# Patient Record
Sex: Female | Born: 1975
Health system: Southern US, Community
[De-identification: ages and names within clinical notes are randomized; demographics above are authoritative.]

## PROBLEM LIST (undated history)

## (undated) ENCOUNTER — Emergency Department (HOSPITAL_COMMUNITY): Admission: EM | Payer: No Typology Code available for payment source | Source: Home / Self Care

## (undated) DIAGNOSIS — I10 Essential (primary) hypertension: Secondary | ICD-10-CM

## (undated) DIAGNOSIS — E119 Type 2 diabetes mellitus without complications: Secondary | ICD-10-CM

## (undated) DIAGNOSIS — J449 Chronic obstructive pulmonary disease, unspecified: Secondary | ICD-10-CM

## (undated) DIAGNOSIS — K219 Gastro-esophageal reflux disease without esophagitis: Secondary | ICD-10-CM

## (undated) DIAGNOSIS — F419 Anxiety disorder, unspecified: Secondary | ICD-10-CM

## (undated) DIAGNOSIS — F32A Depression, unspecified: Secondary | ICD-10-CM

## (undated) DIAGNOSIS — F329 Major depressive disorder, single episode, unspecified: Secondary | ICD-10-CM

## (undated) HISTORY — PX: TUBAL LIGATION: SHX77

---

## 1997-04-22 ENCOUNTER — Ambulatory Visit (HOSPITAL_COMMUNITY): Admission: RE | Admit: 1997-04-22 | Discharge: 1997-04-22 | Payer: Self-pay | Admitting: *Deleted

## 1997-04-23 ENCOUNTER — Inpatient Hospital Stay (HOSPITAL_COMMUNITY): Admission: AD | Admit: 1997-04-23 | Discharge: 1997-04-23 | Payer: Self-pay | Admitting: *Deleted

## 1997-04-28 ENCOUNTER — Inpatient Hospital Stay (HOSPITAL_COMMUNITY): Admission: AD | Admit: 1997-04-28 | Discharge: 1997-04-28 | Payer: Self-pay | Admitting: *Deleted

## 1997-06-11 ENCOUNTER — Ambulatory Visit (HOSPITAL_COMMUNITY): Admission: RE | Admit: 1997-06-11 | Discharge: 1997-06-11 | Payer: Self-pay | Admitting: *Deleted

## 1997-10-09 ENCOUNTER — Inpatient Hospital Stay (HOSPITAL_COMMUNITY): Admission: AD | Admit: 1997-10-09 | Discharge: 1997-10-11 | Payer: Self-pay | Admitting: *Deleted

## 1998-03-11 ENCOUNTER — Emergency Department (HOSPITAL_COMMUNITY): Admission: EM | Admit: 1998-03-11 | Discharge: 1998-03-11 | Payer: Self-pay | Admitting: Emergency Medicine

## 1998-12-07 ENCOUNTER — Inpatient Hospital Stay (HOSPITAL_COMMUNITY): Admission: AD | Admit: 1998-12-07 | Discharge: 1998-12-07 | Payer: Self-pay | Admitting: Obstetrics

## 1998-12-08 ENCOUNTER — Inpatient Hospital Stay (HOSPITAL_COMMUNITY): Admission: RE | Admit: 1998-12-08 | Discharge: 1998-12-08 | Payer: Self-pay | Admitting: Obstetrics

## 1998-12-15 ENCOUNTER — Inpatient Hospital Stay (HOSPITAL_COMMUNITY): Admission: RE | Admit: 1998-12-15 | Discharge: 1998-12-15 | Payer: Self-pay | Admitting: *Deleted

## 1999-01-02 ENCOUNTER — Inpatient Hospital Stay (HOSPITAL_COMMUNITY): Admission: AD | Admit: 1999-01-02 | Discharge: 1999-01-02 | Payer: Self-pay | Admitting: Obstetrics

## 1999-04-01 ENCOUNTER — Ambulatory Visit (HOSPITAL_COMMUNITY): Admission: RE | Admit: 1999-04-01 | Discharge: 1999-04-01 | Payer: Self-pay | Admitting: *Deleted

## 1999-05-19 ENCOUNTER — Ambulatory Visit (HOSPITAL_COMMUNITY): Admission: RE | Admit: 1999-05-19 | Discharge: 1999-05-19 | Payer: Self-pay | Admitting: *Deleted

## 1999-05-29 ENCOUNTER — Inpatient Hospital Stay (HOSPITAL_COMMUNITY): Admission: AD | Admit: 1999-05-29 | Discharge: 1999-05-29 | Payer: Self-pay | Admitting: *Deleted

## 1999-06-05 ENCOUNTER — Encounter (INDEPENDENT_AMBULATORY_CARE_PROVIDER_SITE_OTHER): Payer: Self-pay | Admitting: Specialist

## 1999-06-05 ENCOUNTER — Inpatient Hospital Stay (HOSPITAL_COMMUNITY): Admission: AD | Admit: 1999-06-05 | Discharge: 1999-06-08 | Payer: Self-pay | Admitting: *Deleted

## 1999-06-10 ENCOUNTER — Inpatient Hospital Stay (HOSPITAL_COMMUNITY): Admission: EM | Admit: 1999-06-10 | Discharge: 1999-06-10 | Payer: Self-pay | Admitting: Obstetrics & Gynecology

## 1999-06-14 ENCOUNTER — Inpatient Hospital Stay (HOSPITAL_COMMUNITY): Admission: AD | Admit: 1999-06-14 | Discharge: 1999-06-14 | Payer: Self-pay | Admitting: Obstetrics

## 1999-09-27 ENCOUNTER — Inpatient Hospital Stay (HOSPITAL_COMMUNITY): Admission: EM | Admit: 1999-09-27 | Discharge: 1999-10-01 | Payer: Self-pay | Admitting: Obstetrics

## 1999-09-27 ENCOUNTER — Encounter: Payer: Self-pay | Admitting: Obstetrics

## 2000-03-21 ENCOUNTER — Inpatient Hospital Stay (HOSPITAL_COMMUNITY): Admission: AD | Admit: 2000-03-21 | Discharge: 2000-03-27 | Payer: Self-pay | Admitting: *Deleted

## 2000-03-21 ENCOUNTER — Encounter: Payer: Self-pay | Admitting: *Deleted

## 2000-06-14 ENCOUNTER — Emergency Department (HOSPITAL_COMMUNITY): Admission: EM | Admit: 2000-06-14 | Discharge: 2000-06-14 | Payer: Self-pay | Admitting: Emergency Medicine

## 2000-06-16 ENCOUNTER — Emergency Department (HOSPITAL_COMMUNITY): Admission: EM | Admit: 2000-06-16 | Discharge: 2000-06-16 | Payer: Self-pay | Admitting: Emergency Medicine

## 2000-06-26 ENCOUNTER — Emergency Department (HOSPITAL_COMMUNITY): Admission: EM | Admit: 2000-06-26 | Discharge: 2000-06-26 | Payer: Self-pay | Admitting: Emergency Medicine

## 2000-06-28 ENCOUNTER — Emergency Department (HOSPITAL_COMMUNITY): Admission: EM | Admit: 2000-06-28 | Discharge: 2000-06-28 | Payer: Self-pay | Admitting: Emergency Medicine

## 2000-10-05 ENCOUNTER — Inpatient Hospital Stay (HOSPITAL_COMMUNITY): Admission: AD | Admit: 2000-10-05 | Discharge: 2000-10-05 | Payer: Self-pay | Admitting: Obstetrics & Gynecology

## 2000-10-07 ENCOUNTER — Inpatient Hospital Stay (HOSPITAL_COMMUNITY): Admission: AD | Admit: 2000-10-07 | Discharge: 2000-10-07 | Payer: Self-pay | Admitting: *Deleted

## 2001-03-04 ENCOUNTER — Emergency Department (HOSPITAL_COMMUNITY): Admission: EM | Admit: 2001-03-04 | Discharge: 2001-03-04 | Payer: Self-pay | Admitting: Emergency Medicine

## 2001-06-19 ENCOUNTER — Emergency Department (HOSPITAL_COMMUNITY): Admission: EM | Admit: 2001-06-19 | Discharge: 2001-06-19 | Payer: Self-pay | Admitting: Emergency Medicine

## 2001-07-01 ENCOUNTER — Emergency Department (HOSPITAL_COMMUNITY): Admission: EM | Admit: 2001-07-01 | Discharge: 2001-07-01 | Payer: Self-pay | Admitting: Emergency Medicine

## 2001-08-27 ENCOUNTER — Emergency Department (HOSPITAL_COMMUNITY): Admission: EM | Admit: 2001-08-27 | Discharge: 2001-08-27 | Payer: Self-pay | Admitting: Emergency Medicine

## 2001-08-29 ENCOUNTER — Emergency Department (HOSPITAL_COMMUNITY): Admission: EM | Admit: 2001-08-29 | Discharge: 2001-08-29 | Payer: Self-pay | Admitting: Emergency Medicine

## 2001-09-22 ENCOUNTER — Emergency Department (HOSPITAL_COMMUNITY): Admission: EM | Admit: 2001-09-22 | Discharge: 2001-09-22 | Payer: Self-pay | Admitting: Emergency Medicine

## 2001-09-22 ENCOUNTER — Encounter: Payer: Self-pay | Admitting: Emergency Medicine

## 2001-10-28 ENCOUNTER — Encounter: Payer: Self-pay | Admitting: Emergency Medicine

## 2001-10-28 ENCOUNTER — Emergency Department (HOSPITAL_COMMUNITY): Admission: EM | Admit: 2001-10-28 | Discharge: 2001-10-28 | Payer: Self-pay | Admitting: Emergency Medicine

## 2001-12-10 ENCOUNTER — Emergency Department (HOSPITAL_COMMUNITY): Admission: EM | Admit: 2001-12-10 | Discharge: 2001-12-10 | Payer: Self-pay | Admitting: Emergency Medicine

## 2001-12-10 ENCOUNTER — Encounter: Payer: Self-pay | Admitting: Emergency Medicine

## 2002-01-19 ENCOUNTER — Emergency Department (HOSPITAL_COMMUNITY): Admission: EM | Admit: 2002-01-19 | Discharge: 2002-01-19 | Payer: Self-pay | Admitting: Emergency Medicine

## 2002-02-18 ENCOUNTER — Emergency Department (HOSPITAL_COMMUNITY): Admission: EM | Admit: 2002-02-18 | Discharge: 2002-02-18 | Payer: Self-pay | Admitting: Emergency Medicine

## 2002-02-28 ENCOUNTER — Inpatient Hospital Stay (HOSPITAL_COMMUNITY): Admission: AD | Admit: 2002-02-28 | Discharge: 2002-02-28 | Payer: Self-pay | Admitting: *Deleted

## 2002-03-12 ENCOUNTER — Encounter: Admission: RE | Admit: 2002-03-12 | Discharge: 2002-03-12 | Payer: Self-pay | Admitting: Family Medicine

## 2002-03-30 ENCOUNTER — Encounter: Payer: Self-pay | Admitting: *Deleted

## 2002-03-30 ENCOUNTER — Inpatient Hospital Stay (HOSPITAL_COMMUNITY): Admission: AD | Admit: 2002-03-30 | Discharge: 2002-03-30 | Payer: Self-pay | Admitting: *Deleted

## 2002-05-03 ENCOUNTER — Ambulatory Visit (HOSPITAL_COMMUNITY): Admission: RE | Admit: 2002-05-03 | Discharge: 2002-05-03 | Payer: Self-pay | Admitting: *Deleted

## 2002-05-06 ENCOUNTER — Inpatient Hospital Stay (HOSPITAL_COMMUNITY): Admission: AD | Admit: 2002-05-06 | Discharge: 2002-05-06 | Payer: Self-pay | Admitting: *Deleted

## 2002-05-30 ENCOUNTER — Encounter: Admission: RE | Admit: 2002-05-30 | Discharge: 2002-05-30 | Payer: Self-pay | Admitting: *Deleted

## 2002-05-30 ENCOUNTER — Ambulatory Visit (HOSPITAL_COMMUNITY): Admission: RE | Admit: 2002-05-30 | Discharge: 2002-05-30 | Payer: Self-pay | Admitting: *Deleted

## 2002-06-06 ENCOUNTER — Encounter: Admission: RE | Admit: 2002-06-06 | Discharge: 2002-06-06 | Payer: Self-pay | Admitting: *Deleted

## 2002-06-07 ENCOUNTER — Inpatient Hospital Stay (HOSPITAL_COMMUNITY): Admission: AD | Admit: 2002-06-07 | Discharge: 2002-06-07 | Payer: Self-pay | Admitting: Obstetrics and Gynecology

## 2002-06-12 ENCOUNTER — Inpatient Hospital Stay (HOSPITAL_COMMUNITY): Admission: AD | Admit: 2002-06-12 | Discharge: 2002-06-12 | Payer: Self-pay | Admitting: Obstetrics and Gynecology

## 2002-06-14 ENCOUNTER — Encounter: Payer: Self-pay | Admitting: Family Medicine

## 2002-06-14 ENCOUNTER — Inpatient Hospital Stay (HOSPITAL_COMMUNITY): Admission: AD | Admit: 2002-06-14 | Discharge: 2002-06-16 | Payer: Self-pay | Admitting: Family Medicine

## 2002-06-27 ENCOUNTER — Encounter: Admission: RE | Admit: 2002-06-27 | Discharge: 2002-06-27 | Payer: Self-pay | Admitting: *Deleted

## 2002-07-08 ENCOUNTER — Emergency Department (HOSPITAL_COMMUNITY): Admission: EM | Admit: 2002-07-08 | Discharge: 2002-07-08 | Payer: Self-pay | Admitting: Emergency Medicine

## 2002-07-18 ENCOUNTER — Encounter: Admission: RE | Admit: 2002-07-18 | Discharge: 2002-07-18 | Payer: Self-pay | Admitting: Family Medicine

## 2002-07-24 ENCOUNTER — Inpatient Hospital Stay (HOSPITAL_COMMUNITY): Admission: AD | Admit: 2002-07-24 | Discharge: 2002-07-24 | Payer: Self-pay | Admitting: Obstetrics and Gynecology

## 2002-07-25 ENCOUNTER — Ambulatory Visit (HOSPITAL_COMMUNITY): Admission: RE | Admit: 2002-07-25 | Discharge: 2002-07-25 | Payer: Self-pay | Admitting: *Deleted

## 2002-08-08 ENCOUNTER — Encounter: Admission: RE | Admit: 2002-08-08 | Discharge: 2002-08-08 | Payer: Self-pay | Admitting: Family Medicine

## 2002-08-27 ENCOUNTER — Inpatient Hospital Stay (HOSPITAL_COMMUNITY): Admission: AD | Admit: 2002-08-27 | Discharge: 2002-08-27 | Payer: Self-pay | Admitting: *Deleted

## 2002-08-28 ENCOUNTER — Encounter: Admission: RE | Admit: 2002-08-28 | Discharge: 2002-08-28 | Payer: Self-pay | Admitting: *Deleted

## 2002-09-01 ENCOUNTER — Inpatient Hospital Stay (HOSPITAL_COMMUNITY): Admission: AD | Admit: 2002-09-01 | Discharge: 2002-09-01 | Payer: Self-pay | Admitting: Obstetrics & Gynecology

## 2002-09-11 ENCOUNTER — Encounter: Admission: RE | Admit: 2002-09-11 | Discharge: 2002-09-11 | Payer: Self-pay | Admitting: *Deleted

## 2002-09-18 ENCOUNTER — Encounter: Admission: RE | Admit: 2002-09-18 | Discharge: 2002-09-18 | Payer: Self-pay | Admitting: *Deleted

## 2002-09-18 ENCOUNTER — Inpatient Hospital Stay (HOSPITAL_COMMUNITY): Admission: AD | Admit: 2002-09-18 | Discharge: 2002-09-18 | Payer: Self-pay | Admitting: Obstetrics and Gynecology

## 2002-09-21 ENCOUNTER — Encounter (INDEPENDENT_AMBULATORY_CARE_PROVIDER_SITE_OTHER): Payer: Self-pay | Admitting: Specialist

## 2002-09-21 ENCOUNTER — Inpatient Hospital Stay (HOSPITAL_COMMUNITY): Admission: AD | Admit: 2002-09-21 | Discharge: 2002-09-24 | Payer: Self-pay | Admitting: Obstetrics and Gynecology

## 2002-09-25 ENCOUNTER — Inpatient Hospital Stay (HOSPITAL_COMMUNITY): Admission: AD | Admit: 2002-09-25 | Discharge: 2002-09-25 | Payer: Self-pay | Admitting: *Deleted

## 2002-09-25 ENCOUNTER — Inpatient Hospital Stay (HOSPITAL_COMMUNITY): Admission: AD | Admit: 2002-09-25 | Discharge: 2002-09-25 | Payer: Self-pay | Admitting: Obstetrics and Gynecology

## 2002-09-26 ENCOUNTER — Inpatient Hospital Stay (HOSPITAL_COMMUNITY): Admission: AD | Admit: 2002-09-26 | Discharge: 2002-09-26 | Payer: Self-pay | Admitting: Obstetrics & Gynecology

## 2002-09-28 ENCOUNTER — Encounter: Payer: Self-pay | Admitting: *Deleted

## 2002-09-28 ENCOUNTER — Inpatient Hospital Stay (HOSPITAL_COMMUNITY): Admission: AD | Admit: 2002-09-28 | Discharge: 2002-10-02 | Payer: Self-pay | Admitting: *Deleted

## 2002-10-05 ENCOUNTER — Inpatient Hospital Stay (HOSPITAL_COMMUNITY): Admission: AD | Admit: 2002-10-05 | Discharge: 2002-10-05 | Payer: Self-pay | Admitting: Obstetrics & Gynecology

## 2002-11-02 ENCOUNTER — Encounter: Payer: Self-pay | Admitting: General Surgery

## 2002-11-02 ENCOUNTER — Emergency Department (HOSPITAL_COMMUNITY): Admission: EM | Admit: 2002-11-02 | Discharge: 2002-11-02 | Payer: Self-pay | Admitting: Emergency Medicine

## 2002-11-02 ENCOUNTER — Encounter: Payer: Self-pay | Admitting: *Deleted

## 2002-12-16 ENCOUNTER — Emergency Department (HOSPITAL_COMMUNITY): Admission: AD | Admit: 2002-12-16 | Discharge: 2002-12-16 | Payer: Self-pay | Admitting: Family Medicine

## 2003-04-23 ENCOUNTER — Emergency Department (HOSPITAL_COMMUNITY): Admission: EM | Admit: 2003-04-23 | Discharge: 2003-04-23 | Payer: Self-pay | Admitting: Family Medicine

## 2003-05-23 ENCOUNTER — Emergency Department (HOSPITAL_COMMUNITY): Admission: EM | Admit: 2003-05-23 | Discharge: 2003-05-23 | Payer: Self-pay | Admitting: *Deleted

## 2003-06-25 ENCOUNTER — Emergency Department (HOSPITAL_COMMUNITY): Admission: EM | Admit: 2003-06-25 | Discharge: 2003-06-25 | Payer: Self-pay | Admitting: Emergency Medicine

## 2003-07-09 ENCOUNTER — Inpatient Hospital Stay (HOSPITAL_COMMUNITY): Admission: AD | Admit: 2003-07-09 | Discharge: 2003-07-09 | Payer: Self-pay | Admitting: Obstetrics and Gynecology

## 2003-07-30 ENCOUNTER — Ambulatory Visit (HOSPITAL_COMMUNITY): Admission: RE | Admit: 2003-07-30 | Discharge: 2003-07-30 | Payer: Self-pay | Admitting: Obstetrics

## 2003-08-09 ENCOUNTER — Emergency Department (HOSPITAL_COMMUNITY): Admission: EM | Admit: 2003-08-09 | Discharge: 2003-08-09 | Payer: Self-pay | Admitting: Family Medicine

## 2003-08-12 ENCOUNTER — Emergency Department (HOSPITAL_COMMUNITY): Admission: EM | Admit: 2003-08-12 | Discharge: 2003-08-12 | Payer: Self-pay | Admitting: Family Medicine

## 2003-08-20 ENCOUNTER — Emergency Department (HOSPITAL_COMMUNITY): Admission: EM | Admit: 2003-08-20 | Discharge: 2003-08-20 | Payer: Self-pay | Admitting: Family Medicine

## 2003-09-01 ENCOUNTER — Inpatient Hospital Stay (HOSPITAL_COMMUNITY): Admission: AD | Admit: 2003-09-01 | Discharge: 2003-09-02 | Payer: Self-pay | Admitting: Obstetrics

## 2003-10-13 ENCOUNTER — Encounter (INDEPENDENT_AMBULATORY_CARE_PROVIDER_SITE_OTHER): Payer: Self-pay | Admitting: *Deleted

## 2003-10-23 ENCOUNTER — Encounter: Admission: RE | Admit: 2003-10-23 | Discharge: 2003-10-23 | Payer: Self-pay | Admitting: Sports Medicine

## 2003-10-23 ENCOUNTER — Other Ambulatory Visit: Admission: RE | Admit: 2003-10-23 | Discharge: 2003-10-23 | Payer: Self-pay | Admitting: Family Medicine

## 2003-11-20 ENCOUNTER — Ambulatory Visit: Payer: Self-pay | Admitting: Family Medicine

## 2003-11-27 ENCOUNTER — Emergency Department (HOSPITAL_COMMUNITY): Admission: EM | Admit: 2003-11-27 | Discharge: 2003-11-27 | Payer: Self-pay | Admitting: Family Medicine

## 2004-01-28 ENCOUNTER — Emergency Department (HOSPITAL_COMMUNITY): Admission: EM | Admit: 2004-01-28 | Discharge: 2004-01-28 | Payer: Self-pay | Admitting: Family Medicine

## 2004-02-16 ENCOUNTER — Ambulatory Visit: Payer: Self-pay | Admitting: Family Medicine

## 2004-04-05 ENCOUNTER — Inpatient Hospital Stay (HOSPITAL_COMMUNITY): Admission: AD | Admit: 2004-04-05 | Discharge: 2004-04-05 | Payer: Self-pay | Admitting: Obstetrics & Gynecology

## 2004-05-24 ENCOUNTER — Ambulatory Visit: Payer: Self-pay | Admitting: Family Medicine

## 2004-07-02 ENCOUNTER — Ambulatory Visit: Payer: Self-pay | Admitting: Family Medicine

## 2004-07-21 ENCOUNTER — Emergency Department (HOSPITAL_COMMUNITY): Admission: EM | Admit: 2004-07-21 | Discharge: 2004-07-21 | Payer: Self-pay | Admitting: Emergency Medicine

## 2004-09-02 ENCOUNTER — Emergency Department (HOSPITAL_COMMUNITY): Admission: EM | Admit: 2004-09-02 | Discharge: 2004-09-02 | Payer: Self-pay | Admitting: Emergency Medicine

## 2004-10-06 ENCOUNTER — Ambulatory Visit: Payer: Self-pay | Admitting: Sports Medicine

## 2004-10-11 ENCOUNTER — Emergency Department (HOSPITAL_COMMUNITY): Admission: EM | Admit: 2004-10-11 | Discharge: 2004-10-11 | Payer: Self-pay | Admitting: Family Medicine

## 2004-10-14 ENCOUNTER — Emergency Department (HOSPITAL_COMMUNITY): Admission: EM | Admit: 2004-10-14 | Discharge: 2004-10-14 | Payer: Self-pay | Admitting: Emergency Medicine

## 2004-10-28 ENCOUNTER — Emergency Department (HOSPITAL_COMMUNITY): Admission: EM | Admit: 2004-10-28 | Discharge: 2004-10-28 | Payer: Self-pay | Admitting: Family Medicine

## 2004-12-02 ENCOUNTER — Ambulatory Visit: Payer: Self-pay | Admitting: Family Medicine

## 2004-12-06 ENCOUNTER — Ambulatory Visit: Payer: Self-pay | Admitting: Family Medicine

## 2005-01-14 ENCOUNTER — Ambulatory Visit: Payer: Self-pay | Admitting: Family Medicine

## 2005-02-21 ENCOUNTER — Ambulatory Visit: Payer: Self-pay | Admitting: Sports Medicine

## 2005-04-01 ENCOUNTER — Ambulatory Visit: Payer: Self-pay | Admitting: Family Medicine

## 2005-05-02 ENCOUNTER — Ambulatory Visit: Payer: Self-pay | Admitting: Family Medicine

## 2005-05-04 ENCOUNTER — Ambulatory Visit: Payer: Self-pay | Admitting: Sports Medicine

## 2005-06-24 ENCOUNTER — Emergency Department (HOSPITAL_COMMUNITY): Admission: EM | Admit: 2005-06-24 | Discharge: 2005-06-24 | Payer: Self-pay | Admitting: Family Medicine

## 2005-07-26 ENCOUNTER — Ambulatory Visit: Payer: Self-pay | Admitting: Sports Medicine

## 2005-07-28 ENCOUNTER — Inpatient Hospital Stay (HOSPITAL_COMMUNITY): Admission: AD | Admit: 2005-07-28 | Discharge: 2005-07-28 | Payer: Self-pay | Admitting: Family Medicine

## 2005-08-10 ENCOUNTER — Ambulatory Visit: Payer: Self-pay | Admitting: Family Medicine

## 2005-08-26 ENCOUNTER — Emergency Department (HOSPITAL_COMMUNITY): Admission: EM | Admit: 2005-08-26 | Discharge: 2005-08-26 | Payer: Self-pay | Admitting: Family Medicine

## 2005-10-20 ENCOUNTER — Emergency Department (HOSPITAL_COMMUNITY): Admission: EM | Admit: 2005-10-20 | Discharge: 2005-10-20 | Payer: Self-pay | Admitting: Family Medicine

## 2005-11-22 ENCOUNTER — Ambulatory Visit: Payer: Self-pay | Admitting: Family Medicine

## 2005-12-02 ENCOUNTER — Ambulatory Visit: Payer: Self-pay | Admitting: Family Medicine

## 2005-12-28 ENCOUNTER — Inpatient Hospital Stay (HOSPITAL_COMMUNITY): Admission: AD | Admit: 2005-12-28 | Discharge: 2005-12-28 | Payer: Self-pay | Admitting: Gynecology

## 2006-05-02 ENCOUNTER — Ambulatory Visit: Payer: Self-pay | Admitting: Family Medicine

## 2006-05-11 DIAGNOSIS — N946 Dysmenorrhea, unspecified: Secondary | ICD-10-CM | POA: Insufficient documentation

## 2006-05-11 DIAGNOSIS — K219 Gastro-esophageal reflux disease without esophagitis: Secondary | ICD-10-CM

## 2006-05-12 ENCOUNTER — Encounter (INDEPENDENT_AMBULATORY_CARE_PROVIDER_SITE_OTHER): Payer: Self-pay | Admitting: *Deleted

## 2006-05-20 ENCOUNTER — Emergency Department (HOSPITAL_COMMUNITY): Admission: EM | Admit: 2006-05-20 | Discharge: 2006-05-20 | Payer: Self-pay | Admitting: Emergency Medicine

## 2006-06-09 ENCOUNTER — Emergency Department (HOSPITAL_COMMUNITY): Admission: EM | Admit: 2006-06-09 | Discharge: 2006-06-09 | Payer: Self-pay | Admitting: Emergency Medicine

## 2006-07-17 ENCOUNTER — Emergency Department (HOSPITAL_COMMUNITY): Admission: EM | Admit: 2006-07-17 | Discharge: 2006-07-17 | Payer: Self-pay | Admitting: Emergency Medicine

## 2006-10-06 ENCOUNTER — Telehealth: Payer: Self-pay | Admitting: *Deleted

## 2006-10-09 ENCOUNTER — Ambulatory Visit: Payer: Self-pay | Admitting: Family Medicine

## 2006-10-09 DIAGNOSIS — G43909 Migraine, unspecified, not intractable, without status migrainosus: Secondary | ICD-10-CM | POA: Insufficient documentation

## 2006-10-15 ENCOUNTER — Emergency Department (HOSPITAL_COMMUNITY): Admission: EM | Admit: 2006-10-15 | Discharge: 2006-10-15 | Payer: Self-pay | Admitting: Emergency Medicine

## 2006-11-08 ENCOUNTER — Encounter: Payer: Self-pay | Admitting: Family Medicine

## 2006-11-08 ENCOUNTER — Ambulatory Visit: Payer: Self-pay | Admitting: Family Medicine

## 2006-11-08 DIAGNOSIS — D259 Leiomyoma of uterus, unspecified: Secondary | ICD-10-CM | POA: Insufficient documentation

## 2006-11-08 DIAGNOSIS — R5381 Other malaise: Secondary | ICD-10-CM | POA: Insufficient documentation

## 2006-11-08 DIAGNOSIS — R5383 Other fatigue: Secondary | ICD-10-CM

## 2006-11-08 LAB — CONVERTED CEMR LAB
Chlamydia, DNA Probe: NEGATIVE
RBC: 4.19 M/uL (ref 3.87–5.11)
WBC: 8.2 10*3/uL (ref 4.0–10.5)

## 2006-11-09 ENCOUNTER — Telehealth: Payer: Self-pay | Admitting: *Deleted

## 2006-11-10 ENCOUNTER — Emergency Department (HOSPITAL_COMMUNITY): Admission: EM | Admit: 2006-11-10 | Discharge: 2006-11-10 | Payer: Self-pay | Admitting: Family Medicine

## 2006-11-13 ENCOUNTER — Emergency Department (HOSPITAL_COMMUNITY): Admission: EM | Admit: 2006-11-13 | Discharge: 2006-11-13 | Payer: Self-pay | Admitting: Emergency Medicine

## 2006-11-15 ENCOUNTER — Encounter (INDEPENDENT_AMBULATORY_CARE_PROVIDER_SITE_OTHER): Payer: Self-pay | Admitting: *Deleted

## 2006-11-15 ENCOUNTER — Ambulatory Visit: Payer: Self-pay | Admitting: Family Medicine

## 2006-11-15 ENCOUNTER — Telehealth (INDEPENDENT_AMBULATORY_CARE_PROVIDER_SITE_OTHER): Payer: Self-pay | Admitting: *Deleted

## 2006-11-15 DIAGNOSIS — K5900 Constipation, unspecified: Secondary | ICD-10-CM | POA: Insufficient documentation

## 2006-11-15 DIAGNOSIS — F411 Generalized anxiety disorder: Secondary | ICD-10-CM | POA: Insufficient documentation

## 2006-11-15 DIAGNOSIS — R42 Dizziness and giddiness: Secondary | ICD-10-CM | POA: Insufficient documentation

## 2006-11-20 ENCOUNTER — Ambulatory Visit (HOSPITAL_COMMUNITY): Admission: RE | Admit: 2006-11-20 | Discharge: 2006-11-20 | Payer: Self-pay | Admitting: Family Medicine

## 2006-11-21 ENCOUNTER — Encounter (INDEPENDENT_AMBULATORY_CARE_PROVIDER_SITE_OTHER): Payer: Self-pay | Admitting: *Deleted

## 2006-11-21 ENCOUNTER — Telehealth (INDEPENDENT_AMBULATORY_CARE_PROVIDER_SITE_OTHER): Payer: Self-pay | Admitting: *Deleted

## 2006-11-21 LAB — CONVERTED CEMR LAB
Eosinophils Absolute: 0.1 10*3/uL (ref 0.0–0.7)
Eosinophils Relative: 2 % (ref 0–5)
HCT: 39.2 % (ref 36.0–46.0)
Hemoglobin: 12.9 g/dL (ref 12.0–15.0)
Lymphs Abs: 3.1 10*3/uL (ref 0.7–3.3)
MCV: 97.3 fL (ref 78.0–100.0)
Monocytes Relative: 8 % (ref 3–11)
RBC: 4.03 M/uL (ref 3.87–5.11)
WBC: 6.8 10*3/uL (ref 4.0–10.5)

## 2006-11-23 ENCOUNTER — Ambulatory Visit: Payer: Self-pay | Admitting: Family Medicine

## 2006-12-01 ENCOUNTER — Telehealth (INDEPENDENT_AMBULATORY_CARE_PROVIDER_SITE_OTHER): Payer: Self-pay | Admitting: Family Medicine

## 2006-12-04 ENCOUNTER — Encounter: Payer: Self-pay | Admitting: Family Medicine

## 2006-12-18 ENCOUNTER — Ambulatory Visit: Payer: Self-pay | Admitting: Sports Medicine

## 2007-01-19 ENCOUNTER — Ambulatory Visit: Payer: Self-pay | Admitting: Family Medicine

## 2007-01-19 LAB — CONVERTED CEMR LAB
Mucus, UA: 0
Protein, U semiquant: NEGATIVE
Specific Gravity, Urine: 1.015
Urobilinogen, UA: NEGATIVE
WBC Urine, dipstick: NEGATIVE

## 2007-01-23 ENCOUNTER — Telehealth (INDEPENDENT_AMBULATORY_CARE_PROVIDER_SITE_OTHER): Payer: Self-pay | Admitting: *Deleted

## 2007-01-26 ENCOUNTER — Encounter: Payer: Self-pay | Admitting: *Deleted

## 2007-01-30 ENCOUNTER — Encounter: Payer: Self-pay | Admitting: *Deleted

## 2007-02-06 ENCOUNTER — Encounter: Admission: RE | Admit: 2007-02-06 | Discharge: 2007-03-24 | Payer: Self-pay | Admitting: Family Medicine

## 2007-02-06 ENCOUNTER — Telehealth: Payer: Self-pay | Admitting: Family Medicine

## 2007-02-20 ENCOUNTER — Encounter: Payer: Self-pay | Admitting: *Deleted

## 2007-03-21 ENCOUNTER — Ambulatory Visit: Payer: Self-pay | Admitting: Family Medicine

## 2007-03-23 ENCOUNTER — Ambulatory Visit: Payer: Self-pay | Admitting: Family Medicine

## 2007-07-09 ENCOUNTER — Ambulatory Visit: Payer: Self-pay | Admitting: Family Medicine

## 2007-07-09 ENCOUNTER — Encounter: Payer: Self-pay | Admitting: Family Medicine

## 2007-07-16 ENCOUNTER — Encounter: Payer: Self-pay | Admitting: *Deleted

## 2007-07-16 ENCOUNTER — Ambulatory Visit: Payer: Self-pay | Admitting: Family Medicine

## 2007-07-16 ENCOUNTER — Encounter (INDEPENDENT_AMBULATORY_CARE_PROVIDER_SITE_OTHER): Payer: Self-pay | Admitting: Family Medicine

## 2007-07-17 ENCOUNTER — Telehealth (INDEPENDENT_AMBULATORY_CARE_PROVIDER_SITE_OTHER): Payer: Self-pay | Admitting: Family Medicine

## 2007-07-17 ENCOUNTER — Encounter: Payer: Self-pay | Admitting: Family Medicine

## 2007-07-20 ENCOUNTER — Ambulatory Visit: Payer: Self-pay | Admitting: Family Medicine

## 2007-08-17 ENCOUNTER — Ambulatory Visit: Payer: Self-pay | Admitting: Family Medicine

## 2007-08-17 DIAGNOSIS — F339 Major depressive disorder, recurrent, unspecified: Secondary | ICD-10-CM | POA: Insufficient documentation

## 2007-08-20 ENCOUNTER — Telehealth: Payer: Self-pay | Admitting: Family Medicine

## 2007-08-31 ENCOUNTER — Ambulatory Visit: Payer: Self-pay | Admitting: Family Medicine

## 2007-08-31 ENCOUNTER — Encounter: Payer: Self-pay | Admitting: Family Medicine

## 2007-09-04 ENCOUNTER — Telehealth: Payer: Self-pay | Admitting: Family Medicine

## 2007-09-11 ENCOUNTER — Telehealth: Payer: Self-pay | Admitting: Family Medicine

## 2007-09-17 ENCOUNTER — Encounter: Payer: Self-pay | Admitting: *Deleted

## 2007-09-25 ENCOUNTER — Encounter: Payer: Self-pay | Admitting: Family Medicine

## 2007-09-27 ENCOUNTER — Emergency Department (HOSPITAL_COMMUNITY): Admission: EM | Admit: 2007-09-27 | Discharge: 2007-09-27 | Payer: Self-pay | Admitting: Family Medicine

## 2007-09-28 ENCOUNTER — Encounter: Payer: Self-pay | Admitting: Family Medicine

## 2007-09-28 ENCOUNTER — Ambulatory Visit: Payer: Self-pay | Admitting: Family Medicine

## 2007-09-28 LAB — CONVERTED CEMR LAB
Bilirubin Urine: NEGATIVE
Glucose, Urine, Semiquant: NEGATIVE
Urobilinogen, UA: 0.2

## 2007-10-06 ENCOUNTER — Emergency Department (HOSPITAL_COMMUNITY): Admission: EM | Admit: 2007-10-06 | Discharge: 2007-10-06 | Payer: Self-pay | Admitting: Emergency Medicine

## 2007-10-08 ENCOUNTER — Encounter: Payer: Self-pay | Admitting: Family Medicine

## 2007-10-08 ENCOUNTER — Ambulatory Visit: Payer: Self-pay | Admitting: Family Medicine

## 2007-10-10 ENCOUNTER — Telehealth (INDEPENDENT_AMBULATORY_CARE_PROVIDER_SITE_OTHER): Payer: Self-pay | Admitting: *Deleted

## 2007-10-24 ENCOUNTER — Encounter (INDEPENDENT_AMBULATORY_CARE_PROVIDER_SITE_OTHER): Payer: Self-pay | Admitting: *Deleted

## 2007-11-06 ENCOUNTER — Encounter: Payer: Self-pay | Admitting: Family Medicine

## 2007-11-15 ENCOUNTER — Ambulatory Visit (HOSPITAL_COMMUNITY): Admission: RE | Admit: 2007-11-15 | Discharge: 2007-11-15 | Payer: Self-pay | Admitting: Family Medicine

## 2007-11-15 ENCOUNTER — Ambulatory Visit: Payer: Self-pay | Admitting: Family Medicine

## 2007-11-15 LAB — CONVERTED CEMR LAB
Glucose, Urine, Semiquant: NEGATIVE
Protein, U semiquant: NEGATIVE
Specific Gravity, Urine: 1.025
pH: 7

## 2007-11-16 ENCOUNTER — Telehealth: Payer: Self-pay | Admitting: *Deleted

## 2007-11-21 ENCOUNTER — Encounter: Payer: Self-pay | Admitting: Family Medicine

## 2007-11-21 ENCOUNTER — Emergency Department (HOSPITAL_COMMUNITY): Admission: EM | Admit: 2007-11-21 | Discharge: 2007-11-21 | Payer: Self-pay | Admitting: Emergency Medicine

## 2007-11-27 ENCOUNTER — Emergency Department (HOSPITAL_COMMUNITY): Admission: EM | Admit: 2007-11-27 | Discharge: 2007-11-27 | Payer: Self-pay | Admitting: Internal Medicine

## 2007-12-20 ENCOUNTER — Encounter: Payer: Self-pay | Admitting: *Deleted

## 2007-12-24 ENCOUNTER — Telehealth: Payer: Self-pay | Admitting: *Deleted

## 2007-12-25 ENCOUNTER — Telehealth: Payer: Self-pay | Admitting: *Deleted

## 2007-12-25 ENCOUNTER — Ambulatory Visit: Payer: Self-pay | Admitting: Family Medicine

## 2008-01-01 ENCOUNTER — Ambulatory Visit: Payer: Self-pay | Admitting: Family Medicine

## 2008-01-08 ENCOUNTER — Telehealth: Payer: Self-pay | Admitting: Family Medicine

## 2008-01-09 ENCOUNTER — Encounter (INDEPENDENT_AMBULATORY_CARE_PROVIDER_SITE_OTHER): Payer: Self-pay | Admitting: *Deleted

## 2008-01-17 ENCOUNTER — Other Ambulatory Visit: Admission: RE | Admit: 2008-01-17 | Discharge: 2008-01-17 | Payer: Self-pay | Admitting: Family Medicine

## 2008-01-17 ENCOUNTER — Ambulatory Visit: Payer: Self-pay | Admitting: Family Medicine

## 2008-01-17 ENCOUNTER — Encounter (INDEPENDENT_AMBULATORY_CARE_PROVIDER_SITE_OTHER): Payer: Self-pay | Admitting: Family Medicine

## 2008-01-17 ENCOUNTER — Encounter: Payer: Self-pay | Admitting: Family Medicine

## 2008-03-27 ENCOUNTER — Emergency Department (HOSPITAL_COMMUNITY): Admission: EM | Admit: 2008-03-27 | Discharge: 2008-03-27 | Payer: Self-pay | Admitting: Emergency Medicine

## 2008-04-09 ENCOUNTER — Emergency Department (HOSPITAL_COMMUNITY): Admission: EM | Admit: 2008-04-09 | Discharge: 2008-04-10 | Payer: Self-pay | Admitting: Emergency Medicine

## 2008-04-29 ENCOUNTER — Ambulatory Visit: Payer: Self-pay | Admitting: Family Medicine

## 2008-05-01 ENCOUNTER — Ambulatory Visit: Payer: Self-pay | Admitting: Family Medicine

## 2008-05-05 ENCOUNTER — Telehealth: Payer: Self-pay | Admitting: Family Medicine

## 2008-05-05 ENCOUNTER — Ambulatory Visit: Payer: Self-pay | Admitting: Family Medicine

## 2008-05-21 ENCOUNTER — Telehealth: Payer: Self-pay | Admitting: Family Medicine

## 2008-05-21 ENCOUNTER — Ambulatory Visit: Payer: Self-pay | Admitting: Family Medicine

## 2008-06-05 ENCOUNTER — Ambulatory Visit: Payer: Self-pay | Admitting: Family Medicine

## 2008-06-05 ENCOUNTER — Encounter: Payer: Self-pay | Admitting: Family Medicine

## 2008-06-05 ENCOUNTER — Ambulatory Visit (HOSPITAL_COMMUNITY): Admission: RE | Admit: 2008-06-05 | Discharge: 2008-06-05 | Payer: Self-pay | Admitting: Family Medicine

## 2008-07-02 ENCOUNTER — Telehealth: Payer: Self-pay | Admitting: Family Medicine

## 2008-07-03 ENCOUNTER — Telehealth: Payer: Self-pay | Admitting: Family Medicine

## 2008-07-03 ENCOUNTER — Encounter: Payer: Self-pay | Admitting: Family Medicine

## 2008-07-03 ENCOUNTER — Ambulatory Visit: Payer: Self-pay | Admitting: Family Medicine

## 2008-07-03 LAB — CONVERTED CEMR LAB

## 2008-07-04 ENCOUNTER — Encounter: Payer: Self-pay | Admitting: Family Medicine

## 2008-07-10 ENCOUNTER — Emergency Department (HOSPITAL_COMMUNITY): Admission: EM | Admit: 2008-07-10 | Discharge: 2008-07-10 | Payer: Self-pay | Admitting: Emergency Medicine

## 2008-07-11 ENCOUNTER — Telehealth: Payer: Self-pay | Admitting: Family Medicine

## 2008-07-14 ENCOUNTER — Ambulatory Visit: Payer: Self-pay | Admitting: Family Medicine

## 2008-08-09 ENCOUNTER — Emergency Department (HOSPITAL_COMMUNITY): Admission: EM | Admit: 2008-08-09 | Discharge: 2008-08-09 | Payer: Self-pay | Admitting: Emergency Medicine

## 2008-08-12 ENCOUNTER — Encounter: Payer: Self-pay | Admitting: Family Medicine

## 2008-08-12 ENCOUNTER — Ambulatory Visit: Payer: Self-pay | Admitting: Family Medicine

## 2008-08-12 DIAGNOSIS — H547 Unspecified visual loss: Secondary | ICD-10-CM

## 2008-08-13 ENCOUNTER — Telehealth: Payer: Self-pay | Admitting: *Deleted

## 2008-09-12 ENCOUNTER — Ambulatory Visit: Payer: Self-pay | Admitting: Family Medicine

## 2008-09-12 ENCOUNTER — Encounter: Payer: Self-pay | Admitting: Family Medicine

## 2008-09-17 ENCOUNTER — Encounter: Payer: Self-pay | Admitting: Family Medicine

## 2008-09-18 ENCOUNTER — Ambulatory Visit: Payer: Self-pay | Admitting: Family Medicine

## 2008-09-18 ENCOUNTER — Encounter: Payer: Self-pay | Admitting: Family Medicine

## 2008-09-18 DIAGNOSIS — A630 Anogenital (venereal) warts: Secondary | ICD-10-CM

## 2008-09-23 ENCOUNTER — Encounter: Payer: Self-pay | Admitting: Family Medicine

## 2008-10-07 ENCOUNTER — Emergency Department (HOSPITAL_COMMUNITY): Admission: EM | Admit: 2008-10-07 | Discharge: 2008-10-07 | Payer: Self-pay | Admitting: Emergency Medicine

## 2008-10-08 ENCOUNTER — Ambulatory Visit: Payer: Self-pay | Admitting: Family Medicine

## 2008-10-08 ENCOUNTER — Telehealth: Payer: Self-pay | Admitting: Family Medicine

## 2008-11-05 ENCOUNTER — Ambulatory Visit: Payer: Self-pay | Admitting: Family Medicine

## 2008-11-05 ENCOUNTER — Encounter: Payer: Self-pay | Admitting: Family Medicine

## 2008-11-05 DIAGNOSIS — F410 Panic disorder [episodic paroxysmal anxiety] without agoraphobia: Secondary | ICD-10-CM

## 2009-01-09 ENCOUNTER — Telehealth: Payer: Self-pay | Admitting: Family Medicine

## 2009-01-10 ENCOUNTER — Emergency Department (HOSPITAL_COMMUNITY): Admission: EM | Admit: 2009-01-10 | Discharge: 2009-01-10 | Payer: Self-pay | Admitting: Emergency Medicine

## 2009-01-26 ENCOUNTER — Telehealth: Payer: Self-pay | Admitting: Family Medicine

## 2009-02-28 ENCOUNTER — Emergency Department (HOSPITAL_COMMUNITY): Admission: EM | Admit: 2009-02-28 | Discharge: 2009-02-28 | Payer: Self-pay | Admitting: Emergency Medicine

## 2009-03-04 ENCOUNTER — Ambulatory Visit: Payer: Self-pay | Admitting: Family Medicine

## 2009-03-16 ENCOUNTER — Encounter (INDEPENDENT_AMBULATORY_CARE_PROVIDER_SITE_OTHER): Payer: Self-pay | Admitting: *Deleted

## 2009-03-16 DIAGNOSIS — F17200 Nicotine dependence, unspecified, uncomplicated: Secondary | ICD-10-CM

## 2009-04-13 ENCOUNTER — Emergency Department (HOSPITAL_COMMUNITY): Admission: EM | Admit: 2009-04-13 | Discharge: 2009-04-13 | Payer: Self-pay | Admitting: Emergency Medicine

## 2009-06-26 ENCOUNTER — Emergency Department (HOSPITAL_COMMUNITY): Admission: EM | Admit: 2009-06-26 | Discharge: 2009-06-26 | Payer: Self-pay | Admitting: Emergency Medicine

## 2009-07-21 ENCOUNTER — Emergency Department (HOSPITAL_COMMUNITY): Admission: EM | Admit: 2009-07-21 | Discharge: 2009-07-21 | Payer: Self-pay | Admitting: Emergency Medicine

## 2009-08-20 ENCOUNTER — Emergency Department (HOSPITAL_COMMUNITY): Admission: EM | Admit: 2009-08-20 | Discharge: 2009-08-20 | Payer: Self-pay | Admitting: Emergency Medicine

## 2009-10-28 ENCOUNTER — Emergency Department (HOSPITAL_COMMUNITY)
Admission: EM | Admit: 2009-10-28 | Discharge: 2009-10-28 | Payer: Self-pay | Source: Home / Self Care | Admitting: Emergency Medicine

## 2009-11-02 ENCOUNTER — Emergency Department (HOSPITAL_COMMUNITY): Admission: EM | Admit: 2009-11-02 | Discharge: 2009-11-02 | Payer: Self-pay | Admitting: Emergency Medicine

## 2010-01-10 ENCOUNTER — Emergency Department (HOSPITAL_COMMUNITY): Admission: EM | Admit: 2010-01-10 | Discharge: 2010-01-10 | Payer: Self-pay | Admitting: Emergency Medicine

## 2010-01-29 ENCOUNTER — Encounter: Payer: Self-pay | Admitting: Family Medicine

## 2010-01-29 ENCOUNTER — Encounter: Admission: RE | Admit: 2010-01-29 | Discharge: 2010-01-29 | Payer: Self-pay | Admitting: Family Medicine

## 2010-01-29 ENCOUNTER — Ambulatory Visit: Payer: Self-pay | Admitting: Family Medicine

## 2010-01-29 DIAGNOSIS — R05 Cough: Secondary | ICD-10-CM

## 2010-01-29 DIAGNOSIS — E663 Overweight: Secondary | ICD-10-CM | POA: Insufficient documentation

## 2010-01-29 LAB — CONVERTED CEMR LAB
ALT: 19 units/L (ref 0–35)
AST: 21 units/L (ref 0–37)
CO2: 26 meq/L (ref 19–32)
Calcium: 9 mg/dL (ref 8.4–10.5)
Chloride: 105 meq/L (ref 96–112)
Cholesterol: 165 mg/dL (ref 0–200)
HCT: 39.3 % (ref 36.0–46.0)
HDL: 69 mg/dL (ref >39)
Platelets: 274 10*3/uL (ref 150–400)
Potassium: 4.4 meq/L (ref 3.5–5.3)
RDW: 14.3 % (ref 11.5–15.5)
Sodium: 137 meq/L (ref 135–145)
TSH: 0.564 microintl units/mL (ref 0.350–4.500)
Total CHOL/HDL Ratio: 2.4
Total Protein: 6.8 g/dL (ref 6.0–8.3)
WBC: 7.6 10*3/uL (ref 4.0–10.5)

## 2010-02-01 ENCOUNTER — Encounter: Payer: Self-pay | Admitting: Family Medicine

## 2010-02-16 ENCOUNTER — Emergency Department (HOSPITAL_COMMUNITY)
Admission: EM | Admit: 2010-02-16 | Discharge: 2010-02-16 | Payer: Self-pay | Source: Home / Self Care | Admitting: Emergency Medicine

## 2010-02-25 ENCOUNTER — Ambulatory Visit: Payer: Self-pay | Admitting: Family Medicine

## 2010-02-25 ENCOUNTER — Encounter: Payer: Self-pay | Admitting: Family Medicine

## 2010-02-25 DIAGNOSIS — R209 Unspecified disturbances of skin sensation: Secondary | ICD-10-CM | POA: Insufficient documentation

## 2010-02-25 DIAGNOSIS — IMO0001 Reserved for inherently not codable concepts without codable children: Secondary | ICD-10-CM

## 2010-02-25 LAB — CONVERTED CEMR LAB
Anti Nuclear Antibody(ANA): NEGATIVE
Rhuematoid fact SerPl-aCnc: <20 intl units/mL (ref 0–20)
Vitamin B-12: 359 pg/mL (ref 211–911)

## 2010-02-26 ENCOUNTER — Encounter: Payer: Self-pay | Admitting: Family Medicine

## 2010-03-02 ENCOUNTER — Ambulatory Visit: Payer: Self-pay

## 2010-03-24 ENCOUNTER — Ambulatory Visit: Admit: 2010-03-24 | Payer: Self-pay

## 2010-04-13 NOTE — Letter (Signed)
Summary: Lipid Letter  [facility]  [address]   [place], [place] [address]   Phone: [phone]  Fax: [phone]    02/01/2010  Gina Shelton [address] [place], [place]  [address]  Dear Gina Shelton:  We have carefully reviewed your last lipid profile from 01/29/2010 and the results are noted below with a summary of recommendations for lipid management.    Cholesterol:       165     Goal: <200   HDL "good" Cholesterol:   69     Goal: >40   LDL "bad" Cholesterol:   80     Goal: < 160   Triglycerides:       82     Goal: <150    Your cholesterol levels look good.  We also checked your thyroid (TSH) , liver function tests and kidney function which all looked normal.    TLC Diet (Therapeutic Lifestyle Change): Saturated Fats & Transfatty acids should be kept < 7% of total calories ***Reduce Saturated Fats Polyunstaurated Fat can be up to 10% of total calories Monounsaturated Fat Fat can be up to 20% of total calories Total Fat should be no greater than 25-35% of total calories Carbohydrates should be 50-60% of total calories Protein should be approximately 15% of total calories Fiber should be at least 20-30 grams a day ***Increased fiber may help lower LDL Total Cholesterol should be < 200mg /day Consider adding plant stanol/sterols to diet (example: Benacol spread) ***A higher intake of unsaturated fat may reduce Triglycerides and Increase HDL    Adjunctive Measures (may lower LIPIDS and reduce risk of Heart Attack) include: Aerobic Exercise (20-30 minutes 3-4 times a week) Limit Alcohol Consumption Weight Reduction Dietary Fiber 20-30 grams a day by mouth     Current Medications: 1)    Prilosec 40 Mg  Cpdr (Omeprazole) .... Take 1 tab by mouth daily 2)    Venlafaxine Hcl 75 Mg Tabs (Venlafaxine hcl) .... Take one half tablet twice a day for one week, then one tablet twice a day 3)    Naproxen Sodium 550 Mg Tabs (Naproxen sodium) .... One tablet  twice a day for severe migraine 4)    Proventil Hfa 108 (90 Base) Mcg/act Aers (Albuterol sulfate) .... 2 puffs every 4-6 hours as needed  If you have any questions, please call. We appreciate being able to work with you.   Sincerely,    [facility] Gina Harness MD  Appended Document: Lipid Letter labs mailed

## 2010-04-13 NOTE — Miscellaneous (Signed)
Summary: Gina Gina Shelton  Clinical Lists Changes  Problems: Added new problem of Gina Shelton (ICD-305.1) 

## 2010-04-13 NOTE — Assessment & Plan Note (Signed)
Summary: headache, cough,df   Vital Signs:  Patient profile:   35 year old female Height:      64.5 inches Weight:      167 pounds BMI:     28.32 Temp:     98.6 degrees F oral Pulse rate:   94 / minute BP sitting:   142 / 93  (left arm) Cuff size:   regular  Vitals Entered By: Tessie Fass CMA (January 29, 2010 9:53 AM) CC: headache, cough Is Patient Diabetic? No Pain Assessment Patient in pain? yes     Location: head Intensity: 7   Primary Care Deiondre Harrower:  Delbert Harness MD  CC:  headache and cough.  History of Present Illness: 35 yo here for evaluation of cough and headaches.  Has not been seen in over 1 year an thus has been off all medicines  Cough:  about 1.5 months of cough- starting with a cold- rhinnorrhea, sneezing, itchy eyes, fever and chills which went away in 2-3 weeks.  Has continued having coughing since that time.  Has some post-tussive emesis with Clear mucous production, no rhinorrhea.  Smokes 1 PPD.    No fevers currently.  Being around heavily scented,ammonia, bleach  makes her feel tightchested and trouble breathing.  Cough worse at night.  SOB with exertion.  Headaches:  2 weeks ago went to ER for headaches- was given "three shots in the butt" which took headaches away.  Came back several days later.  Has hx of migraines- these feel similar.  Had taken imitrex but does not like the way it is makes her feel Liek head is going to explode" but it did take her headache away.  Has been taking "PMS pills" for period but otherwise not taking meds for headaches.  Caffeine- 2 liter of pepsi per day. Reports 15 days of headache per month. + photophobia.  Notes pain usually right sided, behind eye.  Tobacco use:  smokes 1 PPD.  not interested in quitting or cutting back at this time.    Habits & Providers  Alcohol-Tobacco-Diet     Tobacco Status: current     Tobacco Counseling: to quit use of tobacco products     Cigarette Packs/Day: 1.0  Current Medications  (verified): 1)  Prilosec 40 Mg  Cpdr (Omeprazole) .... Take 1 Tab By Mouth Daily 2)  Venlafaxine Hcl 75 Mg Tabs (Venlafaxine Hcl) .... Take One Half Tablet Twice A Day For One Week, Then One Tablet Twice A Day 3)  Naproxen Sodium 550 Mg Tabs (Naproxen Sodium) .... One Tablet Twice A Day For Severe Migraine 4)  Proventil Hfa 108 (90 Base) Mcg/act Aers (Albuterol Sulfate) .... 2 Puffs Every 4-6 Hours As Needed  Allergies: No Known Drug Allergies PMH-FH-SH reviewed for relevance  Family History: mother-HTN,  GF--HTN, CA (?lung) GM-- alzheimers father--unknown  Social History: Single, 3 children, Lives alone-has boyfriend of 11 years (FOB); Smokes 1 ppd (since age 45);  Unemployed  Review of Systems      See HPI  Physical Exam  General:  Well-developed,well-nourished,in no acute distress; alert,appropriate and cooperative throughout examination. Vitals reviewed. Head:  Normocephalic and atraumatic without obvious abnormalities. No apparent alopecia or balding. Eyes:  No corneal or conjunctival inflammation noted. EOMI. Perrla.  Ears:  R ear normal and L ear normal.   Mouth:  Pharynx pink and moist.  Poor dentition Lungs:  Normal respiratory effort, chest expands symmetrically. Lungs are clear to auscultation, no crackles or wheezes.  Heart:  Normal rate and  regular rhythm. S1 and S2 normal without gallop, murmur, click, rub or other extra sounds. Abdomen:  non-tender, no distention, and no guarding.   Extremities:  no LE edema Neurologic:  alert & oriented X3, cranial nerves II-XII intact, strength normal in all extremities, and finger-to-nose normal.   Psych:  Oriented X3, normally interactive, and good eye contact.     Impression & Recommendations:  Problem # 1:  COUGH (ICD-786.2) Sounds like bronchospasm- increased sensetivity to cleanign agents.  Possibly post-viral.  No history of asthma.  Will get CXR and treat with short coure of albuterol.  Advised continue PPI and resume  antihistamine.  Discussed smoking as irritant, counseled to reduce.  Orders: CXR- 2view (CXR) [facility]- Est  Level 4 (04540)  Problem # 2:  MIGRAINE HEADACHE (ICD-346.90)  Chronic migraine.  Will choose effexor as she has untreated depression and some chronic pain.  Asked patient to keep headache log, reduce caffeine intake and use pain meds sparingly (naproxen prescribed today).  Will follow-up fro need for upward titration vs second agent.  The following medications were removed from the medication list:    Tramadol Hcl 50 Mg Tabs (Tramadol hcl) .Marland Kitchen... 1 tab by mouth q 6 hours as needed pain Her updated medication list for this problem includes:    Naproxen Sodium 550 Mg Tabs (Naproxen sodium) ..... One tablet twice a day for severe migraine  Orders: [facility]- Est  Level 4 (98119)  Problem # 3:  DEPRESSION, MAJOR, RECURRENT (ICD-296.30)  Was on sertraline.  Will restart with effexor as SNRI better for migraine.  Orders: [facility]- Est  Level 4 ([address]782)  Problem # 4:  Preventive Health Care (ICD-V70.0) flu shot today.  Will get fasting labwork today.  patient to return for gynecologcal exam.  Complete Medication List: 1)  Prilosec 40 Mg Cpdr (Omeprazole) .... Take 1 tab by mouth daily 2)  Venlafaxine Hcl 75 Mg Tabs (Venlafaxine hcl) .... Take one half tablet twice a day for one week, then one tablet twice a day 3)  Naproxen Sodium 550 Mg Tabs (Naproxen sodium) .... One tablet twice a day for severe migraine 4)  Proventil Hfa 108 (90 Base) Mcg/act Aers (Albuterol sulfate) .... 2 puffs every 4-6 hours as needed  Other Orders: Comp Met-[facility] [phone]) CBC-[facility] (78469) TSH-[facility] [phone]) Lipid-[facility] ([address])  Patient Instructions: 1)  Work on decreasing caffeine 2)  Avoid taking pain medicines more than twice per week 3)  Do not use naprosyn and other OTC pain medicines contains NSAIDS. 4)  Will get flu shot today 5)  start new medicine- venlafaxine for depression and headaches.   6)   I sent in an albuterol inhaler- try an allergy medicine to see if it helps your cough 7)  Will get labs and CXR today and discuss at next visit 8)  Follow-up for annual gynecological exam Prescriptions: PROVENTIL HFA 108 (90 BASE) MCG/ACT AERS (ALBUTEROL SULFATE) 2 puffs every 4-6 hours as needed  #1 x 1   Entered and Authorized by:   Delbert Harness MD   Signed by:   Delbert Harness MD on 01/29/2010   Method used:   Electronically to        [facility] [address] (retail)       [address]       [place], [place]  [id number]       Ph: [phone]       Fax: [phone]   RxID:   [id number] NAPROXEN SODIUM 550 MG TABS (NAPROXEN  SODIUM) one tablet twice a day for severe migraine  #30 x 0   Entered and Authorized by:   Delbert Harness MD   Signed by:   Delbert Harness MD on 01/29/2010   Method used:   Electronically to        [facility] [address] (retail)       [address]       [place], [place]  [id number]       Ph: [phone]       Fax: [phone]   RxID:   [id number] VENLAFAXINE HCL 75 MG TABS (VENLAFAXINE HCL) take one half tablet twice a day for one week, then one tablet twice a day  #60 x 1   Entered and Authorized by:   Delbert Harness MD   Signed by:   Delbert Harness MD on 01/29/2010   Method used:   Electronically to        [facility] [address] [address] (retail)       [address]       [place], [place]  [id number]       Ph: [phone]       Fax: [phone]   RxID:   [id number]    Orders Added: 1)  Comp Met-[facility] [[id number]] 2)  CBC-[facility] [85027] 3)  CXR- 2view [CXR] 4)  TSH-[facility] [[address]254-27062] 5)  Lipid-[facility] [80061-22930] 6)  [facility]- Est  Level 4 [37628]

## 2010-04-13 NOTE — Assessment & Plan Note (Signed)
Summary: headaches,tcb   Vital Signs:  Patient profile:   35 year old female Height:      64.5 inches Weight:      173.4 pounds BMI:     29.41 Temp:     98.5 degrees F oral Pulse rate:   93 / minute BP sitting:   126 / 77  (left arm) Cuff size:   regular  Vitals Entered By: Garen Grams LPN (March 04, 2009 2:58 PM) CC: headaches x 3 weeks Is Patient Diabetic? No Pain Assessment Patient in pain? yes     Location: headache   Primary Care Provider:  Asher Muir MD  CC:  headaches x 3 weeks.  History of Present Illness: Patient presents today with a complaint of frequent headaches.  She describes these headaches as as sharp and shooting pain (9/10 serverity) that last for approximatly 6-7 seconds with a residual ache lasting 1-2 hours.  She has 5-6 of these episodes a day.  The patient reports the pain starts at her right temporal area and radiates down to her ear and down her neck.  There are no associated visual changes or vertigo but she does percieve some "fullness" in her ear but with no real hearing loss.  The patient has been taking 800mg  Ibprofen with some relief of the risidual headache but with no relief of the sharp pains.  Laying on her affected side increases her pain and can trigger one of these episodes.  Note: she was seen in the ED for this issue 02/28/09 - CT normal.  Habits & Providers  Alcohol-Tobacco-Diet     Tobacco Status: current     Cigarette Packs/Day: 1.0  Current Medications (verified): 1)  Prilosec 40 Mg  Cpdr (Omeprazole) .... Take 1 Tab By Mouth Daily 2)  Gabapentin 300 Mg  Tabs (Gabapentin) .Marland Kitchen.. 1-2 Tabs By Mouth Two Times A Day 3)  Reglan 5 Mg  Tabs (Metoclopramide Hcl) .... Take 2 Tabs 30 Minutes Before Meals and At Bedtime.  If You Only Get Partial Relief With 1 Tab, You May Increased To 2 Tabs 4)  Sertraline Hcl 100 Mg Tabs (Sertraline Hcl) .... Take 1 and 1/2 Tablets By Mouth Daily For Depression 5)  Miralax   Powd (Polyethylene Glycol  3350) .... Mix 1 Capful (17 Grams) in 8 Ozs of Fluid and Take By Mouth Daily As Needed Constipation 6)  Imipramine Hcl 25 Mg Tabs (Imipramine Hcl) .... Take 1 Tab By Mouth Qhs 7)  Tramadol Hcl 50 Mg Tabs (Tramadol Hcl) .Marland Kitchen.. 1 Tab By Mouth Q 6 Hours As Needed Pain 8)  Azithromycin 250 Mg Tabs (Azithromycin) .... 2 Tabs By Mouth On The 1st Day; Then 1 Tab By Mouth For The Next 4 Days 9)  Doxycycline Hyclate 100 Mg Caps (Doxycycline Hyclate) .Marland Kitchen.. 1 Cap By Mouth Two Times A Day For 10 Days For Infection 10)  Ibuprofen 600 Mg Tabs (Ibuprofen) .Marland Kitchen.. 1 Tab By Mouth Three Times A Day As Needed Pain 11)  Flexeril 10 Mg Tabs (Cyclobenzaprine Hcl) .... One By Mouth Up To Three Times A Day As Needed Muscle Spasm  Allergies (verified): No Known Drug Allergies  Past History:  Past medical, surgical, family and social histories (including risk factors) reviewed for relevance to current acute and chronic problems.  Past Medical History: Reviewed history from 01/17/2008 and no changes required. trich-8/05, 12/05, 9/09 depression abd pain carpal tunnel  achilles tendonitis  Past Surgical History: Reviewed history from 11/23/2006 and no changes required. emg-nl, no  carpal tunnel - 10/25/2004,  C/S X2 2001 and 2004 in 2004, wound infection requiring 2 surgical repairs Tubal ligation - 09/12/2002  Family History: Reviewed history from 11/23/2006 and no changes required. mother-HTN,  GF--HTN, CA (?lung) GM-- father--unknown  Social History: Reviewed history from 05/11/2006 and no changes required. Single, 3 children, 1 partner(FOB); Smokes 1 ppd (since age 66); Works at [employer]  Review of [employer]:  Denies chills and fever. MS:  Complains of muscle aches. Neuro:  Complains of headaches; denies sensation of room spinning and visual disturbances.  Physical Exam  General:  Well-developed,well-nourished,in no acute distress; alert,appropriate and cooperative throughout examination. Vitals  reviewed. Head:  Normocephalic and atraumatic without obvious abnormalities. No apparent alopecia or balding. Eyes:  No corneal or conjunctival inflammation noted. EOMI. Perrla.  Ears:  R ear normal and L ear normal.   Mouth:  Pharynx pink and moist.   Neck:  ttp along right cervical paraspinal mm, with restriction in right sidebending and bilateral rotation, no skin changes, + hypertonic mm. Neurologic:  Cranial nerves II-XII intact.   Psych:  Normally interactive, good eye contact, and not anxious appearing.     Impression & Recommendations:  Problem # 1:  HEADACHE, TENSION (ICD-307.81) Assessment New Exam c/w tension HA 2/2 neck muscle spasm. Rx Tramadol and Flexeril. Neck stretches recommended for prevention once acute pain resolved. Follow up instructions given if pain does not improve.  The following medications were removed from the medication list:    Ibuprofen 600 Mg Tabs (Ibuprofen) .Marland Kitchen... 1 tab by mouth three times a day as needed pain Her updated medication list for this problem includes:    Tramadol Hcl 50 Mg Tabs (Tramadol hcl) .Marland Kitchen... 1 tab by mouth q 6 hours as needed pain  Orders: [facility]- Est Level  3 ([address])  Problem # 2:  NECK SPRAIN AND STRAIN (ICD-847.0) Assessment: New See #1. The following medications were removed from the medication list:    Ibuprofen 600 Mg Tabs (Ibuprofen) .Marland Kitchen... 1 tab by mouth three times a day as needed pain Her updated medication list for this problem includes:    Tramadol Hcl 50 Mg Tabs (Tramadol hcl) .Marland Kitchen... 1 tab by mouth q 6 hours as needed pain    Flexeril 10 Mg Tabs (Cyclobenzaprine hcl) ..... One by mouth up to three times a day as needed muscle spasm  Orders: [facility]- Est Level  3 (91478)  Complete Medication List: 1)  Prilosec 40 Mg Cpdr (Omeprazole) .... Take 1 tab by mouth daily 2)  Gabapentin 300 Mg Tabs (Gabapentin) .Marland Kitchen.. 1-2 tabs by mouth two times a day 3)  Reglan 5 Mg Tabs (Metoclopramide hcl) .... Take 2 tabs 30 minutes before  meals and at bedtime.  if you only get partial relief with 1 tab, you may increased to 2 tabs 4)  Sertraline Hcl 100 Mg Tabs (Sertraline hcl) .... Take 1 and 1/2 tablets by mouth daily for depression 5)  Miralax Powd (Polyethylene glycol 3350) .... Mix 1 capful (17 grams) in 8 ozs of fluid and take by mouth daily as needed constipation 6)  Imipramine Hcl 25 Mg Tabs (Imipramine hcl) .... Take 1 tab by mouth qhs 7)  Tramadol Hcl 50 Mg Tabs (Tramadol hcl) .Marland Kitchen.. 1 tab by mouth q 6 hours as needed pain 8)  Azithromycin 250 Mg Tabs (Azithromycin) .... 2 tabs by mouth on the 1st day; then 1 tab by mouth for the next 4 days 9)  Doxycycline Hyclate 100 Mg Caps (Doxycycline hyclate) .Marland KitchenMarland KitchenMarland Kitchen  1 cap by mouth two times a day for 10 days for infection 10)  Flexeril 10 Mg Tabs (Cyclobenzaprine hcl) .... One by mouth up to three times a day as needed muscle spasm  Patient Instructions: 1)  It was nice to meet you today.  2)  Make sure to ice your neck 1-2 times daily. 3)  We are prescribing a muscle relaxer. 4)  Take your Tramadol four times a day for the next 2 days and then as needed for pain. 5)  Let us know if your pain worsens or does not go away in 1 week. Prescriptions: FLEXERIL 10 MG TABS (CYCLOBENZAPRINE HCL) one by mouth up to three times a day as needed muscle spasm  #60 x 0   Entered and Authorized by:   Helane Rima DO   Signed by:   Helane Rima DO on 03/04/2009   Method used:   Electronically to        [facility] [address] [address] (retail)       [address]       [place], [place]  [id number]       Ph: [phone]       Fax: [phone]   RxID:   [id number]   Prevention & Chronic Care Immunizations   Influenza vaccine: given  (01/17/2008)   Influenza vaccine due: 01/16/2009    Tetanus booster: 03/14/2000: Done.   Tetanus booster due: 03/14/2010    Pneumococcal vaccine: Not documented  Other Screening   Pap smear: NEGATIVE FOR INTRAEPITHELIAL LESIONS OR MALIGNANCY.   (01/17/2008)   Pap smear due: 10/12/2004   Smoking status: current  (03/04/2009)   Smoking cessation counseling: yes  (01/17/2008)

## 2010-04-15 NOTE — Letter (Signed)
Summary: Results Follow-up Letter  [facility]  [address]   [place], [place] [address]   Phone: [phone]  Fax: [phone]    02/26/2010  [address] [place], [place]  [address]  Dear Ms. WORTHY,   The following are the results of your recent test(s):  All of your lab results, some of which include HIV, rheumatoid factor, vitamin B12, ANA, Syphllis were all normal.  I look forward to out next appointment.  Sincerely,  Delbert Harness MD [facility]           Appended Document: Results Follow-up Letter mailed

## 2010-04-15 NOTE — Assessment & Plan Note (Signed)
Summary: F/U  Gina Shelton   Vital Signs:  Patient profile:   35 year old female Height:      64.5 inches Weight:      165.7 pounds BMI:     28.10 Temp:     98.8 degrees F oral Pulse rate:   94 / minute BP sitting:   131 / 91  (right arm) Cuff size:   regular  Vitals Entered By: Jimmy Footman, CMA (February 25, 2010 9:09 AM) CC: follow-up visit Is Patient Diabetic? No Pain Assessment Patient in pain? no        Primary Care Preslynn Bier:  Delbert Harness MD  CC:  follow-up visit.  History of Present Illness: 35 yo here for follow-up  headaches- says headaches have decreased from 1-2 times a week from up to 5 times a week.  Has been taking effexor for 4 weeks now.  Taking 4-5 pills of naproxen a week. no clear triggers.  Did nto bring log.  coughing- itchy throat.  non-productive but sometimes some mucous, both day and night.  Some coughing with exertion, but no SOB.  Uses albuterol 2-3 times per day.  3-4 days of the week.  Most times help, sometimes not.  Feel she has some reflux, prilosec mostly helpful.    depression:  States mood is "a little below average"  notes frequent crying, feels has gotten worse in the past month.  Low energy.  Poor sleep from coughing.  No manic symtpoms.  tobacco use: has decreased from 1 pack and 1/2 to 1 ppd.  As our appt was complete, she brought up continued chronic bialteral hand and thigh tingling, pain.  Worse after sitting for a while, worse first thign in the AM.  takes 5 minutes to get going.  feels her hands are swollen.  No particular joint.    Habits & Providers  Alcohol-Tobacco-Diet     Tobacco Status: current  Current Medications (verified): 1)  Prilosec 40 Mg  Cpdr (Omeprazole) .... Take 1 Tab By Mouth Daily 2)  Venlafaxine Hcl 225 Mg Xr24h-Tab (Venlafaxine Hcl) .... Take One Tablet Daily 3)  Naproxen Sodium 550 Mg Tabs (Naproxen Sodium) .... One Tablet Twice A Day For Severe Migraine 4)  Proventil Hfa 108 (90 Base) Mcg/act Aers (Albuterol  Sulfate) .... 2 Puffs Every 4-6 Hours As Needed 5)  Cetirizine Hcl 10 Mg Tabs (Cetirizine Hcl) .... Take One Tablet Daily  Allergies: No Known Drug Allergies  Past History:  Past Surgical History: Last updated: 11/23/2006 emg-nl, no carpal tunnel - 10/25/2004,  C/S X2 2001 and 2004 in 2004, wound infection requiring 2 surgical repairs Tubal ligation - 09/12/2002  Family History: Last updated: 01/29/2010 mother-HTN,  GF--HTN, [place] (?lung) GM-- alzheimers father--unknown  Past Medical History: trich-8/05, 12/05, 9/09 depression abd pain carpal tunnel -  negative emg achilles tendonitis PMH-FH-SH reviewed for relevance  Review of Systems      See HPI General:  Denies chills, fever, and weight loss. Eyes:  Complains of itching. ENT:  Complains of nasal congestion. CV:  Denies chest pain or discomfort, lightheadness, shortness of breath with exertion, and swelling of feet. Resp:  Complains of cough; denies chest discomfort, coughing up blood, pleuritic, and shortness of breath. GI:  Denies abdominal pain, constipation, diarrhea, nausea, and vomiting. Psych:  Complains of depression, easily angered, easily tearful, and irritability; denies suicidal thoughts/plans.  Physical Exam  General:  Well-developed,well-nourished,in no acute distress; alert,appropriate and cooperative throughout examination. Vitals reviewed.  Nose:  External nasal examination shows  no deformity or inflammation. Nasal mucosa are pink and moist without lesions or exudates. Mouth:  Oral mucosa and oropharynx without lesions or exudates.   Neck:  supple, no LAD Lungs:  Normal respiratory effort, chest expands symmetrically. Lungs are clear to auscultation, no crackles or wheezes.  Heart:  Normal rate and regular rhythm. S1 and S2 normal without gallop, murmur, click, rub or other extra sounds. Abdomen:  Bowel sounds positive,abdomen soft and non-tender without masses, organomegaly or hernias noted. Msk:  tender  to palpation to upper thighs.  No LE or hand  edema., no obvious knee or hand effusions.  sensation to light touch intact.  Strength 5/5. Psych:  Oriented X3 and memory intact for recent and remote.  sad appearing   Impression & Recommendations:  Problem # 1:  MIGRAINE HEADACHE (ICD-346.90) Assessment Improved  improved.  Will titrate up effexor and revisit- goal no more than twice a week as needed naprosyn  Her updated medication list for this problem includes:    Naproxen Sodium 550 Mg Tabs (Naproxen sodium) ..... One tablet twice a day for severe migraine  Orders: [facility]- Est  Level 4 (04540)  Problem # 2:  DEPRESSION, MAJOR, RECURRENT (ICD-296.30) Assessment: Deteriorated  worsenened. Onset seemed to be worse prior to starting effexor.  I dont think it is just due to starting effexor and no evidence of mania or hypomania.  Will increase effexor and patient intersted in therapy.  And[facility] Spearman card for Dr. Pascal Lux.  Will follow-up in 3-4 weeks.  Orders: [facility]- Est  Level 4 (98119)  Problem # 3:  COUGH (ICD-786.2)  will add zyrtec.  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  Not obviously neuropathic.  HAs sister with similar condition although she does not know of any diagnosis.  Will draw labs as below.  Titrating effexor upward may help some.   Orders: B12-[facility] (95621-30865) ANA-[facility] (78469-62952) RPR-[facility] [phone]) Rheum Fact-[facility] (27253) HIV-[facility] (66440-34742) Sed Rate (ESR)-[facility] [phone]) [facility]- Est  Level 4 (87564)  Complete Medication List: 1)  Prilosec 40 Mg Cpdr (Omeprazole) .... Take 1 tab by mouth daily 2)  Venlafaxine Hcl 225 Mg Xr24h-tab (Venlafaxine hcl) .... Take one tablet daily 3)  Naproxen Sodium 550 Mg Tabs (Naproxen sodium) .... One tablet twice a day for severe migraine 4)  Proventil Hfa 108 (90 Base) Mcg/act Aers (Albuterol sulfate) .... 2 puffs  every 4-6 hours as needed 5)  Cetirizine Hcl 10 Mg Tabs (Cetirizine hcl) .... Take one tablet daily  Patient Instructions: 1)  Will increase your venlafaxine to 225 mg a day- this is a new once a day pill- extended release 2)  Will start zyrtec for allergy/cough 3)  Will check labs 4)  See information for Dr. Pascal Lux- for therapy 5)  follow-up in 1 month. Prescriptions: VENLAFAXINE HCL 225 MG XR24H-TAB (VENLAFAXINE HCL) take one tablet daily  #30 x 1   Entered and Authorized by:   Delbert Harness MD   Signed by:   Delbert Harness MD on 02/25/2010   Method used:   Electronically to        [facility] [address] (retail)       [address]       [place], [place]  [id number]       Ph: [phone]       Fax: [phone]   RxID:   [id number] CETIRIZINE HCL  10 MG TABS (CETIRIZINE HCL) take one tablet daily  #30 x 5   Entered and Authorized by:   Delbert Harness MD   Signed by:   Delbert Harness MD on 02/25/2010   Method used:   Electronically to        [facility] (retail)       [address]       [place], [place]  [id number]       Ph: [phone]       Fax: [phone]   RxID:   [id number]    Orders Added: 1)  B12-[facility] [82607-23330] 2)  ANA-[facility] [53664-40347] 3)  RPR-[facility] [42595-63875] 4)  Rheum Fact-[facility] [64332] 5)  HIV-[facility] [95188-41660] 6)  Sed Rate (ESR)-[facility] [85651] 7)  [facility]- Est  Level 4 [63016]     Prevention & Chronic Care Immunizations   Influenza vaccine: given  (01/17/2008)   Influenza vaccine due: 01/16/2009    Tetanus booster: 03/14/2000: Done.   Tetanus booster due: 03/14/2010    Pneumococcal vaccine: Not documented  Other Screening   Pap smear: NEGATIVE FOR INTRAEPITHELIAL LESIONS OR MALIGNANCY.  (01/17/2008)   Pap smear due: 10/12/2004   Smoking status: current  (02/25/2010)   Smoking cessation counseling: yes  (01/17/2008)   Appended Document: Sedrate  15 mm/hr      Lab Visit  Laboratory Results   Blood  Tests   Date/Time Received: February 25, 2010 10:03 AM  Date/Time Reported: February 25, 2010 2:55 PM   SED rate: 15  mm/hr  Comments: ...............test performed by......Marland KitchenBonnie A. [place], MLS (ASCP)cm    Orders Today:

## 2010-06-01 LAB — COMPREHENSIVE METABOLIC PANEL
ALT: 17 U/L (ref 0–35)
Alkaline Phosphatase: 110 U/L (ref 39–117)
BUN: 5 mg/dL — ABNORMAL LOW (ref 6–23)
CO2: 23 mEq/L (ref 19–32)
Chloride: 109 mEq/L (ref 96–112)
GFR calc non Af Amer: >60 mL/min (ref >60)
Glucose, Bld: 83 mg/dL (ref 70–99)
Potassium: 3.7 mEq/L (ref 3.5–5.1)
Sodium: 137 mEq/L (ref 135–145)
Total Bilirubin: 0.7 mg/dL (ref 0.3–1.2)
Total Protein: 6.9 g/dL (ref 6.0–8.3)

## 2010-06-01 LAB — DIFFERENTIAL
Basophils Absolute: 0 10*3/uL (ref 0.0–0.1)
Basophils Relative: 0 % (ref 0–1)
Eosinophils Absolute: 0.1 10*3/uL (ref 0.0–0.7)
Monocytes Relative: 8 % (ref 3–12)
Neutro Abs: 5.6 10*3/uL (ref 1.7–7.7)
Neutrophils Relative %: 63 % (ref 43–77)

## 2010-06-01 LAB — CBC
HCT: 37.2 % (ref 36.0–46.0)
Hemoglobin: 12.9 g/dL (ref 12.0–15.0)
RBC: 3.84 MIL/uL — ABNORMAL LOW (ref 3.87–5.11)

## 2010-06-01 LAB — URINALYSIS, ROUTINE W REFLEX MICROSCOPIC
Glucose, UA: NEGATIVE mg/dL
Ketones, ur: 40 mg/dL — AB
Protein, ur: NEGATIVE mg/dL
Urobilinogen, UA: 1 mg/dL (ref 0.0–1.0)

## 2010-06-01 LAB — URINE MICROSCOPIC-ADD ON

## 2010-06-14 LAB — SEDIMENTATION RATE: Sed Rate: 10 mm/hr (ref 0–22)

## 2010-06-28 LAB — COMPREHENSIVE METABOLIC PANEL
AST: 28 U/L (ref 0–37)
Albumin: 3.8 g/dL (ref 3.5–5.2)
Calcium: 9.1 mg/dL (ref 8.4–10.5)
Chloride: 109 mEq/L (ref 96–112)
Creatinine, Ser: 0.7 mg/dL (ref 0.4–1.2)
GFR calc Af Amer: >60 mL/min (ref >60)
Total Bilirubin: 0.9 mg/dL (ref 0.3–1.2)
Total Protein: 6.9 g/dL (ref 6.0–8.3)

## 2010-06-28 LAB — URINALYSIS, ROUTINE W REFLEX MICROSCOPIC
Bilirubin Urine: NEGATIVE
Hgb urine dipstick: NEGATIVE
Nitrite: NEGATIVE
Specific Gravity, Urine: 1.023 (ref 1.005–1.030)
pH: 6 (ref 5.0–8.0)

## 2010-06-28 LAB — DIFFERENTIAL
Eosinophils Relative: 1 % (ref 0–5)
Lymphocytes Relative: 24 % (ref 12–46)
Lymphs Abs: 1.7 10*3/uL (ref 0.7–4.0)
Monocytes Absolute: 0.5 10*3/uL (ref 0.1–1.0)
Monocytes Relative: 7 % (ref 3–12)

## 2010-06-28 LAB — URINE MICROSCOPIC-ADD ON

## 2010-06-28 LAB — CBC
MCV: 91.9 fL (ref 78.0–100.0)
Platelets: 276 10*3/uL (ref 150–400)
WBC: 6.9 10*3/uL (ref 4.0–10.5)

## 2010-07-23 ENCOUNTER — Ambulatory Visit: Payer: Self-pay | Admitting: Family Medicine

## 2010-07-28 ENCOUNTER — Ambulatory Visit: Payer: Self-pay | Admitting: Family Medicine

## 2010-07-30 NOTE — Op Note (Signed)
NAME:  [name], [name]                        ACCOUNT NO.:  [id number]   MEDICAL RECORD NO.:  [id number]                   PATIENT TYPE:  INP   LOCATION:  9306                                 FACILITY:  [facility]   PHYSICIAN:  Abigail Miyamoto, M.D.              DATE OF BIRTH:  [birth year]   DATE OF PROCEDURE:  09/28/2002  DATE OF DISCHARGE:                                 OPERATIVE REPORT   PREOPERATIVE DIAGNOSIS:  Fascial dehiscence.   POSTOPERATIVE DIAGNOSES:  Fascial dehiscence.   PROCEDURE:  1. Exploratory laparotomy.  2. Closure of abdominal wall fascia.   SURGEON:  Abigail Miyamoto, M.D.   ASSISTANT:  Conni Elliot, M.D.   ANESTHESIA:  General endotracheal anesthesia.   ESTIMATED BLOOD LOSS:  Minimal.   INDICATIONS:  Gina Shelton is a 35 year old female who underwent emergency  C-section approximately one week ago through a vertical incision.  She now  presents with an open wound draining serous fluid and emesis.  She had an x-  ray which showed free air.  Given these findings, the exam was worrisome for  fascial dehiscence.  Therefore, the patient is taken to the operating room  for exploration.   FINDINGS:  The patient was indeed found to have a suture that had pulled  through the fascia, causing a fascial dehiscence.  There was no  evisceration.  The small bowel was dilated, but there were no injuries to  the bowel, and there was a mild amount of intra-abdominal free fluid.  The  fascia, otherwise, appeared healthy.   PROCEDURE IN DETAIL:  The patient was brought to the operating room and  identified as Gina Shelton.  She was placed supine on the operating room  table, and general anesthesia was induced.  Her abdomen was then prepped and  draped in the usual sterile fashion.  The patient's previously placed  staples were then removed.  The skin was then separated, and the patient was  found to have a large fascial dehiscence with the Vicryl sutures pulled  apart.  The sutures were completely excised with the scissors and removed.  The abdomen was then briefly explored.  Several adhesions in the  midabdominal wall were bluntly taken down.  The patient's small bowel  appeared dilated but uninjured.  The patient did have a mild amount of free  fluid and an enlarged uterus, consistent with her recent C-section.  At this  point, the abdomen was irrigated with several liters of normal saline.  The  midline fascia was then closed with a running #1 PDS suture, as well as  interrupted #1 Novofil internal retention sutures.  Good closure of the  fascia appeared to be achieved.  At this point, the wound was then  thoroughly irrigated with normal saline.  The  skin was then closed with skin staples.  The patient tolerated the procedure  well.  All sponge, needle, and instrument counts were  correct at the end of  the procedure.  The patient was then extubated in the operating room and  taken in a stable condition to the recovery room.                                               Abigail Miyamoto, M.D.    DB/MEDQ  D:  09/29/2002  T:  09/30/2002  Job:  [id number]

## 2010-07-30 NOTE — Consult Note (Signed)
NAME:  Gina Shelton, Gina Shelton                        ACCOUNT NO.:  [id number]   MEDICAL RECORD NO.:  [id number]                   PATIENT TYPE:  EMS   LOCATION:  ED                                   FACILITY:  [facility]   PHYSICIAN:  Ollen Gross. Vernell Morgans, M.D.              DATE OF BIRTH:  25-Jun-1975   DATE OF CONSULTATION:  11/02/2002  DATE OF DISCHARGE:                                   CONSULTATION   HISTORY OF PRESENT ILLNESS:  Gina Shelton is a 35 year old black female who is  now five weeks status post a C-section who over the last two days has been  complaining of significant dysuria and abdominal bloating.  She has not run  any fevers, she has not had any nausea or vomiting, she has been passing gas  and having bowel movements.  The pain seems to be localized on the left side  and does not radiate anywhere.  She went to [facility] earlier where  she had a KUB done that was suggestive of an ileus.   REVIEW OF SYSTEMS:  On further review of systems, again, she denies any  nausea, vomiting, fevers, chills, chest pain, shortness of breath, diarrhea.  She does have significant dysuria.  The rest of her review of systems is  unremarkable.   PAST MEDICAL HISTORY:  Significant for PID on multiple occasions.   PAST SURGICAL HISTORY:  Significant for C-section and subsequent reclosure  of her abdominal wound after dehiscence.   MEDICATIONS:  Ibuprofen.   ALLERGIES:  No known drug allergies.   SOCIAL HISTORY:  She denies any current use of alcohol or tobacco products.   FAMILY HISTORY:  Noncontributory.   PHYSICAL EXAMINATION:  VITAL SIGNS:  She is afebrile with stable vital  signs.  GENERAL:  She is a well-developed, well-nourished black female in no acute  distress.  SKIN:  Warm and dry with no jaundice.  HEENT:  Eyes show her extraocular muscles are intact.  Pupils equal, round,  and reactive to light.  Sclerae nonicteric.  LUNGS:  Clear bilaterally with no use of accessory  respiratory muscles.  HEART:  Regular rate and rhythm with an impulse in the left chest.  ABDOMEN:  Soft, mildly tender on the left side, but no guarding or  peritoneal signs.  I cannot palpate any mass.  Her abdomen is very soft.  She does have a lower midline incision with a slight opening to the skin at  the mid portion but this is very superficial.  EXTREMITIES:  No cyanosis, clubbing, or edema.  PSYCHOLOGIC:  She is alert and oriented x3 with no evidence of any anxiety  or depression.   LABORATORY DATA:  On review of her lab work, her white count was normal at  8300.  Her UA was significant for many bacteria, many yeast.  No other  abnormalities were found on her laboratory workup.   ASSESSMENT  AND PLAN:  This is a 35 year old black female with two days of  dysuria and abdominal distention.  I suspect that she has an ileus  associated with a urinary tract infection.  Because she is postoperative  from a C-section we will plan to CT her abdomen and pelvis looking for any  source of an ileus such as an abscess or adhesive-type partial obstruction.  If her CT is otherwise unremarkable we will treat her for a urinary tract  infection and have her follow up with her OB.                                               Ollen Gross. Vernell Morgans, M.D.    PST/MEDQ  D:  11/02/2002  T:  11/02/2002  Job:  [id number]

## 2010-07-30 NOTE — Op Note (Signed)
NAME:  Gina Shelton, Gina Shelton                        ACCOUNT NO.:  [id number]   MEDICAL RECORD NO.:  [id number]                   PATIENT TYPE:  INP   LOCATION:  9108                                 FACILITY:  [facility]   PHYSICIAN:  Phil D. Okey Dupre, M.D.                  DATE OF BIRTH:  [birth year]   DATE OF PROCEDURE:  09/21/2002  DATE OF DISCHARGE:                                 OPERATIVE REPORT   PROCEDURE:  Low transverse cesarean section and bilateral tubal ligation.   PREOPERATIVE DIAGNOSES:  Non-reassuring fetal heart pattern with severe  variable decelerations.   POSTOPERATIVE DIAGNOSES:  Non-reassuring fetal heart pattern with severe  variable decelerations plus occult prolapse of a nuchal cord.   SURGEON:  Javier Glazier. Okey Dupre, M.D.   ANESTHESIA:  Spinal.   ESTIMATED BLOOD LOSS:  500 mL.   OPERATIVE FINDINGS:  Upon entering the uterine cavity the vertex deep in the  pelvis was brought up and there was a nuchal cord with two loops of cord on  the side of the baby's head against the uterine wall which was probably what  was causing these decelerations although the fluid was clear.   PROCEDURE:  Under satisfactory spinal anesthesia with the patient in the  dorsal supine position and a Foley catheter in the urinary bladder the  abdomen was prepped and draped in the usual sterile manner.  It was then  entered through a vertical incision extending from just below the umbilicus  to just above the symphysis pubis and was entered by layers.  On entering  the peritoneal cavity the visceroperitoneum and the anterior surface of the  uterus was opened transversely just above the bladder which was pushed away  from the lower uterine segment by blunt dissection.  The uterus was entered  with sharp and blunt dissection and from a  LOT presentation the findings  were as above.  The baby was easily delivered from the vertex presentation.  The cord was doubly clamped and divided and the baby handed to  the  pediatrician after bulb suction was used to suck the mucous and fluids from  the baby's mouth and nares.  A cord sample was taken for analysis.  The  placenta was then manually removed and the uterus explored.  There was much  adherent membranes within the uterus which were removed as much as possible.  The uterus was then closed with continuous running locked 0 Vicryl on an  atraumatic needle.  No bleeding was noted at that point.  Each fallopian  tube was grasped in the mid point. An opening was made in an avascular  portion of the meso beneath the tube. A #1 plain suture brought this  opening, tied on the distal and proximal end of the tube forming a loop of  proximal 2 cm above the tie.  This tie was held with a hemostat.  A second  tie placed using the same material below the aforementioned tie.  The  section of the tube above the first tie was then excised and sent for  pathological diagnosis and the free ends of the tube where the dissection  occurred were coagulated with hot cautery.  Areas were checked for bleeding  and none was noted.  The abdominal fascia was closed with a continuous  running alternating locked 0 Vicryl on an atraumatic needle from either  incision meeting in the mid point.  Subcutaneous closure was obtained with 0  chromic suture and the skin edges approximated with skin staples.  A dry  sterile dressing was applied. The patient tolerated the procedure well with  a total blood loss of approximately 500 mL.  Both portions of fallopian tubes and the placenta were sent for pathological  diagnosis.  She was transferred to the recovery room with a Foley catheter  draining clear yellow urine.  At the end of the procedure the tape,  instrument, sponge and needle counts were reported correct.                                               Phil D. Okey Dupre, M.D.    PDR/MEDQ  D:  09/21/2002  T:  09/22/2002  Job:  [id number]

## 2010-07-30 NOTE — Discharge Summary (Signed)
NAME:  Gina Shelton, Gina Shelton                        ACCOUNT NO.:  [id number]   MEDICAL RECORD NO.:  [id number]                   PATIENT TYPE:  INP   LOCATION:  9108                                 FACILITY:  [facility]   PHYSICIAN:  Dr. Kristen Loader                    DATE OF BIRTH:  September 07, 1975   DATE OF ADMISSION:  09/21/2002  DATE OF DISCHARGE:  09/24/2002                                 DISCHARGE SUMMARY   DISCHARGE DIAGNOSES:  A 35 year old G4, P2-1-0-3 intrauterine pregnancy,  delivered, live birth status post received low transverse cesarean section  for fetal __________ , group beta Streptococcus positive.   DISCHARGE MEDICATIONS:  1. Ibuprofen 600 mg 1 tablet every 6 hours as needed for pain.  2. Percocet 5/325 mg 1-2 tablets every 4-6 hours as needed for severe pain.  3. Prenatal vitamins 1 tablet per day for 6 weeks.   FOLLOWUP:  Follow up at [facility] after six weeks.   ADMITTING HISTORY AND PHYSICAL EXAMINATION:  The patient is a 35 year old  G3, P1-1-0-2 presented at 38 weeks and 1 day because of labor pain and  passage of watery vaginal discharge noted to be clear.   PRENATAL COURSE:  The patient being seen at [facility] and transferred  to high risk clinic.  Risks of this pregnancy:  The patient had history of  preterm delivery of 31 weeks and a cesarean section was done for a ruptured  placenta.   OBSTETRICAL HISTORY:  In 1999 SVD 6 pounds.  In 2000 primary cesarean  section at 31 weeks, 3 pounds with abruption.   GYNECOLOGIC HISTORY:  1. History of chondromalacia.  2. Chlamydia.  3. GC.  4. Trichomonas.   MEDICAL HISTORY:  History of urinary tract infections.   PAST SURGICAL HISTORY:  Unremarkable.   FAMILY AND SOCIAL HISTORY:  Unremarkable.   PERTINENT LABORATORY RESULTS:  GBS positive.  Blood type B plus.  Rubella  immune.   HOSPITAL COURSE:  Vital signs on admission:  Blood pressure 140/73, pulse  rate 83, respiratory rate 20, temperature 99.   Physical exam findings  essentially normal.  On speculum examination there was noted a large amount  of amniotic fluid, bleeding of the posterior vaginal walls.  Cervix was 4 cm  dilated, 80% effaced, station -3 with note of vertex presentation.  Fetal  heart rate tracing:  Baseline rate was 132 per minute with average  variability, no decelerations.  Reactive tracing contractions every five  minutes, 70 seconds in duration, strong.  Ferning test done and was  positive.  The patient was admitted for trial of labor after cesarean  section.  The patient was also started on penicillin G 5 million units IV  for one dose followed by 2.5 million units IV every 4 hours for GBS  prophylaxis.  The patient was observed for progress of labor.  A fetal scalp  electrode  was placed and there was note of variable decelerations.  The  patient was further observed and there was note of severe decelerations with  contractions, decreasing to as low as 40 beats per minute with slow recovery  and the patient with 6 cm dilatation, the patient was then referred for an  emergency low transverse caesarian section, and she underwent a low  transverse caesarian section with bilateral tubal ligation.  Estimated blood  loss was 500 mL.  Anesthesia was spinal anesthesia.  Postoperative diagnosis  was __________, prolapsed cord.  The baby was delivered on 09/21/2002 at  07:56.  The patient delivered a live baby boy with Apgar score of seven at 1  minute and ten at 5 minutes.  The placenta was manually extracted.  The  patient tolerated the procedure well.  Postoperative hemoglobin was 10.6.  Mother bottle fed baby and the baby underwent circumcision.  Postoperative  course was unremarkable and the patient was discharged on the third  postoperative day.     Lawerance Sabal, MD                         Dr. Kristen Loader    MC/MEDQ  D:  09/24/2002  T:  09/24/2002  Job:  [id number]

## 2010-07-30 NOTE — Discharge Summary (Signed)
   NAME:  Gina Shelton, Gina Shelton                        ACCOUNT NO.:  [id number]   MEDICAL RECORD NO.:  [id number]                   PATIENT TYPE:  INP   LOCATION:  9306                                 FACILITY:  [facility]   PHYSICIAN:  Abigail Miyamoto, M.D.              DATE OF BIRTH:  [birth year]   DATE OF ADMISSION:  09/28/2002  DATE OF DISCHARGE:  10/02/2002                                 DISCHARGE SUMMARY   SUMMARY OF HISTORY:  This is a 35 year old female who had undergone an  emergent C-section at [facility].  I was called to consultation on her  on September 28, 2002 as she was having abdominal pain and emesis and  serosanguinous drainage from her incision.  Dr. Gavin Potters from OB/GYN had  called me in consultation.  It was suspected that she had a fascial  dehiscence, was in jeopardy of evisceration, and because of her distended  loop of small bowel, air-fluid levels and free air, and an elevated white  count the decision was made to go to the operating room for an abdominal  wound closure.   HOSPITAL COURSE:  The patient was taken emergently to the operating room on  July 18 where she underwent exploratory laparotomy and closure of abdominal  fascia.  She tolerated this well and was taken to a regular surgical floor  for IV antibiotics, wound care, and pain management.  She had a  postoperative ileus as expected and persistent elevated white blood count  and fever so she was continued on IV antibiotics for several days.  Part of  the wound had to be opened for wound infection on postoperative day #4 so  home health arrangements were made, but by postoperative day #4 she was  tolerating p.o. pain medications and a diet.  Home health care arrangements  were thus made and the decision was made to discharge the patient home   DIAGNOSES:  1. 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  2. Wound infection.   DISCHARGE DIET:   Regular.   DISCHARGE ACTIVITY:  She is to do no heavy lifting greater than 20 pounds  for six weeks.  Home health is arranged for b.i.d. wet-to-dry dressing  changes.   She will resume her home medications and take Keflex three times a day for  seven days.  She will take Percocet and ibuprofen for pain and a  multivitamin.  She will follow up in my office in a week.                                               Abigail Miyamoto, M.D.    DB/MEDQ  D:  12/23/2002  T:  12/23/2002  Job:  [id number]

## 2010-07-30 NOTE — Discharge Summary (Signed)
[facility]  Patient:    Gina Shelton, Gina Shelton                     MRN: [id number] Adm. Date:  [id number] Disc. Date: [id number] Attending:  Michaelle Copas CC:         Roseanna Rainbow, M.D.                           Discharge Summary  DATE OF BIRTH:                [birth year].  LOCATION:                     Admitted to the [facility] Teaching [facility].  ATTENDING:                    Conni Elliot, M.D.  PROCEDURES:                   Complete transvaginal and pelvic ultrasound.  IMPRESSION:                   Bilateral tubo-ovarian abscesses with the                               right ovary measuring 1.7 x 1.4 x 1.9 and the                               left ovary measuring 4.0 x 2.5 x 2.9 cm. There                               is an associated moderate amount of complex                               fluid in the pelvis.  DISCHARGE DIAGNOSES:          1. Pelvic inflammatory disease.                               2. Bilateral tubo-ovarian abscesses.                               3. Gonorrhea infection.                               4. Chlamydia infection.                               5. Alcohol abuse.                               6. Tobacco abuse.                               7. Depressive disorder.  8. Esophageal reflux.  DISCHARGE MEDICATIONS:        1. Doxycycline 100 mg p.o. b.i.d. x 14 days.                               2. Flagyl 500 mg p.o. b.i.d. x 14 days.  LABORATORY DATA:              GC positive, chlamydia positive, hepatitis B. negative, HIV nonreactive, syphilis nonreactive.  On admission, white blood count was 12.9, hemoglobin and hematocrit were 12.4 and 36.6, platelets were 206,000.  Wet prep revealed moderate white blood cells and moderate bacteria. Urine pregnancy was negative and urinalysis was negative for infection.  HISTORY OF PRESENT ILLNESS:   Briefly, Ms. Gina Shelton is a 35 year old G2,  P1-1-0-2 who presented to the MAU complaining of a thick yellow vaginal discharge with bad odor.  She also had a long history of abdominal pain and had been diagnosed with an ovarian abscess associated with PID. in the past.  She described the pain as similar and it had been worse for the past three days. She also complained of urinary frequency, urgency, dribbling but denied dysuria, vaginal itching or vaginal burning.  After ultrasound revealed bilateral tubo-ovarian abscesses, she was admitted for IV antibiotics.  HOSPITAL COURSE:              Ms. Gina Shelton received seven days of IV ampicillin/gentamicin/clindamycin.  Although she initially required Percocet for pain, she was pain free for 48 hours prior to discharge.  She also had problems with nausea requiring Phenergan but denied nausea for the 48 hours prior to discharge as well.  She did complain of intermittent vaginal bleeding/spotting.  This was felt to be consistent with an endometritis secondary to her pelvic inflammatory disease.  She ran elevated temperatures of 99.2 to 99.8 during the first three days of admission but was afebrile for 48 hours prior to discharge.  DISCHARGE INSTRUCTIONS:       Patient was instructed not to use alcohol while taking Flagyl and educated about the side affects of that interaction.  She is to return to the clinic or hospital if she has worsening abdominal pain or elevated temperature.  There are no restrictions.  FOLLOWUP:                     She will see Dr. Tamela Oddi at the gynecology clinic at [facility] on Friday, January 25 at 9:45. DD:  03/27/00 TD:  03/27/00 Job: [id number]

## 2010-07-30 NOTE — Op Note (Signed)
[facility]  Patient:    Gina Shelton, Gina Shelton                     MRN: [id number] Adm. Date:  [id number] Attending:  Michaelle Copas                           Operative Report  PREOPERATIVE DIAGNOSIS:       Intrauterine pregnancy at [redacted] weeks gestation                               complicated by preterm labor, positive group B                               Streptococcus status and probable placenta                               abruption.  POSTOPERATIVE DIAGNOSIS:      Intrauterine pregnancy at [redacted] weeks gestation                               complicated by preterm labor, positive group B                               Streptococcus status and probable placenta                               abruption.  Breech presentation.  OPERATION:                    Low transverse cesarean section.  SURGEON:                      Conni Elliot, M.D.  OPERATIVE FINDINGS:           Female infant with Apgars of 5 and 8 at one and five minutes.  Cord pH was 7.  Placenta was sent to pathology.  The placenta was immediately delivered upon delivery of the fetus consistent with placenta abruption.  DESCRIPTION OF PROCEDURE:     After bringing spinal anesthetic to an operative level, the patient supine in left lateral tilt position, receiving oxygen and was prepped and draped in sterile fashion.  A low transverse Pfannenstiel incision as made.  Incision made into the skin, fascia, rectus muscles separated in the midline, peritoneal cavity. Bladder flap created.  Low transverse uterine incision was made.  The fetus was delivered from a complete breech extraction without difficulty.  There was no entrapment of fetal head.  Cord was doubly clamped and cut and the baby was handed to the neonatologist in attendance.  The placenta was entered spontaneously, very rapidly after delivery of the fetus. The uterus, bladder flap, anterior peritoneum, fascia closed in  the usual fashion. Estimated blood loss was approximately 800 cc.  Needle and sponge count correct. DD:  06/05/99 TD:  06/07/99 Job: [id number]

## 2010-07-30 NOTE — Consult Note (Signed)
   NAME:  BURNIS, KASER                        ACCOUNT NO.:  [id number]   MEDICAL RECORD NO.:  [id number]                   PATIENT TYPE:  INP   LOCATION:  9306                                 FACILITY:  [facility]   PHYSICIAN:  Abigail Miyamoto, M.D.              DATE OF BIRTH:  [birth year]   DATE OF CONSULTATION:  09/28/2002  DATE OF DISCHARGE:                                   CONSULTATION   CHIEF COMPLAINT:  Facial dehiscence.   HISTORY:  Ms. Gina Shelton is a 35 year old female who had undergone a  cesarean section emergently approximately one week ago.  She now presents  with nausea, vomiting, and a small opening in her midline wound with  serosanguinous drainage and findings worrisome for fascial dehiscence.  She  is otherwise healthy and has been mildly constipated.   PAST MEDICAL HISTORY:  Negative for diabetes.   PAST SURGICAL HISTORY:  The above cesarean section.   ALLERGIES:  She has no known drug allergies.   MEDICATIONS:  She is currently taking no medications.   REVIEW OF SYSTEMS:  Otherwise unremarkable.   PHYSICAL EXAMINATION:  GENERAL:  Mildly obese female in mild discomfort.  VITAL SIGNS:  She is afebrile.  Vital signs are stable.  LUNGS:  Clear to auscultation bilaterally.  Appear normal.  CARDIOVASCULAR:  Mildly tachycardic with regular rhythm and no murmurs.  ABDOMEN:  Mildly obese and distended.  There is an opening over part of her  midline incision which when probed appears to show a fascial dehiscence.  There is serosanguineous fluid draining from the incision.  The abdomen is  mildly tender.   X-RAY DATA:  The patient has an abdominal x-ray that showed dilated small  bowel and air fluid levels and free air.   IMPRESSION AND PLAN:  This is a 35 year old female status post cesarean  section with fascial dehiscence.  At this point, the plan is to go to the  operating room for exploratory laparotomy and repair and closure of the  abdominal wall fascial.   I discussed this with the patient and her family in  detail.  We discussed the risk of bleeding, infection, need for retention  sutures, possible bowel injury, etc.  At this point, she wished to proceed.  Surgery will thus be performed emergently.                                               Abigail Miyamoto, M.D.    DB/MEDQ  D:  09/29/2002  T:  09/29/2002  Job:  [id number]

## 2010-07-30 NOTE — Discharge Summary (Signed)
NAME:  Gina Shelton, Gina Shelton                        ACCOUNT NO.:  [id number]   MEDICAL RECORD NO.:  [id number]                   PATIENT TYPE:  INP   LOCATION:  9135                                 FACILITY:  [facility]   PHYSICIAN:  Phil D. Okey Dupre, M.D.                  DATE OF BIRTH:  31-Jan-1976   DATE OF ADMISSION:  06/14/2002  DATE OF DISCHARGE:  06/16/2002                                 DISCHARGE SUMMARY   DISCHARGE DIAGNOSES:  1. Intrauterine pregnancy at [redacted] weeks gestation.  2. Preterm contractions.  3. Chest pain, likely secondary to gastroesophageal reflux disease.   DISCHARGE MEDICATIONS:  1. Prenatal vitamins.  2. Zantac 150 mg p.o. b.i.d.   PROCEDURES WHILE IN HOSPITAL:   FOLLOW-UP:  The patient is to follow up at the high risk clinic on  Wednesday, June 19, 2002.   PRESENTING HISTORY:  This is a 35 year old G3 P1-1-0-2 who presented to the  MAU at 23 weeks six days gestation dated by a 13-week ultrasound who  complained of feeling pressure in her abdomen, back, and vaginal area.  She  stated she felt like she was having contractions, although no bleeding, no  rupture of membranes.  The patient had been on bedrest per her physician's  orders and she was told that her cervix was thinning out at the high risk  clinic.  She also one week ago had a positive diagnosis of trichomonas.  The  patient was followed at the high risk clinic.   PREGNANCY RISKS:  1. The patient had a previous preterm delivery secondary to a placental     abruption at 28 weeks.  2. History of sexually-transmitted infections in both this pregnancy and     previous.  3. The patient is a smoker - five to six cigarettes per day.  4. History of multiple urinary tract infection.   ALLERGIES:  No known allergies.   OBSTETRICAL HISTORY:  The patient has had one term vaginal delivery.  The  patient's second pregnancy was low transverse C section at 28 weeks  secondary to placental abruption.   GYNECOLOGICAL  HISTORY:  History of multiple sexually-transmitted infections  and history of several urinary tract infections.   SURGICAL HISTORY:  Negative other than cesarean section.   SOCIAL HISTORY:  The patient does smoke five to six cigarettes per day.   FAMILY HISTORY:  Notable for her mother's cystic fibrosis.   PRENATAL LABORATORY DATA:  Blood type B positive, antibody screen negative.  Hematocrit 39.2, platelets 231.  Rubella immune.  Hepatitis B surface  antigen negative.  Syphilis negative.  HIV negative.  GC and chlamydia  negative.   PHYSICAL EXAMINATION:  VITAL SIGNS:  The patient had a normal blood pressure  at 124/75 and was afebrile.  GENERAL:  Within normal limits.  PELVIC:  The patient's cervix felt to be fingertip, long, and high.  The  fetal heart  rates were 150s with good variability.  The patient did seem to  be contracting every three to six minutes on presentation.   LABORATORY DATA:  Urinalysis  unremarkable.   ASSESSMENT AND PLAN:  Assessment at that time was a 35 year old G3 P1-1-0-2  with pain secondary to contractions.  She was admitted for observation and  treatment for preterm contractions.   HOSPITAL COURSE:  The patient was admitted, was given fluid bolus which  slowed down her contractions.  She underwent an obstetrical ultrasound which  showed a normal cervical length of 3.2 cm translabially and otherwise showed  an appropriately-grown single IUP in breech presentation.  On the evening of  admission the patient began to complain of severe chest pain for which she  had a set of cardiac enzymes and an EKG which were both normal.  She was  given a GI cocktail at that time which helped her pain considerably.  The  patient was monitored for another 24 hours and on cervical recheck the  patient's cervix was stable and long.  Therefore, she was discharged to  home.  She was to continue her Zantac for presumed GERD and follow up at the  high risk clinic.      Bradly Bienenstock, M.D.                         Phil D. Okey Dupre, M.D.    Arliss Journey  D:  06/16/2002  T:  06/17/2002  Job:  [id number]

## 2010-07-30 NOTE — Discharge Summary (Signed)
[facility]  Patient:    Gina Shelton, Gina Shelton                     MRN: [id number] Adm. Date:  [id number] Disc. Date: [id number] Attending:  Tammi Sou Dictator:   Nolon Nations, M.D.                           Discharge Summary  DATE OF BIRTH:                [birth year].  SERVICE:                      OB teaching service.  ADMISSION DIAGNOSIS:          Preterm labor.  DISCHARGE DIAGNOSIS:          Status post cesarean section for abruptio placentae.  ADMISSION HISTORY:            Gina Shelton is a 35 year old G2, P1-0-0-0 who presented at 31 weeks for uterine contractions every three to five minutes. She was noted to have previous contractions May 29, 1999, which were treated with terbutaline shot and Augmentin.  She did have a yellow, thick discharge on Wednesday, June 02, 1999, followed by a large amount of watery discharge. She had bleeding starting at noon the day of admission as well as vaginal and back pressure.  HOSPITAL COURSE:              The patient was admitted to L&D for preterm labor.  She was given IM terbutaline x 1 followed by magnesium Unasyn and betamethasone x 1.  She was known GBS positive. DIC panel was ordered.  The patient was taken to the OR for a probable abruptio on June 05, 1999, and had an LTCS without complications.  The patient had normal postoperative course and was discontinued home on June 08, 1999.  DISPOSITION:                  She was discharged in good condition.  FOLLOW-UP:                    1. MAU for staple removal June 09, 1999,                                  or June 10, 1999.                                2. Follow-up at six weeks, at which time the patient plans to have bilateral tubal ligation.   DISCHARGE MEDICATIONS:        1. Ibuprofen.                               2. Percocet.                               3. Iron sulfate.                               4. Prenatal  vitamins. DD:  12/07/99 TD:  12/08/99 Job: [id number]

## 2010-11-04 ENCOUNTER — Emergency Department (HOSPITAL_COMMUNITY)
Admission: EM | Admit: 2010-11-04 | Discharge: 2010-11-04 | Disposition: A | Discharge status: Home / Self Care | Payer: Medicaid Other | Attending: Emergency Medicine | Admitting: Emergency Medicine

## 2010-11-04 DIAGNOSIS — F341 Dysthymic disorder: Secondary | ICD-10-CM | POA: Insufficient documentation

## 2010-11-04 DIAGNOSIS — Z862 Personal history of diseases of the blood and blood-forming organs and certain disorders involving the immune mechanism: Secondary | ICD-10-CM | POA: Insufficient documentation

## 2010-11-04 DIAGNOSIS — Z8639 Personal history of other endocrine, nutritional and metabolic disease: Secondary | ICD-10-CM | POA: Insufficient documentation

## 2010-11-04 DIAGNOSIS — K089 Disorder of teeth and supporting structures, unspecified: Secondary | ICD-10-CM | POA: Insufficient documentation

## 2010-11-04 DIAGNOSIS — K219 Gastro-esophageal reflux disease without esophagitis: Secondary | ICD-10-CM | POA: Insufficient documentation

## 2010-11-04 DIAGNOSIS — F17200 Nicotine dependence, unspecified, uncomplicated: Secondary | ICD-10-CM | POA: Insufficient documentation

## 2010-11-13 ENCOUNTER — Emergency Department (HOSPITAL_COMMUNITY)
Admission: EM | Admit: 2010-11-13 | Discharge: 2010-11-14 | Disposition: A | Discharge status: Home / Self Care | Payer: Medicaid Other | Attending: Emergency Medicine | Admitting: Emergency Medicine

## 2010-11-13 ENCOUNTER — Emergency Department (HOSPITAL_COMMUNITY): Payer: Medicaid Other

## 2010-11-13 DIAGNOSIS — M779 Enthesopathy, unspecified: Secondary | ICD-10-CM | POA: Insufficient documentation

## 2010-11-13 DIAGNOSIS — F341 Dysthymic disorder: Secondary | ICD-10-CM | POA: Insufficient documentation

## 2010-11-13 DIAGNOSIS — M79609 Pain in unspecified limb: Secondary | ICD-10-CM | POA: Insufficient documentation

## 2010-11-13 DIAGNOSIS — Z862 Personal history of diseases of the blood and blood-forming organs and certain disorders involving the immune mechanism: Secondary | ICD-10-CM | POA: Insufficient documentation

## 2010-11-13 DIAGNOSIS — M25529 Pain in unspecified elbow: Secondary | ICD-10-CM | POA: Insufficient documentation

## 2010-11-13 DIAGNOSIS — K219 Gastro-esophageal reflux disease without esophagitis: Secondary | ICD-10-CM | POA: Insufficient documentation

## 2010-11-13 DIAGNOSIS — F17200 Nicotine dependence, unspecified, uncomplicated: Secondary | ICD-10-CM | POA: Insufficient documentation

## 2010-11-13 DIAGNOSIS — Z8639 Personal history of other endocrine, nutritional and metabolic disease: Secondary | ICD-10-CM | POA: Insufficient documentation

## 2010-12-10 LAB — CBC
HCT: 39.6
Hemoglobin: 13.3
MCHC: 33.6
MCV: 94.9
RBC: 4.17

## 2010-12-10 LAB — URINALYSIS, ROUTINE W REFLEX MICROSCOPIC
Bilirubin Urine: NEGATIVE
Glucose, UA: NEGATIVE
Ketones, ur: NEGATIVE
Protein, ur: NEGATIVE
pH: 6

## 2010-12-10 LAB — COMPREHENSIVE METABOLIC PANEL
CO2: 25
Calcium: 9
Chloride: 108
Creatinine, Ser: 0.63
GFR calc non Af Amer: >60
Glucose, Bld: 96
Total Bilirubin: 0.8

## 2010-12-10 LAB — DIFFERENTIAL
Basophils Absolute: 0.1
Eosinophils Absolute: 0.1
Lymphocytes Relative: 30
Lymphs Abs: 2.5
Neutrophils Relative %: 59

## 2010-12-10 LAB — WET PREP, GENITAL
Clue Cells Wet Prep HPF POC: NONE SEEN
Trich, Wet Prep: NONE SEEN
WBC, Wet Prep HPF POC: NONE SEEN
Yeast Wet Prep HPF POC: NONE SEEN

## 2010-12-10 LAB — SAMPLE TO BLOOD BANK

## 2010-12-10 LAB — URINE MICROSCOPIC-ADD ON

## 2010-12-13 LAB — COMPREHENSIVE METABOLIC PANEL
AST: 27
Albumin: 3.8
BUN: 7
Calcium: 10
Chloride: 106
Creatinine, Ser: 0.6
GFR calc Af Amer: >60
Total Bilirubin: 0.6
Total Protein: 8.1

## 2010-12-13 LAB — URINALYSIS, ROUTINE W REFLEX MICROSCOPIC
Bilirubin Urine: NEGATIVE
Glucose, UA: NEGATIVE
Nitrite: NEGATIVE
Specific Gravity, Urine: 1.03
pH: 6.5

## 2010-12-13 LAB — URINE MICROSCOPIC-ADD ON

## 2010-12-13 LAB — DIFFERENTIAL
Basophils Absolute: 0
Eosinophils Relative: 1
Lymphocytes Relative: 35
Lymphs Abs: 2.7
Monocytes Absolute: 0.6
Neutro Abs: 4.5

## 2010-12-13 LAB — CBC
HCT: 42.5
MCHC: 33
MCV: 92.6
Platelets: 334
RDW: 14.8
WBC: 8

## 2010-12-24 LAB — URINALYSIS, ROUTINE W REFLEX MICROSCOPIC
Glucose, UA: NEGATIVE
Glucose, UA: NEGATIVE
Hgb urine dipstick: NEGATIVE
Ketones, ur: NEGATIVE
Ketones, ur: NEGATIVE
Protein, ur: 30 — AB
Urobilinogen, UA: 1
pH: 6.5

## 2010-12-24 LAB — COMPREHENSIVE METABOLIC PANEL
ALT: 18
AST: 18
Alkaline Phosphatase: 67
CO2: 25
Chloride: 109
GFR calc Af Amer: >60
GFR calc non Af Amer: >60
Glucose, Bld: 99
Potassium: 4.3
Sodium: 141
Total Bilirubin: 0.4

## 2010-12-24 LAB — POCT PREGNANCY, URINE
Preg Test, Ur: NEGATIVE
Preg Test, Ur: NEGATIVE

## 2010-12-24 LAB — DIFFERENTIAL
Basophils Relative: 2 — ABNORMAL HIGH
Eosinophils Absolute: 0.1
Eosinophils Relative: 1
Lymphs Abs: 2.3
Neutrophils Relative %: 57

## 2010-12-24 LAB — POCT URINALYSIS DIP (DEVICE)
Bilirubin Urine: NEGATIVE
Nitrite: NEGATIVE
Operator id: 247071
Protein, ur: NEGATIVE
pH: 7

## 2010-12-24 LAB — WET PREP, GENITAL
Clue Cells Wet Prep HPF POC: NONE SEEN
Yeast Wet Prep HPF POC: NONE SEEN

## 2010-12-24 LAB — CBC
Hemoglobin: 13.2
MCHC: 33.9
RBC: 4.03
WBC: 7.2

## 2010-12-24 LAB — URINE MICROSCOPIC-ADD ON

## 2010-12-24 LAB — GC/CHLAMYDIA PROBE AMP, GENITAL: GC Probe Amp, Genital: NEGATIVE

## 2010-12-24 LAB — LIPASE, BLOOD: Lipase: 29

## 2011-03-02 ENCOUNTER — Encounter (HOSPITAL_COMMUNITY): Payer: Self-pay | Admitting: *Deleted

## 2011-03-02 ENCOUNTER — Inpatient Hospital Stay (HOSPITAL_COMMUNITY)
Admission: AD | Admit: 2011-03-02 | Discharge: 2011-03-04 | DRG: 885 | Disposition: A | Discharge status: Home / Self Care | Payer: Medicaid Other | Source: Ambulatory Visit | Attending: Psychiatry | Admitting: Psychiatry

## 2011-03-02 ENCOUNTER — Emergency Department (EMERGENCY_DEPARTMENT_HOSPITAL)
Admission: EM | Admit: 2011-03-02 | Discharge: 2011-03-02 | Disposition: A | Discharge status: Other Acute Inpatient Hospital | Payer: Medicaid Other | Source: Home / Self Care | Attending: Emergency Medicine | Admitting: Emergency Medicine

## 2011-03-02 ENCOUNTER — Encounter: Payer: Self-pay | Admitting: *Deleted

## 2011-03-02 DIAGNOSIS — K219 Gastro-esophageal reflux disease without esophagitis: Secondary | ICD-10-CM

## 2011-03-02 DIAGNOSIS — Z79899 Other long term (current) drug therapy: Secondary | ICD-10-CM

## 2011-03-02 DIAGNOSIS — F411 Generalized anxiety disorder: Secondary | ICD-10-CM

## 2011-03-02 DIAGNOSIS — F329 Major depressive disorder, single episode, unspecified: Secondary | ICD-10-CM

## 2011-03-02 DIAGNOSIS — F333 Major depressive disorder, recurrent, severe with psychotic symptoms: Principal | ICD-10-CM

## 2011-03-02 DIAGNOSIS — M109 Gout, unspecified: Secondary | ICD-10-CM

## 2011-03-02 DIAGNOSIS — G43909 Migraine, unspecified, not intractable, without status migrainosus: Secondary | ICD-10-CM

## 2011-03-02 DIAGNOSIS — R45851 Suicidal ideations: Secondary | ICD-10-CM

## 2011-03-02 HISTORY — DX: Depression, unspecified: F32.A

## 2011-03-02 HISTORY — DX: Gastro-esophageal reflux disease without esophagitis: K21.9

## 2011-03-02 HISTORY — DX: Major depressive disorder, single episode, unspecified: F32.9

## 2011-03-02 LAB — COMPREHENSIVE METABOLIC PANEL
BUN: 6 mg/dL (ref 6–23)
CO2: 23 mEq/L (ref 19–32)
Chloride: 107 mEq/L (ref 96–112)
Creatinine, Ser: 0.68 mg/dL (ref 0.50–1.10)
GFR calc Af Amer: >90 mL/min (ref >90)
GFR calc non Af Amer: >90 mL/min (ref >90)
Glucose, Bld: 97 mg/dL (ref 70–99)
Total Bilirubin: 0.2 mg/dL — ABNORMAL LOW (ref 0.3–1.2)

## 2011-03-02 LAB — HCG, QUANTITATIVE, PREGNANCY: hCG, Beta Chain, Quant, S: <1 m[IU]/mL (ref <5)

## 2011-03-02 LAB — ETHANOL: Alcohol, Ethyl (B): 144 mg/dL — ABNORMAL HIGH (ref 0–11)

## 2011-03-02 LAB — CBC
HCT: 42 % (ref 36.0–46.0)
MCV: 96.1 fL (ref 78.0–100.0)
RBC: 4.37 MIL/uL (ref 3.87–5.11)
WBC: 7.7 10*3/uL (ref 4.0–10.5)

## 2011-03-02 LAB — RAPID URINE DRUG SCREEN, HOSP PERFORMED
Opiates: NOT DETECTED
Tetrahydrocannabinol: POSITIVE — AB

## 2011-03-02 MED ORDER — METOCLOPRAMIDE HCL 5 MG/ML IJ SOLN
10.0000 mg | Freq: Once | INTRAMUSCULAR | Status: AC
  Administered 2011-03-02: 10 mg via INTRAMUSCULAR
  Filled 2011-03-02 (×2): qty 2

## 2011-03-02 MED ORDER — SERTRALINE HCL 50 MG PO TABS
50.0000 mg | ORAL_TABLET | Freq: Two times a day (BID) | ORAL | Status: DC
  Administered 2011-03-02: 50 mg via ORAL
  Filled 2011-03-02: qty 1

## 2011-03-02 MED ORDER — NICOTINE 21 MG/24HR TD PT24
21.0000 mg | MEDICATED_PATCH | Freq: Every day | TRANSDERMAL | Status: DC
  Administered 2011-03-03 – 2011-03-04 (×2): 21 mg via TRANSDERMAL
  Filled 2011-03-02 (×4): qty 1

## 2011-03-02 MED ORDER — NICOTINE 21 MG/24HR TD PT24
21.0000 mg | MEDICATED_PATCH | Freq: Every day | TRANSDERMAL | Status: DC
Start: 2011-03-02 — End: 2011-03-02
  Administered 2011-03-02: 21 mg via TRANSDERMAL
  Filled 2011-03-02: qty 1

## 2011-03-02 MED ORDER — ALUM & MAG HYDROXIDE-SIMETH 200-200-20 MG/5ML PO SUSP: 30.0000 mL | ORAL | Status: DC | PRN

## 2011-03-02 MED ORDER — ALBUTEROL SULFATE HFA 108 (90 BASE) MCG/ACT IN AERS: 2.0000 | INHALATION_SPRAY | RESPIRATORY_TRACT | Status: DC | PRN

## 2011-03-02 MED ORDER — SUMATRIPTAN SUCCINATE 25 MG PO TABS
25.0000 mg | ORAL_TABLET | ORAL | Status: DC | PRN
  Filled 2011-03-02: qty 1

## 2011-03-02 MED ORDER — TRAZODONE HCL 50 MG PO TABS
50.0000 mg | ORAL_TABLET | Freq: Every evening | ORAL | Status: DC | PRN
  Administered 2011-03-02 – 2011-03-03 (×2): 50 mg via ORAL
  Filled 2011-03-02 (×3): qty 1

## 2011-03-02 MED ORDER — LORAZEPAM 1 MG PO TABS
1.0000 mg | ORAL_TABLET | Freq: Three times a day (TID) | ORAL | Status: DC | PRN
  Administered 2011-03-02: 1 mg via ORAL
  Filled 2011-03-02: qty 1

## 2011-03-02 MED ORDER — VENLAFAXINE HCL ER 75 MG PO CP24
225.0000 mg | ORAL_CAPSULE | Freq: Every day | ORAL | Status: DC
  Administered 2011-03-02: 225 mg via ORAL
  Filled 2011-03-02: qty 3

## 2011-03-02 MED ORDER — MAGNESIUM HYDROXIDE 400 MG/5ML PO SUSP
30.0000 mL | Freq: Every day | ORAL | Status: DC | PRN
  Administered 2011-03-04: 30 mL via ORAL

## 2011-03-02 MED ORDER — ONDANSETRON HCL 4 MG PO TABS: 4.0000 mg | ORAL_TABLET | Freq: Three times a day (TID) | ORAL | Status: DC | PRN

## 2011-03-02 MED ORDER — PANTOPRAZOLE SODIUM 40 MG PO TBEC
40.0000 mg | DELAYED_RELEASE_TABLET | Freq: Every day | ORAL | Status: DC
  Administered 2011-03-02: 40 mg via ORAL
  Filled 2011-03-02: qty 1

## 2011-03-02 MED ORDER — ACETAMINOPHEN 325 MG PO TABS
650.0000 mg | ORAL_TABLET | Freq: Four times a day (QID) | ORAL | Status: DC | PRN
  Administered 2011-03-03: 650 mg via ORAL

## 2011-03-02 MED ORDER — LORATADINE 10 MG PO TABS
10.0000 mg | ORAL_TABLET | Freq: Every day | ORAL | Status: DC
  Administered 2011-03-02: 10 mg via ORAL
  Filled 2011-03-02 (×2): qty 1

## 2011-03-02 NOTE — Tx Team (Addendum)
Initial Interdisciplinary Treatment Plan  PATIENT STRENGTHS: (choose at least two) Ability for insight Active sense of humor Average or above average intelligence Capable of independent living Communication skills General fund of knowledge Motivation for treatment/growth Supportive family/friends  PATIENT STRESSORS: Financial difficulties Loss of friend 2 months ago* Medication change or noncompliance   PROBLEM LIST: Problem List/Patient Goals Date to be addressed Date deferred Reason deferred Estimated date of resolution  Depression       Anxiety      SI                                           DISCHARGE CRITERIA:  Ability to meet basic life and health needs Adequate post-discharge living arrangements Improved stabilization in mood, thinking, and/or behavior Medical problems require only outpatient monitoring Motivation to continue treatment in a less acute level of care Need for constant or close observation no longer present Reduction of life-threatening or endangering symptoms to within safe limits Verbal commitment to aftercare and medication compliance  PRELIMINARY DISCHARGE PLAN: Outpatient therapy Return to previous living arrangement Return to previous work or school arrangements  PATIENT/FAMIILY INVOLVEMENT: This treatment plan has been presented to and reviewed with the patient, Gina Shelton.  The patient has been given the opportunity to ask questions and make suggestions.  Arturo Morton 03/02/2011, 9:17 PM

## 2011-03-02 NOTE — ED Notes (Signed)
Called back to [facility] and gave report to RN. Pt leaving unit with hospital security as transport and is accompanied by tech. Remains A&Ox4 and cooperative with staff. Departs in stable and safe condition.

## 2011-03-02 NOTE — ED Provider Notes (Signed)
History     CSN: [id number] Arrival date & time: 03/02/2011  1:45 AM   First MD Initiated Contact with Patient 03/02/11 0309      Chief Complaint  Patient presents with  . Medical Clearance  . Suicidal    (Consider location/radiation/quality/duration/timing/severity/associated sxs/prior treatment) HPI History provided by pt.   Pt has h/o depression and sx have been severe over the past 2 weeks.  Has recently developed SI.  Does not have a plan.  Has attempted suicide once in the past.  Was on Zoloft a year ago and it was controlling symptoms but could not afford after losing her insurance.  Does not currently have a psychiatrist.  Drinks 2-3 beers on a daily basis.  Has never experienced withdrawal sx.  No drug abuse.    Past Medical History  Diagnosis Date  . Depression   . GERD (gastroesophageal reflux disease)   . Migraine     History reviewed. No pertinent past surgical history.  History reviewed. No pertinent family history.  History  Substance Use Topics  . Smoking status: Current Everyday Smoker  . Smokeless tobacco: Not on file  . Alcohol Use: Yes    OB History    Grav Para Term Preterm Abortions TAB SAB Ect Mult Living                  Review of Systems  All other systems reviewed and are negative.    Allergies  Review of patient's allergies indicates no known allergies.  Home Medications   Current Outpatient Rx  Name Route Sig Dispense Refill  . NAPROXEN SODIUM 550 MG PO TABS Oral Take 550 mg by mouth 2 (two) times daily. For severe migraine      . SERTRALINE HCL 50 MG PO TABS Oral Take 50 mg by mouth 2 (two) times daily.      . SUMATRIPTAN SUCCINATE 25 MG PO TABS Oral Take 25 mg by mouth every 2 (two) hours as needed. Onset of migraine     . ALBUTEROL SULFATE HFA 108 (90 BASE) MCG/ACT IN AERS Inhalation Inhale 2 puffs into the lungs every 4 (four) hours as needed. Every 4-6 hours     . CETIRIZINE HCL 10 MG PO TABS Oral Take 10 mg by mouth daily.       Marland Kitchen OMEPRAZOLE 40 MG PO CPDR Oral Take 40 mg by mouth daily.      . VENLAFAXINE HCL 225 MG PO TB24 Oral Take by mouth daily.        BP 143/98  Pulse 109  Temp(Src) 98.3 F (36.8 C) (Oral)  Resp 20  SpO2 100%  Physical Exam  Nursing note and vitals reviewed. Constitutional: She is oriented to person, place, and time. She appears well-developed and well-nourished. No distress.  HENT:  Head: Normocephalic and atraumatic.  Eyes:       Normal appearance  Neck: Normal range of motion.  Neurological: She is alert and oriented to person, place, and time.  Psychiatric: Her behavior is normal.       Depressed    ED Course  Procedures (including critical care time)  Labs Reviewed  COMPREHENSIVE METABOLIC PANEL - Abnormal; Notable for the following:    Potassium 3.4 (*)    Total Bilirubin 0.2 (*)    All other components within normal limits  ETHANOL - Abnormal; Notable for the following:    Alcohol, Ethyl (B) 144 (*)    All other components within normal limits  URINE RAPID  DRUG SCREEN (HOSP PERFORMED) - Abnormal; Notable for the following:    Cocaine POSITIVE (*)    Tetrahydrocannabinol POSITIVE (*)    All other components within normal limits  CBC   No results found.   1. Depression   2. Suicidal ideation       MDM  Pt presents w/ depression and SI.  Has been off of zoloft for 82yr and does not have a psychiatrist.  Cleared medically.  Pt moved to psych ED, holding orders written and ACT team consulted.  Pt received reglan for typical migraine headache.          Otilio Miu, [place] 03/02/11 [phone]

## 2011-03-02 NOTE — BH Assessment (Signed)
Pt's assessment is updated for today by prior staff Gina Shelton. This note is a update regarding patients disposition. Pt evaluated by psychiatrist (Dr. Eulogio Shelton) today 03-02-2011 to assess pt's depression symptoms and anxious mood. Per psychiatrist note: Pt reports she called [facility] because she was worried she might harm herself but she wasn't able to give any details as to what self-harm might entail. The consulting psychiatrist noted patients depressive symptoms as  "anhedonia, tearfulness, despair, sadness, fatigue, lack of appetite, insomnia, and isolating from family."  Furthermore, pt discussed with the psychiatrist that her depressive symptoms increased when her friend died 2 mos ago. Pt denies HI. Pt reports auditory hallucinations in which a voice tells her to "be mean" to other people. Pt doesn't report a particular victim nor does she has violent history.   This Clinical research associate was informed by Dr. Eulogio Shelton that patient was accepted to [facility]. Contacted Gina Shelton and requested bed for this patient. This writer was informed that pt's bed assignment is 508-1 by Dr. Rogers Shelton. Contacted the EDP and pt's nurse to make them both aware of patients disposition. Nurse-Gina Shelton will complete nurse to nurse report prior to discharge. Gina Shelton will also complete further arrangements needed. Pt's support paperwork completed and faxed to [facility] for logging purposes.  Gina Ripple, MS (Asssessment Counselor)

## 2011-03-02 NOTE — ED Provider Notes (Signed)
  Physical Exam  BP 127/89  Pulse 76  Temp(Src) 98 F (36.7 C) (Oral)  Resp 20  SpO2 100%  Physical Exam  ED Course  Procedures  MDM Sleeping comfortably, vitals stable.  No acute issues      Glynn Octave, MD 03/02/11 [phone]

## 2011-03-02 NOTE — ED Provider Notes (Signed)
Medical screening examination/treatment/procedure(s) were performed by non-physician practitioner and as supervising physician I was immediately available for consultation/collaboration.   Mikala Podoll, MD 03/02/11 2047 

## 2011-03-02 NOTE — ED Notes (Signed)
Patient accepted at [facility].  Glynn Octave, MD 03/02/11 [phone]

## 2011-03-02 NOTE — ED Notes (Signed)
Pt in c/o SI, pt tearful in triage stating she doesn't want to be here anymore, states she hates herself and her life. States she should be taking zoloft for depression but has been off of it.  Admits to ETOH abuse.

## 2011-03-02 NOTE — ED Notes (Signed)
Sitter remains bedside, cont to monitor, awaiting disposition

## 2011-03-02 NOTE — ED Notes (Signed)
Pt vomiting in triage, PA to be notified

## 2011-03-02 NOTE — BH Assessment (Signed)
Assessment Note    Gina Shelton is a 35 y.o. female who presents at [facility] with depressed and anxious mood. Pt reports she called [facility] because she was worried she might harm herself but she wasn't able to give any detail as to what self-harm might entail. Depressive symptoms include anhedonia, tearfulness, despair, sadness, fatigue, lack of appetite, insomnia, and isolating from family. Pt reports depressive symptoms increased when her friend died 2 mos ago. Anxiety symptoms include panic attacks 2-3 times per week, "butterflies" in her stomach, and a racing heartbeat.   Pt denies HI. Pt reports auditory hallucinations in which a voice tells her to "be mean" to other people. Pt doesn't report a particular victim nor does she has violent history. Pt states she sometimes beats her head w/ her fists to try to stop the voice. Pt unable to state whether voice is female or female. Pt reports occasional visual hallucinations re: seeing shadows out of the corner of her eyes but no one/nothing is visible near her. Pt states that her AV hallucinations disappeared when she was on antidepressant meds that worked a year ago, prior to her losing health insurance.  Pt states she is waiting to receive Medicaid card and then she will contact a psychiatrist to begin mental health treatment again.   Pt denies substance use or abuse, despite lab results. Pt reports her migraines are occurring more frequently than normal.   Axis I: Major Depressive Disorder, Severe w/ Psychotic Features Axis II: Deferred Axis III:  Past Medical History  Diagnosis Date  . Depression   . GERD (gastroesophageal reflux disease)   . Migraine    Axis IV: other psychosocial or environmental problems, problems related to social environment and problems with primary support group  Axis V: 31-40 impairment in reality testing  Past Medical History:  Past Medical History  Diagnosis Date  . Depression   . GERD (gastroesophageal  reflux disease)   . Migraine     History reviewed. No pertinent past surgical history.  Family History: History reviewed. No pertinent family history.  Social History:  reports that she has been smoking.  She does not have any smokeless tobacco history on file. She reports that she drinks alcohol. She reports that she does not use illicit drugs.  Additional Social History:  Alcohol / Drug Use History of alcohol / drug use?: Yes Substance #1 Name of Substance 1: Alcohol 1 - Age of First Use: 21 1 - Amount (size/oz): 12 oz 1 - Frequency: once a month 1 - Duration: states she rarely drinks anymore but for several years she drank heavily (5 beers 3-4 x per week) 1 - Last Use / Amount: 03/01/2011 states she drank only one beer Allergies: No Known Allergies  Home Medications:  Medications Prior to Admission  Medication Dose Route Frequency Provider Last Rate Last Dose  . albuterol (PROVENTIL HFA;VENTOLIN HFA) 108 (90 BASE) MCG/ACT inhaler 2 puff  2 puff Inhalation Q4H PRN Otilio Miu, PA      . alum & mag hydroxide-simeth (MAALOX/MYLANTA) 200-200-20 MG/5ML suspension 30 mL  30 mL Oral PRN Otilio Miu, PA      . loratadine (CLARITIN) tablet 10 mg  10 mg Oral Daily Otilio Miu, PA      . LORazepam (ATIVAN) tablet 1 mg  1 mg Oral Q8H PRN Otilio Miu, PA      . metoCLOPramide (REGLAN) injection 10 mg  10 mg Intramuscular Once Otilio Miu, PA      .  nicotine (NICODERM CQ - dosed in mg/24 hours) patch 21 mg  21 mg Transdermal Daily Otilio Miu, PA      . ondansetron (ZOFRAN) tablet 4 mg  4 mg Oral Q8H PRN Otilio Miu, PA      . pantoprazole (PROTONIX) EC tablet 40 mg  40 mg Oral Q1200 Otilio Miu, PA      . sertraline (ZOLOFT) tablet 50 mg  50 mg Oral BID Otilio Miu, PA      . SUMAtriptan (IMITREX) tablet 25 mg  25 mg Oral Q2H PRN Otilio Miu, PA      . venlafaxine (EFFEXOR-XR) 24 hr  capsule 225 mg  225 mg Oral Daily Otilio Miu, PA       Medications Prior to Admission  Medication Sig Dispense Refill  . naproxen sodium (ANAPROX) 550 MG tablet Take 550 mg by mouth 2 (two) times daily. For severe migraine        . albuterol (PROVENTIL HFA) 108 (90 BASE) MCG/ACT inhaler Inhale 2 puffs into the lungs every 4 (four) hours as needed. Every 4-6 hours       . cetirizine (ZYRTEC) 10 MG tablet Take 10 mg by mouth daily.        Marland Kitchen omeprazole (PRILOSEC) 40 MG capsule Take 40 mg by mouth daily.        . Venlafaxine HCl 225 MG TB24 Take by mouth daily.          OB/GYN Status:  No LMP recorded.  General Assessment Data Location of Assessment: [facility] Living Arrangements: Children;Relatives (sister and pt's 3 children) Can pt return to current living arrangement?: Yes Admission Status: Voluntary Is patient capable of signing voluntary admission?: Yes Transfer from: Acute Hospital Referral Source: Self/Family/Friend  Education Status Is patient currently in school?: No  Risk to self Suicidal Ideation: Yes-Currently Present Suicidal Intent: No Is patient at risk for suicide?: No Suicidal Plan?: No Access to Means: No What has been your use of drugs/alcohol within the last 12 months?: see social history Previous Attempts/Gestures: Yes How many times?: 1  Triggers for Past Attempts: None known Intentional Self Injurious Behavior: None Family Suicide History: Yes (aunt set herself on fire) Recent stressful life event(s): Loss (Comment) (close friend passed away 2 mos ago) Persecutory voices/beliefs?: No Depression: Yes Depression Symptoms: Despondent;Insomnia;Tearfulness;Isolating;Fatigue;Guilt;Loss of interest in usual pleasures;Feeling worthless/self pity Substance abuse history and/or treatment for substance abuse?: No  Risk to Others Homicidal Ideation: No Thoughts of Harm to Others: No Current Homicidal Intent: No Current Homicidal Plan: No Access to  Homicidal Means: No History of harm to others?: No Does patient have access to weapons?: No Criminal Charges Pending?: No Does patient have a court date: No  Psychosis Hallucinations: Auditory;Visual Delusions: None noted  Mental Status Report Appear/Hygiene: Poor hygiene Eye Contact: Good Motor Activity: Freedom of movement;Unremarkable Speech: Logical/coherent Level of Consciousness: Alert;Crying Mood: Depressed;Anxious;Anhedonia;Despair Affect: Sad;Appropriate to circumstance;Depressed Anxiety Level: Panic Attacks Panic attack frequency: 3 per week Most recent panic attack: this week Thought Processes: Coherent;Relevant Judgement: Impaired Orientation: Person;Place;Time;Situation;Appropriate for developmental age Obsessive Compulsive Thoughts/Behaviors: None  Cognitive Functioning Concentration: Normal Memory: Recent Intact;Remote Intact IQ: Average Insight: Fair Impulse Control: Fair Appetite: Poor Weight Loss: 40  Sleep: No Change Total Hours of Sleep: 3   Prior Inpatient Therapy Prior Inpatient Therapy: No  Prior Outpatient Therapy Prior Outpatient Therapy: No  ADL Screening (condition at time of admission) Patient's cognitive ability adequate to safely complete daily activities?: Yes Patient able  to express need for assistance with ADLs?: Yes Independently performs ADLs?: Yes Weakness of Legs: None Weakness of Arms/Hands: None  Home Assistive Devices/Equipment Home Assistive Devices/Equipment: None    Abuse/Neglect Assessment (Assessment to be complete while patient is alone) Physical Abuse: Denies Verbal Abuse: Denies Sexual Abuse: Denies Exploitation of patient/patient's resources: Denies Self-Neglect: Denies Values / Beliefs Cultural Requests During Hospitalization: None Spiritual Requests During Hospitalization: None        Additional Information 1:1 In Past 12 Months?: No CIRT Risk: No Elopement Risk: No Does patient have medical  clearance?: Yes     Disposition:  Disposition Disposition of Patient: Outpatient treatment Type of outpatient treatment: Psych Intensive Outpatient Pt may also benefit from inpatient treatment in order to reduce depressive symptoms which would increase pt's safety.  On Site Evaluation by:   Reviewed with Physician:     Donnamarie Rossetti P 03/02/2011 5:58 AM

## 2011-03-02 NOTE — ED Notes (Signed)
Awaiting [facility] RN to call for report.

## 2011-03-02 NOTE — ED Notes (Signed)
After several attempts Spoke w/[facility] to give report, informed that RN would return call in a few minutes to get report

## 2011-03-02 NOTE — ED Notes (Signed)
Sitter at bedside.

## 2011-03-02 NOTE — Progress Notes (Signed)
35yo female who presents voluntarily and in no acute distress for the treatment of SI, Depression and Anxiety. Appears flat and depressed. Calm and cooperative with assessment. A/Ox4. States she has been increasingly depressed and anxious r/t an unexpected passing of a friend by gunshot. States she has been increasingly depressed, hopeless, self isolating, anxious, poor sleep and poor concentration. Has not been compliant with medications r/t getting a new physician and not being able to afford going in to see him/her. Is working on getting her medicaide card. Medical h/o GERD, BLE gout and h/o migraines. H/o of suicide attempt by OD. H/O AH; voices telling her to do bad things to self and others. [id number] and people standing in her peripheral vision, but when she faces them they are gone. Denies SI/HI/AVH on assessment. Has three children and has never been married. Unit policies and expectations reviewed and understanding verbalized. Consents obtained. Skin assessed by Brodhead Bing and found to be clear apart from tattoos. Q9minute observation intitiated at 2100. Escorted to unit and oriented by Gwenyth Ober, MHT.

## 2011-03-02 NOTE — Consult Note (Addendum)
Patient Identification:  Gina Shelton Date of Evaluation:  03/02/2011   History of Present Illness:  35 y.o. female  reported depressed and anxious mood. Pt reports she called Family Services because she was worried she might harm herself but she wasn't able to give any detail as to what self-harm might entail. Depressive symptoms include anhedonia, tearfulness, despair, sadness, fatigue, lack of appetite, insomnia, and isolating from family. Pt reports depressive symptoms increased when her friend died 2 mos ago. Anxiety symptoms include panic attacks 2-3 times per week, "butterflies" in her stomach, and a racing heartbeat.  Pt denies HI. Pt reports auditory hallucinations in which a voice tells her to "be mean" to other people. Pt doesn't report a particular victim nor does she has violent history. Pt states she sometimes beats her head w/ her fists to try to stop the voice. Pt unable to state whether voice is female or female. Pt reports occasional visual hallucinations re: seeing shadows out of the corner of her eyes but no one/nothing is visible near her. Pt states that her AV hallucinations disappeared when she was on antidepressant meds that worked a year ago, prior to her losing health insurance   Patient is also using marijuana and cocaine on and off. Her UDS is positive for cocaine and marijuana. Patient is ready calm cooperative logical and goal-directed during the interview but she is depressed anxious and having suicidal ideations. Does not seem to be responding to the inner stimuli.  Patient was on Zoloft one year ago and responded well to it. Patient is agreeable to be started on the medication.  Past Medical History:     Past Medical History  Diagnosis Date  . Depression   . GERD (gastroesophageal reflux disease)   . Migraine       History reviewed. No pertinent past surgical history.  Filed Vitals:   03/02/11 0506  BP: 127/89  Pulse: 76  Temp: 98 F (36.7 C)  Resp:      Lab Results:   BMET    Component Value Date/Time   NA 140 03/02/2011 0204   K 3.4* 03/02/2011 0204   CL 107 03/02/2011 0204   CO2 23 03/02/2011 0204   GLUCOSE 97 03/02/2011 0204   BUN 6 03/02/2011 0204   CREATININE 0.68 03/02/2011 0204   CALCIUM 9.6 03/02/2011 0204   GFRNONAA >90 03/02/2011 0204   GFRAA >90 03/02/2011 0204    Allergies: No Known Allergies  Current Medications:  Prior to Admission medications   Medication Sig Start Date End Date Taking? Authorizing Provider  naproxen sodium (ANAPROX) 550 MG tablet Take 550 mg by mouth 2 (two) times daily. For severe migraine     Yes Historical Provider, MD  sertraline (ZOLOFT) 50 MG tablet Take 50 mg by mouth 2 (two) times daily.     Yes Historical Provider, MD  SUMAtriptan (IMITREX) 25 MG tablet Take 25 mg by mouth every 2 (two) hours as needed. Onset of migraine    Yes Historical Provider, MD  albuterol (PROVENTIL HFA) 108 (90 BASE) MCG/ACT inhaler Inhale 2 puffs into the lungs every 4 (four) hours as needed. Every 4-6 hours     Historical Provider, MD  cetirizine (ZYRTEC) 10 MG tablet Take 10 mg by mouth daily.      Historical Provider, MD  omeprazole (PRILOSEC) 40 MG capsule Take 40 mg by mouth daily.      Historical Provider, MD  Venlafaxine HCl 225 MG TB24 Take by mouth daily.  Historical Provider, MD    Social History:    reports that she has been smoking.  She does not have any smokeless tobacco history on file. She reports that she drinks alcohol. She reports that she does not use illicit drugs.   Family History:    History reviewed. No pertinent family history.   DIAGNOSIS:   AXIS I  Maj. depressive disorder recurrent type   AXIS II  Deffered  AXIS III See medical notes.  AXIS IV  noncompliance with medications   AXIS V 30     Recommendations:  Patient need inpatient stabilization.  I ordered a pregnancy test and after that patient can be started on Zoloft 100 mg by mouth daily if it will be  negative. I discontinued the Effexor because of the risk of serotonin syndrome.   Eulogio Ditch, MD

## 2011-03-03 MED ORDER — HYDROXYZINE HCL 25 MG PO TABS
25.0000 mg | ORAL_TABLET | Freq: Three times a day (TID) | ORAL | Status: DC | PRN
  Administered 2011-03-04: 25 mg via ORAL
  Filled 2011-03-03: qty 1

## 2011-03-03 MED ORDER — SERTRALINE HCL 100 MG PO TABS
100.0000 mg | ORAL_TABLET | Freq: Every day | ORAL | Status: DC
  Administered 2011-03-03 – 2011-03-04 (×2): 100 mg via ORAL
  Filled 2011-03-03 (×2): qty 1
  Filled 2011-03-03: qty 14
  Filled 2011-03-03: qty 1

## 2011-03-03 MED ORDER — CHLORPROMAZINE HCL 25 MG PO TABS
25.0000 mg | ORAL_TABLET | Freq: Once | ORAL | Status: AC
  Administered 2011-03-03: 25 mg via ORAL
  Filled 2011-03-03: qty 1

## 2011-03-03 MED ORDER — CHLORPROMAZINE HCL 25 MG PO TABS
25.0000 mg | ORAL_TABLET | Freq: Once | ORAL | Status: AC | PRN
  Filled 2011-03-03: qty 1

## 2011-03-03 MED ORDER — CHLORPROMAZINE HCL 25 MG PO TABS
25.0000 mg | ORAL_TABLET | Freq: Three times a day (TID) | ORAL | Status: DC
  Administered 2011-03-03 – 2011-03-04 (×4): 25 mg via ORAL
  Filled 2011-03-03 (×2): qty 1
  Filled 2011-03-03: qty 42
  Filled 2011-03-03: qty 1
  Filled 2011-03-03: qty 42
  Filled 2011-03-03 (×2): qty 1
  Filled 2011-03-03: qty 42

## 2011-03-03 NOTE — Progress Notes (Signed)
Patient ID: Gina Shelton, female   DOB: 05/16/1975, 35 y.o.   MRN: [id number] Pt is awake in bed this AM. Pt is tearful and anxious and c/o feeling nervous and depressed. Pt denies SI/HI but endorses auditory hallucinations of voices which tell her she is worthless. Pt is attending groups and is cooperative with staff. Pt mood and appearance are improving throughout the day. Writer will continue to monitor.

## 2011-03-03 NOTE — H&P (Signed)
Psychiatric Admission Assessment Adult  Patient Identification:  Gina Shelton Date of Evaluation:  03/03/2011 Chief Complaint:  MDD, Severe with Psychotic Features  History of Present Illness:: Patient is a 35 year old African-American female admitted from [facility] to [facility] with complaints of suicidal ideation. Patient reports "I have depression and anxiety. I have been depressed x 3 years and it is getting worse for the past one year. I stopped all my medications x 1 year ago because I lost my doctor. My doctor is Ms. Lafonda Mosses. I like her a lot. She knew me and listens to me. When she left the clinic where I used to go, I lost the desire to be on my medicines. I have 3 children. I am also going to school at [employer] to get my GED. I was busy about 2 weeks ago with school work and everything else. We are now out of school. I don't know what to do with myself. All I do is sit around the house looking at the 4 walls. Staying busy helps keep my mind out of my depression. I have suicidal thoughts. It is not like I am gonna kill myself, rather I am wishing I rather not be here. Why I am even here? I have a lot of anxiety with my depression. I feel very sad. I cry a lot for no reason. A good friend of mine recently passed away. That even made me become more mad because it does not make sense"  Mood Symptoms:  Anhedonia Depression Hopelessness Sadness SI Depression Symptoms:  depressed mood, feelings of worthlessness/guilt, hopelessness and anxiety (Hypo) Manic Symptoms:   Elevated Mood:  No Irritable Mood:  Yes Grandiosity:  No Distractibility:  No Labiality of Mood:  No Delusions:  No Hallucinations:  No Impulsivity:  No Sexually Inappropriate Behavior:  No Financial Extravagance:  No Flight of Ideas:  No  Anxiety Symptoms: Excessive Worry:  Yes Panic Symptoms:  No Agoraphobia:  No Obsessive Compulsive: No  Symptoms: None Specific Phobias:  No Social Anxiety:  No  Psychotic  Symptoms:  Hallucinations:  None Delusions:  No Paranoia:  No   Ideas of Reference:  No  PTSD Symptoms: Ever had a traumatic exposure:  No Had a traumatic exposure in the last month:  No Re-experiencing:  None Hypervigilance:  No Hyperarousal:  None Avoidance:  None  Traumatic Brain Injury:  Denies report of.  Past Psychiatric History: Diagnosis: Major depressive disorder  Hospitalizations: [facility]  Outpatient Care: Does not have one currently  Substance Abuse Care: None reported.  Self-Mutilation: Denies  Suicidal Attempts:Yes, remotely.  Violent Behaviors: Denies report   Past Medical History:   Past Medical History  Diagnosis Date  . Depression   . GERD (gastroesophageal reflux disease)   . Migraine   . Gout     ble   History of Loss of Consciousness:  No Seizure History:  No Cardiac History:  No  ROS: ED ROS on file reviewed and noted.  Allergies:  No Known Allergies Current Medications:  Current Facility-Administered Medications  Medication Dose Route Frequency Provider Last Rate Last Dose  . acetaminophen (TYLENOL) tablet 650 mg  650 mg Oral Q6H PRN Jorje Guild, PA   650 mg at 03/03/11 0830  . alum & mag hydroxide-simeth (MAALOX/MYLANTA) 200-200-20 MG/5ML suspension 30 mL  30 mL Oral Q4H PRN Jorje Guild, PA      . chlorproMAZINE (THORAZINE) tablet 25 mg  25 mg Oral Once Edwin Walker   25 mg  at 03/03/11 1418  . chlorproMAZINE (THORAZINE) tablet 25 mg  25 mg Oral Once PRN Orson Aloe      . hydrOXYzine (ATARAX/VISTARIL) tablet 25 mg  25 mg Oral TID PRN Sanjuana Kava, NP      . magnesium hydroxide (MILK OF MAGNESIA) suspension 30 mL  30 mL Oral Daily PRN Jorje Guild, PA      . nicotine (NICODERM CQ - dosed in mg/24 hours) patch 21 mg  21 mg Transdermal Q0600 Jorje Guild, PA   21 mg at 03/03/11 1610  . traZODone (DESYREL) tablet 50 mg  50 mg Oral QHS PRN Jorje Guild, PA   50 mg at 03/02/11 2252   Facility-Administered Medications Ordered in Other Encounters  Medication Dose  Route Frequency Provider Last Rate Last Dose  . DISCONTD: albuterol (PROVENTIL HFA;VENTOLIN HFA) 108 (90 BASE) MCG/ACT inhaler 2 puff  2 puff Inhalation Q4H PRN Otilio Miu, PA      . DISCONTD: alum & mag hydroxide-simeth (MAALOX/MYLANTA) 200-200-20 MG/5ML suspension 30 mL  30 mL Oral PRN Otilio Miu, PA      . DISCONTD: loratadine (CLARITIN) tablet 10 mg  10 mg Oral Daily Otilio Miu, PA   10 mg at 03/02/11 0942  . DISCONTD: LORazepam (ATIVAN) tablet 1 mg  1 mg Oral Q8H PRN Otilio Miu, PA   1 mg at 03/02/11 1531  . DISCONTD: nicotine (NICODERM CQ - dosed in mg/24 hours) patch 21 mg  21 mg Transdermal Daily Otilio Miu, PA   21 mg at 03/02/11 0943  . DISCONTD: ondansetron (ZOFRAN) tablet 4 mg  4 mg Oral Q8H PRN Otilio Miu, PA      . DISCONTD: pantoprazole (PROTONIX) EC tablet 40 mg  40 mg Oral Q1200 Otilio Miu, PA   40 mg at 03/02/11 1140  . DISCONTD: SUMAtriptan (IMITREX) tablet 25 mg  25 mg Oral Q2H PRN Otilio Miu, PA        Previous Psychotropic Medications:  Medication Dose                        Substance Abuse History in the last 12 months: Substance Age of 1st Use Last Use Amount Specific Type  Nicotine      Alcohol      Cannabis      Opiates      Cocaine      Methamphetamines      LSD      Ecstasy      Benzodiazepines      Caffeine      Inhalants      Others:                         Medical Consequences of Substance Abuse:  Legal Consequences of Substance Abuse:  Family Consequences of Substance Abuse:  Blackouts:  No DT's:  No Withdrawal Symptoms:  None  Social History: Current Place of Residence:   Place of Birth:   Family Members: Sister, mother, 3 children Marital Status:  Single Children:3  Relationships: "I have a boyfriend" Education:  Currently in school for GED prep. Educational Problems/Performance: Unable to complete HS in her teens.  History of  Abuse (Emotional/Phsycial/Sexual): Denies report of. Occupational Experiences: Unemployed  Legal History: None reported.  Family History:   Family History  Problem Relation Age of Onset  . Anesthesia problems Neg Hx   . Hypotension Neg Hx   .  Malignant hyperthermia Neg Hx   . Pseudochol deficiency Neg Hx     Mental Status Examination/Evaluation: Objective:  Appearance: Disheveled  Eye Contact::  Fair  Speech:  Clear and Coherent  Volume:  Normal  Mood:  Teary, "I don't feel like myself"  Affect:  Flat  Thought Process:  Logical  Orientation:  Full  Thought Content:  Denies AVH  Suicidal Thoughts:  Yes.  without intent/plan  Homicidal Thoughts:  No  Judgement:  Impaired  Insight:  Fair  Psychomotor Activity:  Normal  Akathisia:  No  Handed:  Right    Assets:  Desire for Improvement Vocational/Educational    Laboratory/X-Ray Psychological Evaluation(s)      Assessment:    AXIS I Major Depression, Recurrent severe, with psychotic features  AXIS II Deferred  AXIS III Past Medical History  Diagnosis Date  . Depression   . GERD (gastroesophageal reflux disease)   . Migraine   . Gout     ble     AXIS IV economic problems and educational problems  AXIS V 11-20 some danger of hurting self or others possible OR occasionally fails to maintain minimal personal hygiene OR gross impairment in communication   Treatment Plan/Recommendations: Order Vit. D levels,  Treatment Plan Summary: Daily contact with patient to assess and evaluate symptoms and progress in treatment Medication management  Observation Level/Precautions:  Q 15 minutes checks for safety.  Laboratory:  Ed lab reports and findings reviewed and noted.  Psychotherapy:  Group  Medications:  See lista  Routine PRN Medications:  Yes  Consultations:  None  Discharge Concerns:  "Will I feel like myself again?"  Other:      Armandina Stammer I 12/20/20122:30 PM

## 2011-03-03 NOTE — Progress Notes (Signed)
[facility] Group Notes: (Counselor/Nursing/MHT/Case Management/Adjunct)   Type of Therapy:  Group Therapy  Participation Level:  Limited  Participation Quality:  Attentive  Affect:  Blunted  Cognitive:  Appropriate  Insight:  None  Engagement in Group: Minimal   Engagement in Therapy:  None  Modes of Intervention:  Support and Exploration  Summary of Progress/Problems: Gina Shelton participated in activity sheet about balance, but did not participate in processing her experiences and examples. She Left group a few minutes before it ended, shouting about not being seen quickly enough and wanting to leave the hospital.   [name] 03/03/2011  5:26 PM

## 2011-03-03 NOTE — Progress Notes (Signed)
Suicide Risk Assessment  Admission Assessment     Demographic factors:  Assessment Details Time of Assessment: Admission Current Mental Status:  Current Mental Status:  (dnies si) Loss Factors:  Loss Factors: Financial problems / change in socioeconomic status Historical Factors:  Historical Factors: Prior suicide attempts;Family history of suicide;Family history of mental illness or substance abuse Risk Reduction Factors:  Risk Reduction Factors: Responsible for children under 70 years of age;Sense of responsibility to family;Living with another person, especially a relative  CLINICAL FACTORS:   Severe Anxiety and/or Agitation Depression:   Comorbid alcohol abuse/dependence Hopelessness Alcohol/Substance Abuse/Dependencies Previous Psychiatric Diagnoses and Treatments Medical Diagnoses and Treatments/Surgeries  COGNITIVE FEATURES THAT CONTRIBUTE TO RISK:  No Cognitive risk factors noted.   SUICIDE RISK:   Moderate:  Frequent suicidal ideation with limited intensity, and duration, some specificity in terms of plans, no associated intent, good self-control, limited dysphoria/symptomatology, some risk factors present, and identifiable protective factors, including available and accessible social support.  Patient notes some auditory hallucinations mood congruent, but denies suicidal or homicidal ideation, visual hallucinations, illusions, or delusions. Patient engages with good eye contact, is able to focus adequately in a one to one setting, and has clear goal directed thoughts. Patient speaks with a natural conversational volume, rate, and tone. Anxiety was reported at 4 on a scale of 1 the least and 10 the most. Depression was reported at 7 or 8 on the same scale. Patient is oriented times 4, recent and remote memory intact. Judgement: fairly intact Insight: fairly accurate Plan:  We will admit the patient for crisis stabilization and treatment. I talked to pt about starting  Thorazine for anxiety and Tegretol to prevent seizures and restart Zoloft that had been effective in the past. I explained the risks and benefits of medication in detail.  We will continue on q. 15 checks the unit protocol. At this time there is no clinical indication for one-to-one observation as patient contract for safety and presents little risk to harm themself and others.  We will increase collateral information. I encourage patient to participate in group milieu therapy. Pt will be seen in treatment team meeting tomorrow morning for further treatment and appropriate discharge planning. Please see history and physical note for more detailed information ELOS: 3 to 5 days.   Gina Shelton 03/03/2011, 3:57 PM

## 2011-03-03 NOTE — Progress Notes (Signed)
[facility] Group Notes:  (Counselor/Nursing/MHT/Case Management/Adjunct)    Type of Therapy:  Group Therapy  Participation Level:  Did Not Attend    Alexander, Regina Taree Hayes 03/03/2011  5:25 PM   

## 2011-03-04 MED ORDER — CHLORPROMAZINE HCL 25 MG PO TABS: 25.0000 mg | ORAL_TABLET | Freq: Three times a day (TID) | ORAL | Status: DC

## 2011-03-04 MED ORDER — OMEPRAZOLE 40 MG PO CPDR: 40.0000 mg | DELAYED_RELEASE_CAPSULE | Freq: Every day | ORAL | Status: DC

## 2011-03-04 MED ORDER — CETIRIZINE HCL 10 MG PO TABS: 10.0000 mg | ORAL_TABLET | Freq: Every day | ORAL | Status: DC

## 2011-03-04 MED ORDER — ALBUTEROL SULFATE HFA 108 (90 BASE) MCG/ACT IN AERS: 2.0000 | INHALATION_SPRAY | RESPIRATORY_TRACT | Status: DC | PRN

## 2011-03-04 MED ORDER — SERTRALINE HCL 100 MG PO TABS: 100.0000 mg | ORAL_TABLET | Freq: Every day | ORAL | Status: DC

## 2011-03-04 MED ORDER — TRAZODONE HCL 50 MG PO TABS: 50.0000 mg | ORAL_TABLET | Freq: Every evening | ORAL | Status: DC | PRN

## 2011-03-04 MED ORDER — SUMATRIPTAN SUCCINATE 25 MG PO TABS: 25.0000 mg | ORAL_TABLET | ORAL | Status: DC | PRN

## 2011-03-04 MED ORDER — NICOTINE 21 MG/24HR TD PT24: MEDICATED_PATCH | TRANSDERMAL | Status: DC

## 2011-03-04 NOTE — Progress Notes (Signed)
Patient ID: Gina Shelton, female   DOB: August 26, 1975, 35 y.o.   MRN: [id number] Pt is awake and active on the unit this AM. Pt denies SI/HI and A/V hallucinations. Pt affect is bright in the AM but she did c/o anxiety and requested prn medication which improved her mood. Pt is attending groups and is cooperative with staff. She states that she is ready to d/c home today. Writer will continue to monitor.

## 2011-03-04 NOTE — Progress Notes (Signed)
Suicide Risk Assessment  Discharge Assessment     Demographic factors:  Assessment Details Time of Assessment: Admission Current Mental Status:  Current Mental Status:  (dnies si) Risk Reduction Factors:  Risk Reduction Factors: Responsible for children under 33 years of age;Sense of responsibility to family;Living with another person, especially a relative  CLINICAL FACTORS:   Severe Anxiety and/or Agitation Depression:   Hopelessness Previous Psychiatric Diagnoses and Treatments Medical Diagnoses and Treatments/Surgeries  COGNITIVE FEATURES THAT CONTRIBUTE TO RISK:  No Cognitive risk factors noted.   SUICIDE RISK:   Minimal: No identifiable suicidal ideation.  Patients presenting with no risk factors but with morbid ruminations; may be classified as minimal risk based on the severity of the depressive symptoms  Patient denies suicidal or homicidal ideation, hallucinations, illusions, or delusions. Patient engages with good eye contact, is able to focus adequately in a one to one setting, and has clear goal directed thoughts. Patient speaks with a natural conversational volume, rate, and tone. Anxiety was reported at 1 on a scale of 1 the least and 10 the most. Depression was reported at 1 on the same scale. Patient is oriented times 4, recent and remote memory intact. Judgement: Improved from admission Insight: intact Plan: Remain on . chlorproMAZINE  25 mg Oral Once  . chlorproMAZINE  25 mg Oral TID  . nicotine  21 mg Transdermal Q0600  . sertraline  100 mg Oral Daily  Because Throazine helps anxiety, Nicotine patch stops the cravings for cigarettes, and Zoloft helps mood. Pt voices understanding of the risks and benefits of medication. Follow-up is with Follow-up Information    Follow up with [facility]. [facility]. (Your appointment with Dr. Barrie Folk is scheduled for Friday, March 18, 2011 at 9 a.m.)    Contact information:   [address] [place], [place]   [address]  [phone]      Follow up with [facility].   Contact information:   [address] [place], [place]  [address]  [phone]          Orson Aloe 03/04/2011, 12:15 PM

## 2011-03-04 NOTE — Progress Notes (Signed)
[facility] Adult Inpatient Family/Significant Other Suicide Prevention Education  Suicide Prevention Education:  Education Completed;  Chip Boer, mother, has been identified by the patient as the family member/significant other with whom the patient will be residing, and identified as the person(s) who will aid the patient in the event of a mental health crisis (suicidal ideations/suicide attempt).  With written consent from the patient, the family member/significant other has been provided the following suicide prevention education, prior to the and/or following the discharge of the patient.  The suicide prevention education provided includes the following:  Suicide risk factors  Suicide prevention and interventions  National Suicide Hotline telephone number  [facility] assessment telephone number  [facility] Emergency Assistance 911  [facility] and/or Residential Mobile Crisis Unit telephone number  Request made of family/significant other to:  Remove weapons (e.g., guns, rifles, knives), all items previously/currently identified as safety concern.    Remove drugs/medications (over-the-counter, prescriptions, illicit drugs), all items previously/currently identified as a safety concern.  Chip Boer reported that she believe Addreinne has made great progress while in the hospital. She had no safety concerns, and emphasized her support for Addreinne attending AA, counseling, and support groups.Chip Boer verbalizes understanding of the suicide prevention education information provided and has no questions.  She agrees to remove the items of safety concern listed above.  [name] 03/04/2011, 2:15 PM

## 2011-03-04 NOTE — Discharge Summary (Signed)
Discharge Note  Patient:  Gina Shelton is an 35 y.o., female DOB:  08-05-1975  Date of Admission:  03/02/2011  Date of Discharge:  03/04/2011  Level of Care:  OP  Discharge destination:  HOME  Is patient on multiple antipsychotic therapies at discharge:  NO  Patient phone:  [phone] (home) Patient address:   [address] [place] [address]  The patient received suicide prevention pamphlet:  YES Belongings returned:  Valuables  Dan Humphreys, Raschelle Wisenbaker 03/04/2011,2:13 PM

## 2011-03-04 NOTE — Progress Notes (Signed)
Patient seen during discharge planning group.  She reports being much improved and looking forward to discharging home today.  She denies SI/HI.  Patient rates depression at two and anxiety at three.  She advised of learning that she must stay on her medications.  She will be assisted with indigent medications.  Follow up scheduled with [facility].  Suicide prevention education reviewed.

## 2011-03-04 NOTE — Progress Notes (Signed)
[facility] Group Notes: (Counselor/Nursing/MHT/Case Management/Adjunct)   Type of Therapy:  Group Therapy  Participation Level:  Active  Participation Quality:  Attentive, Appropriate, Sharing  Affect:  Appropriate  Cognitive:  Appropriate  Insight:  Good  Engagement in Group: Good  Engagement in Therapy:  Good  Modes of Intervention:  Support and Exploration  Summary of Progress/Problems: Acelyn explored her personal risk factors as well as other risk factors for suicide. She processed why these are considered risks, and was supportive to others in finding protective factors against these risks. Seven talked about her first suicide attempt, and stated that she cannot imagine attempting again because of how sick it made her. However, she did recognize that for some people, once suicide has been an option it can remain one. She stated that suicide has crossed her mind when she has a problem several times since the attempt, but that she is able to remember the last attempt and convince herself not to choose that option.   [name] 03/04/2011  3:44 PM

## 2011-03-04 NOTE — Tx Team (Signed)
Interdisciplinary Treatment Plan Update (Adult)  Date:  03/04/2011  Time Reviewed:  8:52 AM   Progress in Treatment: Attending groups: Yes Participating in groups:  Yes Taking medication as prescribed:  Yes Tolerating medication: Yes Family/Significant othe contact made:  Contact to be made with  Patient understands diagnosis: Yes Discussing patient identified problems/goals with staff:  Yes Medical problems stabilized or resolved: Yes Denies suicidal/homicidal ideation: Yes Issues/concerns per patient self-inventory:  No  Other:  New problem(s) identified: None  Reason for Continuation of Hospitalization: Anxiety Depression Medication stabilization  Interventions implemented related to continuation of hospitalization:  Medication stabilization, safety checks q 15 mins, group attendance  Additional comments:  Estimated length of stay:  2-3 days  Discharge Plan: Discharge home, follow up with   New goal(s):  Review of initial/current patient goals per problem list:   1.  Goal(s): Decrease depressive symptoms  Met:  No  Target date: by discharge  As evidenced by: Mkayla rates depression at 2 today ( was at 10 at admission)  2.  Goal (s): Decrease anxiety symptoms  Met:  No  Target date: by discharge  As evidenced by:  Valerie rates anxiety at  3 today (was at 10 at admission)  3.  Goal(s):  Reduce risk of self-harm  Met:  Yes  Target date: by discharge  As evidenced by: Yolette reports no suicidal thoughts today  Attendees: Patient:  Gina Shelton 03/04/2011 8:52 AM  Family:   03/04/2011 8:52 AM  Physician:  Dr Orson Aloe, MD 03/04/2011 8:52 AM  Nursing:    03/04/2011 8:52 AM  Case Manager:  Juline Patch, LCSW 03/04/2011 8:52 AM  Counselor:  Angus Palms, LCSW 03/04/2011 8:52 AM  Other:  Reyes Ivan, LCSWA 03/04/2011 8:52 AM  Other:   03/04/2011 8:52 AM  Other:   03/04/2011 8:52 AM  Other:   03/04/2011 8:52 AM   Scribe for  Treatment Team:   [name], 03/04/2011 8:52 AM

## 2011-03-04 NOTE — Progress Notes (Signed)
Patient ID: Gina Shelton, female   DOB: 1975/07/10, 35 y.o.   MRN: [id number]  Patient stated that she was anxious earlier today r/t being seen by the MD late in the day. Stated that the received thorazine was very helpful. By this evening patient was polite and appropriate. No signs of anxiety noted. Denies SI/HI/AV. Requested trazodone for sleep, stated that it was very helpful the night before. Took HS medications without incident.

## 2011-03-04 NOTE — Discharge Summary (Signed)
Patient ID: Gina Shelton MRN: [id number] DOB/AGE: Mar 10, 1976 35 y.o.  Admit date: 03/02/2011 Discharge date: 03/04/2011  Reason for Admission: Patient is a 35 year old African-American female admitted from [facility] to [facility] with complaints of suicidal ideation. Patient reports "I have depression and anxiety. I have been depressed x 3 years and it is getting worse for the past one year. I stopped all my medications x 1 year ago because I lost my doctor, Dr. Lafonda Mosses. I like her a lot because she listens to me .   Hospital Course: Gina Shelton was admitted to [facility] under the care of Dr. Dan Humphreys. While under the care of Dr. Dan Humphreys, patient received medication management and monitoring of symptoms during daily rounds and 1:1 visits with the doctor. She participated in group counseling and activities. Gina Shelton depression was monitored closely and found to be improving as evidenced by her daily report and documentation of reduction of suicidal ideation and depression/anxiety symptoms. She is discharged to her home. She is to be followed on an out-patient care basis. Out-patient care appointments date, time and address provided for patient.  Discharge Diagnoses: Major Depressive disorder, recurrent,                                         Generalized Anxiety disorder.  Condition on  Discharge: Patient denies suicidal or homicidal ideation, hallucinations, illusions, or delusions. Patient engages with good eye contact, is able to focus adequately in a one to one setting, and has clear goal directed thoughts. Patient speaks with a natural conversational volume, rate, and tone. Anxiety was reported at 1 on a scale of 1 the least and 10 the most. Depression was reported at 1 on the same scale. Patient is oriented times 4, recent and remote memory intact. Judgement: Improved from admission Insight: intact Plan:  Current Discharge Medication List    START taking these medications   Details    chlorproMAZINE (THORAZINE) 25 MG tablet Take 1 tablet (25 mg total) by mouth 3 (three) times daily. Qty: 30 tablet, Refills: 0    nicotine (NICODERM CQ - DOSED IN MG/24 HOURS) 21 mg/24hr patch Apply to skin fresh one each morning for 19 more days then switch to 14 mg patch for 2 weeks, then reduce to 7 mg patch for the final 2 weeks, the STAY off nicotine Qty: 19 patch, Refills: 0    !! sertraline (ZOLOFT) 100 MG tablet Take 1 tablet (100 mg total) by mouth daily. Qty: 30 tablet, Refills: 0    traZODone (DESYREL) 50 MG tablet Take 1 tablet (50 mg total) by mouth at bedtime as needed for sleep (insomnia). Qty: 30 tablet, Refills: 0     !! - Potential duplicate medications found. Please discuss with provider.    CONTINUE these medications which have CHANGED   Details  albuterol (PROVENTIL HFA) 108 (90 BASE) MCG/ACT inhaler Inhale 2 puffs into the lungs every 4 (four) hours as needed for wheezing. Every 4-6 hours Qty: 1 Inhaler, Refills: 0    cetirizine (ZYRTEC) 10 MG tablet Take 1 tablet (10 mg total) by mouth daily. Qty: 30 tablet, Refills: 0    omeprazole (PRILOSEC) 40 MG capsule Take 1 capsule (40 mg total) by mouth daily. Qty: 30 capsule, Refills: 0    SUMAtriptan (IMITREX) 25 MG tablet Take 1 tablet (25 mg total) by mouth every 2 (two) hours as  needed for migraine. Onset of migraine Qty: 10 tablet, Refills: 0      CONTINUE these medications which have NOT CHANGED   Details  naproxen sodium (ANAPROX) 550 MG tablet Take 550 mg by mouth 2 (two) times daily. For severe migraine      !! sertraline (ZOLOFT) 50 MG tablet Take 50 mg by mouth 2 (two) times daily.       !! - Potential duplicate medications found. Please discuss with provider.    STOP taking these medications     Venlafaxine HCl 225 MG TB24        Follow-up Information    Follow up with Dr. Bar[facility] - [facility]. (Your appointment with Dr. Barrie Folk is scheduled for Friday, March 18, 2011 at 9 a.m.)     Contact information:   [address] [place], [place]  [address]  [phone]      Follow up with [facility].   Contact information:   [address] [place], [place]  [address]  [phone]       Signed: Orson Aloe 03/04/2011, 2:13 PM

## 2011-03-04 NOTE — Progress Notes (Signed)
[facility] Adult Inpatient Family/Significant Other Suicide Prevention Education  Suicide Prevention Education:  Contact Attempts: Gina Shelton, mother has been identified by the patient as the family member/significant other with whom the patient will be residing, and identified as the person(s) who will aid the patient in the event of a mental health crisis.  With written consent from the patient, two attempts were made to provide suicide prevention education, prior to and/or following the patient's discharge.  We were unsuccessful in providing suicide prevention education.  A suicide education pamphlet was given to the patient to share with family/significant other.  Date and time of first attempt: 03/04/2011@ 943    Gina Shelton 03/04/2011, 9:47 AM

## 2011-03-04 NOTE — Progress Notes (Signed)
Patient ID: Gina Shelton, female   DOB: 10/26/1975, 35 y.o.   MRN: [id number] Writer reviewed all d/c instructions with pt including medications, follow up care and crisis intervention. Pt verbally acknowledged understanding. Pt denies SI/HI and A/V hallucinations, and states that she is prepared to d/c today. Pt belongings returned from locker and pt is released into her own care.

## 2011-03-07 NOTE — Progress Notes (Signed)
Patient Discharge Instructions:  Admission Note Faxed,  12/24 Discharge Note Faxed,   12/24 After Visit Summary Faxed,  12/24 Faxed to the Next Level Care provider:  [facility] and Dr. Laney Potash, Barbaraann Share, 03/07/2011, 1:38 PM

## 2011-03-10 NOTE — Progress Notes (Signed)
Patient Discharge Instructions:  Admission Note Faxed,  03/10/2011 After Visit Summary Faxed,  03/10/2011 Faxed to the Next Level Care provider:  03/10/2011 D/C Summary faxed 03/10/2011 Facesheet faxed 03/10/2011  Re-faxed to [facility] @ [phone]  Wandra Scot, 03/10/2011, 2:50 PM

## 2011-03-22 ENCOUNTER — Emergency Department (HOSPITAL_COMMUNITY)
Admission: EM | Admit: 2011-03-22 | Discharge: 2011-03-22 | Discharge status: Against Medical Advice | Payer: Medicaid Other | Attending: Emergency Medicine | Admitting: Emergency Medicine

## 2011-03-22 DIAGNOSIS — Z0389 Encounter for observation for other suspected diseases and conditions ruled out: Secondary | ICD-10-CM | POA: Insufficient documentation

## 2011-03-26 NOTE — Discharge Summary (Deleted)
Patient ID: Gina Shelton MRN: [id number] DOB/AGE: Dec 13, 1975 36 y.o.  Admit date: 03/02/2011 Discharge date: 03/04/2011  Reason for Admission: Patient is a 36 year old African-American female admitted from [facility] to [facility] with complaints of suicidal ideation. Patient reports "I have depression and anxiety. I have been depressed x 3 years and it is getting worse for the past one year. I stopped all my medications x 1 year ago because I lost my doctor. My doctor is Ms. Lafonda Mosses. I like her a lot. She knew me and listens to me. When she left the clinic where I used to go, I lost the desire to be on my medicines.  Hospital Course:  Pt was admitted and placed on Thorazine for her anxiety,  She had significant improvement noted with that.  She was ready to leave after getting such complete relief from the switch to Thorazine. She was switched from Effexor back to Zoloft also with a rapid response.   Discharge Diagnoses:  AXIS I Depression, Recurrent, Severe, Generalized Anxiety Disorder  AXIS II Deferred  AXIS III Past Medical History  Diagnosis Date  . Depression   . GERD (gastroesophageal reflux disease)   . Migraine   . Gout     ble    AXIS IV Moderate  AXIS V 55   Condition on  Discharge: Patient denies suicidal or homicidal ideation, hallucinations, illusions, or delusions. Patient engages with good eye contact, is able to focus adequately in a one to one setting, and has clear goal directed thoughts. Patient speaks with a natural conversational volume, rate, and tone. Anxiety was reported at 1 on a scale of 1 the least and 10 the most. Depression was reported at 1 on the same scale. Patient is oriented times 4, recent and remote memory intact. Judgement: Improved from admission Insight: intact  Plan:  Discharge Medication List as of 03/07/2011 11:03 AM    START taking these medications   Details  chlorproMAZINE (THORAZINE) 25 MG tablet Take 1 tablet (25 mg total)  by mouth 3 (three) times daily., Starting 03/04/2011, Until Sun 04/03/11, Print    nicotine (NICODERM CQ - DOSED IN MG/24 HOURS) 21 mg/24hr patch Apply to skin fresh one each morning for 19 more days then switch to 14 mg patch for 2 weeks, then reduce to 7 mg patch for the final 2 weeks, the STAY off nicotine, Print    !! sertraline (ZOLOFT) 100 MG tablet Take 1 tablet (100 mg total) by mouth daily., Starting 03/04/2011, Until Sat 03/03/12, Print    traZODone (DESYREL) 50 MG tablet Take 1 tablet (50 mg total) by mouth at bedtime as needed for sleep (insomnia)., Starting 03/04/2011, Until Sun 04/03/11, Print     !! - Potential duplicate medications found. Please discuss with provider.    CONTINUE these medications which have CHANGED   Details  albuterol (PROVENTIL HFA) 108 (90 BASE) MCG/ACT inhaler Inhale 2 puffs into the lungs every 4 (four) hours as needed for wheezing. Every 4-6 hours, Starting 03/04/2011, Until Discontinued, Print    cetirizine (ZYRTEC) 10 MG tablet Take 1 tablet (10 mg total) by mouth daily., Starting 03/04/2011, Until Discontinued, Print    omeprazole (PRILOSEC) 40 MG capsule Take 1 capsule (40 mg total) by mouth daily., Starting 03/04/2011, Until Discontinued, Print    SUMAtriptan (IMITREX) 25 MG tablet Take 1 tablet (25 mg total) by mouth every 2 (two) hours as needed for migraine. Onset of migraine, Starting 03/04/2011, Until Discontinued, Print  CONTINUE these medications which have NOT CHANGED   Details  naproxen sodium (ANAPROX) 550 MG tablet Take 550 mg by mouth 2 (two) times daily. For severe migraine  , Until Discontinued, Historical Med    !! sertraline (ZOLOFT) 50 MG tablet Take 50 mg by mouth 2 (two) times daily.  , Until Discontinued, Historical Med     !! - Potential duplicate medications found. Please discuss with provider.    STOP taking these medications     Venlafaxine HCl 225 MG TB24        Follow-up Information    Follow up with Dr.  Barrie Folk - [facility]. (Your appointment with Dr. Barrie Folk is scheduled for Friday, March 18, 2011 at 9 a.m.)    Contact information:   [address] [place], [place]  [address]  [phone]      Follow up with Dr. York Cerise on 03/10/2011. (Your appointment with Dr. Georjean Mode is Thursday March 10, 2011 at 9:30.  Please arrive 30 minutes early or you will miss your appointment)    Contact information:   [address] [place], [place]  [address]  [phone]        Signed: Orson Aloe 03/26/2011, 10:08 AM

## 2011-04-12 ENCOUNTER — Encounter (HOSPITAL_COMMUNITY): Payer: Self-pay | Admitting: *Deleted

## 2011-04-12 ENCOUNTER — Emergency Department (HOSPITAL_COMMUNITY)
Admission: EM | Admit: 2011-04-12 | Discharge: 2011-04-13 | Disposition: A | Discharge status: Home / Self Care | Payer: Medicaid Other | Attending: Emergency Medicine | Admitting: Emergency Medicine

## 2011-04-12 DIAGNOSIS — R109 Unspecified abdominal pain: Secondary | ICD-10-CM | POA: Insufficient documentation

## 2011-04-12 DIAGNOSIS — D259 Leiomyoma of uterus, unspecified: Secondary | ICD-10-CM | POA: Insufficient documentation

## 2011-04-12 DIAGNOSIS — R5381 Other malaise: Secondary | ICD-10-CM | POA: Insufficient documentation

## 2011-04-12 DIAGNOSIS — F329 Major depressive disorder, single episode, unspecified: Secondary | ICD-10-CM | POA: Insufficient documentation

## 2011-04-12 DIAGNOSIS — K219 Gastro-esophageal reflux disease without esophagitis: Secondary | ICD-10-CM | POA: Insufficient documentation

## 2011-04-12 DIAGNOSIS — N898 Other specified noninflammatory disorders of vagina: Secondary | ICD-10-CM | POA: Insufficient documentation

## 2011-04-12 DIAGNOSIS — F3289 Other specified depressive episodes: Secondary | ICD-10-CM | POA: Insufficient documentation

## 2011-04-12 DIAGNOSIS — N939 Abnormal uterine and vaginal bleeding, unspecified: Secondary | ICD-10-CM

## 2011-04-12 DIAGNOSIS — D219 Benign neoplasm of connective and other soft tissue, unspecified: Secondary | ICD-10-CM

## 2011-04-12 LAB — URINALYSIS, ROUTINE W REFLEX MICROSCOPIC
Bilirubin Urine: NEGATIVE
Glucose, UA: NEGATIVE mg/dL
Ketones, ur: NEGATIVE mg/dL
Nitrite: NEGATIVE
Protein, ur: NEGATIVE mg/dL
Specific Gravity, Urine: 1.023 (ref 1.005–1.030)
Urobilinogen, UA: 1 mg/dL (ref 0.0–1.0)
pH: 6.5 (ref 5.0–8.0)

## 2011-04-12 LAB — CBC
HCT: 34.5 % — ABNORMAL LOW (ref 36.0–46.0)
HCT: 34.1 % — ABNORMAL LOW (ref 36.0–46.0)
Hemoglobin: 11.8 g/dL — ABNORMAL LOW (ref 12.0–15.0)
Hemoglobin: 11.6 g/dL — ABNORMAL LOW (ref 12.0–15.0)
MCH: 31.8 pg (ref 26.0–34.0)
MCHC: 34.2 g/dL (ref 30.0–36.0)
MCV: 93 fL (ref 78.0–100.0)
MCV: 93.2 fL (ref 78.0–100.0)
Platelets: 277 10*3/uL (ref 150–400)
RBC: 3.71 MIL/uL — ABNORMAL LOW (ref 3.87–5.11)
RBC: 3.66 MIL/uL — ABNORMAL LOW (ref 3.87–5.11)
RDW: 13.8 % (ref 11.5–15.5)
WBC: 8.9 10*3/uL (ref 4.0–10.5)
WBC: 8.9 10*3/uL (ref 4.0–10.5)

## 2011-04-12 LAB — WET PREP, GENITAL
Clue Cells Wet Prep HPF POC: NONE SEEN
Trich, Wet Prep: NONE SEEN
WBC, Wet Prep HPF POC: NONE SEEN
Yeast Wet Prep HPF POC: NONE SEEN

## 2011-04-12 LAB — URINE MICROSCOPIC-ADD ON

## 2011-04-12 LAB — POCT I-STAT, CHEM 8
BUN: 10 mg/dL (ref 6–23)
Calcium, Ion: 1.18 mmol/L (ref 1.12–1.32)
Chloride: 108 mEq/L (ref 96–112)
Creatinine, Ser: 0.8 mg/dL (ref 0.50–1.10)
Glucose, Bld: 107 mg/dL — ABNORMAL HIGH (ref 70–99)
HCT: 37 % (ref 36.0–46.0)
Hemoglobin: 12.6 g/dL (ref 12.0–15.0)
Potassium: 3.7 mEq/L (ref 3.5–5.1)
Sodium: 140 mEq/L (ref 135–145)
TCO2: 25 mmol/L (ref 0–100)

## 2011-04-12 LAB — POCT PREGNANCY, URINE: Preg Test, Ur: NEGATIVE

## 2011-04-12 MED ORDER — IBUPROFEN 800 MG PO TABS
800.0000 mg | ORAL_TABLET | Freq: Once | ORAL | Status: AC
  Administered 2011-04-12: 800 mg via ORAL
  Filled 2011-04-12: qty 1

## 2011-04-12 MED ORDER — IBUPROFEN 800 MG PO TABS: 800.0000 mg | ORAL_TABLET | Freq: Three times a day (TID) | ORAL | Status: AC

## 2011-04-12 MED ORDER — OXYCODONE-ACETAMINOPHEN 5-325 MG PO TABS: 2.0000 | ORAL_TABLET | ORAL | Status: DC | PRN

## 2011-04-12 MED ORDER — OXYCODONE-ACETAMINOPHEN 5-325 MG PO TABS
1.0000 | ORAL_TABLET | Freq: Once | ORAL | Status: AC
  Administered 2011-04-12: 1 via ORAL
  Filled 2011-04-12: qty 1

## 2011-04-12 NOTE — ED Provider Notes (Signed)
History     CSN: [id number]  Arrival date & time 04/12/11  1910   First MD Initiated Contact with Patient 04/12/11 2149      Chief Complaint  Patient presents with  . Vaginal Bleeding    (Consider location/radiation/quality/duration/timing/severity/associated sxs/prior treatment) Patient is a 36 y.o. female presenting with vaginal bleeding. The history is provided by the patient. No language interpreter was used.  Vaginal Bleeding This is a recurrent problem. The current episode started yesterday. The problem occurs constantly. The problem has been gradually worsening. Associated symptoms include abdominal pain, fatigue and weakness. Pertinent negatives include no chills, fever, urinary symptoms or vomiting. The symptoms are aggravated by exertion. She has tried nothing for the symptoms.    Past Medical History  Diagnosis Date  . Depression   . GERD (gastroesophageal reflux disease)   . Migraine   . Gout     ble    Past Surgical History  Procedure Date  . No past surgeries     Family History  Problem Relation Age of Onset  . Anesthesia problems Neg Hx   . Hypotension Neg Hx   . Malignant hyperthermia Neg Hx   . Pseudochol deficiency Neg Hx     History  Substance Use Topics  . Smoking status: Current Everyday Smoker -- 1.0 packs/day for 20 years    Types: Cigarettes  . Smokeless tobacco: Not on file  . Alcohol Use: 9.6 oz/week    16 Cans of beer per week     drinks 2 22 oz 2-3 times per week    OB History    Grav Para Term Preterm Abortions TAB SAB Ect Mult Living                  Review of Systems  Constitutional: Positive for fatigue. Negative for fever and chills.  Gastrointestinal: Positive for abdominal pain. Negative for vomiting.  Genitourinary: Positive for vaginal bleeding.  Neurological: Positive for weakness.  All other systems reviewed and are negative.    Allergies  Review of patient's allergies indicates no known allergies.  Home  Medications   Current Outpatient Rx  Name Route Sig Dispense Refill  . ALBUTEROL SULFATE HFA 108 (90 BASE) MCG/ACT IN AERS Inhalation Inhale 2 puffs into the lungs every 4 (four) hours as needed. For wheezing.     Marland Kitchen CETIRIZINE HCL 10 MG PO TABS Oral Take 10 mg by mouth daily.      Marland Kitchen NAPROXEN SODIUM 550 MG PO TABS Oral Take 550 mg by mouth 2 (two) times daily. For severe migraine      . NICOTINE 21 MG/24HR TD PT24 Transdermal Place 1 patch onto the skin daily. Apply to skin fresh one each morning for 19 more days then switch to 14 mg patch for 2 weeks, then reduce to 7 mg patch for the final 2 weeks, the STAY off nicotine     . OMEPRAZOLE 40 MG PO CPDR Oral Take 40 mg by mouth daily.      . SERTRALINE HCL 100 MG PO TABS Oral Take 100 mg by mouth daily.      . SERTRALINE HCL 50 MG PO TABS Oral Take 50 mg by mouth 2 (two) times daily.      . SUMATRIPTAN SUCCINATE 25 MG PO TABS Oral Take 25 mg by mouth every 2 (two) hours as needed. Onset of migraine     . TRAZODONE HCL 50 MG PO TABS Oral Take 50 mg by mouth at bedtime.  BP 120/82  Pulse 69  Temp(Src) 98.5 F (36.9 C) (Oral)  Resp 18  SpO2 97%  LMP 04/12/2011  Physical Exam  Nursing note and vitals reviewed. Constitutional: She is oriented to person, place, and time. She appears well-developed and well-nourished.  HENT:  Head: Normocephalic and atraumatic.  Eyes: Conjunctivae and EOM are normal. Pupils are equal, round, and reactive to light.  Neck: Normal range of motion. Neck supple.  Cardiovascular: Normal rate, regular rhythm, normal heart sounds and intact distal pulses.  Exam reveals no gallop and no friction rub.   No murmur heard. Pulmonary/Chest: Effort normal and breath sounds normal.  Abdominal: Soft. Bowel sounds are normal.  Genitourinary: Uterus normal. Cervix exhibits no motion tenderness, no discharge and no friability. Right adnexum displays no tenderness. Left adnexum displays no tenderness. There is bleeding  around the vagina. No vaginal discharge found.  Musculoskeletal: Normal range of motion. She exhibits no edema and no tenderness.  Neurological: She is alert and oriented to person, place, and time. She has normal reflexes.  Skin: Skin is warm and dry.  Psychiatric: She has a normal mood and affect.    ED Course  Procedures (including critical care time)  Labs Reviewed  CBC - Abnormal; Notable for the following:    RBC 3.71 (*)    Hemoglobin 11.8 (*)    HCT 34.5 (*)    All other components within normal limits  URINALYSIS, ROUTINE W REFLEX MICROSCOPIC - Abnormal; Notable for the following:    APPearance CLOUDY (*)    Hgb urine dipstick LARGE (*)    Leukocytes, UA TRACE (*)    All other components within normal limits  URINE MICROSCOPIC-ADD ON - Abnormal; Notable for the following:    Squamous Epithelial / LPF FEW (*)    All other components within normal limits  POCT PREGNANCY, URINE   No results found.   No diagnosis found.    MDM  Vaginal bleeding excessive with stable hgb.  Known history of fibroids with ? Procedure to shrink them. Period 2 days early this time.  Will follow up with gyn this week for possible intervention.  No dizziness.  Labs Reviewed  CBC - Abnormal; Notable for the following:    RBC 3.71 (*)    Hemoglobin 11.8 (*)    HCT 34.5 (*)    All other components within normal limits  URINALYSIS, ROUTINE W REFLEX MICROSCOPIC - Abnormal; Notable for the following:    APPearance CLOUDY (*)    Hgb urine dipstick LARGE (*)    Leukocytes, UA TRACE (*)    All other components within normal limits  URINE MICROSCOPIC-ADD ON - Abnormal; Notable for the following:    Squamous Epithelial / LPF FEW (*)    All other components within normal limits  CBC - Abnormal; Notable for the following:    RBC 3.66 (*)    Hemoglobin 11.6 (*)    HCT 34.1 (*)    All other components within normal limits  POCT I-STAT, CHEM 8 - Abnormal; Notable for the following:    Glucose,  Bld 107 (*)    All other components within normal limits  POCT PREGNANCY, URINE  WET PREP, GENITAL  GC/CHLAMYDIA PROBE AMP, GENITAL        Jethro Bastos, NP 04/13/11 1225  Jethro Bastos, NP 04/13/11 1225

## 2011-04-12 NOTE — ED Notes (Signed)
Provider to bedside for pelvic.

## 2011-04-12 NOTE — ED Notes (Signed)
The pt is on her period and she has had abd pain and heavy vaginal bleeding.  This is the second day of her period.  lmp jan 1st

## 2011-04-14 LAB — GC/CHLAMYDIA PROBE AMP, GENITAL: Chlamydia, DNA Probe: NEGATIVE

## 2011-04-19 ENCOUNTER — Encounter (HOSPITAL_COMMUNITY): Payer: Self-pay | Admitting: *Deleted

## 2011-04-19 ENCOUNTER — Inpatient Hospital Stay (HOSPITAL_COMMUNITY)
Admission: AD | Admit: 2011-04-19 | Discharge: 2011-04-19 | Disposition: A | Discharge status: Home / Self Care | Payer: Medicaid Other | Source: Ambulatory Visit | Attending: Obstetrics & Gynecology | Admitting: Obstetrics & Gynecology

## 2011-04-19 ENCOUNTER — Inpatient Hospital Stay (HOSPITAL_COMMUNITY): Payer: Medicaid Other

## 2011-04-19 DIAGNOSIS — K5900 Constipation, unspecified: Secondary | ICD-10-CM | POA: Insufficient documentation

## 2011-04-19 DIAGNOSIS — N938 Other specified abnormal uterine and vaginal bleeding: Secondary | ICD-10-CM | POA: Insufficient documentation

## 2011-04-19 DIAGNOSIS — R109 Unspecified abdominal pain: Secondary | ICD-10-CM | POA: Insufficient documentation

## 2011-04-19 DIAGNOSIS — N949 Unspecified condition associated with female genital organs and menstrual cycle: Secondary | ICD-10-CM | POA: Insufficient documentation

## 2011-04-19 LAB — URINALYSIS, ROUTINE W REFLEX MICROSCOPIC
Glucose, UA: NEGATIVE mg/dL
Ketones, ur: NEGATIVE mg/dL
Leukocytes, UA: NEGATIVE
Protein, ur: NEGATIVE mg/dL
Urobilinogen, UA: 0.2 mg/dL (ref 0.0–1.0)

## 2011-04-19 LAB — CBC
Hemoglobin: 11.2 g/dL — ABNORMAL LOW (ref 12.0–15.0)
Platelets: 247 10*3/uL (ref 150–400)
RBC: 3.61 MIL/uL — ABNORMAL LOW (ref 3.87–5.11)
WBC: 7.8 10*3/uL (ref 4.0–10.5)

## 2011-04-19 LAB — WET PREP, GENITAL: Yeast Wet Prep HPF POC: NONE SEEN

## 2011-04-19 MED ORDER — PROMETHAZINE HCL 25 MG PO TABS
25.0000 mg | ORAL_TABLET | Freq: Once | ORAL | Status: AC
  Administered 2011-04-19: 25 mg via ORAL
  Filled 2011-04-19: qty 1

## 2011-04-19 NOTE — ED Provider Notes (Signed)
History     Chief Complaint  Patient presents with  . Vaginal Bleeding  . Constipation  . Abdominal Pain   HPI Gina Shelton is 35 y.o. G3P3 with abdominal pain in lower abdomen and back, this occurs with urination and defecation.  Last bowel movement was 4 days ago.  Has been on Percocet from [facility] from evaluation of heavy bleeding and pain 04/12/11.  Has used glyceriin supp with only a little success.  Hx of constipation.   Continues with bleeding twice a day which is less than when she was seen in [facility].  Hx of fibroids--6 years ago.  Was treated with medications that "shrunk" the fibroid.    Is in the process of changing doctors to [employer] [facility].  Reports nausea and would like something for it.  She is not driving.  OB History    Grav Para Term Preterm Abortions TAB SAB Ect Mult Living   3 3        3       Past Medical History  Diagnosis Date  . Depression   . GERD (gastroesophageal reflux disease)   . Migraine   . Gout     ble    Past Surgical History  Procedure Date  . Cesarean section infection at incission requiring return to OR x 2  . Tubal ligation     Family History  Problem Relation Age of Onset  . Anesthesia problems Neg Hx   . Hypotension Neg Hx   . Malignant hyperthermia Neg Hx   . Pseudochol deficiency Neg Hx   . Hypertension Mother   . Arthritis Sister     History  Substance Use Topics  . Smoking status: Current Everyday Smoker -- 0.2 packs/day for 20 years    Types: Cigarettes  . Smokeless tobacco: Never Used   Comment: Currently using Nicotine patch  . Alcohol Use: 9.6 oz/week    16 Cans of beer per week     drinks 2 22 oz 2-3 times per week    Allergies: No Known Allergies  Prescriptions prior to admission  Medication Sig Dispense Refill  . albuterol (PROVENTIL HFA;VENTOLIN HFA) 108 (90 BASE) MCG/ACT inhaler Inhale 2 puffs into the lungs every 4 (four) hours as needed. For wheezing.       . cetirizine (ZYRTEC) 10 MG tablet Take 10 mg  by mouth daily.        Marland Kitchen ibuprofen (ADVIL,MOTRIN) 800 MG tablet Take 1 tablet (800 mg total) by mouth 3 (three) times daily.  21 tablet  0  . naproxen sodium (ANAPROX) 550 MG tablet Take 550 mg by mouth 2 (two) times daily. For severe migraine        . nicotine (NICODERM CQ - DOSED IN MG/24 HOURS) 21 mg/24hr patch Place 1 patch onto the skin daily. Apply to skin fresh one each morning for 19 more days then switch to 14 mg patch for 2 weeks, then reduce to 7 mg patch for the final 2 weeks, the STAY off nicotine       . omeprazole (PRILOSEC) 40 MG capsule Take 40 mg by mouth daily.        Marland Kitchen oxyCODONE-acetaminophen (PERCOCET) 5-325 MG per tablet Take 2 tablets by mouth every 4 (four) hours as needed for pain.  6 tablet  0  . sertraline (ZOLOFT) 100 MG tablet Take 100 mg by mouth daily.        . SUMAtriptan (IMITREX) 25 MG tablet Take 25 mg by mouth every  2 (two) hours as needed. Onset of migraine       . traZODone (DESYREL) 50 MG tablet Take 50 mg by mouth at bedtime.      . sertraline (ZOLOFT) 50 MG tablet Take 50 mg by mouth 2 (two) times daily.          Review of Systems  Constitutional: Negative.   Gastrointestinal: Positive for nausea and abdominal pain. Negative for vomiting.  Genitourinary:       + for abnormal bleeding  Musculoskeletal: Positive for back pain.   Physical Exam   Blood pressure 124/72, pulse 84, temperature 98.8 F (37.1 C), temperature source Oral, resp. rate 16, height 5' 3.5" (1.613 m), weight 76.828 kg (169 lb 6 oz), last menstrual period 04/12/2011.  Physical Exam  Constitutional: She is oriented to person, place, and time. She appears well-developed and well-nourished. No distress.  HENT:  Head: Normocephalic.  Neck: Normal range of motion.  Cardiovascular: Normal rate.   Respiratory: Effort normal.  GI: Soft. She exhibits no distension and no mass. There is no tenderness. There is no rebound and no guarding.  Genitourinary: Rectal exam shows no tenderness.  Uterus is enlarged. Uterus is not tender. Cervix exhibits no discharge. Right adnexum displays no tenderness and no fullness. Left adnexum displays no tenderness and no fullness. No tenderness or bleeding around the vagina. Vaginal discharge (small amount of white discharge) found.       Neg for stool in rectum  Neurological: She is alert and oriented to person, place, and time.  Skin: Skin is warm and dry.  Psychiatric: She has a normal mood and affect. Her behavior is normal.   Results for orders placed during the hospital encounter of 04/19/11 (from the past 24 hour(s))  URINALYSIS, ROUTINE W REFLEX MICROSCOPIC     Status: Normal   Collection Time   04/19/11  1:25 PM      Component Value Range   Color, Urine YELLOW  YELLOW    APPearance CLEAR  CLEAR    Specific Gravity, Urine 1.010  1.005 - 1.030    pH 6.5  5.0 - 8.0    Glucose, UA NEGATIVE  NEGATIVE (mg/dL)   Hgb urine dipstick NEGATIVE  NEGATIVE    Bilirubin Urine NEGATIVE  NEGATIVE    Ketones, ur NEGATIVE  NEGATIVE (mg/dL)   Protein, ur NEGATIVE  NEGATIVE (mg/dL)   Urobilinogen, UA 0.2  0.0 - 1.0 (mg/dL)   Nitrite NEGATIVE  NEGATIVE    Leukocytes, UA NEGATIVE  NEGATIVE   POCT PREGNANCY, URINE     Status: Normal   Collection Time   04/19/11  1:28 PM      Component Value Range   Preg Test, Ur NEGATIVE  NEGATIVE   WET PREP, GENITAL     Status: Abnormal   Collection Time   04/19/11  2:11 PM      Component Value Range   Yeast Wet Prep HPF POC NONE SEEN  NONE SEEN    Trich, Wet Prep NONE SEEN  NONE SEEN    Clue Cells Wet Prep HPF POC NONE SEEN  NONE SEEN    WBC, Wet Prep HPF POC FEW (*) NONE SEEN   CBC     Status: Abnormal   Collection Time   04/19/11  3:20 PM      Component Value Range   WBC 7.8  4.0 - 10.5 (K/uL)   RBC 3.61 (*) 3.87 - 5.11 (MIL/uL)   Hemoglobin 11.2 (*) 12.0 - 15.0 (g/dL)  HCT 34.2 (*) 36.0 - 46.0 (%)   MCV 94.7  78.0 - 100.0 (fL)   MCH 31.0  26.0 - 34.0 (pg)   MCHC 32.7  30.0 - 36.0 (g/dL)   RDW 78.2  95.6  - 21.3 (%)   Platelets 247  150 - 400 (K/uL)    Clinical Data: Abdominal plain. Vaginal bleeding. History of  fibroids  TRANSABDOMINAL AND TRANSVAGINAL ULTRASOUND OF PELVIS  Technique: Both transabdominal and transvaginal ultrasound  examinations of the pelvis were performed. Transabdominal technique  was performed for global imaging of the pelvis including uterus,  ovaries, adnexal regions, and pelvic cul-de-sac.  Comparison: 2008  It was necessary to proceed with endovaginal exam following the  transabdominal exam to visualize the myometrium, endometrium and  adnexa.  Findings:  Uterus: The uterus is retroverted and demonstrates a sagittal  length of 7.7 cm, depth of 5.1 cm and a transverse width of 5.9 cm.  No definite mural fibroids are seen.  Endometrium: Appears trilayered with an AP width of 9 mm. A small  amount of simple fluid is noted within the endometrial canal.  Right ovary: Has a normal appearance measuring 3.6 x 2.5 by 1.7 cm  Left ovary: Has a normal appearance measuring 4.3 x 2.6 by 2.2 cm  Other findings: No pelvic fluid or separate adnexal masses are  seen.  IMPRESSION:  Normal early proliferative phase pelvic ultrasound. No definite  fibroids seen.  Original Report Authenticated By: Bertha Stakes, M.D.     MAU Course  Procedures GC/CHL culture to lab  MDM Phenergan 25mg  po ordered for nausea  Assessment and Plan  A:  Dysfunctional vaginal bleeding     Normal pelvic ultrasound     Constipation  P:  May use advil or tylenol for pain      D/C percocet     Colace tab po bid until stool is soft.  For chronic constipation may use Miralax.    Followup doctor of your choice if bleeding continues.  Charika Mikelson,EVE M 04/19/2011, 1:54 PM   Matt Holmes, NP 04/19/11 1545  Matt Holmes, NP 04/19/11 1548

## 2011-04-19 NOTE — Progress Notes (Signed)
Pt states, " I went to [facility] ED on 1/29 because I was bleeding real heavy and passing large clots. This was the worst I had ever bleed. Now I have gush of blood maybe twice a day. I am having sharp in my lower abdomen and low back since last week. The percocet helps the pain but now I haven't had a BM in four days. I've used glycerin suppositories but it didn't help."

## 2011-04-19 NOTE — Progress Notes (Signed)
Pt also states she had had pain ibn low abdomen when she urinates since Saturday

## 2011-04-19 NOTE — ED Provider Notes (Signed)
History     Chief Complaint  Patient presents with  . Vaginal Bleeding  . Constipation  . Abdominal Pain   HPI      Past Medical History  Diagnosis Date  . Depression   . GERD (gastroesophageal reflux disease)   . Migraine   . Gout     ble    Past Surgical History  Procedure Date  . No past surgeries     Family History  Problem Relation Age of Onset  . Anesthesia problems Neg Hx   . Hypotension Neg Hx   . Malignant hyperthermia Neg Hx   . Pseudochol deficiency Neg Hx     History  Substance Use Topics  . Smoking status: Current Everyday Smoker -- 1.0 packs/day for 20 years    Types: Cigarettes  . Smokeless tobacco: Not on file  . Alcohol Use: 9.6 oz/week    16 Cans of beer per week     drinks 2 22 oz 2-3 times per week    Allergies: No Known Allergies  Prescriptions prior to admission  Medication Sig Dispense Refill  . albuterol (PROVENTIL HFA;VENTOLIN HFA) 108 (90 BASE) MCG/ACT inhaler Inhale 2 puffs into the lungs every 4 (four) hours as needed. For wheezing.       . cetirizine (ZYRTEC) 10 MG tablet Take 10 mg by mouth daily.        Marland Kitchen ibuprofen (ADVIL,MOTRIN) 800 MG tablet Take 1 tablet (800 mg total) by mouth 3 (three) times daily.  21 tablet  0  . naproxen sodium (ANAPROX) 550 MG tablet Take 550 mg by mouth 2 (two) times daily. For severe migraine        . nicotine (NICODERM CQ - DOSED IN MG/24 HOURS) 21 mg/24hr patch Place 1 patch onto the skin daily. Apply to skin fresh one each morning for 19 more days then switch to 14 mg patch for 2 weeks, then reduce to 7 mg patch for the final 2 weeks, the STAY off nicotine       . omeprazole (PRILOSEC) 40 MG capsule Take 40 mg by mouth daily.        Marland Kitchen oxyCODONE-acetaminophen (PERCOCET) 5-325 MG per tablet Take 2 tablets by mouth every 4 (four) hours as needed for pain.  6 tablet  0  . sertraline (ZOLOFT) 100 MG tablet Take 100 mg by mouth daily.        . sertraline (ZOLOFT) 50 MG tablet Take 50 mg by mouth 2 (two)  times daily.        . SUMAtriptan (IMITREX) 25 MG tablet Take 25 mg by mouth every 2 (two) hours as needed. Onset of migraine       . traZODone (DESYREL) 50 MG tablet Take 50 mg by mouth at bedtime.        ROS Physical Exam   Blood pressure 124/72, pulse 84, temperature 98.8 F (37.1 C), temperature source Oral, resp. rate 16, height 5' 3.5" (1.613 m), weight 76.828 kg (169 lb 6 oz), last menstrual period 04/12/2011.  Physical Exam  MAU Course  Procedures  MDM  Assessment and Plan    Duplicate note opened.  One is completed.   Melonie Florida, NP 04/20/11 1501

## 2011-04-20 LAB — GC/CHLAMYDIA PROBE AMP, GENITAL: GC Probe Amp, Genital: NEGATIVE

## 2011-04-20 NOTE — ED Provider Notes (Signed)
Attestation of Attending Supervision of Advanced Practitioner: Evaluation and management procedures were performed by the PA/NP/CNM/OB Fellow under my supervision/collaboration. Chart reviewed, and agree with management and plan.  UGONNA ANYANWU, M.D. 04/20/2011 9:42 AM   

## 2011-04-20 NOTE — ED Provider Notes (Signed)
Attestation of Attending Supervision of Advanced Practitioner: Evaluation and management procedures were performed by the PA/NP/CNM/OB Fellow under my supervision/collaboration. Chart reviewed, and agree with management and plan.  Jaynie Collins, M.D. 04/20/2011 3:51 PM

## 2011-04-22 NOTE — ED Provider Notes (Signed)
Medical screening examination/treatment/procedure(s) were performed by non-physician practitioner and as supervising physician I was immediately available for consultation/collaboration.  Tad Fancher, MD 04/22/11 0102 

## 2011-05-07 ENCOUNTER — Emergency Department (HOSPITAL_COMMUNITY)
Admission: EM | Admit: 2011-05-07 | Discharge: 2011-05-07 | Disposition: A | Discharge status: Home / Self Care | Payer: Medicaid Other | Attending: Emergency Medicine | Admitting: Emergency Medicine

## 2011-05-07 ENCOUNTER — Encounter (HOSPITAL_COMMUNITY): Payer: Self-pay | Admitting: Emergency Medicine

## 2011-05-07 DIAGNOSIS — X500XXA Overexertion from strenuous movement or load, initial encounter: Secondary | ICD-10-CM | POA: Insufficient documentation

## 2011-05-07 DIAGNOSIS — T148XXA Other injury of unspecified body region, initial encounter: Secondary | ICD-10-CM | POA: Insufficient documentation

## 2011-05-07 DIAGNOSIS — K219 Gastro-esophageal reflux disease without esophagitis: Secondary | ICD-10-CM | POA: Insufficient documentation

## 2011-05-07 DIAGNOSIS — Z79899 Other long term (current) drug therapy: Secondary | ICD-10-CM | POA: Insufficient documentation

## 2011-05-07 DIAGNOSIS — F329 Major depressive disorder, single episode, unspecified: Secondary | ICD-10-CM | POA: Insufficient documentation

## 2011-05-07 DIAGNOSIS — Y92009 Unspecified place in unspecified non-institutional (private) residence as the place of occurrence of the external cause: Secondary | ICD-10-CM | POA: Insufficient documentation

## 2011-05-07 DIAGNOSIS — F3289 Other specified depressive episodes: Secondary | ICD-10-CM | POA: Insufficient documentation

## 2011-05-07 HISTORY — DX: Anxiety disorder, unspecified: F41.9

## 2011-05-07 MED ORDER — OXYCODONE-ACETAMINOPHEN 5-325 MG PO TABS
2.0000 | ORAL_TABLET | Freq: Once | ORAL | Status: AC
  Administered 2011-05-07: 2 via ORAL
  Filled 2011-05-07: qty 2

## 2011-05-07 MED ORDER — IBUPROFEN 800 MG PO TABS: 800.0000 mg | ORAL_TABLET | Freq: Three times a day (TID) | ORAL | Status: AC | PRN

## 2011-05-07 MED ORDER — IBUPROFEN 800 MG PO TABS
800.0000 mg | ORAL_TABLET | Freq: Once | ORAL | Status: AC
  Administered 2011-05-07: 800 mg via ORAL
  Filled 2011-05-07: qty 1

## 2011-05-07 MED ORDER — TRAMADOL HCL 50 MG PO TABS: 50.0000 mg | ORAL_TABLET | Freq: Four times a day (QID) | ORAL | Status: AC | PRN

## 2011-05-07 NOTE — ED Notes (Signed)
Pt. Stated, My sister was trying to stretch out my lt. Leg and I think she pulled a muscle.

## 2011-05-07 NOTE — ED Provider Notes (Signed)
History     CSN: [id number]  Arrival date & time 05/07/11  1448   First MD Initiated Contact with Patient 05/07/11 1516      Chief Complaint  Patient presents with  . Leg Pain    (Consider location/radiation/quality/duration/timing/severity/associated sxs/prior treatment) Patient is a 36 y.o. female presenting with leg pain. The history is provided by the patient. No language interpreter was used.  Leg Pain  The incident occurred yesterday. The incident occurred at home. Injury mechanism: while stretching leg - overstretched. The pain is present in the left thigh (posterior ). The pain is moderate. The pain has been constant since onset. Pertinent negatives include no numbness, no inability to bear weight, no loss of motion, no muscle weakness, no loss of sensation and no tingling. The symptoms are aggravated by activity, bearing weight and palpation. She has tried NSAIDs for the symptoms. The treatment provided mild relief.    Past Medical History  Diagnosis Date  . Depression   . GERD (gastroesophageal reflux disease)   . Migraine   . Gout     ble  . Depression   . Migraine   . Anxiety     Past Surgical History  Procedure Date  . Cesarean section infection at incission requiring return to OR x 2  . Tubal ligation     Family History  Problem Relation Age of Onset  . Anesthesia problems Neg Hx   . Hypotension Neg Hx   . Malignant hyperthermia Neg Hx   . Pseudochol deficiency Neg Hx   . Hypertension Mother   . Arthritis Sister     History  Substance Use Topics  . Smoking status: Current Everyday Smoker -- 0.2 packs/day for 20 years    Types: Cigarettes  . Smokeless tobacco: Never Used   Comment: Currently using Nicotine patch  . Alcohol Use: 9.6 oz/week    16 Cans of beer per week     drinks 2 22 oz 2-3 times per week    OB History    Grav Para Term Preterm Abortions TAB SAB Ect Mult Living   3 3        3       Review of Systems  Constitutional:  Negative for fever, activity change, appetite change and fatigue.  HENT: Negative for congestion, sore throat, rhinorrhea, neck pain and neck stiffness.   Respiratory: Negative for cough and shortness of breath.   Cardiovascular: Negative for chest pain and palpitations.  Gastrointestinal: Negative for nausea, vomiting and abdominal pain.  Genitourinary: Negative for dysuria, urgency, frequency and flank pain.  Musculoskeletal: Negative for myalgias, back pain and arthralgias.  Neurological: Negative for dizziness, tingling, weakness, light-headedness, numbness and headaches.  All other systems reviewed and are negative.    Allergies  Review of patient's allergies indicates no known allergies.  Home Medications   Current Outpatient Rx  Name Route Sig Dispense Refill  . ALBUTEROL SULFATE HFA 108 (90 BASE) MCG/ACT IN AERS Inhalation Inhale 2 puffs into the lungs every 4 (four) hours as needed. For wheezing.     Marland Kitchen CETIRIZINE HCL 10 MG PO TABS Oral Take 10 mg by mouth daily.     Marland Kitchen NAPROXEN SODIUM 550 MG PO TABS Oral Take 550 mg by mouth 2 (two) times daily as needed. For severe migraine     . OMEPRAZOLE 40 MG PO CPDR Oral Take 40 mg by mouth daily.      . SERTRALINE HCL 100 MG PO TABS Oral Take 100 mg  by mouth daily.      . SUMATRIPTAN SUCCINATE 25 MG PO TABS Oral Take 25 mg by mouth every 2 (two) hours as needed. For migraine.    . TRAZODONE HCL 50 MG PO TABS Oral Take 50 mg by mouth at bedtime.    . IBUPROFEN 800 MG PO TABS Oral Take 1 tablet (800 mg total) by mouth every 8 (eight) hours as needed for pain. 30 tablet 0  . TRAMADOL HCL 50 MG PO TABS Oral Take 1 tablet (50 mg total) by mouth every 6 (six) hours as needed for pain. 15 tablet 0    BP 129/75  Pulse 106  Temp(Src) 98.2 F (36.8 C) (Oral)  Resp 16  SpO2 97%  LMP 04/12/2011  Physical Exam  Nursing note and vitals reviewed. Constitutional: She is oriented to person, place, and time. She appears well-developed and  well-nourished. No distress.  HENT:  Head: Normocephalic and atraumatic.  Mouth/Throat: Oropharynx is clear and moist.  Eyes: Conjunctivae and EOM are normal. Pupils are equal, round, and reactive to light.  Neck: Normal range of motion. Neck supple.  Cardiovascular: Normal rate, regular rhythm, normal heart sounds and intact distal pulses.  Exam reveals no gallop and no friction rub.   No murmur heard. Pulmonary/Chest: Effort normal and breath sounds normal. No respiratory distress.  Abdominal: Soft. Bowel sounds are normal. There is no tenderness.  Musculoskeletal: Normal range of motion. She exhibits tenderness (Tenderness along the course of the left hamstring muscles).       No concern for DVT  Neurological: She is alert and oriented to person, place, and time. No cranial nerve deficit.  Skin: Skin is warm and dry. No rash noted.    ED Course  Procedures (including critical care time)  Labs Reviewed - No data to display No results found.   1. Muscle strain       MDM  Muscle strain of the hamstring muscles on the left. Is able to and bili without difficulty. Will be given a prescription for ibuprofen and Ultram for pain.        Dayton Bailiff, MD 05/07/11 [phone]

## 2011-05-07 NOTE — Discharge Instructions (Signed)
Muscle Strain A muscle strain, or pulled muscle, occurs when a muscle is over-stretched. A small number of muscle fibers may also be torn. This is especially common in athletes. This happens when a sudden violent force placed on a muscle pushes it past its capacity. Usually, recovery from a pulled muscle takes 1 to 2 weeks. But complete healing will take 5 to 6 weeks. There are millions of muscle fibers. Following injury, your body will usually return to normal quickly. HOME CARE INSTRUCTIONS   While awake, apply ice to the sore muscle for 15 to 20 minutes each hour for the first 2 days. Put ice in a plastic bag and place a towel between the bag of ice and your skin.   Do not use the pulled muscle for several days. Do not use the muscle if you have pain.   You may wrap the injured area with an elastic bandage for comfort. Be careful not to bind it too tightly. This may interfere with blood circulation.   Only take over-the-counter or prescription medicines for pain, discomfort, or fever as directed by your caregiver. Do not use aspirin as this will increase bleeding (bruising) at injury site.   Warming up before exercise helps prevent muscle strains.  SEEK MEDICAL CARE IF:  There is increased pain or swelling in the affected area. MAKE SURE YOU:   Understand these instructions.   Will watch your condition.   Will get help right away if you are not doing well or get worse.  Document Released: 02/28/2005 Document Revised: 11/10/2010 Document Reviewed: 09/27/2006 [employer] Patient Information 2012 [employer]. 

## 2011-09-24 ENCOUNTER — Emergency Department (HOSPITAL_COMMUNITY)
Admission: EM | Admit: 2011-09-24 | Discharge: 2011-09-24 | Disposition: A | Discharge status: Home / Self Care | Payer: Medicaid Other | Attending: Emergency Medicine | Admitting: Emergency Medicine

## 2011-09-24 ENCOUNTER — Encounter (HOSPITAL_COMMUNITY): Payer: Self-pay | Admitting: Physical Medicine and Rehabilitation

## 2011-09-24 ENCOUNTER — Emergency Department (HOSPITAL_COMMUNITY): Payer: Medicaid Other

## 2011-09-24 DIAGNOSIS — F329 Major depressive disorder, single episode, unspecified: Secondary | ICD-10-CM | POA: Insufficient documentation

## 2011-09-24 DIAGNOSIS — K219 Gastro-esophageal reflux disease without esophagitis: Secondary | ICD-10-CM | POA: Insufficient documentation

## 2011-09-24 DIAGNOSIS — S0280XA Fracture of other specified skull and facial bones, unspecified side, initial encounter for closed fracture: Secondary | ICD-10-CM | POA: Insufficient documentation

## 2011-09-24 DIAGNOSIS — S022XXA Fracture of nasal bones, initial encounter for closed fracture: Secondary | ICD-10-CM

## 2011-09-24 DIAGNOSIS — F3289 Other specified depressive episodes: Secondary | ICD-10-CM | POA: Insufficient documentation

## 2011-09-24 DIAGNOSIS — S0285XA Fracture of orbit, unspecified, initial encounter for closed fracture: Secondary | ICD-10-CM

## 2011-09-24 MED ORDER — OXYCODONE-ACETAMINOPHEN 5-325 MG PO TABS: 1.0000 | ORAL_TABLET | Freq: Four times a day (QID) | ORAL | Status: AC | PRN

## 2011-09-24 MED ORDER — OXYCODONE-ACETAMINOPHEN 5-325 MG PO TABS
1.0000 | ORAL_TABLET | Freq: Once | ORAL | Status: AC
  Administered 2011-09-24: 1 via ORAL
  Filled 2011-09-24: qty 1
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
## 2011-09-24 NOTE — ED Notes (Signed)
R eye 20/50 on re-check
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
## 2012-02-15 ENCOUNTER — Emergency Department (HOSPITAL_COMMUNITY): Payer: Medicaid Other

## 2012-02-15 ENCOUNTER — Emergency Department (HOSPITAL_COMMUNITY)
Admission: EM | Admit: 2012-02-15 | Discharge: 2012-02-15 | Disposition: A | Discharge status: Home / Self Care | Payer: Medicaid Other | Attending: Emergency Medicine | Admitting: Emergency Medicine

## 2012-02-15 ENCOUNTER — Encounter (HOSPITAL_COMMUNITY): Payer: Self-pay | Admitting: Emergency Medicine

## 2012-02-15 DIAGNOSIS — S63509A Unspecified sprain of unspecified wrist, initial encounter: Secondary | ICD-10-CM | POA: Insufficient documentation

## 2012-02-15 DIAGNOSIS — M109 Gout, unspecified: Secondary | ICD-10-CM | POA: Insufficient documentation

## 2012-02-15 DIAGNOSIS — S63601A Unspecified sprain of right thumb, initial encounter: Secondary | ICD-10-CM

## 2012-02-15 DIAGNOSIS — Y939 Activity, unspecified: Secondary | ICD-10-CM | POA: Insufficient documentation

## 2012-02-15 DIAGNOSIS — Z791 Long term (current) use of non-steroidal anti-inflammatories (NSAID): Secondary | ICD-10-CM | POA: Insufficient documentation

## 2012-02-15 DIAGNOSIS — Y929 Unspecified place or not applicable: Secondary | ICD-10-CM | POA: Insufficient documentation

## 2012-02-15 DIAGNOSIS — M25531 Pain in right wrist: Secondary | ICD-10-CM

## 2012-02-15 DIAGNOSIS — S63501A Unspecified sprain of right wrist, initial encounter: Secondary | ICD-10-CM

## 2012-02-15 DIAGNOSIS — K219 Gastro-esophageal reflux disease without esophagitis: Secondary | ICD-10-CM | POA: Insufficient documentation

## 2012-02-15 DIAGNOSIS — F341 Dysthymic disorder: Secondary | ICD-10-CM | POA: Insufficient documentation

## 2012-02-15 DIAGNOSIS — W19XXXA Unspecified fall, initial encounter: Secondary | ICD-10-CM | POA: Insufficient documentation

## 2012-02-15 DIAGNOSIS — F17200 Nicotine dependence, unspecified, uncomplicated: Secondary | ICD-10-CM | POA: Insufficient documentation

## 2012-02-15 DIAGNOSIS — Z8669 Personal history of other diseases of the nervous system and sense organs: Secondary | ICD-10-CM | POA: Insufficient documentation

## 2012-02-15 DIAGNOSIS — S63659A Sprain of metacarpophalangeal joint of unspecified finger, initial encounter: Secondary | ICD-10-CM | POA: Insufficient documentation

## 2012-02-15 MED ORDER — OXYCODONE-ACETAMINOPHEN 5-325 MG PO TABS: 2.0000 | ORAL_TABLET | Freq: Three times a day (TID) | ORAL | Status: DC | PRN

## 2012-02-15 MED ORDER — OXYCODONE-ACETAMINOPHEN 5-325 MG PO TABS
2.0000 | ORAL_TABLET | Freq: Once | ORAL | Status: AC
  Administered 2012-02-15: 2 via ORAL
  Filled 2012-02-15: qty 2

## 2012-02-15 MED ORDER — IBUPROFEN 400 MG PO TABS
400.0000 mg | ORAL_TABLET | Freq: Once | ORAL | Status: AC
  Administered 2012-02-15: 400 mg via ORAL
  Filled 2012-02-15: qty 1

## 2012-02-15 NOTE — ED Notes (Signed)
Dr. Bednar at bedside. 

## 2012-02-15 NOTE — ED Notes (Signed)
Ortho paged. 

## 2012-02-15 NOTE — Progress Notes (Signed)
Orthopedic Tech Progress Note Patient Details:  Gina Shelton 1975/10/12 [id number]  Ortho Devices Type of Ortho Device: Thumb velcro splint Ortho Device/Splint Location: right hand Ortho Device/Splint Interventions: Application   Nikki Dom 02/15/2012, 3:48 PM

## 2012-02-15 NOTE — ED Notes (Signed)
Ortho tech at bedside 

## 2012-02-15 NOTE — Progress Notes (Signed)
Orthopedic Tech Progress Note Patient Details:  Gina Shelton Dec 16, 1975 [id number]  Ortho Devices Type of Ortho Device: Arm sling Ortho Device/Splint Location: (R) UE Ortho Device/Splint Interventions: Application   Jennye Moccasin 02/15/2012, 4:12 PM

## 2012-02-15 NOTE — ED Notes (Signed)
Pt ambulatory leaving ED with sling and splint applied by ortho tech; pt given d/c teaching and prescriptions. Pt has no further questions upon d/c.

## 2012-02-15 NOTE — ED Notes (Signed)
Pt denies chest pain, shortness of breath, nausea, and dizziness. Pt c/o pain upon movement of right wrist and hand; pt has normal pulses in affected extremity. Pt mentating appropriately.

## 2012-02-15 NOTE — ED Notes (Signed)
Pt c/o right arm pain after falling on arm x 2 days ago; pt sts having increased pain

## 2012-02-15 NOTE — ED Provider Notes (Signed)
History   This chart was scribed for Gina Horn, MD by Gerlean Ren, ED Scribe. This patient was seen in room TR09C/TR09C and the patient's care was started at 2:30 PM    CSN: [id number]  Arrival date & time 02/15/12  1247   First MD Initiated Contact with Patient 02/15/12 1429      Chief Complaint  Patient presents with  . Hand Pain     The history is provided by the patient. No language interpreter was used.   Gina Shelton is a 36 y.o. female with h/o gout and GERD who presents to the Emergency Department complaining of constant, moderate, gradually worsening, throbbing, non-radiating right wrist, hand, and forearm pain after falling 2 days ago with no reported head trauma, LOC, or further injuries as a result.  Pt denies weakness or numbness in right hand.  Pt is a current everyday smoker and reports alcohol use.   Past Medical History  Diagnosis Date  . Depression   . GERD (gastroesophageal reflux disease)   . Migraine   . Gout     ble  . Depression   . Migraine   . Anxiety     Past Surgical History  Procedure Date  . Cesarean section infection at incission requiring return to OR x 2  . Tubal ligation     Family History  Problem Relation Age of Onset  . Anesthesia problems Neg Hx   . Hypotension Neg Hx   . Malignant hyperthermia Neg Hx   . Pseudochol deficiency Neg Hx   . Hypertension Mother   . Arthritis Sister     History  Substance Use Topics  . Smoking status: Current Every Day Smoker -- 0.2 packs/day for 20 years    Types: Cigarettes  . Smokeless tobacco: Never Used     Comment: Currently using Nicotine patch  . Alcohol Use: 9.6 oz/week    16 Cans of beer per week     Comment: drinks 2 22 oz 2-3 times per week    OB History    Grav Para Term Preterm Abortions TAB SAB Ect Mult Living   3 3        3       Review of Systems 10 Systems reviewed and are negative for acute change except as noted in the HPI.  Allergies  Review of patient's  allergies indicates no known allergies.  Home Medications   Current Outpatient Rx  Name  Route  Sig  Dispense  Refill  . IBUPROFEN 200 MG PO TABS   Oral   Take 400 mg by mouth every 6 (six) hours as needed. For pain         . OXYCODONE-ACETAMINOPHEN 5-325 MG PO TABS   Oral   Take 2 tablets by mouth every 8 (eight) hours as needed for pain.   20 tablet   0     BP 111/71  Pulse 94  Temp 99.1 F (37.3 C) (Oral)  Resp 15  SpO2 100%  Physical Exam  Nursing note and vitals reviewed. Constitutional:       Awake, alert, nontoxic appearance.  HENT:  Head: Atraumatic.  Eyes: Right eye exhibits no discharge. Left eye exhibits no discharge.  Neck: Neck supple.  Pulmonary/Chest: Effort normal. She exhibits no tenderness.  Abdominal: Soft. There is no tenderness. There is no rebound.  Musculoskeletal: She exhibits no tenderness.       Baseline ROM, no obvious new focal weakness.  Cervical spine, back, left  arm, and bilateral legs all non-tender. No tenderness to right shoulder, upper arm, or elbow.  Diffusely tender right forearm, wrist, and thumb, non-tender index, long, ring, small fingers.  CR<2 seconds all digits with normal light touch, intact median, radial, ulnar nerve function right hand.  Diffusely tender right thumb, first metacarpal, anatomic snuff box, wrist, and forearm  Neurological:       Mental status and motor strength appears baseline for patient and situation.  Skin: No rash noted.  Psychiatric: She has a normal mood and affect.    ED Course  Procedures (including critical care time) DIAGNOSTIC STUDIES: Oxygen Saturation is 100% on room air, normal by my interpretation.    COORDINATION OF CARE: 2:34 PM- Patient informed of clinical course, understands medical decision-making process, and agrees with plan.  Ordered right hand XR, right wrist XR, and right forearm XR.  Labs Reviewed - No data to display Dg Forearm Right  02/15/2012  *RADIOLOGY REPORT*   Clinical Data: Larey Seat.  Right forearm pain.  RIGHT FOREARM - 2 VIEW  Comparison: None  Findings: The wrist and elbow joints are maintained.  No acute forearm fracture.  IMPRESSION: No acute bony findings.   Original Report Authenticated By: Rudie Meyer, M.D.    Dg Wrist Complete Right  02/15/2012  *RADIOLOGY REPORT*  Clinical Data: Larey Seat.  Right wrist pain.  RIGHT WRIST - COMPLETE 3+ VIEW  Comparison: None  Findings: The joint spaces are maintained.  No acute fracture.  IMPRESSION: No acute bony findings.   Original Report Authenticated By: Rudie Meyer, M.D.    Dg Hand Complete Right  02/15/2012  *RADIOLOGY REPORT*  Clinical Data: Larey Seat.  Injured right hand.  RIGHT HAND - COMPLETE 3+ VIEW  Comparison: None  Findings: The joint spaces are maintained.  No acute fracture.  IMPRESSION: No acute bony findings.   Original Report Authenticated By: Rudie Meyer, M.D.      1. Sprain of right wrist   2. Right wrist pain   3. Sprain of right thumb   4. Fall       MDM  Pt stable in ED with no significant deterioration in condition.  Patient / Family / Caregiver informed of clinical course, understand medical decision-making process, and agree with plan.  I doubt any other EMC precluding discharge at this time.  I personally performed the services described in this documentation, which was scribed in my presence. The recorded information has been reviewed and is accurate.       Gina Horn, MD 02/20/12 [id number]

## 2012-02-15 NOTE — ED Notes (Signed)
Pt requested sling for arm--Dr. Fonnie Jarvis informed and new order place. Ortho paged

## 2012-06-27 ENCOUNTER — Emergency Department (HOSPITAL_COMMUNITY)
Admission: EM | Admit: 2012-06-27 | Discharge: 2012-06-27 | Disposition: A | Discharge status: Home / Self Care | Payer: Medicaid Other | Attending: Emergency Medicine | Admitting: Emergency Medicine

## 2012-06-27 ENCOUNTER — Encounter (HOSPITAL_COMMUNITY): Payer: Self-pay | Admitting: Family Medicine

## 2012-06-27 DIAGNOSIS — Z862 Personal history of diseases of the blood and blood-forming organs and certain disorders involving the immune mechanism: Secondary | ICD-10-CM | POA: Insufficient documentation

## 2012-06-27 DIAGNOSIS — Z8679 Personal history of other diseases of the circulatory system: Secondary | ICD-10-CM | POA: Insufficient documentation

## 2012-06-27 DIAGNOSIS — Z79899 Other long term (current) drug therapy: Secondary | ICD-10-CM | POA: Insufficient documentation

## 2012-06-27 DIAGNOSIS — Z8639 Personal history of other endocrine, nutritional and metabolic disease: Secondary | ICD-10-CM | POA: Insufficient documentation

## 2012-06-27 DIAGNOSIS — Z8719 Personal history of other diseases of the digestive system: Secondary | ICD-10-CM | POA: Insufficient documentation

## 2012-06-27 DIAGNOSIS — F17200 Nicotine dependence, unspecified, uncomplicated: Secondary | ICD-10-CM | POA: Insufficient documentation

## 2012-06-27 DIAGNOSIS — M5430 Sciatica, unspecified side: Secondary | ICD-10-CM | POA: Insufficient documentation

## 2012-06-27 DIAGNOSIS — M5431 Sciatica, right side: Secondary | ICD-10-CM

## 2012-06-27 DIAGNOSIS — F411 Generalized anxiety disorder: Secondary | ICD-10-CM | POA: Insufficient documentation

## 2012-06-27 DIAGNOSIS — F3289 Other specified depressive episodes: Secondary | ICD-10-CM | POA: Insufficient documentation

## 2012-06-27 DIAGNOSIS — F329 Major depressive disorder, single episode, unspecified: Secondary | ICD-10-CM | POA: Insufficient documentation

## 2012-06-27 LAB — GLUCOSE, CAPILLARY: Glucose-Capillary: 94 mg/dL (ref 70–99)

## 2012-06-27 MED ORDER — PREDNISONE 10 MG PO TABS: 20.0000 mg | ORAL_TABLET | Freq: Every day | ORAL | Status: DC

## 2012-06-27 MED ORDER — HYDROCODONE-ACETAMINOPHEN 5-325 MG PO TABS: 1.0000 | ORAL_TABLET | Freq: Three times a day (TID) | ORAL | Status: DC | PRN

## 2012-06-27 NOTE — ED Provider Notes (Signed)
History     CSN: [id number]  Arrival date & time 06/27/12  1225   First MD Initiated Contact with Patient 06/27/12 1306      Chief Complaint  Patient presents with  . Leg Pain  . Arm Pain    (Consider location/radiation/quality/duration/timing/severity/associated sxs/prior treatment) HPI  Patient presents to the ED with complaints of right leg pain and right hand pain that started last night. She weight lifted the day before but does not remember injuring herself. The pain is so severe it woke her from her sleep last night. She took Tylenol and Trazadone but still had the pain when she woke up. She says it is a deep dull ache the starts at her hip and radiates all the way down behind her knee. Her hand pain is from her right wrist to the tip of her fingers. She denies having any weakness, numbness, tingling. No bowel or urinary incontinence. Denies IV drug use or hx of. nad vss  Past Medical History  Diagnosis Date  . Depression   . GERD (gastroesophageal reflux disease)   . Migraine   . Gout     ble  . Depression   . Migraine   . Anxiety     Past Surgical History  Procedure Laterality Date  . Cesarean section  infection at incission requiring return to OR x 2  . Tubal ligation      Family History  Problem Relation Age of Onset  . Anesthesia problems Neg Hx   . Hypotension Neg Hx   . Malignant hyperthermia Neg Hx   . Pseudochol deficiency Neg Hx   . Hypertension Mother   . Arthritis Sister     History  Substance Use Topics  . Smoking status: Current Every Day Smoker -- 0.25 packs/day for 20 years    Types: Cigarettes  . Smokeless tobacco: Never Used     Comment: Currently using Nicotine patch  . Alcohol Use: 9.6 oz/week    16 Cans of beer per week     Comment: drinks 2 22 oz 2-3 times per week    OB History   Grav Para Term Preterm Abortions TAB SAB Ect Mult Living   3 3        3       Review of Systems  Allergies  Review of patient's allergies  indicates no known allergies.  Home Medications   Current Outpatient Rx  Name  Route  Sig  Dispense  Refill  . sertraline (ZOLOFT) 100 MG tablet   Oral   Take 100 mg by mouth daily.         . traZODone (DESYREL) 100 MG tablet   Oral   Take 100 mg by mouth at bedtime and may repeat dose one time if needed.         Marland Kitchen HYDROcodone-acetaminophen (NORCO/VICODIN) 5-325 MG per tablet   Oral   Take 1 tablet by mouth every 8 (eight) hours as needed for pain.   8 tablet   0   . predniSONE (DELTASONE) 10 MG tablet   Oral   Take 2 tablets (20 mg total) by mouth daily.   21 tablet   0     6 tabs on day 1, 5 tabs on day 2, 4 tabs on day 3, ...     BP 124/83  Pulse 102  Temp(Src) 97.6 F (36.4 C) (Oral)  Resp 18  SpO2 99%  LMP 06/24/2012  Physical Exam  Nursing note and vitals reviewed.  Constitutional: She appears well-developed and well-nourished. No distress.  HENT:  Head: Normocephalic and atraumatic.  Eyes: Pupils are equal, round, and reactive to light.  Neck: Normal range of motion. Neck supple.  Cardiovascular: Normal rate and regular rhythm.   Pulmonary/Chest: Effort normal.  Abdominal: Soft.  Musculoskeletal:       Back:       Right hand: Normal. She exhibits normal range of motion, no tenderness, no bony tenderness, no laceration and no swelling. Normal sensation noted. Normal strength noted.   Equal strength to bilateral lower extremities. Neurosensory  function adequate to both legs. Skin color is normal. Skin is warm and moist. I see no step off deformity, no bony tenderness. Pt is able to ambulate without limp. Pain is relieved when sitting in certain positions. ROM is decreased due to pain. No crepitus, laceration, effusion, swelling.  Pulses are normal   Neurological: She is alert.  Skin: Skin is warm and dry.    ED Course  Procedures (including critical care time)  Labs Reviewed  GLUCOSE, CAPILLARY   No results found.   1. Sciatica neuralgia,  right       MDM  CBG = 94.  Rx - prednisone burst and Vicodin.   Patient has PCP and i have asked that she follow-up with them.  Patient with back pain. No neurological deficits. Patient is ambulatory. No warning symptoms of back pain including: loss of bowel or bladder control, night sweats, waking from sleep with back pain, unexplained fevers or weight loss, h/o cancer, IVDU, recent trauma. No concern for cauda equina, epidural abscess, or other serious cause of back pain. Conservative measures such as rest, ice/heat and pain medicine indicated with PCP follow-up if no improvement with conservative management.    Pt has been advised of the symptoms that warrant their return to the ED. Patient has voiced understanding and has agreed to follow-up with the PCP or specialist.         Dorthula Matas, PA-C 06/27/12 1417

## 2012-06-27 NOTE — Discharge Instructions (Signed)
Sciatica Sciatica is pain, weakness, numbness, or tingling along the path of the sciatic nerve. The nerve starts in the lower back and runs down the back of each leg. The nerve controls the muscles in the lower leg and in the back of the knee, while also providing sensation to the back of the thigh, lower leg, and the sole of your foot. Sciatica is a symptom of another medical condition. For instance, nerve damage or certain conditions, such as a herniated disk or bone spur on the spine, pinch or put pressure on the sciatic nerve. This causes the pain, weakness, or other sensations normally associated with sciatica. Generally, sciatica only affects one side of the body. CAUSES   Herniated or slipped disc.  Degenerative disk disease.  A pain disorder involving the narrow muscle in the buttocks (piriformis syndrome).  Pelvic injury or fracture.  Pregnancy.  Tumor (rare). SYMPTOMS  Symptoms can vary from mild to very severe. The symptoms usually travel from the low back to the buttocks and down the back of the leg. Symptoms can include:  Mild tingling or dull aches in the lower back, leg, or hip.  Numbness in the back of the calf or sole of the foot.  Burning sensations in the lower back, leg, or hip.  Sharp pains in the lower back, leg, or hip.  Leg weakness.  Severe back pain inhibiting movement. These symptoms may get worse with coughing, sneezing, laughing, or prolonged sitting or standing. Also, being overweight may worsen symptoms. DIAGNOSIS  Your caregiver will perform a physical exam to look for common symptoms of sciatica. He or she may ask you to do certain movements or activities that would trigger sciatic nerve pain. Other tests may be performed to find the cause of the sciatica. These may include:  Blood tests.  X-rays.  Imaging tests, such as an MRI or CT scan. TREATMENT  Treatment is directed at the cause of the sciatic pain. Sometimes, treatment is not necessary  and the pain and discomfort goes away on its own. If treatment is needed, your caregiver may suggest:  Over-the-counter medicines to relieve pain.  Prescription medicines, such as anti-inflammatory medicine, muscle relaxants, or narcotics.  Applying heat or ice to the painful area.  Steroid injections to lessen pain, irritation, and inflammation around the nerve.  Reducing activity during periods of pain.  Exercising and stretching to strengthen your abdomen and improve flexibility of your spine. Your caregiver may suggest losing weight if the extra weight makes the back pain worse.  Physical therapy.  Surgery to eliminate what is pressing or pinching the nerve, such as a bone spur or part of a herniated disk. HOME CARE INSTRUCTIONS   Only take over-the-counter or prescription medicines for pain or discomfort as directed by your caregiver.  Apply ice to the affected area for 20 minutes, 3 4 times a day for the first 48 72 hours. Then try heat in the same way.  Exercise, stretch, or perform your usual activities if these do not aggravate your pain.  Attend physical therapy sessions as directed by your caregiver.  Keep all follow-up appointments as directed by your caregiver.  Do not wear high heels or shoes that do not provide proper support.  Check your mattress to see if it is too soft. A firm mattress may lessen your pain and discomfort. SEEK IMMEDIATE MEDICAL CARE IF:   You lose control of your bowel or bladder (incontinence).  You have increasing weakness in the lower back,   pelvis, buttocks, or legs.  You have redness or swelling of your back.  You have a burning sensation when you urinate.  You have pain that gets worse when you lie down or awakens you at night.  Your pain is worse than you have experienced in the past.  Your pain is lasting longer than 4 weeks.  You are suddenly losing weight without reason. MAKE SURE YOU:  Understand these  instructions.  Will watch your condition.  Will get help right away if you are not doing well or get worse. Document Released: 02/22/2001 Document Revised: 08/30/2011 Document Reviewed: 07/10/2011 [employer] Patient Information 2013 [employer].  

## 2012-06-27 NOTE — ED Notes (Signed)
Pt complaining of right sided body pain since last night. sts took some extra strength tylenol and trazodone and woke up this am and wasn't better.

## 2012-06-27 NOTE — ED Notes (Signed)
Patient denies any injury to her right side. No swelling or puncture sites noted. Denies any N/V/D or fevers.

## 2012-06-29 NOTE — ED Provider Notes (Signed)
Medical screening examination/treatment/procedure(s) were performed by non-physician practitioner and as supervising physician I was immediately available for consultation/collaboration.  Lakenzie Mcclafferty T Yeudiel Mateo, MD 06/29/12 0824 

## 2012-08-04 ENCOUNTER — Emergency Department (HOSPITAL_COMMUNITY): Payer: Medicaid Other

## 2012-08-04 ENCOUNTER — Encounter (HOSPITAL_COMMUNITY): Payer: Self-pay | Admitting: *Deleted

## 2012-08-04 ENCOUNTER — Emergency Department (HOSPITAL_COMMUNITY)
Admission: EM | Admit: 2012-08-04 | Discharge: 2012-08-04 | Payer: Medicaid Other | Attending: Emergency Medicine | Admitting: Emergency Medicine

## 2012-08-04 DIAGNOSIS — F329 Major depressive disorder, single episode, unspecified: Secondary | ICD-10-CM | POA: Insufficient documentation

## 2012-08-04 DIAGNOSIS — Z8719 Personal history of other diseases of the digestive system: Secondary | ICD-10-CM | POA: Insufficient documentation

## 2012-08-04 DIAGNOSIS — F411 Generalized anxiety disorder: Secondary | ICD-10-CM | POA: Insufficient documentation

## 2012-08-04 DIAGNOSIS — S4980XA Other specified injuries of shoulder and upper arm, unspecified arm, initial encounter: Secondary | ICD-10-CM | POA: Insufficient documentation

## 2012-08-04 DIAGNOSIS — S161XXA Strain of muscle, fascia and tendon at neck level, initial encounter: Secondary | ICD-10-CM

## 2012-08-04 DIAGNOSIS — Y9289 Other specified places as the place of occurrence of the external cause: Secondary | ICD-10-CM | POA: Insufficient documentation

## 2012-08-04 DIAGNOSIS — S139XXA Sprain of joints and ligaments of unspecified parts of neck, initial encounter: Secondary | ICD-10-CM | POA: Insufficient documentation

## 2012-08-04 DIAGNOSIS — F17200 Nicotine dependence, unspecified, uncomplicated: Secondary | ICD-10-CM | POA: Insufficient documentation

## 2012-08-04 DIAGNOSIS — X58XXXA Exposure to other specified factors, initial encounter: Secondary | ICD-10-CM | POA: Insufficient documentation

## 2012-08-04 DIAGNOSIS — Z79899 Other long term (current) drug therapy: Secondary | ICD-10-CM | POA: Insufficient documentation

## 2012-08-04 DIAGNOSIS — S46909A Unspecified injury of unspecified muscle, fascia and tendon at shoulder and upper arm level, unspecified arm, initial encounter: Secondary | ICD-10-CM | POA: Insufficient documentation

## 2012-08-04 DIAGNOSIS — Y9389 Activity, other specified: Secondary | ICD-10-CM | POA: Insufficient documentation

## 2012-08-04 DIAGNOSIS — S0990XA Unspecified injury of head, initial encounter: Secondary | ICD-10-CM | POA: Insufficient documentation

## 2012-08-04 DIAGNOSIS — Z862 Personal history of diseases of the blood and blood-forming organs and certain disorders involving the immune mechanism: Secondary | ICD-10-CM | POA: Insufficient documentation

## 2012-08-04 DIAGNOSIS — F3289 Other specified depressive episodes: Secondary | ICD-10-CM | POA: Insufficient documentation

## 2012-08-04 DIAGNOSIS — Z8639 Personal history of other endocrine, nutritional and metabolic disease: Secondary | ICD-10-CM | POA: Insufficient documentation

## 2012-08-04 DIAGNOSIS — G43909 Migraine, unspecified, not intractable, without status migrainosus: Secondary | ICD-10-CM | POA: Insufficient documentation

## 2012-08-04 MED ORDER — IBUPROFEN 800 MG PO TABS
800.0000 mg | ORAL_TABLET | Freq: Once | ORAL | Status: DC
  Filled 2012-08-04: qty 1

## 2012-08-04 NOTE — ED Provider Notes (Signed)
History     CSN: [id number]  Arrival date & time 08/04/12  5621   First MD Initiated Contact with Patient 08/04/12 0550      Chief Complaint  Patient presents with  . Neck Pain  . Shoulder Pain    (Consider location/radiation/quality/duration/timing/severity/associated sxs/prior treatment) HPI Comments: Patient comes to the ER in custody of police. Patient was taken down by police during an arrest earlier today here she is complaining of head and neck pain after being restrained. Pain is moderate to severe. No numbness or tingling in the arms or legs. No weakness in extremities. No chest pain, abdominal pain, shortness of breath or back pain.  Patient is a 37 y.o. female presenting with neck pain and shoulder pain.  Neck Pain Associated symptoms: headaches   Shoulder Pain Associated symptoms include headaches.    Past Medical History  Diagnosis Date  . Depression   . GERD (gastroesophageal reflux disease)   . Migraine   . Gout     ble  . Depression   . Migraine   . Anxiety     Past Surgical History  Procedure Laterality Date  . Cesarean section  infection at incission requiring return to OR x 2  . Tubal ligation      Family History  Problem Relation Age of Onset  . Anesthesia problems Neg Hx   . Hypotension Neg Hx   . Malignant hyperthermia Neg Hx   . Pseudochol deficiency Neg Hx   . Hypertension Mother   . Arthritis Sister     History  Substance Use Topics  . Smoking status: Current Every Day Smoker -- 0.25 packs/day for 20 years    Types: Cigarettes  . Smokeless tobacco: Never Used     Comment: Currently using Nicotine patch  . Alcohol Use: 9.6 oz/week    16 Cans of beer per week     Comment: drinks 2 22 oz 2-3 times per week    OB History   Grav Para Term Preterm Abortions TAB SAB Ect Mult Living   3 3        3       Review of Systems  HENT: Positive for neck pain.   Neurological: Positive for headaches.  All other systems reviewed and are  negative.    Allergies  Review of patient's allergies indicates no known allergies.  Home Medications   Current Outpatient Rx  Name  Route  Sig  Dispense  Refill  . HYDROcodone-acetaminophen (NORCO/VICODIN) 5-325 MG per tablet   Oral   Take 1 tablet by mouth every 8 (eight) hours as needed for pain.   8 tablet   0   . predniSONE (DELTASONE) 10 MG tablet   Oral   Take 2 tablets (20 mg total) by mouth daily.   21 tablet   0     6 tabs on day 1, 5 tabs on day 2, 4 tabs on day 3, ...   . sertraline (ZOLOFT) 100 MG tablet   Oral   Take 100 mg by mouth daily.         . traZODone (DESYREL) 100 MG tablet   Oral   Take 100 mg by mouth at bedtime and may repeat dose one time if needed.           BP 142/97  Pulse 124  Temp(Src) 98.4 F (36.9 C) (Oral)  Resp 20  Ht 5\' 2"  (1.575 m)  Wt 172 lb (78.019 kg)  BMI 31.45 kg/m2  SpO2 97%  Physical Exam  Constitutional: She is oriented to person, place, and time. She appears well-developed and well-nourished. No distress.  HENT:  Head: Normocephalic and atraumatic.  Right Ear: Hearing normal.  Left Ear: Hearing normal.  Nose: Nose normal.  Mouth/Throat: Oropharynx is clear and moist and mucous membranes are normal.  Eyes: Conjunctivae and EOM are normal. Pupils are equal, round, and reactive to light.  Neck: Normal range of motion. Neck supple. Muscular tenderness present. No spinous process tenderness present.  Cardiovascular: Regular rhythm, S1 normal and S2 normal.  Exam reveals no gallop and no friction rub.   No murmur heard. Pulmonary/Chest: Effort normal and breath sounds normal. No respiratory distress. She exhibits no tenderness.  Abdominal: Soft. Normal appearance and bowel sounds are normal. There is no hepatosplenomegaly. There is no tenderness. There is no rebound, no guarding, no tenderness at McBurney's point and negative Murphy's sign. No hernia.  Musculoskeletal: Normal range of motion.  Neurological: She  is alert and oriented to person, place, and time. She has normal strength. No cranial nerve deficit or sensory deficit. Coordination normal. GCS eye subscore is 4. GCS verbal subscore is 5. GCS motor subscore is 6.  Skin: Skin is warm, dry and intact. No rash noted. No cyanosis.  Psychiatric: She has a normal mood and affect. Her speech is normal and behavior is normal. Thought content normal.    ED Course  Procedures (including critical care time)  Labs Reviewed - No data to display Ct Head Wo Contrast  08/04/2012   *RADIOLOGY REPORT*  Clinical Data:  Taken down during arrest; assess for head or cervical spine injury.  CT HEAD WITHOUT CONTRAST AND CT CERVICAL SPINE WITHOUT CONTRAST  Technique:  Multidetector CT imaging of the head and cervical spine was performed following the standard protocol without intravenous contrast.  Multiplanar CT image reconstructions of the cervical spine were also generated.  Comparison: CT of the head performed 09/24/2011, and radiograph of the soft tissues of the neck performed 07/17/2006  CT HEAD  Findings: There is no evidence of acute infarction, mass lesion, or intra- or extra-axial hemorrhage on CT.  The posterior fossa, including the cerebellum, brainstem and fourth ventricle, is within normal limits.  The third and lateral ventricles, and basal ganglia are unremarkable in appearance.  The cerebral hemispheres are symmetric in appearance, with normal gray- white differentiation.  No mass effect or midline shift is seen.  There is no evidence of fracture; visualized osseous structures are unremarkable in appearance.  The orbits are within normal limits. The paranasal sinuses and mastoid air cells are well-aerated.  No significant soft tissue abnormalities are seen.  IMPRESSION: No evidence of traumatic intracranial injury or fracture.  CT CERVICAL SPINE  Findings: There is no evidence of fracture or subluxation. Vertebral bodies demonstrate normal height and alignment.  Intervertebral disc spaces are preserved.  Prevertebral soft tissues are within normal limits.  The visualized neural foramina are grossly unremarkable.  Evaluation is suboptimal due to motion artifact.  The thyroid gland is unremarkable in appearance.  The visualized lung apices are clear.  No significant soft tissue abnormalities are seen.  IMPRESSION: No evidence of fracture or subluxation along the cervical spine; evaluation suboptimal due to motion artifact.   Original Report Authenticated By: Tonia Ghent, M.D.   Ct Cervical Spine Wo Contrast  08/04/2012   *RADIOLOGY REPORT*  Clinical Data:  Taken down during arrest; assess for head or cervical spine injury.  CT HEAD WITHOUT CONTRAST AND CT  CERVICAL SPINE WITHOUT CONTRAST  Technique:  Multidetector CT imaging of the head and cervical spine was performed following the standard protocol without intravenous contrast.  Multiplanar CT image reconstructions of the cervical spine were also generated.  Comparison: CT of the head performed 09/24/2011, and radiograph of the soft tissues of the neck performed 07/17/2006  CT HEAD  Findings: There is no evidence of acute infarction, mass lesion, or intra- or extra-axial hemorrhage on CT.  The posterior fossa, including the cerebellum, brainstem and fourth ventricle, is within normal limits.  The third and lateral ventricles, and basal ganglia are unremarkable in appearance.  The cerebral hemispheres are symmetric in appearance, with normal gray- white differentiation.  No mass effect or midline shift is seen.  There is no evidence of fracture; visualized osseous structures are unremarkable in appearance.  The orbits are within normal limits. The paranasal sinuses and mastoid air cells are well-aerated.  No significant soft tissue abnormalities are seen.  IMPRESSION: No evidence of traumatic intracranial injury or fracture.  CT CERVICAL SPINE  Findings: There is no evidence of fracture or subluxation. Vertebral bodies  demonstrate normal height and alignment. Intervertebral disc spaces are preserved.  Prevertebral soft tissues are within normal limits.  The visualized neural foramina are grossly unremarkable.  Evaluation is suboptimal due to motion artifact.  The thyroid gland is unremarkable in appearance.  The visualized lung apices are clear.  No significant soft tissue abnormalities are seen.  IMPRESSION: No evidence of fracture or subluxation along the cervical spine; evaluation suboptimal due to motion artifact.   Original Report Authenticated By: Tonia Ghent, M.D.     Diagnosis: Cervical strain    MDM  Patient complaining of neck pain after being subdued during an arrest. Patient's pain is is in the soft tissues on the right side of the neck. She did not have posterior midline tenderness. She was complaining of headache as well. CT scan of head and cervical spine were negative. Neurologic exam is unremarkable. Patient is okay for discharge into the custody of police officers.        Gilda Crease, MD 08/04/12 [phone]

## 2012-08-04 NOTE — ED Notes (Signed)
Pt placed in [place] j neck brace for support

## 2012-08-04 NOTE — ED Notes (Signed)
Pt is here with [employer] to have her neck , shoulder and arms,  Checked due to pt being taken down by officers when she attempted to take an officers gun,  Officers want her neck and head checked out because of numerous take downs

## 2012-08-04 NOTE — ED Notes (Signed)
Xray completed

## 2012-08-28 ENCOUNTER — Emergency Department (HOSPITAL_COMMUNITY)
Admission: EM | Admit: 2012-08-28 | Discharge: 2012-08-28 | Disposition: A | Discharge status: Home / Self Care | Payer: Medicaid Other | Attending: Emergency Medicine | Admitting: Emergency Medicine

## 2012-08-28 ENCOUNTER — Encounter (HOSPITAL_COMMUNITY): Payer: Self-pay | Admitting: Physical Medicine and Rehabilitation

## 2012-08-28 ENCOUNTER — Emergency Department (HOSPITAL_COMMUNITY): Payer: Medicaid Other

## 2012-08-28 DIAGNOSIS — Z8679 Personal history of other diseases of the circulatory system: Secondary | ICD-10-CM | POA: Insufficient documentation

## 2012-08-28 DIAGNOSIS — F17200 Nicotine dependence, unspecified, uncomplicated: Secondary | ICD-10-CM | POA: Insufficient documentation

## 2012-08-28 DIAGNOSIS — F3289 Other specified depressive episodes: Secondary | ICD-10-CM | POA: Insufficient documentation

## 2012-08-28 DIAGNOSIS — F329 Major depressive disorder, single episode, unspecified: Secondary | ICD-10-CM | POA: Insufficient documentation

## 2012-08-28 DIAGNOSIS — F411 Generalized anxiety disorder: Secondary | ICD-10-CM | POA: Insufficient documentation

## 2012-08-28 DIAGNOSIS — Z8719 Personal history of other diseases of the digestive system: Secondary | ICD-10-CM | POA: Insufficient documentation

## 2012-08-28 DIAGNOSIS — Z862 Personal history of diseases of the blood and blood-forming organs and certain disorders involving the immune mechanism: Secondary | ICD-10-CM | POA: Insufficient documentation

## 2012-08-28 DIAGNOSIS — Z79899 Other long term (current) drug therapy: Secondary | ICD-10-CM | POA: Insufficient documentation

## 2012-08-28 DIAGNOSIS — R0789 Other chest pain: Secondary | ICD-10-CM

## 2012-08-28 DIAGNOSIS — Z3202 Encounter for pregnancy test, result negative: Secondary | ICD-10-CM | POA: Insufficient documentation

## 2012-08-28 DIAGNOSIS — Z8639 Personal history of other endocrine, nutritional and metabolic disease: Secondary | ICD-10-CM | POA: Insufficient documentation

## 2012-08-28 DIAGNOSIS — R071 Chest pain on breathing: Secondary | ICD-10-CM | POA: Insufficient documentation

## 2012-08-28 LAB — CBC WITH DIFFERENTIAL/PLATELET
Basophils Relative: 0 % (ref 0–1)
Eosinophils Absolute: 0.1 10*3/uL (ref 0.0–0.7)
Eosinophils Relative: 1 % (ref 0–5)
Hemoglobin: 12.5 g/dL (ref 12.0–15.0)
Lymphs Abs: 2.6 10*3/uL (ref 0.7–4.0)
MCH: 32.3 pg (ref 26.0–34.0)
MCHC: 34.2 g/dL (ref 30.0–36.0)
MCV: 94.6 fL (ref 78.0–100.0)
Monocytes Relative: 8 % (ref 3–12)
Neutrophils Relative %: 61 % (ref 43–77)
Platelets: 263 10*3/uL (ref 150–400)
RBC: 3.87 MIL/uL (ref 3.87–5.11)

## 2012-08-28 LAB — URINE MICROSCOPIC-ADD ON

## 2012-08-28 LAB — URINALYSIS, ROUTINE W REFLEX MICROSCOPIC
Glucose, UA: NEGATIVE mg/dL
Ketones, ur: NEGATIVE mg/dL
Leukocytes, UA: NEGATIVE
Protein, ur: NEGATIVE mg/dL
pH: 6 (ref 5.0–8.0)

## 2012-08-28 LAB — BASIC METABOLIC PANEL
BUN: 9 mg/dL (ref 6–23)
Calcium: 8.7 mg/dL (ref 8.4–10.5)
GFR calc Af Amer: >90 mL/min (ref >90)
GFR calc non Af Amer: >90 mL/min (ref >90)
Glucose, Bld: 119 mg/dL — ABNORMAL HIGH (ref 70–99)
Potassium: 3.7 mEq/L (ref 3.5–5.1)

## 2012-08-28 MED ORDER — HYDROCODONE-ACETAMINOPHEN 5-325 MG PO TABS: ORAL_TABLET | ORAL | Status: DC

## 2012-08-28 MED ORDER — IBUPROFEN 200 MG PO TABS
400.0000 mg | ORAL_TABLET | Freq: Once | ORAL | Status: AC
  Administered 2012-08-28: 400 mg via ORAL
  Filled 2012-08-28: qty 2

## 2012-08-28 MED ORDER — DIAZEPAM 5 MG PO TABS
5.0000 mg | ORAL_TABLET | Freq: Once | ORAL | Status: AC
  Administered 2012-08-28: 5 mg via ORAL
  Filled 2012-08-28: qty 1

## 2012-08-28 MED ORDER — OXYCODONE-ACETAMINOPHEN 5-325 MG PO TABS
1.0000 | ORAL_TABLET | Freq: Once | ORAL | Status: AC
  Administered 2012-08-28: 1 via ORAL
  Filled 2012-08-28: qty 1

## 2012-08-28 MED ORDER — METHOCARBAMOL 500 MG PO TABS: 1000.0000 mg | ORAL_TABLET | Freq: Four times a day (QID) | ORAL | Status: DC | PRN

## 2012-08-28 MED ORDER — NAPROXEN 250 MG PO TABS: 250.0000 mg | ORAL_TABLET | Freq: Two times a day (BID) | ORAL | Status: DC

## 2012-08-28 NOTE — ED Notes (Signed)
Pt presents to department for evaluation of non radiating L sided chest pain. Onset @ 06:00 this morning. 10/10 pain that becomes worse with movement. Respirations unlabored. Skin warm and dry. Pt is alert and oriented x4.

## 2012-08-28 NOTE — ED Provider Notes (Signed)
History     CSN: [id number]  Arrival date & time 08/28/12  4098   First MD Initiated Contact with Patient 08/28/12 0935      Chief Complaint  Patient presents with  . Chest Pain     HPI Pt was seen at 1000.  Per pt, c/o gradual onset and persistence of constant right upper chest wall "pain" since approx 0600 this morning. Pt states she woke up with pain. States she also has pain in the right side of her neck/posterior shoulder area.  Pain worsens with palpation of the area and body position changes. States she "did a lot of cleaning" at work the day before symptoms began. Denies palpitations, no SOB/cough, no abd pain, no back pain, no N/V/D, no fevers, no focal motor weakness, no tingling/numbness in extremities.     Past Medical History  Diagnosis Date  . Depression   . GERD (gastroesophageal reflux disease)   . Migraine   . Gout     ble  . Depression   . Migraine   . Anxiety     Past Surgical History  Procedure Laterality Date  . Cesarean section  infection at incission requiring return to OR x 2  . Tubal ligation      Family History  Problem Relation Age of Onset  . Anesthesia problems Neg Hx   . Hypotension Neg Hx   . Malignant hyperthermia Neg Hx   . Pseudochol deficiency Neg Hx   . Hypertension Mother   . Arthritis Sister     History  Substance Use Topics  . Smoking status: Current Every Day Smoker -- 0.25 packs/day for 20 years    Types: Cigarettes  . Smokeless tobacco: Never Used     Comment: Currently using Nicotine patch  . Alcohol Use: 9.6 oz/week    16 Cans of beer per week     Comment: drinks 2 22 oz 2-3 times per week    OB History   Grav Para Term Preterm Abortions TAB SAB Ect Mult Living   3 3        3       Review of Systems ROS: Statement: All systems negative except as marked or noted in the HPI; Constitutional: Negative for fever and chills. ; ; Eyes: Negative for eye pain, redness and discharge. ; ; ENMT: Negative for ear pain,  hoarseness, nasal congestion, sinus pressure and sore throat. ; ; Cardiovascular: Negative for palpitations, diaphoresis, dyspnea and peripheral edema. ; ; Respiratory: Negative for cough, wheezing and stridor. ; ; Gastrointestinal: Negative for nausea, vomiting, diarrhea, abdominal pain, blood in stool, hematemesis, jaundice and rectal bleeding. . ; ; Genitourinary: Negative for dysuria, flank pain and hematuria. ; ; Musculoskeletal: +chest wall pain, right neck pain. Negative for back pain. Negative for swelling and trauma.; ; Skin: Negative for pruritus, rash, abrasions, blisters, bruising and skin lesion.; ; Neuro: Negative for headache, lightheadedness and neck stiffness. Negative for weakness, altered level of consciousness , altered mental status, extremity weakness, paresthesias, involuntary movement, seizure and syncope.      Allergies  Review of patient's allergies indicates no known allergies.  Home Medications   Current Outpatient Rx  Name  Route  Sig  Dispense  Refill  . acetaminophen (TYLENOL) 500 MG tablet   Oral   Take 1,000 mg by mouth every 6 (six) hours as needed for pain.         Marland Kitchen sertraline (ZOLOFT) 100 MG tablet   Oral   Take 100  mg by mouth daily.         . traZODone (DESYREL) 100 MG tablet   Oral   Take 100 mg by mouth at bedtime as needed for sleep.            BP 135/101  Pulse 93  Temp(Src) 98.7 F (37.1 C) (Oral)  Resp 18  SpO2 99%  LMP 07/15/2012  Physical Exam 1005: Physical examination:  Nursing notes reviewed; Vital signs and O2 SAT reviewed;  Constitutional: Well developed, Well nourished, Well hydrated, In no acute distress; Head:  Normocephalic, atraumatic; Eyes: EOMI, PERRL, No scleral icterus; ENMT: Mouth and pharynx normal, Mucous membranes moist; Neck: Supple, Full range of motion, No lymphadenopathy; Cardiovascular: Regular rate and rhythm, No murmur, rub, or gallop; Respiratory: Breath sounds clear & equal bilaterally, No rales,  rhonchi, wheezes.  Speaking full sentences with ease, Normal respiratory effort/excursion; Chest: +right upper chest wall tender to palp which reproduces pt's pain. No rash, no soft tissue crepitus. Movement normal; Abdomen: Soft, Nontender, Nondistended, Normal bowel sounds; Genitourinary: No CVA tenderness; Spine:  No midline CS, TS, LS tenderness. +TTP right hypertonic trapezius muscle; Extremities: Pulses normal, No tenderness, No edema, No calf edema or asymmetry.; Neuro: AA&Ox3, Major CN grossly intact.  Speech clear. No gross focal motor or sensory deficits in extremities.; Skin: Color normal, Warm, Dry.   ED Course  Procedures     MDM  MDM Reviewed: previous chart, nursing note and vitals Reviewed previous: labs and ECG Interpretation: labs, ECG and x-ray    Date: 08/28/2012  Rate: 78  Rhythm: normal sinus rhythm  QRS Axis: normal  Intervals: normal  ST/T Wave abnormalities: normal  Conduction Disutrbances:none  Narrative Interpretation:   Old EKG Reviewed: unchanged; no significant changes from previous EKG dated 11/15/2007.  Results for orders placed during the hospital encounter of 08/28/12  CBC WITH DIFFERENTIAL      Result Value Range   WBC 8.6  4.0 - 10.5 K/uL   RBC 3.87  3.87 - 5.11 MIL/uL   Hemoglobin 12.5  12.0 - 15.0 g/dL   HCT 62.1  30.8 - 65.7 %   MCV 94.6  78.0 - 100.0 fL   MCH 32.3  26.0 - 34.0 pg   MCHC 34.2  30.0 - 36.0 g/dL   RDW 84.6  96.2 - 95.2 %   Platelets 263  150 - 400 K/uL   Neutrophils Relative % 61  43 - 77 %   Neutro Abs 5.2  1.7 - 7.7 K/uL   Lymphocytes Relative 31  12 - 46 %   Lymphs Abs 2.6  0.7 - 4.0 K/uL   Monocytes Relative 8  3 - 12 %   Monocytes Absolute 0.7  0.1 - 1.0 K/uL   Eosinophils Relative 1  0 - 5 %   Eosinophils Absolute 0.1  0.0 - 0.7 K/uL   Basophils Relative 0  0 - 1 %   Basophils Absolute 0.0  0.0 - 0.1 K/uL  BASIC METABOLIC PANEL      Result Value Range   Sodium 140  135 - 145 mEq/L   Potassium 3.7  3.5 - 5.1  mEq/L   Chloride 107  96 - 112 mEq/L   CO2 24  19 - 32 mEq/L   Glucose, Bld 119 (*) 70 - 99 mg/dL   BUN 9  6 - 23 mg/dL   Creatinine, Ser 8.41  0.50 - 1.10 mg/dL   Calcium 8.7  8.4 - 32.4 mg/dL   GFR  calc non Af Amer >90  >90 mL/min   GFR calc Af Amer >90  >90 mL/min  URINALYSIS, ROUTINE W REFLEX MICROSCOPIC      Result Value Range   Color, Urine YELLOW  YELLOW   APPearance CLOUDY (*) CLEAR   Specific Gravity, Urine 1.026  1.005 - 1.030   pH 6.0  5.0 - 8.0   Glucose, UA NEGATIVE  NEGATIVE mg/dL   Hgb urine dipstick MODERATE (*) NEGATIVE   Bilirubin Urine SMALL (*) NEGATIVE   Ketones, ur NEGATIVE  NEGATIVE mg/dL   Protein, ur NEGATIVE  NEGATIVE mg/dL   Urobilinogen, UA 1.0  0.0 - 1.0 mg/dL   Nitrite NEGATIVE  NEGATIVE   Leukocytes, UA NEGATIVE  NEGATIVE  PREGNANCY, URINE      Result Value Range   Preg Test, Ur NEGATIVE  NEGATIVE  URINE MICROSCOPIC-ADD ON      Result Value Range   Squamous Epithelial / LPF FEW (*) RARE   WBC, UA 0-2  <3 WBC/hpf   RBC / HPF 7-10  <3 RBC/hpf   Bacteria, UA RARE  RARE  POCT I-STAT TROPONIN I      Result Value Range   Troponin i, poc 0.01  0.00 - 0.08 ng/mL   Comment 3            Dg Chest 2 View 08/28/2012   *RADIOLOGY REPORT*  Clinical Data: Chest pain  CHEST - 2 VIEW  Comparison:  January 29, 2010  Findings:  Lungs clear.  Heart size and pulmonary vascularity are normal.  No adenopathy.  No pneumothorax.  No bone lesions.  IMPRESSION: No abnormality noted.   Original Report Authenticated By: Bretta Bang, M.D.    1330:  Appears chest wall pain at this time; will tx symptomatically. Pt feels better after pain meds and wants to go home now.  Dx and testing d/w pt and family.  Questions answered.  Verb understanding, agreeable to d/c home with outpt f/u.               Laray Anger, DO 08/29/12 2057

## 2012-10-13 ENCOUNTER — Emergency Department (HOSPITAL_COMMUNITY): Payer: Medicaid Other

## 2012-10-13 ENCOUNTER — Encounter (HOSPITAL_COMMUNITY): Payer: Self-pay | Admitting: Physical Medicine and Rehabilitation

## 2012-10-13 ENCOUNTER — Emergency Department (HOSPITAL_COMMUNITY)
Admission: EM | Admit: 2012-10-13 | Discharge: 2012-10-13 | Disposition: A | Discharge status: Home / Self Care | Payer: Medicaid Other | Attending: Emergency Medicine | Admitting: Emergency Medicine

## 2012-10-13 DIAGNOSIS — Y9389 Activity, other specified: Secondary | ICD-10-CM | POA: Insufficient documentation

## 2012-10-13 DIAGNOSIS — IMO0002 Reserved for concepts with insufficient information to code with codable children: Secondary | ICD-10-CM | POA: Insufficient documentation

## 2012-10-13 DIAGNOSIS — T148XXA Other injury of unspecified body region, initial encounter: Secondary | ICD-10-CM

## 2012-10-13 DIAGNOSIS — F3289 Other specified depressive episodes: Secondary | ICD-10-CM | POA: Insufficient documentation

## 2012-10-13 DIAGNOSIS — F17200 Nicotine dependence, unspecified, uncomplicated: Secondary | ICD-10-CM | POA: Insufficient documentation

## 2012-10-13 DIAGNOSIS — M109 Gout, unspecified: Secondary | ICD-10-CM | POA: Insufficient documentation

## 2012-10-13 DIAGNOSIS — F329 Major depressive disorder, single episode, unspecified: Secondary | ICD-10-CM | POA: Insufficient documentation

## 2012-10-13 DIAGNOSIS — M79651 Pain in right thigh: Secondary | ICD-10-CM

## 2012-10-13 DIAGNOSIS — Z79899 Other long term (current) drug therapy: Secondary | ICD-10-CM | POA: Insufficient documentation

## 2012-10-13 DIAGNOSIS — W010XXA Fall on same level from slipping, tripping and stumbling without subsequent striking against object, initial encounter: Secondary | ICD-10-CM | POA: Insufficient documentation

## 2012-10-13 DIAGNOSIS — Y92009 Unspecified place in unspecified non-institutional (private) residence as the place of occurrence of the external cause: Secondary | ICD-10-CM | POA: Insufficient documentation

## 2012-10-13 DIAGNOSIS — K219 Gastro-esophageal reflux disease without esophagitis: Secondary | ICD-10-CM | POA: Insufficient documentation

## 2012-10-13 DIAGNOSIS — F411 Generalized anxiety disorder: Secondary | ICD-10-CM | POA: Insufficient documentation

## 2012-10-13 MED ORDER — METHOCARBAMOL 500 MG PO TABS
1000.0000 mg | ORAL_TABLET | Freq: Once | ORAL | Status: AC
  Administered 2012-10-13: 1000 mg via ORAL
  Filled 2012-10-13: qty 2

## 2012-10-13 MED ORDER — METHOCARBAMOL 750 MG PO TABS: 750.0000 mg | ORAL_TABLET | ORAL | Status: DC | PRN

## 2012-10-13 MED ORDER — IBUPROFEN 400 MG PO TABS
800.0000 mg | ORAL_TABLET | Freq: Once | ORAL | Status: AC
  Administered 2012-10-13: 800 mg via ORAL
  Filled 2012-10-13: qty 2

## 2012-10-13 MED ORDER — IBUPROFEN 800 MG PO TABS
800.0000 mg | ORAL_TABLET | Freq: Three times a day (TID) | ORAL | Status: DC | PRN
Start: 2012-10-13 — End: 2013-05-30

## 2012-10-13 NOTE — ED Provider Notes (Signed)
CSN: [id number]     Arrival date & time 10/13/12  1305 History  This chart was scribed for non-physician practitioner Trixie Dredge, PA-C,  working with Gerhard Munch, MD, by Yevette Edwards, ED Scribe. This patient was seen in room TR09C/TR09C and the patient's care was started at 3:10 PM.   First MD Initiated Contact with Patient 10/13/12 1505     Chief Complaint  Patient presents with  . Leg Pain    The history is provided by the patient. No language interpreter was used.   HPI Comments: Gina Shelton is a 37 y.o. female who presents to the Emergency Department complaining of sudden-onset pain to the posterior aspect of her right thigh. She reports slipping on her wooden floor last night and injuring her right leg; her family member helped her off the floor. The pt describes the pain as "aching" and  "throbbing," and she ranks the pain as 9/10. She reports that ambulation and palpation increases the pain. Denies neck pain, headache, back pain, abdominal or chest pain. She denies experiencing any numbness or tingling, hematuria, nausea, or emesis. She also denies hitting her head when she fell, suffering a LOC, or injuring any other area. The pt has not attempted to alleviate the pain with anything.   Past Medical History  Diagnosis Date  . Depression   . GERD (gastroesophageal reflux disease)   . Migraine   . Gout     ble  . Depression   . Migraine   . Anxiety    Past Surgical History  Procedure Laterality Date  . Cesarean section  infection at incission requiring return to OR x 2  . Tubal ligation     Family History  Problem Relation Age of Onset  . Anesthesia problems Neg Hx   . Hypotension Neg Hx   . Malignant hyperthermia Neg Hx   . Pseudochol deficiency Neg Hx   . Hypertension Mother   . Arthritis Sister    History  Substance Use Topics  . Smoking status: Current Every Day Smoker -- 0.25 packs/day for 20 years    Types: Cigarettes  . Smokeless tobacco: Never Used      Comment: Currently using Nicotine patch  . Alcohol Use: 0.0 oz/week     Comment: socially   OB History   Grav Para Term Preterm Abortions TAB SAB Ect Mult Living   3 3        3      Review of Systems  HENT: Negative for neck pain.   Cardiovascular: Negative for chest pain.  Gastrointestinal: Negative for nausea, vomiting and abdominal pain.  Genitourinary: Negative for hematuria.  Musculoskeletal: Positive for myalgias (Right posterior thigh. ). Negative for back pain.  Neurological: Negative for syncope.    Allergies  Review of patient's allergies indicates no known allergies.  Home Medications   Current Outpatient Rx  Name  Route  Sig  Dispense  Refill  . acetaminophen (TYLENOL) 500 MG tablet   Oral   Take 1,000 mg by mouth every 6 (six) hours as needed for pain.         Marland Kitchen sertraline (ZOLOFT) 100 MG tablet   Oral   Take 100 mg by mouth daily.         . traZODone (DESYREL) 100 MG tablet   Oral   Take 100 mg by mouth at bedtime as needed for sleep.           Triage Vitals: BP 147/86  Pulse 101  Temp(Src) 98.6 F (37 C) (Oral)  Resp 18  SpO2 99%  Physical Exam  Nursing note and vitals reviewed. Constitutional: She appears well-developed and well-nourished. No distress.  Pt crying while giving history  HENT:  Head: Normocephalic and atraumatic.  Neck: Neck supple.  Cardiovascular: Intact distal pulses.   Pulmonary/Chest: Effort normal.  Abdominal: Soft. She exhibits no distension. There is no tenderness. There is no rebound and no guarding.  Musculoskeletal: She exhibits tenderness. She exhibits no edema.       Right hip: She exhibits decreased range of motion. She exhibits no swelling, no crepitus and no deformity.       Left hip: Normal.       Right knee: Normal.       Left knee: Normal.       Right ankle: Normal.       Right upper leg: She exhibits tenderness. She exhibits no bony tenderness, no swelling, no edema, no deformity and no  laceration.       Legs:      Right foot: Normal.  Neurological: She is alert.  Skin: She is not diaphoretic.    ED Course   DIAGNOSTIC STUDIES: Oxygen Saturation is 99% on room air, normal by my interpretation.    COORDINATION OF CARE:  3:15 PM- Discussed treatment plan with patient which includes imaging and pain medication, and the patient agreed to the plan.   Procedures (including critical care time)  Labs Reviewed - No data to display Dg Hip Complete Right  10/13/2012   *RADIOLOGY REPORT*  Clinical Data: Right hip pain following injury  RIGHT HIP - COMPLETE 2+ VIEW  Comparison: None.  Findings: No acute fracture or dislocation is noted.  The pelvic ring is intact.  Some stable degenerative changes of the sacroiliac joints are seen.  IMPRESSION: No acute abnormality noted.   Original Report Authenticated By: Alcide Clever, M.D.     1. Right thigh pain   2. Muscle strain     MDM  Pt with mechanical fall last night, pain in posterior right thigh immediately and today.  No bony tenderness but pt does have pain with passive ROM of right hip.  Pt is particularly tender  over hamstrings on right.  Likely muscle strain.  No e/o full muscle tear.  Xray of right hip negative.  Pt d/c home with robaxin and ibuprofen, ice pack, crutches.  Discussed all results,  findings, treatment, follow up  with patient.  Pt given return precautions.  Pt verbalizes understanding and agrees with plan.     I personally performed the services described in this documentation, which was scribed in my presence. The recorded information has been reviewed and is accurate.   [place], PA-C 10/13/12 1709

## 2012-10-13 NOTE — ED Notes (Signed)
Pt presents to department for evaluation of R leg pain. States she fell and landed on leg last night. Now states 10/10 pain, increases with movement. No obvious deformities noted. NAD.

## 2012-10-14 NOTE — ED Provider Notes (Signed)
  Medical screening examination/treatment/procedure(s) were performed by non-physician practitioner and as supervising physician I was immediately available for consultation/collaboration.    Gerhard Munch, MD 10/14/12 0000

## 2013-05-30 ENCOUNTER — Emergency Department (EMERGENCY_DEPARTMENT_HOSPITAL)
Admission: EM | Admit: 2013-05-30 | Discharge: 2013-06-01 | Disposition: A | Discharge status: Other Acute Inpatient Hospital | Payer: Medicaid Other | Source: Home / Self Care | Attending: Emergency Medicine | Admitting: Emergency Medicine

## 2013-05-30 ENCOUNTER — Ambulatory Visit (HOSPITAL_COMMUNITY)
Admission: RE | Admit: 2013-05-30 | Discharge: 2013-05-30 | Disposition: A | Discharge status: Home / Self Care | Payer: Medicaid Other | Attending: Psychiatry | Admitting: Psychiatry

## 2013-05-30 ENCOUNTER — Encounter (HOSPITAL_COMMUNITY): Payer: Self-pay | Admitting: Emergency Medicine

## 2013-05-30 DIAGNOSIS — H5316 Psychophysical visual disturbances: Secondary | ICD-10-CM | POA: Insufficient documentation

## 2013-05-30 DIAGNOSIS — Z79899 Other long term (current) drug therapy: Secondary | ICD-10-CM | POA: Insufficient documentation

## 2013-05-30 DIAGNOSIS — F411 Generalized anxiety disorder: Secondary | ICD-10-CM

## 2013-05-30 DIAGNOSIS — F17200 Nicotine dependence, unspecified, uncomplicated: Secondary | ICD-10-CM | POA: Insufficient documentation

## 2013-05-30 DIAGNOSIS — F1920 Other psychoactive substance dependence, uncomplicated: Secondary | ICD-10-CM | POA: Diagnosis present

## 2013-05-30 DIAGNOSIS — Z8679 Personal history of other diseases of the circulatory system: Secondary | ICD-10-CM | POA: Insufficient documentation

## 2013-05-30 DIAGNOSIS — Z8719 Personal history of other diseases of the digestive system: Secondary | ICD-10-CM | POA: Insufficient documentation

## 2013-05-30 DIAGNOSIS — R51 Headache: Secondary | ICD-10-CM | POA: Insufficient documentation

## 2013-05-30 DIAGNOSIS — R443 Hallucinations, unspecified: Secondary | ICD-10-CM

## 2013-05-30 DIAGNOSIS — F1010 Alcohol abuse, uncomplicated: Secondary | ICD-10-CM | POA: Insufficient documentation

## 2013-05-30 DIAGNOSIS — Z8249 Family history of ischemic heart disease and other diseases of the circulatory system: Secondary | ICD-10-CM

## 2013-05-30 DIAGNOSIS — F329 Major depressive disorder, single episode, unspecified: Secondary | ICD-10-CM

## 2013-05-30 DIAGNOSIS — G4700 Insomnia, unspecified: Secondary | ICD-10-CM | POA: Diagnosis present

## 2013-05-30 DIAGNOSIS — M109 Gout, unspecified: Secondary | ICD-10-CM | POA: Diagnosis present

## 2013-05-30 DIAGNOSIS — F1410 Cocaine abuse, uncomplicated: Secondary | ICD-10-CM

## 2013-05-30 DIAGNOSIS — F1210 Cannabis abuse, uncomplicated: Secondary | ICD-10-CM | POA: Diagnosis present

## 2013-05-30 DIAGNOSIS — Z862 Personal history of diseases of the blood and blood-forming organs and certain disorders involving the immune mechanism: Secondary | ICD-10-CM

## 2013-05-30 DIAGNOSIS — R45851 Suicidal ideations: Secondary | ICD-10-CM | POA: Insufficient documentation

## 2013-05-30 DIAGNOSIS — Z8639 Personal history of other endocrine, nutritional and metabolic disease: Secondary | ICD-10-CM

## 2013-05-30 DIAGNOSIS — F332 Major depressive disorder, recurrent severe without psychotic features: Secondary | ICD-10-CM | POA: Diagnosis present

## 2013-05-30 DIAGNOSIS — F1994 Other psychoactive substance use, unspecified with psychoactive substance-induced mood disorder: Secondary | ICD-10-CM | POA: Diagnosis present

## 2013-05-30 DIAGNOSIS — Z23 Encounter for immunization: Secondary | ICD-10-CM

## 2013-05-30 DIAGNOSIS — K219 Gastro-esophageal reflux disease without esophagitis: Secondary | ICD-10-CM | POA: Diagnosis present

## 2013-05-30 DIAGNOSIS — F3289 Other specified depressive episodes: Secondary | ICD-10-CM | POA: Insufficient documentation

## 2013-05-30 DIAGNOSIS — F1020 Alcohol dependence, uncomplicated: Principal | ICD-10-CM | POA: Diagnosis present

## 2013-05-30 DIAGNOSIS — F32A Depression, unspecified: Secondary | ICD-10-CM

## 2013-05-30 LAB — COMPREHENSIVE METABOLIC PANEL
ALK PHOS: 98 U/L (ref 39–117)
ALT: 8 U/L (ref 0–35)
AST: 17 U/L (ref 0–37)
Albumin: 3.9 g/dL (ref 3.5–5.2)
BUN: 6 mg/dL (ref 6–23)
CALCIUM: 9.2 mg/dL (ref 8.4–10.5)
CO2: 23 meq/L (ref 19–32)
Chloride: 104 mEq/L (ref 96–112)
Creatinine, Ser: 0.59 mg/dL (ref 0.50–1.10)
GLUCOSE: 109 mg/dL — AB (ref 70–99)
POTASSIUM: 4.2 meq/L (ref 3.7–5.3)
Sodium: 144 mEq/L (ref 137–147)
TOTAL PROTEIN: 7.9 g/dL (ref 6.0–8.3)
Total Bilirubin: <0.2 mg/dL — ABNORMAL LOW (ref 0.3–1.2)

## 2013-05-30 LAB — RAPID URINE DRUG SCREEN, HOSP PERFORMED
AMPHETAMINES: NOT DETECTED
BENZODIAZEPINES: NOT DETECTED
Barbiturates: NOT DETECTED
Cocaine: POSITIVE — AB
OPIATES: NOT DETECTED
TETRAHYDROCANNABINOL: POSITIVE — AB

## 2013-05-30 LAB — CBC
HEMATOCRIT: 33.7 % — AB (ref 36.0–46.0)
HEMOGLOBIN: 10.9 g/dL — AB (ref 12.0–15.0)
MCH: 27.2 pg (ref 26.0–34.0)
MCHC: 32.3 g/dL (ref 30.0–36.0)
MCV: 84 fL (ref 78.0–100.0)
Platelets: 341 10*3/uL (ref 150–400)
RBC: 4.01 MIL/uL (ref 3.87–5.11)
RDW: 16.2 % — ABNORMAL HIGH (ref 11.5–15.5)
WBC: 6.1 10*3/uL (ref 4.0–10.5)

## 2013-05-30 LAB — ACETAMINOPHEN LEVEL: Acetaminophen (Tylenol), Serum: <15 ug/mL (ref 10–30)

## 2013-05-30 LAB — SALICYLATE LEVEL: Salicylate Lvl: 2 mg/dL — ABNORMAL LOW (ref 2.8–20.0)

## 2013-05-30 LAB — ETHANOL: Alcohol, Ethyl (B): 94 mg/dL — ABNORMAL HIGH (ref 0–11)

## 2013-05-30 MED ORDER — LORAZEPAM 1 MG PO TABS: 0.0000 mg | ORAL_TABLET | Freq: Two times a day (BID) | ORAL | Status: DC

## 2013-05-30 MED ORDER — THIAMINE HCL 100 MG/ML IJ SOLN: 100.0000 mg | Freq: Every day | INTRAMUSCULAR | Status: DC

## 2013-05-30 MED ORDER — LORAZEPAM 1 MG PO TABS
0.0000 mg | ORAL_TABLET | Freq: Four times a day (QID) | ORAL | Status: DC
  Administered 2013-05-30: 2 mg via ORAL
  Administered 2013-05-31 (×4): 1 mg via ORAL
  Filled 2013-05-30: qty 2
  Filled 2013-05-30 (×4): qty 1

## 2013-05-30 MED ORDER — ACETAMINOPHEN 325 MG PO TABS
650.0000 mg | ORAL_TABLET | ORAL | Status: DC | PRN
  Administered 2013-05-31: 650 mg via ORAL
  Filled 2013-05-30: qty 2

## 2013-05-30 MED ORDER — ZOLPIDEM TARTRATE 5 MG PO TABS
5.0000 mg | ORAL_TABLET | Freq: Every evening | ORAL | Status: DC | PRN
  Administered 2013-05-30 – 2013-05-31 (×2): 5 mg via ORAL
  Filled 2013-05-30 (×2): qty 1

## 2013-05-30 MED ORDER — VITAMIN B-1 100 MG PO TABS
100.0000 mg | ORAL_TABLET | Freq: Every day | ORAL | Status: DC
  Administered 2013-05-30 – 2013-06-01 (×3): 100 mg via ORAL
  Filled 2013-05-30 (×3): qty 1

## 2013-05-30 MED ORDER — ONDANSETRON HCL 4 MG PO TABS: 4.0000 mg | ORAL_TABLET | Freq: Three times a day (TID) | ORAL | Status: DC | PRN

## 2013-05-30 MED ORDER — IBUPROFEN 200 MG PO TABS
600.0000 mg | ORAL_TABLET | Freq: Three times a day (TID) | ORAL | Status: DC | PRN
  Administered 2013-05-31: 600 mg via ORAL
  Filled 2013-05-30: qty 3
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
## 2013-05-30 NOTE — ED Provider Notes (Signed)
Medical screening examination/treatment/procedure(s) were performed by non-physician practitioner and as supervising physician I was immediately available for consultation/collaboration.   EKG Interpretation None        Ephraim Hamburger, MD 05/30/13 1820
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
## 2013-05-30 NOTE — Progress Notes (Signed)
Attempted Placement at the follow facilities:    [facility]- faxed  [place]- no beds  [place]- no beds  [facility]- left message  [place]- no beds  [place]- faxed  [facility] [place]- will reviews screens  [place]- no beds  [facility] [place]- only 1 adult female bed  [facility]- only low acuity adults.  [place]- only female beds   Chesley Noon, MSW, [place], 05/30/2013  Evening Clinical Social Worker  [phone]

## 2013-05-30 NOTE — ED Notes (Signed)
Gina Shelton has been cooperative with staff since admission to unit.  Has been tearful at times, especially when talking about her drug use and her children.  States her three kids are with her mom and her mom is aware of the fact that she is here.  Has started on Ativan protocol.  Is experiencing some withdrawal symptoms, but has also been able to get some sleep.  Appetite is fair.

## 2013-05-31 DIAGNOSIS — F1910 Other psychoactive substance abuse, uncomplicated: Secondary | ICD-10-CM

## 2013-05-31 DIAGNOSIS — F332 Major depressive disorder, recurrent severe without psychotic features: Secondary | ICD-10-CM

## 2013-05-31 DIAGNOSIS — F1010 Alcohol abuse, uncomplicated: Secondary | ICD-10-CM

## 2013-05-31 DIAGNOSIS — R45851 Suicidal ideations: Secondary | ICD-10-CM

## 2013-05-31 MED ORDER — WHITE PETROLATUM GEL
Status: AC
Start: 2013-05-31 — End: 2013-05-31
  Administered 2013-05-31: 11:00:00
  Filled 2013-05-31: qty 25

## 2013-05-31 MED ORDER — NICOTINE 21 MG/24HR TD PT24
21.0000 mg | MEDICATED_PATCH | Freq: Once | TRANSDERMAL | Status: DC
  Administered 2013-05-31 – 2013-06-01 (×2): 21 mg via TRANSDERMAL
  Filled 2013-05-31 (×2): qty 1
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
## 2013-05-31 NOTE — ED Notes (Signed)
Patient up talking on the phone. No acute distress noted.

## 2013-06-01 ENCOUNTER — Inpatient Hospital Stay (HOSPITAL_COMMUNITY)
Admission: AD | Admit: 2013-06-01 | Discharge: 2013-06-08 | DRG: 897 | Disposition: A | Discharge status: Home / Self Care | Payer: Medicaid Other | Source: Intra-hospital | Attending: Psychiatry | Admitting: Psychiatry

## 2013-06-01 ENCOUNTER — Encounter (HOSPITAL_COMMUNITY): Payer: Self-pay | Admitting: *Deleted

## 2013-06-01 DIAGNOSIS — F339 Major depressive disorder, recurrent, unspecified: Secondary | ICD-10-CM | POA: Diagnosis present

## 2013-06-01 DIAGNOSIS — F1020 Alcohol dependence, uncomplicated: Secondary | ICD-10-CM | POA: Diagnosis present

## 2013-06-01 DIAGNOSIS — F1410 Cocaine abuse, uncomplicated: Secondary | ICD-10-CM | POA: Diagnosis present

## 2013-06-01 DIAGNOSIS — F410 Panic disorder [episodic paroxysmal anxiety] without agoraphobia: Secondary | ICD-10-CM

## 2013-06-01 DIAGNOSIS — F411 Generalized anxiety disorder: Secondary | ICD-10-CM

## 2013-06-01 DIAGNOSIS — F329 Major depressive disorder, single episode, unspecified: Secondary | ICD-10-CM | POA: Diagnosis present

## 2013-06-01 MED ORDER — SERTRALINE HCL 100 MG PO TABS
ORAL_TABLET | ORAL | Status: AC
  Administered 2013-06-01: 100 mg
  Filled 2013-06-01: qty 1

## 2013-06-01 MED ORDER — ZOLPIDEM TARTRATE 5 MG PO TABS
5.0000 mg | ORAL_TABLET | Freq: Every evening | ORAL | Status: DC | PRN
  Administered 2013-06-01 – 2013-06-07 (×6): 5 mg via ORAL
  Filled 2013-06-01 (×6): qty 1

## 2013-06-01 MED ORDER — MAGNESIUM HYDROXIDE 400 MG/5ML PO SUSP
30.0000 mL | Freq: Every day | ORAL | Status: DC | PRN
  Administered 2013-06-03: 30 mL via ORAL

## 2013-06-01 MED ORDER — PNEUMOCOCCAL VAC POLYVALENT 25 MCG/0.5ML IJ INJ: 0.5000 mL | INJECTION | INTRAMUSCULAR | Status: DC

## 2013-06-01 MED ORDER — THIAMINE HCL 100 MG/ML IJ SOLN
100.0000 mg | Freq: Once | INTRAMUSCULAR | Status: AC
  Administered 2013-06-01: 100 mg via INTRAMUSCULAR

## 2013-06-01 MED ORDER — NICOTINE 21 MG/24HR TD PT24
21.0000 mg | MEDICATED_PATCH | Freq: Once | TRANSDERMAL | Status: AC
  Administered 2013-06-02: 21 mg via TRANSDERMAL
  Filled 2013-06-01 (×2): qty 1

## 2013-06-01 MED ORDER — VITAMIN B-1 100 MG PO TABS
100.0000 mg | ORAL_TABLET | Freq: Every day | ORAL | Status: DC
  Administered 2013-06-02 – 2013-06-08 (×7): 100 mg via ORAL
  Filled 2013-06-01 (×9): qty 1

## 2013-06-01 MED ORDER — SERTRALINE HCL 100 MG PO TABS
100.0000 mg | ORAL_TABLET | Freq: Every day | ORAL | Status: DC
  Administered 2013-06-01 – 2013-06-05 (×5): 100 mg via ORAL
  Filled 2013-06-01 (×8): qty 1

## 2013-06-01 MED ORDER — ADULT MULTIVITAMIN W/MINERALS CH
1.0000 | ORAL_TABLET | Freq: Every day | ORAL | Status: DC
  Administered 2013-06-01 – 2013-06-08 (×8): 1 via ORAL
  Filled 2013-06-01 (×10): qty 1

## 2013-06-01 MED ORDER — ASPIRIN-ACETAMINOPHEN-CAFFEINE 250-250-65 MG PO TABS
1.0000 | ORAL_TABLET | Freq: Four times a day (QID) | ORAL | Status: DC | PRN
  Filled 2013-06-01: qty 2

## 2013-06-01 MED ORDER — INFLUENZA VAC SPLIT QUAD 0.5 ML IM SUSP
0.5000 mL | INTRAMUSCULAR | Status: DC
  Filled 2013-06-01: qty 0.5

## 2013-06-01 MED ORDER — LORAZEPAM 1 MG PO TABS
1.0000 mg | ORAL_TABLET | Freq: Once | ORAL | Status: AC
  Administered 2013-06-01: 1 mg via ORAL
  Filled 2013-06-01: qty 1

## 2013-06-01 MED ORDER — VITAMIN B-1 100 MG PO TABS: 100.0000 mg | ORAL_TABLET | Freq: Every day | ORAL | Status: DC

## 2013-06-01 MED ORDER — CHLORDIAZEPOXIDE HCL 25 MG PO CAPS
25.0000 mg | ORAL_CAPSULE | Freq: Three times a day (TID) | ORAL | Status: AC
  Administered 2013-06-02 (×3): 25 mg via ORAL
  Filled 2013-06-01 (×3): qty 1

## 2013-06-01 MED ORDER — ONDANSETRON HCL 4 MG PO TABS: 4.0000 mg | ORAL_TABLET | Freq: Three times a day (TID) | ORAL | Status: DC | PRN

## 2013-06-01 MED ORDER — LORAZEPAM 1 MG PO TABS: 0.0000 mg | ORAL_TABLET | Freq: Four times a day (QID) | ORAL | Status: DC

## 2013-06-01 MED ORDER — ALUM & MAG HYDROXIDE-SIMETH 200-200-20 MG/5ML PO SUSP: 30.0000 mL | ORAL | Status: DC | PRN

## 2013-06-01 MED ORDER — TRAZODONE HCL 100 MG PO TABS
100.0000 mg | ORAL_TABLET | Freq: Every evening | ORAL | Status: DC | PRN
  Administered 2013-06-01 – 2013-06-07 (×6): 100 mg via ORAL
  Filled 2013-06-01: qty 3
  Filled 2013-06-01 (×6): qty 1

## 2013-06-01 MED ORDER — ONDANSETRON 4 MG PO TBDP
4.0000 mg | ORAL_TABLET | Freq: Four times a day (QID) | ORAL | Status: AC | PRN
  Administered 2013-06-01 – 2013-06-02 (×2): 4 mg via ORAL
  Filled 2013-06-01 (×2): qty 1

## 2013-06-01 MED ORDER — CHLORDIAZEPOXIDE HCL 25 MG PO CAPS
25.0000 mg | ORAL_CAPSULE | Freq: Four times a day (QID) | ORAL | Status: AC
  Administered 2013-06-01 (×3): 25 mg via ORAL
  Filled 2013-06-01 (×3): qty 1

## 2013-06-01 MED ORDER — LORAZEPAM 1 MG PO TABS: 0.0000 mg | ORAL_TABLET | Freq: Two times a day (BID) | ORAL | Status: DC

## 2013-06-01 MED ORDER — CHLORDIAZEPOXIDE HCL 25 MG PO CAPS
25.0000 mg | ORAL_CAPSULE | Freq: Four times a day (QID) | ORAL | Status: AC | PRN
  Administered 2013-06-02 – 2013-06-03 (×3): 25 mg via ORAL
  Filled 2013-06-01 (×3): qty 1

## 2013-06-01 MED ORDER — CHLORDIAZEPOXIDE HCL 25 MG PO CAPS
25.0000 mg | ORAL_CAPSULE | ORAL | Status: AC
  Administered 2013-06-03 (×2): 25 mg via ORAL
  Filled 2013-06-01 (×2): qty 1

## 2013-06-01 MED ORDER — LOPERAMIDE HCL 2 MG PO CAPS: 2.0000 mg | ORAL_CAPSULE | ORAL | Status: AC | PRN

## 2013-06-01 MED ORDER — SENNOSIDES-DOCUSATE SODIUM 8.6-50 MG PO TABS
1.0000 | ORAL_TABLET | Freq: Every day | ORAL | Status: DC
  Administered 2013-06-01 – 2013-06-07 (×6): 1 via ORAL
  Filled 2013-06-01 (×11): qty 1

## 2013-06-01 MED ORDER — CHLORDIAZEPOXIDE HCL 25 MG PO CAPS
25.0000 mg | ORAL_CAPSULE | Freq: Every day | ORAL | Status: AC
  Administered 2013-06-04: 25 mg via ORAL
  Filled 2013-06-01: qty 1

## 2013-06-01 MED ORDER — HYDROXYZINE HCL 25 MG PO TABS
25.0000 mg | ORAL_TABLET | Freq: Four times a day (QID) | ORAL | Status: AC | PRN
  Administered 2013-06-01 – 2013-06-04 (×4): 25 mg via ORAL
  Filled 2013-06-01 (×5): qty 1

## 2013-06-01 MED ORDER — IBUPROFEN 600 MG PO TABS
600.0000 mg | ORAL_TABLET | Freq: Three times a day (TID) | ORAL | Status: DC | PRN
  Administered 2013-06-04: 600 mg via ORAL
  Filled 2013-06-01: qty 1

## 2013-06-01 MED ORDER — ASPIRIN-ACETAMINOPHEN-CAFFEINE 250-250-65 MG PO TABS
2.0000 | ORAL_TABLET | Freq: Once | ORAL | Status: AC
  Administered 2013-06-01: 2 via ORAL
  Filled 2013-06-01: qty 2

## 2013-06-01 MED ORDER — ACETAMINOPHEN 325 MG PO TABS
650.0000 mg | ORAL_TABLET | ORAL | Status: DC | PRN
  Administered 2013-06-01: 650 mg via ORAL
  Filled 2013-06-01: qty 2

## 2013-06-01 MED ORDER — THIAMINE HCL 100 MG/ML IJ SOLN: 100.0000 mg | Freq: Every day | INTRAMUSCULAR | Status: DC

## 2013-06-01 NOTE — Progress Notes (Signed)
[place] Group Notes:  (Nursing/MHT/Case Management/Adjunct)  Date:  06/01/2013  Time:  3:15PM  Type of Therapy:  Psychoeducational Skills  Participation Level:  Active  Participation Quality:  Appropriate and Attentive  Affect:  Appropriate  Cognitive:  Appropriate  Insight:  Appropriate  Engagement in Group:  Engaged and Supportive  Modes of Intervention:  Activity  Summary of Progress/Problems: Pts played a game of Pictionary using different coping skills. Pts were asked one coping skill they have learned while here at the hospital. Pt identified a coping skill of when you feel like doing something negative or are in a negative situation, look for something positive.   Clint Bolder 06/01/2013, 6:50 PM
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
## 2013-06-01 NOTE — Progress Notes (Signed)
D.  Pt pleasant but anxious on approach, heavy withdrawal.  States very shakey and that she really hates feeling like that.  Positive for evening group, interacting appropriately on unit.  Denies SI/HI/hallucinations at this time.  A.  Support and encouragement offered, medication given as ordered for withdrawal.  R.  Pt remains safe on unit, will continue to monitor.

## 2013-06-01 NOTE — ED Notes (Signed)
Per one time order from Thedora Hinders NP Ativan 1mg  given for CIWA of 8.

## 2013-06-01 NOTE — Progress Notes (Signed)
Adult Psychoeducational Group Note  Date:  06/01/2013 Time:  1315  Group Topic/Focus:  Relapse Prevention Planning:   The focus of this group is to define relapse and discuss the need for planning to combat relapse.  Participation Level:  Minimal  Participation Quality:  Appropriate and Attentive  Affect:  Appropriate  Cognitive:  Alert, Appropriate and Oriented  Insight: Improving  Engagement in Group:  Engaged  Modes of Intervention:  Discussion and Education  Additional Comments:  Pt sat quietly during group but listened attentively. Pt showed interest in addiction recovery.   Gina Shelton Gina Shelton 06/01/2013, 2:45 PM
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
## 2013-06-01 NOTE — ED Notes (Addendum)
Patient to be admitted to [facility] bed 304.02.  Report called to Drake Leach RN. Patient admitted to service of [place].  Patient positive for SI with no plan and contracts for safety.  Denies HI and A/VH.  Positive for ETOH, Cocaine and THC.  Has complained of nausea and tremors this am that were relieved with medication.  MHT accompanied [place] with patient to [facility]. Patient ambulatory. Four  bags of belongings sent with patient.

## 2013-06-02 DIAGNOSIS — F329 Major depressive disorder, single episode, unspecified: Secondary | ICD-10-CM

## 2013-06-02 DIAGNOSIS — F1020 Alcohol dependence, uncomplicated: Principal | ICD-10-CM

## 2013-06-02 MED ORDER — GABAPENTIN 100 MG PO CAPS
100.0000 mg | ORAL_CAPSULE | Freq: Three times a day (TID) | ORAL | Status: DC
  Administered 2013-06-02 – 2013-06-08 (×18): 100 mg via ORAL
  Filled 2013-06-02 (×9): qty 1
  Filled 2013-06-02: qty 9
  Filled 2013-06-02: qty 1
  Filled 2013-06-02: qty 9
  Filled 2013-06-02 (×2): qty 1
  Filled 2013-06-02: qty 9
  Filled 2013-06-02 (×6): qty 1
  Filled 2013-06-02 (×2): qty 9
  Filled 2013-06-02: qty 1
  Filled 2013-06-02: qty 9

## 2013-06-02 MED ORDER — ASPIRIN-ACETAMINOPHEN-CAFFEINE 250-250-65 MG PO TABS
2.0000 | ORAL_TABLET | Freq: Four times a day (QID) | ORAL | Status: DC | PRN
  Administered 2013-06-02 – 2013-06-07 (×8): 2 via ORAL
  Filled 2013-06-02 (×7): qty 2

## 2013-06-02 MED ORDER — NICOTINE 21 MG/24HR TD PT24
21.0000 mg | MEDICATED_PATCH | Freq: Every day | TRANSDERMAL | Status: DC
  Administered 2013-06-03 – 2013-06-08 (×6): 21 mg via TRANSDERMAL
  Filled 2013-06-02 (×9): qty 1

## 2013-06-02 NOTE — Progress Notes (Signed)
D. Pt pleasant on approach, complaint of withdrawal related anxiety and tremors.  Positive for evening wrap up group, see group notes.  Interacting appropriately within milieu.  Pt had received shot to arm yesterday and was unsure therefor if she had had the pneumonia or flu vaccine so therefor did not receive it today.  Looking back on Pt's records, the shot Pt received in her arm was the thiamine shot.  Explained this to Pt and Pt hopeful therefor to get the pneumonia and flu vaccine tomorrow.  Pt denies SI/HI/hallucinations at this time.  A.  Support and encouragement offered.  Medication given as ordered for withdrawal.  R.  Pt remains safe in dayroom with peers watching TV.  Will continue to monitor.

## 2013-06-02 NOTE — [facility] Group Notes (Signed)
[place] Group Notes:  (Nursing/MHT/Case Management/Adjunct)  Date:  06/02/2013  Time:  10:53 AM  Type of Therapy:  Psychoeducational Skills  Participation Level:  Did Not Attend  Ventura Sellers 06/02/2013, 10:53 AM
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
## 2013-06-02 NOTE — Progress Notes (Signed)
[place] Group Notes:  (Nursing/MHT/Case Management/Adjunct)  Date:  06/02/2013  Time:  3:15 PM  Type of Therapy:  Psychoeducational Skills  Participation Level:  Active  Participation Quality:  Appropriate, Attentive and Supportive  Affect:  Appropriate  Cognitive:  Appropriate  Insight:  Appropriate  Engagement in Group:  Engaged and Supportive  Modes of Intervention:  Activity  Summary of Progress/Problems: Pts played a game using the Therapeutic Activity Ball. Pts were asked one thing they have learned while at the hospital. Pt stated she has learned that she needs to be more responsible and take control of her actions.  Clint Bolder 06/02/2013, 6:29 PM
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
## 2013-06-02 NOTE — Progress Notes (Signed)
Patient ID: Gina Shelton, female   DOB: 1975/04/25, 38 y.o.   MRN: [id number] D: patient reports withdrawal symptoms with diarrhea, cravings, agitation and tremors.  She denies any SI/HI/AVH.  She reports poor sleep and appetite.  Her energy level remains low.  She reports depressive symptoms rating her depression as a 9.  She rates her hopelessness as a 9.  Patient appears anxious with sad affect.  Patient states, "I'm feeling weak today."  A: continue to monitor medication management and MD orders.  Safety checks completed every 15 minutes per protocol.  R: patient is receptive to staff; her behavior is appropriate.

## 2013-06-02 NOTE — [facility] Group Notes (Signed)
[facility] LCSW Group Therapy  06/02/2013 10:00 AM  Type of Therapy:  Group Therapy  Participation Level:  Did Not Attend   Gina Shelton

## 2013-06-02 NOTE — Progress Notes (Signed)
D. Pt has been visible in milieu this afternoon, unable to participate in activities as pt in active withdrawal, has shakiness, chills, anxiety, diaphoretic, and headache. Pt has been receiving medications without incident today. Pt does endorse depression and hopelessness, denies SI but able to contract for safety on the unit. A. Support and encouragement provided. R. Safety maintained, will continue to monitor.

## 2013-06-03 MED ORDER — CARBAMAZEPINE ER 200 MG PO TB12
200.0000 mg | ORAL_TABLET | Freq: Two times a day (BID) | ORAL | Status: DC
  Administered 2013-06-03 – 2013-06-08 (×10): 200 mg via ORAL
  Filled 2013-06-03: qty 8
  Filled 2013-06-03 (×7): qty 1
  Filled 2013-06-03: qty 8
  Filled 2013-06-03 (×4): qty 1
  Filled 2013-06-03 (×2): qty 6
  Filled 2013-06-03: qty 1

## 2013-06-03 MED ORDER — ENSURE COMPLETE PO LIQD
237.0000 mL | Freq: Two times a day (BID) | ORAL | Status: DC
  Administered 2013-06-03 – 2013-06-08 (×10): 237 mL via ORAL

## 2013-06-03 MED ORDER — RISPERIDONE 1 MG PO TABS
1.0000 mg | ORAL_TABLET | Freq: Every day | ORAL | Status: DC
  Administered 2013-06-03 – 2013-06-07 (×5): 1 mg via ORAL
  Filled 2013-06-03 (×2): qty 1
  Filled 2013-06-03: qty 3
  Filled 2013-06-03 (×2): qty 1
  Filled 2013-06-03: qty 3
  Filled 2013-06-03 (×2): qty 1

## 2013-06-03 NOTE — Progress Notes (Addendum)
[facility] MD Progress Note  06/03/2013 4:21 PM Gina Shelton  MRN:  [id number]  Subjective: Gina Shelton reports, "I'm not right, I don't feel right. I'm not in a good mood. I'm withdrawing badly. I have the sweats, tremors. My mood is swingimg. My mind is flipping. Please do something"  Gina Shelton is having a hard time. She appears restless, agitated and anxious. However, she is able to participate in group sessions.  Diagnosis:   DSM5: Schizophrenia Disorders:  NA Obsessive-Compulsive Disorders:  NA Trauma-Stressor Disorders:  NA Substance/Addictive Disorders:  Alcohol Related Disorder - Severe (303.90) Depressive Disorders:  Major Depressive Disorder - Severe (296.23) Total Time spent with patient: 30 minutes  Axis I: Alcohol Related Disorder - Severe (303.90), Major depressive disorder Axis II: Deferred Axis III:  Past Medical History  Diagnosis Date  . Depression   . GERD (gastroesophageal reflux disease)   . Migraine   . Gout     ble  . Depression   . Migraine   . Anxiety    Axis IV: other psychosocial or environmental problems and Polysubstance dependence Axis V: Serious symptoms  ADL's:  Dischaeveled  Sleep: Fair  Appetite:  Fair  Suicidal Ideation:  Plan:  Denies Intent:  Denies Means:  Denies Homicidal Ideation:  Plan:  Denies Intent:  Denies Means:  Denies AEB (as evidenced by):  Psychiatric Specialty Exam: Physical Exam  Psychiatric: Her speech is normal and behavior is normal. Judgment and thought content normal. Her mood appears anxious. Cognition and memory are normal. She exhibits a depressed mood.    Review of Systems  Constitutional: Positive for chills, malaise/fatigue and diaphoresis.  HENT: Negative.   Eyes: Negative.   Respiratory: Negative.   Cardiovascular: Negative.   Gastrointestinal: Negative.   Genitourinary: Negative.   Musculoskeletal: Negative.   Skin: Negative.   Neurological: Positive for tremors and weakness.   Endo/Heme/Allergies: Negative.   Psychiatric/Behavioral: Positive for depression and substance abuse. Negative for suicidal ideas, hallucinations and memory loss. The patient is nervous/anxious and has insomnia.     Blood pressure 117/80, pulse 109, temperature 97.5 F (36.4 C), temperature source Oral, resp. rate 16, height 5\' 5"  (1.651 m), weight 66.225 kg (146 lb), last menstrual period 05/29/2013, SpO2 100.00%.Body mass index is 24.3 kg/(m^2).  General Appearance: Disheveled  Eye Contact::  Poor  Speech:  Clear and Coherent  Volume:  Increased  Mood:  Anxious, Depressed, Hopeless and Irritable  Affect:  Congruent  Thought Process:  Coherent  Orientation:  Full (Time, Place, and Person)  Thought Content:  Rumination  Suicidal Thoughts:  No  Homicidal Thoughts:  No  Memory:  Immediate;   Good Recent;   Good Remote;   Good  Judgement:  Fair  Insight:  Present  Psychomotor Activity:  Restlessness and Tremor  Concentration:  Poor  Recall:  Good  Fund of Knowledge:Fair  Language: Good  Akathisia:  No  Handed:  Right  AIMS (if indicated):     Assets:  Desire for Improvement  Sleep:  Number of Hours: 6.25   Musculoskeletal: Strength & Muscle Tone: within normal limits Gait & Station: normal Patient leans: N/A  Current Medications: Current Facility-Administered Medications  Medication Dose Route Frequency Provider Last Rate Last Dose  . acetaminophen (TYLENOL) tablet 650 mg  650 mg Oral Q4H PRN Merian Capron, MD   650 mg at 06/01/13 1315  . alum & mag hydroxide-simeth (MAALOX/MYLANTA) 200-200-20 MG/5ML suspension 30 mL  30 mL Oral Q4H PRN Merian Capron, MD      .  aspirin-acetaminophen-caffeine (EXCEDRIN MIGRAINE) per tablet 2 tablet  2 tablet Oral Q6H PRN Encarnacion Slates, NP   2 tablet at 06/03/13 1316  . chlordiazePOXIDE (LIBRIUM) capsule 25 mg  25 mg Oral Q6H PRN Encarnacion Slates, NP   25 mg at 06/03/13 1213  . chlordiazePOXIDE (LIBRIUM) capsule 25 mg  25 mg Oral BH-qamhs Encarnacion Slates, NP   25 mg at 06/03/13 0813   Followed by  . [START ON 06/04/2013] chlordiazePOXIDE (LIBRIUM) capsule 25 mg  25 mg Oral Daily Encarnacion Slates, NP      . feeding supplement (ENSURE COMPLETE) (ENSURE COMPLETE) liquid 237 mL  237 mL Oral BID BM Darrol Jump, RD   237 mL at 06/03/13 1439  . gabapentin (NEURONTIN) capsule 100 mg  100 mg Oral TID Encarnacion Slates, NP   100 mg at 06/03/13 1159  . hydrOXYzine (ATARAX/VISTARIL) tablet 25 mg  25 mg Oral Q6H PRN Encarnacion Slates, NP   25 mg at 06/02/13 2110  . ibuprofen (ADVIL,MOTRIN) tablet 600 mg  600 mg Oral Q8H PRN Merian Capron, MD      . influenza vac split quadrivalent PF (FLUARIX) injection 0.5 mL  0.5 mL Intramuscular Tomorrow-1000 Nicholaus Bloom, MD      . loperamide (IMODIUM) capsule 2-4 mg  2-4 mg Oral PRN Encarnacion Slates, NP      . magnesium hydroxide (MILK OF MAGNESIA) suspension 30 mL  30 mL Oral Daily PRN Merian Capron, MD      . multivitamin with minerals tablet 1 tablet  1 tablet Oral Daily Encarnacion Slates, NP   1 tablet at 06/03/13 [phone]  . nicotine (NICODERM CQ - dosed in mg/24 hours) patch 21 mg  21 mg Transdermal Daily Encarnacion Slates, NP   21 mg at 06/03/13 0814  . ondansetron (ZOFRAN-ODT) disintegrating tablet 4 mg  4 mg Oral Q6H PRN Encarnacion Slates, NP   4 mg at 06/02/13 0736  . pneumococcal 23 valent vaccine (PNU-IMMUNE) injection 0.5 mL  0.5 mL Intramuscular Tomorrow-1000 Nicholaus Bloom, MD      . senna-docusate (Senokot-S) tablet 1 tablet  1 tablet Oral QHS Encarnacion Slates, NP   1 tablet at 06/02/13 2111  . sertraline (ZOLOFT) tablet 100 mg  100 mg Oral Daily Encarnacion Slates, NP   100 mg at 06/03/13 8299  . thiamine (VITAMIN B-1) tablet 100 mg  100 mg Oral Daily Encarnacion Slates, NP   100 mg at 06/03/13 3716  . traZODone (DESYREL) tablet 100 mg  100 mg Oral QHS PRN Encarnacion Slates, NP   100 mg at 06/02/13 2110  . zolpidem (AMBIEN) tablet 5 mg  5 mg Oral QHS PRN Merian Capron, MD   5 mg at 06/02/13 2110    Lab Results: No results found for this or  any previous visit (from the past 48 hour(s)).  Physical Findings: AIMS: Facial and Oral Movements Muscles of Facial Expression: None, normal Lips and Perioral Area: None, normal Jaw: None, normal Tongue: None, normal,Extremity Movements Upper (arms, wrists, hands, fingers): None, normal Lower (legs, knees, ankles, toes): None, normal, Trunk Movements Neck, shoulders, hips: None, normal, Overall Severity Severity of abnormal movements (highest score from questions above): None, normal Incapacitation due to abnormal movements: None, normal Patient's awareness of abnormal movements (rate only patient's report): No Awareness, Dental Status Current problems with teeth and/or dentures?: No Does patient usually wear dentures?: No  CIWA:  CIWA-Ar Total: 9 COWS:  COWS Total Score: 4  Treatment Plan Summary: Daily contact with patient to assess and evaluate symptoms and progress in treatment Medication management  Plan: 1. Continue crisis management and stabilization.  2. Medication management: Continue Librium detox, Initiate Tegretol XR 200 mg bid for mood stabilization, Risperdal 1 mg Q bedtime for mood control, Continue Sertraline 100 mg daily for depression, Trazodone 100 mg Q bedtime for sleep and Ambien 5 mg Q bedtime for sleep. Obtain Tegretol levels. 3. Encouraged patient to attend groups and participate in group counseling sessions and activities.  4. Discharge plan in progress.  5. Continue current treatment plan.  6. Address health issues: Vitals reviewed and stable.   Medical Decision Making Problem Points:  Established problem, worsening (2), Review of last therapy session (1) and Review of psycho-social stressors (1) Data Points:  Review of medication regiment & side effects (2) Review of new medications or change in dosage (2)  I certify that inpatient services furnished can reasonably be expected to improve the patient's condition.   Encarnacion Slates, PMHNP-BC 06/03/2013,  4:21 PM  Reviewed the information documented and agree with the treatment plan.  Dorismar Chay,JANARDHAHA R. 06/04/2013 9:30 AM

## 2013-06-03 NOTE — BHH Group Notes (Signed)
[facility] LCSW Group Therapy  06/03/2013 3:36 PM  Type of Therapy:  Group Therapy  Participation Level:  Active  Participation Quality:  Attentive  Affect:  Appropriate  Cognitive:  Alert and Oriented  Insight:  Engaged  Engagement in Therapy:  Improving  Modes of Intervention:  Confrontation, Discussion, Education, Exploration, Problem-solving, Rapport Building, Socialization and Support  Summary of Progress/Problems: Today's Topic: Overcoming Obstacles. Pt identified obstacles faced currently and processed barriers involved in overcoming these obstacles. Pt identified steps necessary for overcoming these obstacles and explored motivation (internal and external) for facing these difficulties head on. Pt further identified one area of concern in their lives and chose a skill of focus pulled from their "toolbox." Gina Shelton was attentive and engaged throughout today's therapy group. She shared that her biggest obstacle involves mending the relationships that she has with her children. Gina Shelton became tearful as she talked about how her substance abuse has impacted her children. She shows progress in the group setting and improving insight AEB her ability to process how having open, honest conversations with her kids, going to family counseling, and taking steps to avoid future relapses can help her overcome this obstacle and heal her relationships with the kids.    Smart, Gina Shelton LCSWA  06/03/2013, 3:36 PM

## 2013-06-03 NOTE — Progress Notes (Signed)
Adult Psychoeducational Group Note  Date:  06/03/2013 Time:  11:18 AM  Group Topic/Focus:  Wellness Toolbox:   The focus of this group is to discuss various aspects of wellness, balancing those aspects and exploring ways to increase the ability to experience wellness.  Patients will create a wellness toolbox for use upon discharge.  Participation Level:  Did Not Attend  Participation Quality:  NA  Affect:  NA  Cognitive:  NA  Insight: None  Engagement in Group:  NA  Modes of Intervention:  NA  Additional Comments:     Tretha Sciara 06/03/2013, 11:18 AM

## 2013-06-03 NOTE — Progress Notes (Signed)
Patient ID: Gina Shelton, female   DOB: September 18, 1975, 38 y.o.   MRN: [id number]  D: Patient pleasant on approach this am when I was speaking with her. Reports minimal withdrawal symptoms at present. Social worker stated she was irritable in group but I haven't had any experience with her being irritable with undersigned. Unknown when she will discharge. A: Staff will monitor on q 15 minute checks, follow treatment plan, and give medications as ordered. R: Cooperative on the unit

## 2013-06-03 NOTE — BHH Group Notes (Signed)
[facility] LCSW Aftercare Discharge Planning Group Note   06/03/2013 9:55 AM  Participation Quality:  Appropriate   Mood/Affect:  Irritable and Labile  Depression Rating:  9  Anxiety Rating:  10  Thoughts of Suicide:  No Will you contract for safety?   NA  Current AVH:  No  Plan for Discharge/Comments:  Pt reports that she is upset because there is no lemonade in the room and she refuses to drink coffee. Pt was redirectable by CSW but remained irritable throughout group. Pt states that she lives with her mother and three children in [place] and plans to return home at d/c. Pt plans to follow up at [facility] for med management and therapy. She reports no current providers. Severe withdrawals reported today-agitation is high.   Transportation Means: mother   Supports: Optometrist, Research officer, trade union
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
## 2013-06-03 NOTE — Progress Notes (Addendum)
NUTRITION ASSESSMENT  Pt identified as at risk on the Malnutrition Screen Tool  INTERVENTION: 1. Educated patient on the importance of nutrition and encouraged intake of food and beverages.   2. Provided handout "Sobriety Nutrition" 3. Supplements: MVI and thiamine daily.  Ensure Complete po BID, each supplement provides 350 kcal and 13 grams of protein   NUTRITION DIAGNOSIS: Unintentional weight loss related to sub-optimal intake as evidenced by pt report.   Goal: Pt to meet >/= 90% of their estimated nutrition needs.  Monitor:  PO intake  Assessment:  Patient admitted with major depression, etoh abuse and use of cocaine.  Patient reports a variable appetite currently but trying to eat.  Poor intake prior to admit and would only eat once per day secondary to ETOH abuse.  Reports UBW of 175 lbs 6 months ago.  Patient agreeable to Ensure.  Patient meets criteria for mild/moderate malnutrition related to social causes AEB 13% weight loss in the past 6 months and intake <75% for > than 3 months.  38 y.o. female  Height: Ht Readings from Last 1 Encounters:  06/01/13 5\' 5"  (1.651 m)    Weight: Wt Readings from Last 1 Encounters:  06/01/13 146 lb (66.225 kg)    Weight Hx: Wt Readings from Last 10 Encounters:  06/01/13 146 lb (66.225 kg)  08/04/12 172 lb (78.019 kg)  04/19/11 169 lb 6 oz (76.828 kg)  03/02/11 149 lb (67.586 kg)  02/25/10 165 lb 11.2 oz (75.161 kg)  01/29/10 167 lb (75.751 kg)  03/04/09 173 lb 6.4 oz (78.654 kg)  11/05/08 172 lb 12.8 oz (78.382 kg)  09/18/08 173 lb 14.4 oz (78.881 kg)  09/12/08 177 lb 8 oz (80.513 kg)    BMI:  Body mass index is 24.3 kg/(m^2). Pt meets criteria for normal weight based on current BMI.  Estimated Nutritional Needs: Kcal: 25-30 kcal/kg Protein: > 1 gram protein/kg Fluid: 1 ml/kcal  Diet Order: General Pt is also offered choice of unit snacks mid-morning and mid-afternoon.  Pt is eating as desired.   Lab results and  medications reviewed.   Antonieta Iba, RD, LDN Clinical Inpatient Dietitian Pager:  [phone] Weekend and after hours pager:  [phone]
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
## 2013-06-04 MED ORDER — METHOCARBAMOL 500 MG PO TABS: 500.0000 mg | ORAL_TABLET | Freq: Four times a day (QID) | ORAL | Status: DC | PRN

## 2013-06-04 MED ORDER — HYDROXYZINE HCL 25 MG PO TABS
25.0000 mg | ORAL_TABLET | Freq: Four times a day (QID) | ORAL | Status: AC | PRN
  Administered 2013-06-05: 25 mg via ORAL
  Filled 2013-06-04: qty 1

## 2013-06-04 MED ORDER — PNEUMOCOCCAL VAC POLYVALENT 25 MCG/0.5ML IJ INJ
0.5000 mL | INJECTION | INTRAMUSCULAR | Status: AC
  Administered 2013-06-05: 0.5 mL via INTRAMUSCULAR

## 2013-06-04 MED ORDER — METHOCARBAMOL 500 MG PO TABS
500.0000 mg | ORAL_TABLET | Freq: Four times a day (QID) | ORAL | Status: AC | PRN
  Administered 2013-06-04 – 2013-06-07 (×6): 500 mg via ORAL
  Filled 2013-06-04 (×6): qty 1

## 2013-06-04 MED ORDER — INFLUENZA VAC SPLIT QUAD 0.5 ML IM SUSP
0.5000 mL | INTRAMUSCULAR | Status: AC
  Administered 2013-06-05: 0.5 mL via INTRAMUSCULAR
  Filled 2013-06-04: qty 0.5

## 2013-06-04 NOTE — Progress Notes (Signed)
Recreation Therapy Notes  Animal-Assisted Activity/Therapy (AAA/T) Program Checklist/Progress Notes Patient Eligibility Criteria Checklist & Daily Group note for Rec Tx Intervention  Date: 03.24.2015 Time: 2:45pm Location: [address] Dayroom    AAA/T Program Assumption of Risk Form signed by Patient/ or Parent Legal Guardian yes  Patient is free of allergies or sever asthma yes  Patient reports no fear of animals yes  Patient reports no history of cruelty to animals yes   Patient understands his/her participation is voluntary yes  Patient washes hands before animal contact yes  Patient washes hands after animal contact yes  Behavioral Response: Engaged, Appropriate   Education: Hand Washing, Appropriate Animal Interaction   Education Outcome: Acknowledges understanding  Clinical Observations/Feedback: Patient actively engaged in session, petting therapy dog appropriately and observing peer interaction with therapy dog.   Marteze Vecchio L Miniya Miguez, LRT/CTRS  Alexsander Cavins L 06/04/2013 4:48 PM 

## 2013-06-04 NOTE — Progress Notes (Signed)
D:  Per pt self inventory pt reports sleeping with requested sleep medication, appetite poor, energy level normal, ability to pay attention good, rates depression at a 9 out of 10 and hopelessness at a 10 out of 10, denies SI/HI/AVH at this time, pt was flat/depressed during interaction, c/o neck/back spasms.      A:  NP notified of pt spasms, Emotional support provided, Encouraged pt to continue with treatment plan and attend all group activities, q15 min checks maintained for safety.  R:  Pt has been going to most groups, pleasant and cooperative with staff and other patients on the unit, pt sattes that she plans to follow up with family services after she is discharged.

## 2013-06-04 NOTE — BHH Suicide Risk Assessment (Addendum)
[place] INPATIENT:  Family/Significant Other Suicide Prevention Education  Suicide Prevention Education:  Contact Attempts: Mellissa Kohut (pt's mother) [phone] has been identified by the patient as the family member/significant other with whom the patient will be residing, and identified as the person(s) who will aid the patient in the event of a mental health crisis.  With written consent from the patient, two attempts were made to provide suicide prevention education, prior to and/or following the patient's discharge.  We were unsuccessful in providing suicide prevention education.  A suicide education pamphlet was given to the patient to share with family/significant other.  Date and time of first attempt: 06/03/13 2:00PM Date and time of second attempt:06/04/13 9:50AM (message left for [facility] on her personal voicemail)  Smart, [place] Melissa  06/04/2013, 9:52 AM

## 2013-06-04 NOTE — BHH Group Notes (Signed)
[place] LCSW Group Therapy  06/04/2013 3:16 PM  Type of Therapy:  Group Therapy  Participation Level:  Active  Participation Quality:  Attentive  Affect:  Tearful  Cognitive:  Alert and Oriented  Insight:  Engaged  Engagement in Therapy:  Engaged  Modes of Intervention:  Discussion, Education, Exploration, Problem-solving, Rapport Building, Socialization and Support  Summary of Progress/Problems: MHA Speaker came to talk about his personal journey with substance abuse and addiction. The pt processed ways by which to relate to the speaker. Kennett speaker provided handouts and educational information pertaining to groups and services offered by the [facility]. Taylinn was attentive and engaged throughout today's therapy group. She became tearful as the speaker shared his personal story regarding MI and SA. Kathelene followed along in pamphlet as the speaker reviewed various resources offered by the [facility].    Smart, Gema Ringold LCSWA  06/04/2013, 3:16 PM

## 2013-06-04 NOTE — Progress Notes (Signed)
Patient ID: Gina Shelton, female   DOB: [birth year], 38 y.o.   MRN: [id number] [facility] MD Progress Note  06/04/2013 11:18 AM Gina Shelton  MRN:  [id number]  Subjective: [name] reports, "I'm still having tremors, depression, high anxiety, back and neck spasms. She rated her depression at #9 and Anxiety at #10"  [name] is having a hard time. She appears restless, agitated, uncomfortable and anxious. However, she is able to participate in group sessions. She is taking her medications. Denies any adverse effects.  Diagnosis:   DSM5: Schizophrenia Disorders:  NA Obsessive-Compulsive Disorders:  NA Trauma-Stressor Disorders:  NA Substance/Addictive Disorders:  Alcohol Related Disorder - Severe (303.90) Depressive Disorders:  Major Depressive Disorder - Severe (296.23) Total Time spent with patient: 30 minutes  Axis I: Alcohol Related Disorder - Severe (303.90), Major depressive disorder Axis II: Deferred Axis III:  Past Medical History  Diagnosis Date  . Depression   . GERD (gastroesophageal reflux disease)   . Migraine   . Gout     ble  . Depression   . Migraine   . Anxiety    Axis IV: other psychosocial or environmental problems and Polysubstance dependence Axis V: Serious symptoms  ADL's:  Dischaeveled  Sleep: Fair  Appetite:  Fair  Suicidal Ideation:  Plan:  Denies Intent:  Denies Means:  Denies Homicidal Ideation:  Plan:  Denies Intent:  Denies Means:  Denies AEB (as evidenced by):  Psychiatric Specialty Exam: Physical Exam  Psychiatric: Her speech is normal. Her mood appears anxious. Cognition and memory are normal. She exhibits a depressed mood.    Review of Systems  Constitutional: Positive for chills, malaise/fatigue and diaphoresis.  HENT: Negative.   Eyes: Negative.   Respiratory: Negative.   Cardiovascular: Negative.   Gastrointestinal: Negative.   Genitourinary: Negative.   Musculoskeletal: Negative.   Skin: Negative.   Neurological:  Positive for tremors and weakness.  Endo/Heme/Allergies: Negative.   Psychiatric/Behavioral: Positive for depression and substance abuse. Negative for suicidal ideas, hallucinations and memory loss. The patient is nervous/anxious and has insomnia.     Blood pressure 103/73, pulse 90, temperature 97.5 F (36.4 C), temperature source Oral, resp. rate 18, height 5\' 5"  (1.651 m), weight 66.225 kg (146 lb), last menstrual period 05/29/2013, SpO2 100.00%.Body mass index is 24.3 kg/(m^2).  General Appearance: Disheveled  Eye Contact::  Poor  Speech:  Clear and Coherent  Volume:  Increased  Mood:  Anxious, Depressed, Hopeless and Irritable  Affect:  Congruent  Thought Process:  Coherent  Orientation:  Full (Time, Place, and Person)  Thought Content:  Rumination  Suicidal Thoughts:  No  Homicidal Thoughts:  No  Memory:  Immediate;   Good Recent;   Good Remote;   Good  Judgement:  Fair  Insight:  Present  Psychomotor Activity:  Restlessness and Tremor  Concentration:  Poor  Recall:  Good  Fund of Knowledge:Fair  Language: Good  Akathisia:  No  Handed:  Right  AIMS (if indicated):     Assets:  Desire for Improvement  Sleep:  Number of Hours: 6.75   Musculoskeletal: Strength & Muscle Tone: within normal limits Gait & Station: normal Patient leans: N/A  Current Medications: Current Facility-Administered Medications  Medication Dose Route Frequency Provider Last Rate Last Dose  . acetaminophen (TYLENOL) tablet 650 mg  650 mg Oral Q4H PRN Merian Capron, MD   650 mg at 06/01/13 1315  . alum & mag hydroxide-simeth (MAALOX/MYLANTA) 200-200-20 MG/5ML suspension 30 mL  30 mL Oral Q4H  PRN Merian Capron, MD      . aspirin-acetaminophen-caffeine [facility] MIGRAINE) per tablet 2 tablet  2 tablet Oral Q6H PRN Encarnacion Slates, NP   2 tablet at 06/03/13 1316  . carbamazepine (TEGRETOL XR) 12 hr tablet 200 mg  200 mg Oral BID Encarnacion Slates, NP   200 mg at 06/04/13 0744  . chlordiazePOXIDE (LIBRIUM)  capsule 25 mg  25 mg Oral Q6H PRN Encarnacion Slates, NP   25 mg at 06/03/13 1213  . feeding supplement (ENSURE COMPLETE) (ENSURE COMPLETE) liquid 237 mL  237 mL Oral BID BM Darrol Jump, RD   237 mL at 06/04/13 0951  . gabapentin (NEURONTIN) capsule 100 mg  100 mg Oral TID Encarnacion Slates, NP   100 mg at 06/04/13 0744  . hydrOXYzine (ATARAX/VISTARIL) tablet 25 mg  25 mg Oral Q6H PRN Encarnacion Slates, NP   25 mg at 06/03/13 2106  . ibuprofen (ADVIL,MOTRIN) tablet 600 mg  600 mg Oral Q8H PRN Merian Capron, MD   600 mg at 06/04/13 0951  . influenza vac split quadrivalent PF (FLUARIX) injection 0.5 mL  0.5 mL Intramuscular Tomorrow-1000 Nicholaus Bloom, MD      . loperamide (IMODIUM) capsule 2-4 mg  2-4 mg Oral PRN Encarnacion Slates, NP      . magnesium hydroxide (MILK OF MAGNESIA) suspension 30 mL  30 mL Oral Daily PRN Merian Capron, MD   30 mL at 06/03/13 2106  . methocarbamol (ROBAXIN) tablet 500 mg  500 mg Oral Q6H PRN Encarnacion Slates, NP      . multivitamin with minerals tablet 1 tablet  1 tablet Oral Daily Encarnacion Slates, NP   1 tablet at 06/04/13 0744  . nicotine (NICODERM CQ - dosed in mg/24 hours) patch 21 mg  21 mg Transdermal Daily Encarnacion Slates, NP   21 mg at 06/04/13 0746  . ondansetron (ZOFRAN-ODT) disintegrating tablet 4 mg  4 mg Oral Q6H PRN Encarnacion Slates, NP   4 mg at 06/02/13 0736  . pneumococcal 23 valent vaccine (PNU-IMMUNE) injection 0.5 mL  0.5 mL Intramuscular Tomorrow-1000 Nicholaus Bloom, MD      . risperiDONE (RISPERDAL) tablet 1 mg  1 mg Oral QHS Encarnacion Slates, NP   1 mg at 06/03/13 2126  . senna-docusate (Senokot-S) tablet 1 tablet  1 tablet Oral QHS Encarnacion Slates, NP   1 tablet at 06/03/13 2107  . sertraline (ZOLOFT) tablet 100 mg  100 mg Oral Daily Encarnacion Slates, NP   100 mg at 06/04/13 0744  . thiamine (VITAMIN B-1) tablet 100 mg  100 mg Oral Daily Encarnacion Slates, NP   100 mg at 06/04/13 0744  . traZODone (DESYREL) tablet 100 mg  100 mg Oral QHS PRN Encarnacion Slates, NP   100 mg at 06/03/13  2106  . zolpidem (AMBIEN) tablet 5 mg  5 mg Oral QHS PRN Merian Capron, MD   5 mg at 06/02/13 2110    Lab Results: No results found for this or any previous visit (from the past 48 hour(s)).  Physical Findings: AIMS: Facial and Oral Movements Muscles of Facial Expression: None, normal Lips and Perioral Area: None, normal Jaw: None, normal Tongue: None, normal,Extremity Movements Upper (arms, wrists, hands, fingers): None, normal Lower (legs, knees, ankles, toes): None, normal, Trunk Movements Neck, shoulders, hips: None, normal, Overall Severity Severity of abnormal movements (highest score from questions above): None, normal Incapacitation due to abnormal  movements: None, normal Patient's awareness of abnormal movements (rate only patient's report): No Awareness, Dental Status Current problems with teeth and/or dentures?: No Does patient usually wear dentures?: No  CIWA:  CIWA-Ar Total: 4 COWS:  COWS Total Score: 4  Treatment Plan Summary: Daily contact with patient to assess and evaluate symptoms and progress in treatment Medication management  Plan: 1. Continue crisis management and stabilization.  2. Medication management: Continue Librium detox, Initiate Tegretol XR 200 mg bid for mood stabilization, Risperdal 1 mg Q bedtime for mood control, Continue Sertraline 100 mg daily for depression, Trazodone 100 mg Q bedtime for sleep and Ambien 5 mg Q bedtime for sleep. Add robaxin 500 mg for muscle pains, Hydroxyzine 25 mg Q 6 hours prn for anxiety. Obtain Tegretol levels. 3. Encouraged patient to attend groups and participate in group counseling sessions and activities.  4. Discharge plan in progress.  5. Continue current treatment plan.  6. Address health issues: Vitals reviewed and stable.   Medical Decision Making Problem Points:  Established problem, worsening (2), Review of last therapy session (1) and Review of psycho-social stressors (1) Data Points:  Review of medication  regiment & side effects (2) Review of new medications or change in dosage (2)  I certify that inpatient services furnished can reasonably be expected to improve the patient's condition.   Lindell Spar I, PMHNP-BC 06/04/2013, 11:18 AM   Patient seen, evaluated and I agree with notes by Nurse Practitioner. Corena Pilgrim, MD
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
## 2013-06-04 NOTE — Progress Notes (Signed)
Type of Therapy: Paint Therapy with [employer] Nursing Students Participation Level: Raelene did not attend the group
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
## 2013-06-05 MED ORDER — SERTRALINE HCL 50 MG PO TABS
150.0000 mg | ORAL_TABLET | Freq: Every day | ORAL | Status: DC
  Administered 2013-06-06 – 2013-06-07 (×2): 150 mg via ORAL
  Filled 2013-06-05 (×5): qty 1
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
## 2013-06-05 NOTE — Progress Notes (Signed)
Pt resting in bed, eyes closed, breathing even and unlabored. Pt remains safe on the unit. Q15 min safety checks maintained. 

## 2013-06-05 NOTE — BHH Group Notes (Signed)
[place] LCSW Group Therapy  06/05/2013 2:56 PM  Type of Therapy:  Group Therapy  Participation Level:  Active  Participation Quality:  Attentive  Affect:  Tearful  Cognitive:  Alert and Oriented  Insight:  Engaged  Engagement in Therapy:  Engaged  Modes of Intervention:  Confrontation, Discussion, Education, Exploration, Problem-solving, Rapport Building, Socialization and Support  Summary of Progress/Problems: Emotion Regulation: This group focused on both positive and negative emotion identification and allowed group members to process ways to identify feelings, regulate negative emotions, and find healthy ways to manage internal/external emotions. Group members were asked to reflect on a time when their reaction to an emotion led to a negative outcome and explored how alternative responses using emotion regulation would have benefited them. Group members were also asked to discuss a time when emotion regulation was utilized when a negative emotion was experienced. Doninique was attentive and engaged throughout today's therapy group. She stated taht she struggles with both depression and anger. Sanyla shared that alcohol makes her anger worse and her inability to regulate her negative emotions becomes even less. She shows progress in the group setting and improving insight AEB her ability to process how going to family counseling, remaining sober, and seeking individual counseling will help her learn better coping skills to help her with anger. Faithann shared that she has been put on different medication for mood stabilization that is assisting her with regulating depression and bipolar symptoms as well.    Smart, Jamille Fisher LCSWA  06/05/2013, 2:56 PM

## 2013-06-05 NOTE — BHH Group Notes (Signed)
[facility] LCSW Aftercare Discharge Planning Group Note   06/05/2013 10:44 AM  Participation Quality:  Appropriate   Mood/Affect:  Depressed and Flat  Depression Rating:  9  Anxiety Rating:  10  Thoughts of Suicide:  No Will you contract for safety?   NA  Current AVH:  No  Plan for Discharge/Comments:  Pt reports that she is still experiencing mood instability and reports changes to medication. Pt requesting two work notes, one note for disability office, and note for court date. Pt pleasant during group with flat affect.   Transportation Means: unknown at this time/bus?  Supports: few friends identified/mother.   Smart, [employer]

## 2013-06-05 NOTE — Progress Notes (Signed)
D:  Patient's self inventory sheet, patient has poor sleep, poor appetite, low energy level, improving attention span.  Rated depression and hopeless #7, anxiety #7.  Patient has experienced tremors and sweats, cravings.  Pain goal 8, worst pain 9.  Has experienced lightheadedness, pain, dizziness, headaches in past 24 hours.  Plans to stay away from drugs.  Take medications.  Thinks she needs stronger medication for anxiety/depression.  Does have discharge plans.  No problems taking meds after discharge. A:  Medications administered per MD orders.  Emotional support and encouragement given patient. R:  Denied SI and HI.  Denied A/V hallucinations this morning.  Stated she did see visions/shadows yesterday.  Will continue to monitor patient for safety with 15 minute checks.  Safety maintained.

## 2013-06-06 LAB — CARBAMAZEPINE LEVEL, TOTAL: Carbamazepine Lvl: 6.9 ug/mL (ref 4.0–12.0)

## 2013-06-06 MED ORDER — ONDANSETRON 4 MG PO TBDP
4.0000 mg | ORAL_TABLET | Freq: Four times a day (QID) | ORAL | Status: DC | PRN
  Administered 2013-06-06: 4 mg via ORAL
  Filled 2013-06-06: qty 1

## 2013-06-06 NOTE — BHH Group Notes (Signed)
[facility] LCSW Group Therapy  06/06/2013 3:10 PM  Type of Therapy:  Group Therapy  Participation Level:  Active  Participation Quality:  Attentive  Affect:  Appropriate  Cognitive:  Alert and Oriented  Insight:  Engaged  Engagement in Therapy:  Improving  Modes of Intervention:  Confrontation, Discussion, Education, Exploration, Problem-solving, Rapport Building, Socialization and Support  Summary of Progress/Problems: Group discussed balance in terms of different personality types such as passive, aggressive, and assertive. Dazha was attentive and engaged throughout today's therapy group. She discussed her aggressive personality and the negative consequences of some past experiences where she acted out aggressively without thinking through her actions. Addriene shows progress in the group setting and improving insight AEB her ability to process how "going to counseling, getting a sponsor to talk to, being more open with her kids, walking away from negative situations, and changing living environment" would help her to become less aggressive and more assertive. "it's all about balance."   Smart, Vannessa [place]own LCSWA  06/06/2013, 3:10 PM

## 2013-06-06 NOTE — Progress Notes (Signed)
Patient ID: Gina Shelton, female   DOB: 08/19/1975, 38 y.o.   MRN: [id number] D: Patient stated her day was better than yesterday. Pt denies withdrawal symptoms.  Pt states her appetite is improving. Pt endorses suicidal ideation without a plan and verbally contracts for safety. Pt denies HI/AVH.  Pt attended evening NA group and engaged in discussion. Pt denies any needs or concerns. Cooperative with assessment. No acute distressed noted at this time.   A: Met with pt 1:1. Medications administered as prescribed. Writer encouraged pt to discuss feelings. Pt encouraged to come to staff with any questions or concerns.   R: Patient is safe on the unit. She is complaint with medications and denies any adverse reaction. Continue current POC.

## 2013-06-06 NOTE — Progress Notes (Signed)
Pt has been up and active in the milieu today.Marland Kitchen She was tearful this morning and rated her depression an 8 and both hopelessness and anxiety a 9 on her self-inventory.  She denies any S/H ideation or A/V/H.  She is concerned about her court date and already knows she will be in jail for 30 days.

## 2013-06-06 NOTE — Progress Notes (Signed)
Recreation Therapy Notes  Animal-Assisted Activity/Therapy (AAA/T) Program Checklist/Progress Notes Patient Eligibility Criteria Checklist & Daily Group note for Rec Tx Intervention  Date: 03.26.2015 Time: 2:45pm Location: [address]    AAA/T Program Assumption of Risk Form signed by Patient/ or Parent Legal Guardian yes  Patient is free of allergies or sever asthma yes  Patient reports no fear of animals yes  Patient reports no history of cruelty to animals yes   Patient understands his/her participation is voluntary yes  Behavioral Response: DID NOT ATTEND.   Laureen Ochs Soraida Vickers, LRT/CTRS  Lane Hacker 06/06/2013 4:37 PM

## 2013-06-06 NOTE — Progress Notes (Signed)
Patient ID: Gina Shelton, female   DOB: 1975/12/13, 38 y.o.   MRN: [id number] Patient ID: Gina Shelton, female   DOB: [birth year], 38 y.o.   MRN: [id number] [facility] MD Progress Note  06/06/2013 5:17 PM Gina Shelton  MRN:  [id number]  Subjective:  Patient states "My mood is improving, but I feel very nauseated now"  Objective:  Patient is visible on the unit. She is attending groups. Patient endorses improved mood, fair appetite, low energy level. Rates her depression at 4 and anxiety at 5. She speaks about having a mood disorder and realizes that staying on medication is key for her. Patient does appear sad but expresses hope about starting a new life. She plans on finding a stable living environment and attending AA/NA meetings after discharge. 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 Depression   . GERD (gastroesophageal reflux disease)   . Migraine   . Gout     ble  . Depression   . Migraine   . Anxiety    Axis IV: other psychosocial or environmental problems and Polysubstance dependence Axis V: Serious symptoms  ADL's: Intact   Sleep: Good  Appetite:  Fair  Suicidal Ideation:  Plan:  Denies Intent:  Denies Means:  Denies Homicidal Ideation:  Plan:  Denies Intent:  Denies Means:  Denies AEB (as evidenced by):  Psychiatric Specialty Exam: Physical Exam  Psychiatric: Her speech is normal. Her mood appears anxious. Cognition and memory are normal. She  exhibits a depressed mood.    Review of Systems  Constitutional: Positive for malaise/fatigue.  HENT: Negative.   Eyes: Negative.   Respiratory: Negative.   Cardiovascular: Negative.   Gastrointestinal: Negative.   Genitourinary: Negative.   Musculoskeletal: Negative.   Skin: Negative.   Neurological: Positive for tremors and weakness.  Endo/Heme/Allergies: Negative.   Psychiatric/Behavioral: Positive for depression and substance abuse. Negative for suicidal ideas, hallucinations and memory loss. The patient is nervous/anxious and has insomnia.     Blood pressure 106/73, pulse 101, temperature 98.4 F (36.9 C), temperature source Oral, resp. rate 16, height 5\' 5"  (1.651 m), weight 66.225 kg (146 lb), last menstrual period 05/29/2013, SpO2 100.00%.Body mass index is 24.3 kg/(m^2).  General Appearance: Fairly Groomed  Engineer, water::  Poor  Speech:  Clear and Coherent  Volume:  Normal  Mood:  Anxious and Depressed  Affect:  Congruent  Thought Process:  Coherent  Orientation:  Full (Time, Place, and Person)  Thought Content:  Rumination  Suicidal Thoughts:  No  Homicidal Thoughts:  No  Memory:  Immediate;   Good Recent;   Good Remote;   Good  Judgement:  Fair  Insight:  Present  Psychomotor Activity:  Restlessness and Tremor  Concentration:  Poor  Recall:  Good  Fund of Knowledge:Fair  Language: Good  Akathisia:  No  Handed:  Right  AIMS (if indicated):     Assets:  Communication Skills Desire for Improvement Intimacy Leisure Time Physical Health Resilience  Sleep:  Number of Hours: 6.25  Musculoskeletal: Strength & Muscle Tone: within normal limits Gait & Station: normal Patient leans: N/A  Current Medications: Current Facility-Administered Medications  Medication Dose Route Frequency Provider Last Rate Last Dose  . acetaminophen (TYLENOL) tablet 650 mg  650 mg Oral Q4H PRN Merian Capron, MD   650 mg at 06/01/13 1315  . alum & mag hydroxide-simeth  (MAALOX/MYLANTA) 200-200-20 MG/5ML suspension 30 mL  30 mL Oral Q4H PRN Merian Capron, MD      . aspirin-acetaminophen-caffeine (EXCEDRIN MIGRAINE) per tablet 2 tablet  2 tablet Oral Q6H PRN Encarnacion Slates, NP   2 tablet at 06/06/13 [phone]  . carbamazepine (TEGRETOL XR) 12 hr tablet 200 mg  200 mg Oral BID Encarnacion Slates, NP   200 mg at 06/06/13 1652  . feeding supplement (ENSURE COMPLETE) (ENSURE COMPLETE) liquid 237 mL  237 mL Oral BID BM Darrol Jump, RD   237 mL at 06/06/13 1424  . gabapentin (NEURONTIN) capsule 100 mg  100 mg Oral TID Encarnacion Slates, NP   100 mg at 06/06/13 1652  . hydrOXYzine (ATARAX/VISTARIL) tablet 25 mg  25 mg Oral Q6H PRN Encarnacion Slates, NP   25 mg at 06/05/13 2353  . ibuprofen (ADVIL,MOTRIN) tablet 600 mg  600 mg Oral Q8H PRN Merian Capron, MD   600 mg at 06/04/13 0951  . influenza vac split quadrivalent PF (FLUARIX) injection 0.5 mL  0.5 mL Intramuscular Tomorrow-1000 Nicholaus Bloom, MD      . magnesium hydroxide (MILK OF MAGNESIA) suspension 30 mL  30 mL Oral Daily PRN Merian Capron, MD   30 mL at 06/03/13 2106  . methocarbamol (ROBAXIN) tablet 500 mg  500 mg Oral Q6H PRN Encarnacion Slates, NP   500 mg at 06/06/13 6144  . multivitamin with minerals tablet 1 tablet  1 tablet Oral Daily Encarnacion Slates, NP   1 tablet at 06/06/13 0758  . nicotine (NICODERM CQ - dosed in mg/24 hours) patch 21 mg  21 mg Transdermal Daily Encarnacion Slates, NP   21 mg at 06/06/13 0800  . ondansetron (ZOFRAN-ODT) disintegrating tablet 4 mg  4 mg Oral Q6H PRN Encarnacion Slates, NP      . pneumococcal 23 valent vaccine (PNU-IMMUNE) injection 0.5 mL  0.5 mL Intramuscular Tomorrow-1000 Nicholaus Bloom, MD      . risperiDONE (RISPERDAL) tablet 1 mg  1 mg Oral QHS Encarnacion Slates, NP   1 mg at 06/05/13 2140  . senna-docusate (Senokot-S) tablet 1 tablet  1 tablet Oral QHS Encarnacion Slates, NP   1 tablet at 06/05/13 2143  . sertraline (ZOLOFT) tablet 150 mg  150 mg Oral Daily Elmarie Shiley, NP   150 mg at 06/06/13 0759  .  thiamine (VITAMIN B-1) tablet 100 mg  100 mg Oral Daily Encarnacion Slates, NP   100 mg at 06/06/13 0800  . traZODone (DESYREL) tablet 100 mg  100 mg Oral QHS PRN Encarnacion Slates, NP   100 mg at 06/04/13 2117  . zolpidem (AMBIEN) tablet 5 mg  5 mg Oral QHS PRN Merian Capron, MD   5 mg at 06/05/13 2141    Lab Results:  Results for orders placed during the hospital encounter of 06/01/13 (from the past 48 hour(s))  CARBAMAZEPINE LEVEL, TOTAL     Status: None   Collection Time    06/06/13  6:20 AM      Result Value Ref Range   Carbamazepine Lvl 6.9  4.0 -  12.0 ug/mL   Comment: Performed at [facility]    Physical Findings: AIMS: Facial and Oral Movements Muscles of Facial Expression: None, normal Lips and Perioral Area: None, normal Jaw: None, normal Tongue: None, normal,Extremity Movements Upper (arms, wrists, hands, fingers): None, normal Lower (legs, knees, ankles, toes): None, normal, Trunk Movements Neck, shoulders, hips: None, normal, Overall Severity Severity of abnormal movements (highest score from questions above): None, normal Incapacitation due to abnormal movements: None, normal Patient's awareness of abnormal movements (rate only patient's report): No Awareness, Dental Status Current problems with teeth and/or dentures?: No Does patient usually wear dentures?: No  CIWA:  CIWA-Ar Total: 2 COWS:  COWS Total Score: 3  Treatment Plan Summary: Daily contact with patient to assess and evaluate symptoms and progress in treatment Medication management  Plan: 1. Continue crisis management and stabilization.  2. Medication management: Continue Tegretol XR 200 mg bid for mood stabilization, Risperdal 1 mg Q bedtime for mood control, Increase Sertraline 150 mg daily for depression, Trazodone 100 mg Q bedtime for sleep and Ambien 5 mg Q bedtime for sleep, Robaxin 500 mg for muscle pains, Hydroxyzine 25 mg Q 6 hours prn for anxiety. 3. Encouraged patient to attend groups and  participate in group counseling sessions and activities.  4. Discharge plan in progress.  5. Continue current treatment plan.  6. Address health issues: Vitals reviewed and stable.   Medical Decision Making Problem Points:  Established problem, worsening (2), Review of last therapy session (1) and Review of psycho-social stressors (1) Data Points:  Review of medication regiment & side effects (2) Review of new medications or change in dosage (2)  I certify that inpatient services furnished can reasonably be expected to improve the patient's condition.   Lindell Spar I, NP-C 06/06/2013, 5:17 PM   Patient seen, evaluated and I agree with notes by Nurse Practitioner. Corena Pilgrim, MD

## 2013-06-06 NOTE — Progress Notes (Signed)
0900 orientation group    The focus of this group is to educate the patient on the purpose and policies of crisis stabilization and provide a format to answer questions about their admission.  The group details unit policies and expectations of patients while admitted.   Pt was an active participant appropriate and cooperative.

## 2013-06-06 NOTE — Progress Notes (Signed)
Patient did attend the evening karaoke group. Pt was engaged and supportive. Pt also helped comfort a pt who started crying at the end of group by staying by her side.

## 2013-06-06 NOTE — Progress Notes (Signed)
D. Pt has been up and visible in milieu throughout the day today. Pt has complained of headaches, diarrhea and nausea this evening and requested and received PRN medication to help. Pt does state that she has been feeling better in regards to not having any tremors or sweating. Pt still endorsing depression and does appear flat, also complains of feeling anxious. Pt has received all medications without incident and denies SI. A. Support and encouragement provided, medication education given. R. Pt verbalized understanding, will continue to monitor.

## 2013-06-07 DIAGNOSIS — F411 Generalized anxiety disorder: Secondary | ICD-10-CM

## 2013-06-07 DIAGNOSIS — F1410 Cocaine abuse, uncomplicated: Secondary | ICD-10-CM | POA: Diagnosis present

## 2013-06-07 DIAGNOSIS — F1020 Alcohol dependence, uncomplicated: Secondary | ICD-10-CM | POA: Diagnosis present

## 2013-06-07 MED ORDER — METHOCARBAMOL 500 MG PO TABS
500.0000 mg | ORAL_TABLET | Freq: Four times a day (QID) | ORAL | Status: DC | PRN
  Administered 2013-06-07 – 2013-06-08 (×2): 500 mg via ORAL
  Filled 2013-06-07 (×2): qty 10
  Filled 2013-06-07: qty 1

## 2013-06-07 MED ORDER — CARBAMAZEPINE ER 200 MG PO TB12: 200.0000 mg | ORAL_TABLET | Freq: Two times a day (BID) | ORAL | Status: DC

## 2013-06-07 MED ORDER — RISPERIDONE 1 MG PO TABS: 1.0000 mg | ORAL_TABLET | Freq: Every day | ORAL | Status: DC

## 2013-06-07 MED ORDER — TRAZODONE HCL 100 MG PO TABS: 100.0000 mg | ORAL_TABLET | Freq: Every evening | ORAL | Status: DC | PRN

## 2013-06-07 MED ORDER — GABAPENTIN 100 MG PO CAPS: 100.0000 mg | ORAL_CAPSULE | Freq: Three times a day (TID) | ORAL | Status: DC

## 2013-06-07 MED ORDER — SERTRALINE HCL 50 MG PO TABS
150.0000 mg | ORAL_TABLET | Freq: Every day | ORAL | Status: DC
  Administered 2013-06-08: 150 mg via ORAL
  Filled 2013-06-07 (×2): qty 9

## 2013-06-07 MED ORDER — METHOCARBAMOL 500 MG PO TABS
ORAL_TABLET | ORAL | Status: AC
  Filled 2013-06-07: qty 1

## 2013-06-07 MED ORDER — BUPROPION HCL ER (XL) 150 MG PO TB24
150.0000 mg | ORAL_TABLET | Freq: Every day | ORAL | Status: DC
  Administered 2013-06-07 – 2013-06-08 (×2): 150 mg via ORAL
  Filled 2013-06-07: qty 1
  Filled 2013-06-07: qty 4
  Filled 2013-06-07 (×2): qty 1

## 2013-06-07 MED ORDER — SERTRALINE HCL 50 MG PO TABS: 150.0000 mg | ORAL_TABLET | Freq: Every day | ORAL | Status: DC

## 2013-06-07 MED ORDER — HYDROXYZINE HCL 25 MG PO TABS: 25.0000 mg | ORAL_TABLET | Freq: Four times a day (QID) | ORAL | Status: DC | PRN

## 2013-06-07 NOTE — Progress Notes (Addendum)
[facility] Adult Case Management Discharge Plan :  Will you be returning to the same living situation after discharge: Yes,  home At discharge, do you have transportation home?:Yes, pt friend or mother picking her up Saturday Do you have the ability to pay for your medications:Yes,  [name]  Release of information consent forms completed and submitted to Medical Records by CSW.  Patient to Follow up at: Follow-up Information   Follow up with [facility]. (Walk in between 8am-12pm Monday through Friday for medication management/hospital followup/assessment for therapy sessions/to get set up for group therapy if interested. )    Contact information:   [address], [place] [address] Phone: [phone] Fax: [phone]      Follow up with [facility]. (Call Herb Streater at discharge [phone]) in order to work out assessment time/date to get set up for services. )    Contact information:   [address] [place], [place] [address] Phone: [phone] Fax: none (please mail documents)      Patient denies SI/HI:   Yes,  during group/self report.     Safety Planning and Suicide Prevention discussed:  Yes,  Contact attempts made for pt's mother. (voicemails left). SPE completed with pt. SPI pamphlet provided to pt and she was encouraged to share information with support network, ask questions, and talk about any concerns relating to SPE.  Smart, Abdul Beirne LCSWA  06/07/2013, 10:17 AM

## 2013-06-07 NOTE — Progress Notes (Signed)
Adult Psychoeducational Group Note  Date:  06/07/2013 Time:  11:42 AM  Group Topic/Focus:  Relapse Prevention Planning:   The focus of this group is to define relapse and discuss the need for planning to combat relapse.  Participation Level:  Active  Participation Quality:  Appropriate, Sharing and Supportive  Affect:  Appropriate  Cognitive:  Alert and Appropriate  Insight: Appropriate  Engagement in Group:  Engaged and Supportive  Modes of Intervention:  Support  Additional Comments:    Dorothe Pea 06/07/2013, 11:42 AM

## 2013-06-07 NOTE — BHH Group Notes (Signed)
[facility] LCSW Aftercare Discharge Planning Group Note   06/07/2013 10:02 AM  Participation Quality:  Appropriate   Mood/Affect:  Appropriate  Depression Rating:  5  Anxiety Rating:  6  Thoughts of Suicide:  No Will you contract for safety?   NA  Current AVH:  No  Plan for Discharge/Comments:  Pt plans to return to work on Monday and follow up at [facility] for med management, individual therapy, and groups. Pt plans to eventually got to [facility] and was given info to [facility] (number/Jeff's contact info) in order to schedule screening after her April 15 court date.   Transportation Means: mom   Supports: mother/kids   Proofreader, Research officer, trade union

## 2013-06-07 NOTE — Progress Notes (Signed)
D) Pt was at the medication window and began talking about being discharged. Began crying. In a 1:1 Pt stated that she was indeed "jonesing" and was going to go out to drink. Pt stated that she didn't want to drink, but she was being pushed by her body. Tearful, sad, and verbalizing feeling overwhelmed about her situation and circumstances. A) Encouraged Pt to talk with Dr. Lugo and share her concerns with him. Given support, reassurance and praise. Processed with Pt what she felt would be best for her.  R) Pt will stay until tomorrow to give her another day to be able to process and work with her issues of dependency of alcohol and cocaine. 

## 2013-06-07 NOTE — Tx Team (Signed)
Interdisciplinary Treatment Plan Update (Adult)  Date: 06/07/2013   Time Reviewed: 10:13 AM  Progress in Treatment:  Attending groups: Yes  Participating in groups:  Yes  Taking medication as prescribed: Yes  Tolerating medication: Yes  Family/Significant othe contact made: Contact attempts made to reach pt's mother. SPE completed with pt.  Patient understands diagnosis: Yes, AEB seeking treatment for ETOH detox, cocaine abuse, mood stabilization, SI, and medication management.  Discussing patient identified problems/goals with staff: Yes  Medical problems stabilized or resolved: Yes  Denies suicidal/homicidal ideation: Yes during group, self report.  Patient has not harmed self or Others: Yes  New problem(s) identified:  Discharge Plan or Barriers: Pt plans to return home and follow up at [facility] for med management, group, and therapy. She also plans to follow up with Mr. Julious Oka from [employer] and was given info to [place] plans to call for screening/admission date after her April 15th court date.  Additional comments: Reason for Continuation of Hospitalization: d/c today   Estimated length of stay: d/c today  For review of initial/current patient goals, please see plan of care.  Attendees:  Patient:    Family:    Physician:  Dr. Sabra Heck MD. 06/07/2013   10:14 AM   Nursing: Gerald Stabs RN  06/07/2013 10:13 AM   Clinical Social Worker [place], [place]  06/07/2013 10:13 AM   Other: Oletta Darter RN  06/07/2013 10:13 AM   Other:    Other: [place] Coordination 06/07/2013 10:14 AM   Other:    Scribe for Treatment Team:  [place] LCSWA 06/07/2013 10:13 AM
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
## 2013-06-07 NOTE — BHH Group Notes (Signed)
[place] LCSW Group Therapy  06/07/2013 2:55 PM  Type of Therapy:  Group Therapy  Participation Level:  Active  Participation Quality:  Attentive and Sharing  Affect:  Appropriate and Tearful  Cognitive:  Alert and Oriented  Insight:  Engaged  Engagement in Therapy:  Engaged  Modes of Intervention:  Confrontation, Discussion, Education, Exploration, Problem-solving, Rapport Building, Socialization and Support  Summary of Progress/Problems: Feelings around Relapse. Group members discussed the meaning of relapse and shared personal stories of relapse, how it affected them and others, and how they perceived themselves during this time. Group members were encouraged to identify triggers, warning signs and coping skills used when facing the possibility of relapse. Social supports were discussed and explored in detail. Post Acute Withdrawal Syndrome (handout provided) was introduced and examined. Pt's were encouraged to ask questions, talk about key points associated with PAWS, and process this information in terms of relapse prevention. Gina Shelton was attentive and engaged throughout today's therapy group. She became tearful when talking about the guilt surrounding her substance abuse and the effect that this has had on her children. Gina Shelton shows progress in the group setting and improving insight AEB her ability to process how going to [facility] for therapy and meds, and eventually going to [facility] after getting court dates taken care of will help her avoid relapse and be successful in recovery.    Smart, [name] LCSWA  06/07/2013, 2:55 PM

## 2013-06-07 NOTE — BHH Suicide Risk Assessment (Deleted)
D) Pt was at the medication window and began talking about being discharged. Began crying. In a 1:1 Pt stated that she was indeed "jonesing" and was going to go out to drink. Pt stated that she didn't want to drink, but she was being pushed by her body. Tearful, sad, and verbalizing feeling overwhelmed about her situation and circumstances. A) Encouraged Pt to talk with Dr. Sabra Heck and share her concerns with him. Given support, reassurance and praise. Processed with Pt what she felt would be best for her.  R) Pt will stay until tomorrow to give her another day to be able to process and work with her issues of dependency of alcohol and cocaine.

## 2013-06-08 DIAGNOSIS — F1010 Alcohol abuse, uncomplicated: Secondary | ICD-10-CM

## 2013-06-08 DIAGNOSIS — F332 Major depressive disorder, recurrent severe without psychotic features: Secondary | ICD-10-CM

## 2013-06-08 DIAGNOSIS — F1994 Other psychoactive substance use, unspecified with psychoactive substance-induced mood disorder: Secondary | ICD-10-CM

## 2013-06-08 DIAGNOSIS — F1910 Other psychoactive substance abuse, uncomplicated: Secondary | ICD-10-CM

## 2013-06-08 MED ORDER — CARBAMAZEPINE ER 200 MG PO TB12: 200.0000 mg | ORAL_TABLET | Freq: Two times a day (BID) | ORAL | Status: DC

## 2013-06-08 MED ORDER — ENSURE COMPLETE PO LIQD: 237.0000 mL | Freq: Two times a day (BID) | ORAL | Status: DC

## 2013-06-08 MED ORDER — SERTRALINE HCL 50 MG PO TABS: 150.0000 mg | ORAL_TABLET | Freq: Every day | ORAL | Status: DC

## 2013-06-08 MED ORDER — METHOCARBAMOL 500 MG PO TABS: 500.0000 mg | ORAL_TABLET | Freq: Four times a day (QID) | ORAL | Status: DC | PRN

## 2013-06-08 MED ORDER — BUPROPION HCL ER (XL) 150 MG PO TB24: 150.0000 mg | ORAL_TABLET | Freq: Every day | ORAL | Status: DC

## 2013-06-08 MED ORDER — TRAZODONE HCL 100 MG PO TABS: 100.0000 mg | ORAL_TABLET | Freq: Every evening | ORAL | Status: DC | PRN

## 2013-06-08 MED ORDER — GABAPENTIN 100 MG PO CAPS: 100.0000 mg | ORAL_CAPSULE | Freq: Three times a day (TID) | ORAL | Status: DC

## 2013-06-08 MED ORDER — RISPERIDONE 1 MG PO TABS: 1.0000 mg | ORAL_TABLET | Freq: Every day | ORAL | Status: DC
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
## 2013-06-08 NOTE — Progress Notes (Signed)
Patient ID: Gina Shelton, female   DOB: 06-02-1975, 38 y.o.   MRN: [id number]  D: Pt has been appropriate on the unit today, she attended all groups and engaged in treatment. Pt reported that she was ready for discharge and that she was going to take one day at a time. Pt reported that she was hopeful that she was going to be successful with her recovery. Pt reported being negative SI/HI, no AH/VH noted. A: 15 min checks continued for patient safety. R: Pt safety maintained.

## 2013-06-08 NOTE — Progress Notes (Signed)
Patient ID: Gina Shelton, female   DOB: 1975-05-02, 38 y.o.   MRN: [id number] D)  Has been in the dayroom this evening,  Had visitors earlier, attended [employer] meeting, and has been compliant with meds and programming.  Stayed in dayroom for short time before getting her hs meds and going to bed. A)  Will continue to monitor for safety, continue  POC R)  Safety maintained.

## 2013-06-08 NOTE — Discharge Instructions (Signed)
Alcohol Problems °Most adults who drink alcohol drink in moderation (not a lot) are at low risk for developing problems related to their drinking. However, all drinkers, including low-risk drinkers, should know about the health risks connected with drinking alcohol. °RECOMMENDATIONS FOR LOW-RISK DRINKING  °Drink in moderation. Moderate drinking is defined as follows:  °· Men - no more than 2 drinks per day. °· Nonpregnant women - no more than 1 drink per day. °· Over age 65 - no more than 1 drink per day. °A standard drink is 12 grams of pure alcohol, which is equal to a 12 ounce bottle of beer or wine cooler, a 5 ounce glass of wine, or 1.5 ounces of distilled spirits (such as whiskey, brandy, vodka, or rum).  °ABSTAIN FROM (DO NOT DRINK) ALCOHOL: °· When pregnant or considering pregnancy. °· When taking a medication that interacts with alcohol. °· If you are alcohol dependent. °· A medical condition that prohibits drinking alcohol (such as ulcer, liver disease, or heart disease). °DISCUSS WITH YOUR CAREGIVER: °· If you are at risk for coronary heart disease, discuss the potential benefits and risks of alcohol use: Light to moderate drinking is associated with lower rates of coronary heart disease in certain populations (for example, men over age 45 and postmenopausal women). Infrequent or nondrinkers are advised not to begin light to moderate drinking to reduce the risk of coronary heart disease so as to avoid creating an alcohol-related problem. Similar protective effects can likely be gained through proper diet and exercise. °· Women and the elderly have smaller amounts of body water than men. As a result women and the elderly achieve a higher blood alcohol concentration after drinking the same amount of alcohol. °· Exposing a fetus to alcohol can cause a broad range of birth defects referred to as Fetal Alcohol Syndrome (FAS) or Alcohol-Related Birth Defects (ARBD). Although FAS/ARBD is connected with excessive  alcohol consumption during pregnancy, studies also have reported neurobehavioral problems in infants born to mothers reporting drinking an average of 1 drink per day during pregnancy. °· Heavier drinking (the consumption of more than 4 drinks per occasion by men and more than 3 drinks per occasion by women) impairs learning (cognitive) and psychomotor functions and increases the risk of alcohol-related problems, including accidents and injuries. °CAGE QUESTIONS:  °· Have you ever felt that you should Cut down on your drinking? °· Have people Annoyed you by criticizing your drinking? °· Have you ever felt bad or Guilty about your drinking? °· Have you ever had a drink first thing in the morning to steady your nerves or get rid of a hangover (Eye opener)? °If you answered positively to any of these questions: You may be at risk for alcohol-related problems if alcohol consumption is:  °· Men: Greater than 14 drinks per week or more than 4 drinks per occasion. °· Women: Greater than 7 drinks per week or more than 3 drinks per occasion. °Do you or your family have a medical history of alcohol-related problems, such as: °· Blackouts. °· Sexual dysfunction. °· Depression. °· Trauma. °· Liver dysfunction. °· Sleep disorders. °· Hypertension. °· Chronic abdominal pain. °· Has your drinking ever caused you problems, such as problems with your family, problems with your work (or school) performance, or accidents/injuries? °· Do you have a compulsion to drink or a preoccupation with drinking? °· Do you have poor control or are you unable to stop drinking once you have started? °· Do you have to drink to   avoid withdrawal symptoms? °· Do you have problems with withdrawal such as tremors, nausea, sweats, or mood disturbances? °· Does it take more alcohol than in the past to get you high? °· Do you feel a strong urge to drink? °· Do you change your plans so that you can have a drink? °· Do you ever drink in the morning to relieve  the shakes or a hangover? °If you have answered a number of the previous questions positively, it may be time for you to talk to your caregivers, family, and friends and see if they think you have a problem. Alcoholism is a chemical dependency that keeps getting worse and will eventually destroy your health and relationships. Many alcoholics end up dead, impoverished, or in prison. This is often the end result of all chemical dependency. °· Do not be discouraged if you are not ready to take action immediately. °· Decisions to change behavior often involve up and down desires to change and feeling like you cannot decide. °· Try to think more seriously about your drinking behavior. °· Think of the reasons to quit. °WHERE TO GO FOR ADDITIONAL INFORMATION  °· The National Institute on Alcohol Abuse and Alcoholism (NIAAA) °www.niaaa.nih.gov °· National Council on Alcoholism and Drug Dependence (NCADD) °www.ncadd.org °· American Society of Addiction Medicine (ASAM) °www.asam.org  °Document Released: 02/28/2005 Document Revised: 05/23/2011 Document Reviewed: 10/17/2007 °ExitCare® Patient Information ©2014 [employer]. ° °

## 2013-06-08 NOTE — BHH Group Notes (Signed)
[facility] Group Notes:  (Nursing/MHT/Case Management/Adjunct)  Date:  06/08/2013  Time:  10:24 AM  Type of Therapy:  Psychoeducational Skills  Participation Level:  Active  Participation Quality:  Appropriate  Affect:  Appropriate  Cognitive:  Appropriate  Insight:  Appropriate  Engagement in Group:  Engaged  Modes of Intervention:  Discussion  Summary of Progress/Problems: Pt did attend self inventory group, pt reported that she was negative SI/HI, no AH/VH noted. Pt rated her depression as a 5, and his helplessness/hopelessness as a 5.      Gina Shelton Shanta 06/08/2013, 10:24 AM

## 2013-06-08 NOTE — BHH Suicide Risk Assessment (Signed)
   Demographic Factors:  Low socioeconomic status  Total Time spent with patient: 30 minutes  Psychiatric Specialty Exam: Physical Exam  ROS  Blood pressure 109/76, pulse 88, temperature 98.1 F (36.7 C), temperature source Oral, resp. rate 20, height 5\' 5"  (1.651 m), weight 146 lb (66.225 kg), last menstrual period 05/29/2013, SpO2 100.00%.Body mass index is 24.3 kg/(m^2).  General Appearance: Casual  Eye Contact::  Good  Speech:  Normal Rate  Volume:  Decreased  Mood:  Anxious  Affect:  Congruent  Thought Process:  Logical  Orientation:  Full (Time, Place, and Person)  Thought Content:  WDL  Suicidal Thoughts:  No  Homicidal Thoughts:  No  Memory:  Immediate;   Good Recent;   Good Remote;   Good  Judgement:  Good  Insight:  Good  Psychomotor Activity:  Normal  Concentration:  Good  Recall:  Good  Fund of Knowledge:Good  Language: Good  Akathisia:  No  Handed:  Right  AIMS (if indicated):     Assets:  Communication Skills Desire for Improvement Housing Social Support  Sleep:  Number of Hours: 6.75    Musculoskeletal: Strength & Muscle Tone: within normal limits Gait & Station: normal Patient leans: N/A   Mental Status Per Nursing Assessment::   On Admission:  NA  Current Mental Status by Physician: Patient denies any suicidal thoughts or homicidal thoughts.  Loss Factors: Legal issues  Historical Factors: Prior suicide attempts and Family history of mental illness or substance abuse  Risk Reduction Factors:   Sense of responsibility to family, Living with another person, especially a relative, Positive social support, Positive therapeutic relationship and Positive coping skills or problem solving skills  Continued Clinical Symptoms:  More than one psychiatric diagnosis  Cognitive Features That Contribute To Risk:  Polarized thinking    Suicide Risk:  Minimal: No identifiable suicidal ideation.  Patients presenting with no risk factors but with  morbid ruminations; may be classified as minimal risk based on the severity of the depressive symptoms  Discharge Diagnoses:   AXIS I:  Anxiety Disorder NOS, Major Depression, Recurrent severe and Alcohol-related disorder AXIS II:  Deferred AXIS III:   Past Medical History  Diagnosis Date  . Depression   . GERD (gastroesophageal reflux disease)   . Migraine   . Gout     ble  . Depression   . Migraine   . Anxiety    AXIS IV:  problems related to legal system/crime and problems with primary support group AXIS V:  61-70 mild symptoms  Plan Of Care/Follow-up recommendations:  Activity:  As tolerated Diet:  Unchanged from the past Patient will be followup at [facility]  Is patient on multiple antipsychotic therapies at discharge:  No   Has Patient had three or more failed trials of antipsychotic monotherapy by history:  No  Recommended Plan for Multiple Antipsychotic Therapies: NA    Admiral Marcucci T. 06/08/2013, 8:32 AM

## 2013-06-08 NOTE — Progress Notes (Signed)
[facility] Adult Case Management Discharge Plan :  Will you be returning to the same living situation after discharge: Yes,  home with  mother At discharge, do you have transportation home?:Yes,  best friend Do you have the ability to pay for your medications:Yes,  no barriers  Release of information consent forms completed and in the chart;  Patient's signature needed at discharge.  Patient to Follow up at: Follow-up Information   Follow up with [facility]. (Walk in between 8am-12pm Monday through Friday for medication management/hospital followup/assessment for therapy sessions/to get set up for group therapy if interested. )    Contact information:   [address], [place] [address] Phone: [phone] Fax: [phone]      Follow up with [facility]. (Call Herb Streater at discharge [phone]) in order to work out assessment time/date to get set up for services. )    Contact information:   [address] [place], [place] [address] Phone: [phone] Fax: none (please mail documents)      Patient denies SI/HI:   Yes,  denies    Safety Planning and Suicide Prevention discussed:  Yes,  see SI note.  Lilly Cove 06/08/2013, 9:52 AM

## 2013-06-08 NOTE — Progress Notes (Signed)
Patient ID: Gina Shelton, female   DOB: 08-Sep-1975, 38 y.o.   MRN: [id number] Patient denies si/hi/avh. Pt verbalizes understanding of discharge instructions, prescriptions and follow up appts. Pt signed for their belongings from locker. She also got her sample medications, 2 letters for disability claim and a letter for her job. Pt was walked to the lobby and was transported by family/friend.

## 2013-06-08 NOTE — BHH Group Notes (Signed)
[facility] LCSW Group Therapy  06/08/2013 12:31 PM  Type of Therapy:  Group Therapy  Participation Level:  Active  Participation Quality:  Attentive, Sharing and Supportive  Affect:  Appropriate  Cognitive:  Appropriate and Oriented  Insight:  Developing/Improving  Engagement in Therapy:  Engaged  Modes of Intervention:  Discussion, Exploration and Support  Summary of Progress/Problems:  Today's group consisted of a conversation around Supportive Framework: What is a supportive framework? What does it look like feel like and how do I discern it from and unhealthy non-supportive network? Learn how to cope when supports are not helpful and don't support you. Discuss what to do when your family/friends are not supportive.   Mckenzy was active and talkative in her last group before she discharged. She shared she is working to establish positive supports by first taking care of herself and going to her appointments for her own mental health. She shares in the past she has let people come in and out of her life but they are not consistent or bring her down.  She shows self awareness of needing consistency and someone who will hold her accountable, not someone who is only there when it is convenient to them    Lilly Cove 06/08/2013, 12:31 PM

## 2013-06-12 NOTE — Progress Notes (Signed)
Patient Discharge Instructions:  After Visit Summary (AVS):   Faxed to:  06/12/13 Discharge Summary Note:   Faxed to:  06/12/13 Psychiatric Admission Assessment Note:   Faxed to:  06/12/13 Suicide Risk Assessment - Discharge Assessment:   Faxed to:  06/12/13 Faxed/Sent to the Next Level Care provider:  06/12/13 Faxed to [employer] @ [phone] Faxed to [facility] @ [place], 06/12/2013, 3:31 PM

## 2013-11-21 ENCOUNTER — Encounter (HOSPITAL_BASED_OUTPATIENT_CLINIC_OR_DEPARTMENT_OTHER): Payer: Self-pay | Admitting: Emergency Medicine

## 2013-11-21 ENCOUNTER — Emergency Department (HOSPITAL_BASED_OUTPATIENT_CLINIC_OR_DEPARTMENT_OTHER)
Admission: EM | Admit: 2013-11-21 | Discharge: 2013-11-21 | Disposition: A | Discharge status: Home / Self Care | Payer: Medicaid Other | Attending: Emergency Medicine | Admitting: Emergency Medicine

## 2013-11-21 ENCOUNTER — Emergency Department (HOSPITAL_BASED_OUTPATIENT_CLINIC_OR_DEPARTMENT_OTHER): Payer: Medicaid Other

## 2013-11-21 DIAGNOSIS — F329 Major depressive disorder, single episode, unspecified: Secondary | ICD-10-CM | POA: Insufficient documentation

## 2013-11-21 DIAGNOSIS — F17200 Nicotine dependence, unspecified, uncomplicated: Secondary | ICD-10-CM | POA: Insufficient documentation

## 2013-11-21 DIAGNOSIS — Z79899 Other long term (current) drug therapy: Secondary | ICD-10-CM | POA: Insufficient documentation

## 2013-11-21 DIAGNOSIS — F411 Generalized anxiety disorder: Secondary | ICD-10-CM | POA: Insufficient documentation

## 2013-11-21 DIAGNOSIS — F3289 Other specified depressive episodes: Secondary | ICD-10-CM | POA: Insufficient documentation

## 2013-11-21 DIAGNOSIS — R05 Cough: Secondary | ICD-10-CM | POA: Insufficient documentation

## 2013-11-21 DIAGNOSIS — Z8719 Personal history of other diseases of the digestive system: Secondary | ICD-10-CM | POA: Insufficient documentation

## 2013-11-21 DIAGNOSIS — G43909 Migraine, unspecified, not intractable, without status migrainosus: Secondary | ICD-10-CM | POA: Insufficient documentation

## 2013-11-21 DIAGNOSIS — J302 Other seasonal allergic rhinitis: Secondary | ICD-10-CM

## 2013-11-21 DIAGNOSIS — R059 Cough, unspecified: Secondary | ICD-10-CM | POA: Insufficient documentation

## 2013-11-21 DIAGNOSIS — R0789 Other chest pain: Secondary | ICD-10-CM

## 2013-11-21 DIAGNOSIS — Z8639 Personal history of other endocrine, nutritional and metabolic disease: Secondary | ICD-10-CM | POA: Insufficient documentation

## 2013-11-21 DIAGNOSIS — R071 Chest pain on breathing: Secondary | ICD-10-CM | POA: Insufficient documentation

## 2013-11-21 DIAGNOSIS — J309 Allergic rhinitis, unspecified: Secondary | ICD-10-CM | POA: Insufficient documentation

## 2013-11-21 DIAGNOSIS — Z862 Personal history of diseases of the blood and blood-forming organs and certain disorders involving the immune mechanism: Secondary | ICD-10-CM | POA: Insufficient documentation

## 2013-11-21 MED ORDER — ALBUTEROL SULFATE HFA 108 (90 BASE) MCG/ACT IN AERS: 1.0000 | INHALATION_SPRAY | RESPIRATORY_TRACT | Status: DC | PRN

## 2013-11-21 MED ORDER — LORATADINE 10 MG PO TABS: 10.0000 mg | ORAL_TABLET | Freq: Every day | ORAL | Status: DC

## 2013-11-21 NOTE — Discharge Instructions (Signed)
Allergies °Allergies may happen from anything your body is sensitive to. This may be food, medicines, pollens, chemicals, and nearly anything around you in everyday life that produces allergens. An allergen is anything that causes an allergy producing substance. Heredity is often a factor in causing these problems. This means you may have some of the same allergies as your parents. °Food allergies happen in all age groups. Food allergies are some of the most severe and life threatening. Some common food allergies are cow's milk, seafood, eggs, nuts, wheat, and soybeans. °SYMPTOMS  °· Swelling around the mouth. °· An itchy red rash or hives. °· Vomiting or diarrhea. °· Difficulty breathing. °SEVERE ALLERGIC REACTIONS ARE LIFE-THREATENING. °This reaction is called anaphylaxis. It can cause the mouth and throat to swell and cause difficulty with breathing and swallowing. In severe reactions only a trace amount of food (for example, peanut oil in a salad) may cause death within seconds. °Seasonal allergies occur in all age groups. These are seasonal because they usually occur during the same season every year. They may be a reaction to molds, grass pollens, or tree pollens. Other causes of problems are house dust mite allergens, pet dander, and mold spores. The symptoms often consist of nasal congestion, a runny itchy nose associated with sneezing, and tearing itchy eyes. There is often an associated itching of the mouth and ears. The problems happen when you come in contact with pollens and other allergens. Allergens are the particles in the air that the body reacts to with an allergic reaction. This causes you to release allergic antibodies. Through a chain of events, these eventually cause you to release histamine into the blood stream. Although it is meant to be protective to the body, it is this release that causes your discomfort. This is why you were given anti-histamines to feel better.  If you are unable to  pinpoint the offending allergen, it may be determined by skin or blood testing. Allergies cannot be cured but can be controlled with medicine. °Hay fever is a collection of all or some of the seasonal allergy problems. It may often be treated with simple over-the-counter medicine such as diphenhydramine. Take medicine as directed. Do not drink alcohol or drive while taking this medicine. Check with your caregiver or package insert for child dosages. °If these medicines are not effective, there are many new medicines your caregiver can prescribe. Stronger medicine such as nasal spray, eye drops, and corticosteroids may be used if the first things you try do not work well. Other treatments such as immunotherapy or desensitizing injections can be used if all else fails. Follow up with your caregiver if problems continue. These seasonal allergies are usually not life threatening. They are generally more of a nuisance that can often be handled using medicine. °HOME CARE INSTRUCTIONS  °· If unsure what causes a reaction, keep a diary of foods eaten and symptoms that follow. Avoid foods that cause reactions. °· If hives or rash are present: °¨ Take medicine as directed. °¨ You may use an over-the-counter antihistamine (diphenhydramine) for hives and itching as needed. °¨ Apply cold compresses (cloths) to the skin or take baths in cool water. Avoid hot baths or showers. Heat will make a rash and itching worse. °· If you are severely allergic: °¨ Following a treatment for a severe reaction, hospitalization is often required for closer follow-up. °¨ Wear a medic-alert bracelet or necklace stating the allergy. °¨ You and your family must learn how to give adrenaline or use   an anaphylaxis kit.  If you have had a severe reaction, always carry your anaphylaxis kit or EpiPen with you. Use this medicine as directed by your caregiver if a severe reaction is occurring. Failure to do so could have a fatal outcome. SEEK MEDICAL  CARE IF:  You suspect a food allergy. Symptoms generally happen within 30 minutes of eating a food.  Your symptoms have not gone away within 2 days or are getting worse.  You develop new symptoms.  You want to retest yourself or your child with a food or drink you think causes an allergic reaction. Never do this if an anaphylactic reaction to that food or drink has happened before. Only do this under the care of a caregiver. SEEK IMMEDIATE MEDICAL CARE IF:   You have difficulty breathing, are wheezing, or have a tight feeling in your chest or throat.  You have a swollen mouth, or you have hives, swelling, or itching all over your body.  You have had a severe reaction that has responded to your anaphylaxis kit or an EpiPen. These reactions may return when the medicine has worn off. These reactions should be considered life threatening. MAKE SURE YOU:   Understand these instructions.  Will watch your condition.  Will get help right away if you are not doing well or get worse. Document Released: 05/24/2002 Document Revised: 06/25/2012 Document Reviewed: 10/29/2007 [facility] Patient Information 2015 [place], [place]. This information is not intended to replace advice given to you by your health care provider. Make sure you discuss any questions you have with your health care provider.  Chest Wall Pain Chest wall pain is pain in or around the bones and muscles of your chest. It may take up to 6 weeks to get better. It may take longer if you must stay physically active in your work and activities.  CAUSES  Chest wall pain may happen on its own. However, it may be caused by:  A viral illness like the flu.  Injury.  Coughing.  Exercise.  Arthritis.  Fibromyalgia.  Shingles. HOME CARE INSTRUCTIONS   Avoid overtiring physical activity. Try not to strain or perform activities that cause pain. This includes any activities using your chest or your abdominal and side muscles,  especially if heavy weights are used.  Put ice on the sore area.  Put ice in a plastic bag.  Place a towel between your skin and the bag.  Leave the ice on for 15-20 minutes per hour while awake for the first 2 days.  Only take over-the-counter or prescription medicines for pain, discomfort, or fever as directed by your caregiver. SEEK IMMEDIATE MEDICAL CARE IF:   Your pain increases, or you are very uncomfortable.  You have a fever.  Your chest pain becomes worse.  You have new, unexplained symptoms.  You have nausea or vomiting.  You feel sweaty or lightheaded.  You have a cough with phlegm (sputum), or you cough up blood. MAKE SURE YOU:   Understand these instructions.  Will watch your condition.  Will get help right away if you are not doing well or get worse. Document Released: 02/28/2005 Document Revised: 05/23/2011 Document Reviewed: 10/25/2010 [facility] Patient Information 2015 [place], [place]. This information is not intended to replace advice given to you by your health care provider. Make sure you discuss any questions you have with your health care provider.  Acute Bronchitis Bronchitis is inflammation of the airways that extend from the windpipe into the lungs (bronchi). The inflammation often  causes mucus to develop. This leads to a cough, which is the most common symptom of bronchitis.  In acute bronchitis, the condition usually develops suddenly and goes away over time, usually in a couple weeks. Smoking, allergies, and asthma can make bronchitis worse. Repeated episodes of bronchitis may cause further lung problems.  CAUSES Acute bronchitis is most often caused by the same virus that causes a cold. The virus can spread from person to person (contagious) through coughing, sneezing, and touching contaminated objects. SIGNS AND SYMPTOMS   Cough.   Fever.   Coughing up mucus.   Body aches.   Chest congestion.   Chills.   Shortness of breath.    Sore throat.  DIAGNOSIS  Acute bronchitis is usually diagnosed through a physical exam. Your health care provider will also ask you questions about your medical history. Tests, such as chest X-rays, are sometimes done to rule out other conditions.  TREATMENT  Acute bronchitis usually goes away in a couple weeks. Oftentimes, no medical treatment is necessary. Medicines are sometimes given for relief of fever or cough. Antibiotic medicines are usually not needed but may be prescribed in certain situations. In some cases, an inhaler may be recommended to help reduce shortness of breath and control the cough. A cool mist vaporizer may also be used to help thin bronchial secretions and make it easier to clear the chest.  HOME CARE INSTRUCTIONS  Get plenty of rest.   Drink enough fluids to keep your urine clear or pale yellow (unless you have a medical condition that requires fluid restriction). Increasing fluids may help thin your respiratory secretions (sputum) and reduce chest congestion, and it will prevent dehydration.   Take medicines only as directed by your health care provider.  If you were prescribed an antibiotic medicine, finish it all even if you start to feel better.  Avoid smoking and secondhand smoke. Exposure to cigarette smoke or irritating chemicals will make bronchitis worse. If you are a smoker, consider using nicotine gum or skin patches to help control withdrawal symptoms. Quitting smoking will help your lungs heal faster.   Reduce the chances of another bout of acute bronchitis by washing your hands frequently, avoiding people with cold symptoms, and trying not to touch your hands to your mouth, nose, or eyes.   Keep all follow-up visits as directed by your health care provider.  SEEK MEDICAL CARE IF: Your symptoms do not improve after 1 week of treatment.  SEEK IMMEDIATE MEDICAL CARE IF:  You develop an increased fever or chills.   You have chest pain.    You have severe shortness of breath.  You have bloody sputum.   You develop dehydration.  You faint or repeatedly feel like you are going to pass out.  You develop repeated vomiting.  You develop a severe headache. MAKE SURE YOU:   Understand these instructions.  Will watch your condition.  Will get help right away if you are not doing well or get worse. Document Released: 04/07/2004 Document Revised: 07/15/2013 Document Reviewed: 08/21/2012 [facility] Patient Information 2015 [place], [place]. This information is not intended to replace advice given to you by your health care provider. Make sure you discuss any questions you have with your health care provider.

## 2013-11-21 NOTE — ED Notes (Signed)
Reports productive cough with clear sputum. Sts CP AFTER coughing.

## 2013-11-21 NOTE — ED Provider Notes (Signed)
Medical screening examination/treatment/procedure(s) were performed by non-physician practitioner and as supervising physician I was immediately available for consultation/collaboration.   EKG Interpretation None        Evelina Bucy, MD 11/21/13 2349

## 2013-11-21 NOTE — ED Provider Notes (Signed)
CSN: [id number]     Arrival date & time 11/21/13  1517 History   First MD Initiated Contact with Patient 11/21/13 1558     Chief Complaint  Patient presents with  . Cough     (Consider location/radiation/quality/duration/timing/severity/associated sxs/prior Treatment) HPI Gina Shelton is a 38 year old female with past medical history of bronchitis who presents to the emergency room today with 8 days of cough. Patient states her cough began gradually 8 days ago and has since become slightly worse. She states she has also been experiencing "itchy throat, eyes, ears, sore throat". She states this is consistent with her typical seasonal allergies, and has not been taking her Claritin 8/28. She states nothing aggravates or alleviates her itchiness. She states it is constant, and has no aggravating or alleviating factors the She states her cough has become productive of a mild white/clear sputum. Patient states her cough is worse at night, when she gets "coughing fits". Patient states she has been at [facility] since 11/07/13 where her Claritin and albuterol inhaler were taken away due to the fact that she had no written prescription for them. She states she has also been smoking more cigarettes since she has been in [place]. She states there is nothing that aggravates or alleviates her symptoms. She denies associated fever, nausea, vomiting, wheezing, dizziness, trouble swallowing, trismus.   Past Medical History  Diagnosis Date  . Depression   . GERD (gastroesophageal reflux disease)   . Migraine   . Gout     ble  . Depression   . Migraine   . Anxiety    Past Surgical History  Procedure Laterality Date  . Cesarean section  infection at incission requiring return to OR x 2  . Tubal ligation     Family History  Problem Relation Age of Onset  . Anesthesia problems Neg Hx   . Hypotension Neg Hx   . Malignant hyperthermia Neg Hx   . Pseudochol deficiency Neg Hx   . Hypertension Mother   .  Arthritis Sister    History  Substance Use Topics  . Smoking status: Current Every Day Smoker -- 0.25 packs/day for 20 years    Types: Cigarettes  . Smokeless tobacco: Never Used     Comment: Currently using Nicotine patch  . Alcohol Use: 0.0 oz/week     Comment: socially   OB History   Grav Para Term Preterm Abortions TAB SAB Ect Mult Living   3 3        3      Review of Systems  Constitutional: Negative for fever.  HENT: Positive for congestion, postnasal drip, rhinorrhea, sneezing, sore throat and tinnitus. Negative for ear discharge, ear pain, facial swelling, hearing loss, sinus pressure, trouble swallowing and voice change.        "Itchy throat"  Eyes: Positive for discharge and itching. Negative for pain, redness and visual disturbance.  Respiratory: Positive for cough. Negative for shortness of breath.   Cardiovascular: Negative for chest pain.  Gastrointestinal: Negative for nausea, vomiting and abdominal pain.  Genitourinary: Negative for dysuria.  Musculoskeletal: Negative for neck pain.  Skin: Negative for rash.  Allergic/Immunologic: Positive for environmental allergies.  Neurological: Negative for dizziness, weakness and numbness.  Psychiatric/Behavioral: Negative.       Allergies  Review of patient's allergies indicates no known allergies.  Home Medications   Prior to Admission medications   Medication Sig Start Date End Date Taking? Authorizing Provider  albuterol (PROVENTIL HFA;VENTOLIN HFA) 108 (90 BASE) MCG/ACT inhaler  Inhale 1-2 puffs into the lungs every 4 (four) hours as needed for wheezing or shortness of breath (cough). 11/21/13   Carrie Mew, PA-C  buPROPion (WELLBUTRIN XL) 150 MG 24 hr tablet Take 1 tablet (150 mg total) by mouth daily. 06/08/13   Waylan Boga, NP  carbamazepine (TEGRETOL XR) 200 MG 12 hr tablet Take 1 tablet (200 mg total) by mouth 2 (two) times daily. Mood stabilization. 06/08/13   Waylan Boga, NP  feeding supplement, ENSURE  COMPLETE, (ENSURE COMPLETE) LIQD Take 237 mLs by mouth 2 (two) times daily between meals. 06/08/13   Waylan Boga, NP  gabapentin (NEURONTIN) 100 MG capsule Take 1 capsule (100 mg total) by mouth 3 (three) times daily. Anxiety, neuropathic pain. 06/08/13   Waylan Boga, NP  loratadine (CLARITIN) 10 MG tablet Take 1 tablet (10 mg total) by mouth daily. One po daily x 5 days 11/21/13   Carrie Mew, PA-C  methocarbamol (ROBAXIN) 500 MG tablet Take 1 tablet (500 mg total) by mouth every 6 (six) hours as needed for muscle spasms. 06/08/13   Waylan Boga, NP  risperiDONE (RISPERDAL) 1 MG tablet Take 1 tablet (1 mg total) by mouth at bedtime. 06/08/13   Waylan Boga, NP  sertraline (ZOLOFT) 50 MG tablet Take 3 tablets (150 mg total) by mouth daily. For anxiety and depression. 06/08/13   Waylan Boga, NP  traZODone (DESYREL) 100 MG tablet Take 1 tablet (100 mg total) by mouth at bedtime as needed for sleep. 06/08/13   Waylan Boga, NP   BP 135/86  Pulse 79  Temp(Src) 98.2 F (36.8 C) (Oral)  Resp 18  Ht 5\' 2"  (1.575 m)  Wt 173 lb (78.472 kg)  BMI 31.63 kg/m2  SpO2 100%  LMP 11/21/2013 Physical Exam  Constitutional: She is oriented to person, place, and time. She appears well-developed and well-nourished. No distress.  HENT:  Head: Normocephalic and atraumatic.  Right Ear: Tympanic membrane and external ear normal. No tenderness. No mastoid tenderness. Tympanic membrane is not erythematous.  Left Ear: Tympanic membrane and external ear normal. No tenderness. No mastoid tenderness. Tympanic membrane is not erythematous.  Nose: Rhinorrhea present. No mucosal edema, nasal deformity or septal deviation.  No foreign bodies. Right sinus exhibits no maxillary sinus tenderness and no frontal sinus tenderness. Left sinus exhibits no maxillary sinus tenderness and no frontal sinus tenderness.  Mouth/Throat: Uvula is midline and mucous membranes are normal. Mucous membranes are not pale, not dry and not cyanotic.  No oral lesions. No trismus in the jaw. No dental abscesses or uvula swelling. Posterior oropharyngeal erythema present. No oropharyngeal exudate, posterior oropharyngeal edema or tonsillar abscesses.  Mild amount of clear rhinorrhea noted in the ears bilaterally. Nasal turbinates slightly purple in color with no obvious edema, erythema.   Eyes: Right eye exhibits no discharge. Left eye exhibits no discharge. No scleral icterus.  Neck: Normal range of motion.  Cardiovascular: Normal rate, regular rhythm, S1 normal and normal heart sounds.   Pulmonary/Chest: Effort normal and breath sounds normal. No accessory muscle usage. Not tachypneic. No respiratory distress. She has no wheezes.  Mild tenderness noted to palpation of second or third rib at left upper sternal border region.  Abdominal: Soft. Normal appearance and bowel sounds are normal. There is no tenderness.  Musculoskeletal: Normal range of motion. She exhibits no edema and no tenderness.  Lymphadenopathy:       Head (right side): No submental, no submandibular, no tonsillar, no preauricular, no posterior auricular and no occipital  adenopathy present.       Head (left side): No submental, no submandibular, no tonsillar, no preauricular, no posterior auricular and no occipital adenopathy present.    She has no cervical adenopathy.  Neurological: She is alert and oriented to person, place, and time. No cranial nerve deficit. Coordination normal.  Skin: Skin is warm and dry. No rash noted. She is not diaphoretic.  Psychiatric: She has a normal mood and affect.    ED Course  Procedures (including critical care time) Labs Review Labs Reviewed - No data to display  Imaging Review Dg Chest 2 View  11/21/2013   CLINICAL DATA:  Chest pain and cough.  EXAM: CHEST  2 VIEW  COMPARISON:  PA and lateral chest 08/28/2012.  FINDINGS: Heart size and mediastinal contours are within normal limits. Both lungs are clear. Visualized skeletal structures are  unremarkable.  IMPRESSION: Negative exam.   Electronically Signed   By: Inge Rise M.D.   On: 11/21/2013 16:10     EKG Interpretation None      MDM   Final diagnoses:  Seasonal allergies  Chest wall pain    Itchy eyes, ears, throat, mildly productive cough x8 days of whitish sputum. Patient out of Claritin, albuterol. Workup exam consistent with seasonal allergies. Chest radiographs are unremarkable for any signs of bronchitis or pneumonia. No wheezing, shortness of breath, hypoxia noted on exam. Patient describes a mild chest wall pain "only when she coughs". Patient states the pain is not present otherwise. Mild tenderness noted to area of second and third ribs at left upper sternal border. Patient states the pain is not present unless palpated or when she coughs. I believe his pain is consistent with musculoskeletal pain. PERC criteria negative. Pain inconsistent with cardiac etiology. Patient nontoxic appearing, speaking in full, clear sentences and in no acute distress. We will prescribe patient with her medications and discharge back to [facility].  Patient encouraged to followup with her primary care physician and return to the ER should her symptoms change, worsen or should she have any questions or concerns.   BP 135/86  Pulse 79  Temp(Src) 98.2 F (36.8 C) (Oral)  Resp 18  Ht 5\' 2"  (1.575 m)  Wt 173 lb (78.472 kg)  BMI 31.63 kg/m2  SpO2 100%  LMP 11/21/2013   Signed,  Dahlia Bailiff, PA-C 11:39 PM   This patient seen and discussed with Dr. Evelina Bucy, MD     Carrie Mew, PA-C 11/21/13 2334  Carrie Mew, PA-C 11/21/13 2340

## 2013-12-04 ENCOUNTER — Encounter (HOSPITAL_BASED_OUTPATIENT_CLINIC_OR_DEPARTMENT_OTHER): Payer: Self-pay | Admitting: Emergency Medicine

## 2013-12-04 ENCOUNTER — Emergency Department (HOSPITAL_BASED_OUTPATIENT_CLINIC_OR_DEPARTMENT_OTHER): Payer: Medicaid Other

## 2013-12-04 ENCOUNTER — Emergency Department (HOSPITAL_BASED_OUTPATIENT_CLINIC_OR_DEPARTMENT_OTHER)
Admission: EM | Admit: 2013-12-04 | Discharge: 2013-12-04 | Disposition: A | Discharge status: Home / Self Care | Payer: Medicaid Other | Attending: Emergency Medicine | Admitting: Emergency Medicine

## 2013-12-04 DIAGNOSIS — Z862 Personal history of diseases of the blood and blood-forming organs and certain disorders involving the immune mechanism: Secondary | ICD-10-CM | POA: Insufficient documentation

## 2013-12-04 DIAGNOSIS — F3289 Other specified depressive episodes: Secondary | ICD-10-CM | POA: Insufficient documentation

## 2013-12-04 DIAGNOSIS — IMO0002 Reserved for concepts with insufficient information to code with codable children: Secondary | ICD-10-CM | POA: Insufficient documentation

## 2013-12-04 DIAGNOSIS — F329 Major depressive disorder, single episode, unspecified: Secondary | ICD-10-CM | POA: Insufficient documentation

## 2013-12-04 DIAGNOSIS — Z8639 Personal history of other endocrine, nutritional and metabolic disease: Secondary | ICD-10-CM | POA: Insufficient documentation

## 2013-12-04 DIAGNOSIS — G43909 Migraine, unspecified, not intractable, without status migrainosus: Secondary | ICD-10-CM | POA: Insufficient documentation

## 2013-12-04 DIAGNOSIS — R079 Chest pain, unspecified: Secondary | ICD-10-CM | POA: Insufficient documentation

## 2013-12-04 DIAGNOSIS — R059 Cough, unspecified: Secondary | ICD-10-CM | POA: Insufficient documentation

## 2013-12-04 DIAGNOSIS — F17200 Nicotine dependence, unspecified, uncomplicated: Secondary | ICD-10-CM | POA: Insufficient documentation

## 2013-12-04 DIAGNOSIS — F411 Generalized anxiety disorder: Secondary | ICD-10-CM | POA: Insufficient documentation

## 2013-12-04 DIAGNOSIS — R0602 Shortness of breath: Secondary | ICD-10-CM | POA: Insufficient documentation

## 2013-12-04 DIAGNOSIS — Z79899 Other long term (current) drug therapy: Secondary | ICD-10-CM | POA: Insufficient documentation

## 2013-12-04 DIAGNOSIS — Z8719 Personal history of other diseases of the digestive system: Secondary | ICD-10-CM | POA: Insufficient documentation

## 2013-12-04 DIAGNOSIS — R05 Cough: Secondary | ICD-10-CM

## 2013-12-04 MED ORDER — BENZONATATE 100 MG PO CAPS: 100.0000 mg | ORAL_CAPSULE | Freq: Three times a day (TID) | ORAL | Status: DC

## 2013-12-04 MED ORDER — PREDNISONE 50 MG PO TABS
60.0000 mg | ORAL_TABLET | Freq: Once | ORAL | Status: AC
  Administered 2013-12-04: 60 mg via ORAL
  Filled 2013-12-04 (×2): qty 1

## 2013-12-04 MED ORDER — PREDNISONE 10 MG PO TABS: 20.0000 mg | ORAL_TABLET | Freq: Every day | ORAL | Status: DC

## 2013-12-04 NOTE — ED Provider Notes (Signed)
CSN: [id number]     Arrival date & time 12/04/13  1531 History   First MD Initiated Contact with Patient 12/04/13 1620     Chief Complaint  Patient presents with  . Cough     (Consider location/radiation/quality/duration/timing/severity/associated sxs/prior Treatment) HPI Comments: Patient presents with cough. She states the cough is gone on for about 2 weeks. Initially it was nonproductive. Now she complains of some clear white mucus production. Chest and pain in the Center of her chest from the coughing. She has shortness of breath during coughing episodes but denies any other shortness of breath. There's no exertional symptoms. She denies any fevers or chills. She does have some clear rhinorrhea. She was recently started on Claritin as well as an albuterol inhaler. She was told by her psychiatrist that she might need a steroid pack. She is a smoker but denies any history of asthma. She denies any leg pain or swelling.  Patient is a 38 y.o. female presenting with cough.  Cough Associated symptoms: chest pain (Only with coughing) and shortness of breath (only during coughing spells)   Associated symptoms: no chills, no diaphoresis, no fever, no headaches, no rash and no rhinorrhea     Past Medical History  Diagnosis Date  . Depression   . GERD (gastroesophageal reflux disease)   . Migraine   . Gout     ble  . Depression   . Migraine   . Anxiety    Past Surgical History  Procedure Laterality Date  . Cesarean section  infection at incission requiring return to OR x 2  . Tubal ligation     Family History  Problem Relation Age of Onset  . Anesthesia problems Neg Hx   . Hypotension Neg Hx   . Malignant hyperthermia Neg Hx   . Pseudochol deficiency Neg Hx   . Hypertension Mother   . Arthritis Sister    History  Substance Use Topics  . Smoking status: Current Every Day Smoker -- 0.25 packs/day for 20 years    Types: Cigarettes  . Smokeless tobacco: Never Used     Comment:  Currently using Nicotine patch  . Alcohol Use: 0.0 oz/week     Comment: socially   OB History   Grav Para Term Preterm Abortions TAB SAB Ect Mult Living   3 3        3      Review of Systems  Constitutional: Negative for fever, chills, diaphoresis and fatigue.  HENT: Negative for congestion, rhinorrhea and sneezing.   Eyes: Negative.   Respiratory: Positive for cough and shortness of breath (only during coughing spells). Negative for chest tightness.   Cardiovascular: Positive for chest pain (Only with coughing). Negative for leg swelling.  Gastrointestinal: Negative for nausea, vomiting, abdominal pain, diarrhea and blood in stool.  Genitourinary: Negative for frequency, hematuria, flank pain and difficulty urinating.  Musculoskeletal: Negative for arthralgias and back pain.  Skin: Negative for rash.  Neurological: Negative for dizziness, speech difficulty, weakness, numbness and headaches.      Allergies  Review of patient's allergies indicates no known allergies.  Home Medications   Prior to Admission medications   Medication Sig Start Date End Date Taking? Authorizing Provider  citalopram (CELEXA) 20 MG tablet Take 20 mg by mouth daily.   Yes Historical Provider, MD  albuterol (PROVENTIL HFA;VENTOLIN HFA) 108 (90 BASE) MCG/ACT inhaler Inhale 1-2 puffs into the lungs every 4 (four) hours as needed for wheezing or shortness of breath (cough). 11/21/13  Carrie Mew, PA-C  benzonatate (TESSALON) 100 MG capsule Take 1 capsule (100 mg total) by mouth every 8 (eight) hours. 12/04/13   Malvin Johns, MD  buPROPion (WELLBUTRIN XL) 150 MG 24 hr tablet Take 1 tablet (150 mg total) by mouth daily. 06/08/13   Waylan Boga, NP  carbamazepine (TEGRETOL XR) 200 MG 12 hr tablet Take 1 tablet (200 mg total) by mouth 2 (two) times daily. Mood stabilization. 06/08/13   Waylan Boga, NP  feeding supplement, ENSURE COMPLETE, (ENSURE COMPLETE) LIQD Take 237 mLs by mouth 2 (two) times daily between  meals. 06/08/13   Waylan Boga, NP  gabapentin (NEURONTIN) 100 MG capsule Take 1 capsule (100 mg total) by mouth 3 (three) times daily. Anxiety, neuropathic pain. 06/08/13   Waylan Boga, NP  loratadine (CLARITIN) 10 MG tablet Take 1 tablet (10 mg total) by mouth daily. One po daily x 5 days 11/21/13   Carrie Mew, PA-C  methocarbamol (ROBAXIN) 500 MG tablet Take 1 tablet (500 mg total) by mouth every 6 (six) hours as needed for muscle spasms. 06/08/13   Waylan Boga, NP  predniSONE (DELTASONE) 10 MG tablet Take 2 tablets (20 mg total) by mouth daily. 12/04/13   Malvin Johns, MD  risperiDONE (RISPERDAL) 1 MG tablet Take 1 tablet (1 mg total) by mouth at bedtime. 06/08/13   Waylan Boga, NP  traZODone (DESYREL) 100 MG tablet Take 1 tablet (100 mg total) by mouth at bedtime as needed for sleep. 06/08/13   Waylan Boga, NP   BP 126/84  Pulse 110  Temp(Src) 99.1 F (37.3 C) (Oral)  Resp 16  Ht 5\' 2"  (1.575 m)  Wt 173 lb (78.472 kg)  BMI 31.63 kg/m2  SpO2 100%  LMP 11/21/2013 Physical Exam  Constitutional: She is oriented to person, place, and time. She appears well-developed and well-nourished.  HENT:  Head: Normocephalic and atraumatic.  Eyes: Pupils are equal, round, and reactive to light.  Neck: Normal range of motion. Neck supple.  Cardiovascular: Normal rate, regular rhythm and normal heart sounds.   Pulmonary/Chest: Effort normal and breath sounds normal. No respiratory distress. She has no wheezes. She has no rales. She exhibits tenderness (Mild reproducible tenderness along the sternum).  Abdominal: Soft. Bowel sounds are normal. There is no tenderness. There is no rebound and no guarding.  Musculoskeletal: Normal range of motion. She exhibits no edema.  No calf tenderness  Lymphadenopathy:    She has no cervical adenopathy.  Neurological: She is alert and oriented to person, place, and time.  Skin: Skin is warm and dry. No rash noted.  Psychiatric: She has a normal mood and affect.     ED Course  Procedures (including critical care time) Labs Review Labs Reviewed - No data to display  Imaging Review Dg Chest 2 View  12/04/2013   CLINICAL DATA:  Cough for 2 weeks  EXAM: CHEST  2 VIEW  COMPARISON:  11/21/2013  FINDINGS: The heart size and mediastinal contours are within normal limits. Both lungs are clear. The visualized skeletal structures are unremarkable.  IMPRESSION: No acute cardiopulmonary process.  No change from prior   Electronically Signed   By: Suzy Bouchard M.D.   On: 12/04/2013 17:25     EKG Interpretation None      MDM   Final diagnoses:  Cough    Patient presents with cough. There is no evidence of pneumonia. There is no suggestions of pulmonary embolus. Her pain is nonpleuritic. She only has shortness of breath  during coughing spells there is no unilateral leg swelling. There's no tachypnea or hypoxia. There is no tachycardia. She is breathing well with no increased work of breathing. She was discharged home in good condition. She was given a prescription for prednisone burst. She will continue to use albuterol inhaler as needed as well as the Claritin. Also gave her a prescription for [employer]. Encouraged her followup with primary care symptoms continue.    Malvin Johns, MD 12/04/13 [phone]

## 2013-12-04 NOTE — Discharge Instructions (Signed)
Cough, Adult  A cough is a reflex that helps clear your throat and airways. It can help heal the body or may be a reaction to an irritated airway. A cough may only last 2 or 3 weeks (acute) or may last more than 8 weeks (chronic).  CAUSES Acute cough:  Viral or bacterial infections. Chronic cough:  Infections.  Allergies.  Asthma.  Post-nasal drip.  Smoking.  Heartburn or acid reflux.  Some medicines.  Chronic lung problems (COPD).  Cancer. SYMPTOMS   Cough.  Fever.  Chest pain.  Increased breathing rate.  High-pitched whistling sound when breathing (wheezing).  Colored mucus that you cough up (sputum). TREATMENT   A bacterial cough may be treated with antibiotic medicine.  A viral cough must run its course and will not respond to antibiotics.  Your caregiver may recommend other treatments if you have a chronic cough. HOME CARE INSTRUCTIONS   Only take over-the-counter or prescription medicines for pain, discomfort, or fever as directed by your caregiver. Use cough suppressants only as directed by your caregiver.  Use a cold steam vaporizer or humidifier in your bedroom or home to help loosen secretions.  Sleep in a semi-upright position if your cough is worse at night.  Rest as needed.  Stop smoking if you smoke. SEEK IMMEDIATE MEDICAL CARE IF:   You have pus in your sputum.  Your cough starts to worsen.  You cannot control your cough with suppressants and are losing sleep.  You begin coughing up blood.  You have difficulty breathing.  You develop pain which is getting worse or is uncontrolled with medicine.  You have a fever. MAKE SURE YOU:   Understand these instructions.  Will watch your condition.  Will get help right away if you are not doing well or get worse. Document Released: 08/27/2010 Document Revised: 05/23/2011 Document Reviewed: 08/27/2010 [employer] Patient Information 2015 [employer]. This information is not intended  to replace advice given to you by your health care provider. Make sure you discuss any questions you have with your health care provider.  

## 2013-12-04 NOTE — ED Notes (Addendum)
Pt c/o seen here 9/3 for same cont cough increase use of inhaler , pt is from [facility]

## 2014-01-13 ENCOUNTER — Encounter (HOSPITAL_BASED_OUTPATIENT_CLINIC_OR_DEPARTMENT_OTHER): Payer: Self-pay | Admitting: Emergency Medicine

## 2014-04-01 ENCOUNTER — Encounter (HOSPITAL_COMMUNITY): Payer: Self-pay | Admitting: Neurology

## 2014-04-01 ENCOUNTER — Emergency Department (HOSPITAL_COMMUNITY)
Admission: EM | Admit: 2014-04-01 | Discharge: 2014-04-01 | Disposition: A | Discharge status: Home / Self Care | Payer: Medicaid Other | Attending: Emergency Medicine | Admitting: Emergency Medicine

## 2014-04-01 DIAGNOSIS — G43909 Migraine, unspecified, not intractable, without status migrainosus: Secondary | ICD-10-CM | POA: Insufficient documentation

## 2014-04-01 DIAGNOSIS — Z7952 Long term (current) use of systemic steroids: Secondary | ICD-10-CM | POA: Insufficient documentation

## 2014-04-01 DIAGNOSIS — Z720 Tobacco use: Secondary | ICD-10-CM | POA: Insufficient documentation

## 2014-04-01 DIAGNOSIS — M109 Gout, unspecified: Secondary | ICD-10-CM | POA: Insufficient documentation

## 2014-04-01 DIAGNOSIS — Z79899 Other long term (current) drug therapy: Secondary | ICD-10-CM | POA: Insufficient documentation

## 2014-04-01 DIAGNOSIS — R519 Headache, unspecified: Secondary | ICD-10-CM

## 2014-04-01 DIAGNOSIS — Z8719 Personal history of other diseases of the digestive system: Secondary | ICD-10-CM | POA: Insufficient documentation

## 2014-04-01 DIAGNOSIS — F329 Major depressive disorder, single episode, unspecified: Secondary | ICD-10-CM | POA: Insufficient documentation

## 2014-04-01 DIAGNOSIS — R51 Headache: Secondary | ICD-10-CM

## 2014-04-01 DIAGNOSIS — F419 Anxiety disorder, unspecified: Secondary | ICD-10-CM | POA: Insufficient documentation

## 2014-04-01 MED ORDER — HYDROCODONE-ACETAMINOPHEN 5-325 MG PO TABS: 1.0000 | ORAL_TABLET | Freq: Four times a day (QID) | ORAL | Status: DC | PRN

## 2014-04-01 MED ORDER — DEXAMETHASONE SODIUM PHOSPHATE 10 MG/ML IJ SOLN
10.0000 mg | Freq: Once | INTRAMUSCULAR | Status: AC
  Administered 2014-04-01: 10 mg via INTRAVENOUS
  Filled 2014-04-01: qty 1

## 2014-04-01 MED ORDER — METOCLOPRAMIDE HCL 5 MG/ML IJ SOLN
10.0000 mg | Freq: Once | INTRAMUSCULAR | Status: AC
  Administered 2014-04-01: 10 mg via INTRAVENOUS
  Filled 2014-04-01: qty 2

## 2014-04-01 MED ORDER — KETOROLAC TROMETHAMINE 30 MG/ML IJ SOLN
30.0000 mg | Freq: Once | INTRAMUSCULAR | Status: AC
  Administered 2014-04-01: 30 mg via INTRAVENOUS
  Filled 2014-04-01: qty 1

## 2014-04-01 MED ORDER — DIPHENHYDRAMINE HCL 50 MG/ML IJ SOLN
25.0000 mg | Freq: Once | INTRAMUSCULAR | Status: AC
  Administered 2014-04-01: 25 mg via INTRAVENOUS
  Filled 2014-04-01: qty 1

## 2014-04-01 MED ORDER — SODIUM CHLORIDE 0.9 % IV BOLUS (SEPSIS)
1000.0000 mL | Freq: Once | INTRAVENOUS | Status: AC
  Administered 2014-04-01: 1000 mL via INTRAVENOUS

## 2014-04-01 NOTE — Discharge Instructions (Signed)
Hydrocodone as prescribed as needed for pain.  Return to the emergency department if your symptoms significantly worsen or change.   General Headache Without Cause A headache is pain or discomfort felt around the head or neck area. The specific cause of a headache may not be found. There are many causes and types of headaches. A few common ones are:  Tension headaches.  Migraine headaches.  Cluster headaches.  Chronic daily headaches. HOME CARE INSTRUCTIONS   Keep all follow-up appointments with your caregiver or any specialist referral.  Only take over-the-counter or prescription medicines for pain or discomfort as directed by your caregiver.  Lie down in a dark, quiet room when you have a headache.  Keep a headache journal to find out what may trigger your migraine headaches. For example, write down:  What you eat and drink.  How much sleep you get.  Any change to your diet or medicines.  Try massage or other relaxation techniques.  Put ice packs or heat on the head and neck. Use these 3 to 4 times per day for 15 to 20 minutes each time, or as needed.  Limit stress.  Sit up straight, and do not tense your muscles.  Quit smoking if you smoke.  Limit alcohol use.  Decrease the amount of caffeine you drink, or stop drinking caffeine.  Eat and sleep on a regular schedule.  Get 7 to 9 hours of sleep, or as recommended by your caregiver.  Keep lights dim if bright lights bother you and make your headaches worse. SEEK MEDICAL CARE IF:   You have problems with the medicines you were prescribed.  Your medicines are not working.  You have a change from the usual headache.  You have nausea or vomiting. SEEK IMMEDIATE MEDICAL CARE IF:   Your headache becomes severe.  You have a fever.  You have a stiff neck.  You have loss of vision.  You have muscular weakness or loss of muscle control.  You start losing your balance or have trouble walking.  You feel  faint or pass out.  You have severe symptoms that are different from your first symptoms. MAKE SURE YOU:   Understand these instructions.  Will watch your condition.  Will get help right away if you are not doing well or get worse. Document Released: 02/28/2005 Document Revised: 05/23/2011 Document Reviewed: 03/16/2011 [facility] Patient Information 2015 [place], [place]. This information is not intended to replace advice given to you by your health care provider. Make sure you discuss any questions you have with your health care provider.

## 2014-04-01 NOTE — ED Notes (Signed)
Pt reports migraine since last night. Pain to top of left side of head and left side of mouth. Pt is a x 4.

## 2014-04-01 NOTE — ED Provider Notes (Signed)
CSN: [id number]     Arrival date & time 04/01/14  1236 History   First MD Initiated Contact with Patient 04/01/14 1303     Chief Complaint  Patient presents with  . Migraine     (Consider location/radiation/quality/duration/timing/severity/associated sxs/prior Treatment) Patient is a 39 y.o. female presenting with migraines. The history is provided by the patient.  Migraine This is a new problem. The current episode started yesterday. The problem occurs constantly. The problem has been gradually worsening. Associated symptoms include headaches. Pertinent negatives include no chest pain, no abdominal pain and no shortness of breath. Nothing aggravates the symptoms. She has tried nothing for the symptoms. The treatment provided no relief.    Past Medical History  Diagnosis Date  . Depression   . GERD (gastroesophageal reflux disease)   . Migraine   . Gout     ble  . Depression   . Migraine   . Anxiety    Past Surgical History  Procedure Laterality Date  . Cesarean section  infection at incission requiring return to OR x 2  . Tubal ligation     Family History  Problem Relation Age of Onset  . Anesthesia problems Neg Hx   . Hypotension Neg Hx   . Malignant hyperthermia Neg Hx   . Pseudochol deficiency Neg Hx   . Hypertension Mother   . Arthritis Sister    History  Substance Use Topics  . Smoking status: Current Every Day Smoker -- 0.25 packs/day for 20 years    Types: Cigarettes  . Smokeless tobacco: Never Used     Comment: Currently using Nicotine patch  . Alcohol Use: 0.0 oz/week     Comment: socially   OB History    Gravida Para Term Preterm AB TAB SAB Ectopic Multiple Living   3 3        3      Review of Systems  Respiratory: Negative for shortness of breath.   Cardiovascular: Negative for chest pain.  Gastrointestinal: Negative for abdominal pain.  Neurological: Positive for headaches.  All other systems reviewed and are negative.     Allergies  Review  of patient's allergies indicates no known allergies.  Home Medications   Prior to Admission medications   Medication Sig Start Date End Date Taking? Authorizing Provider  albuterol (PROVENTIL HFA;VENTOLIN HFA) 108 (90 BASE) MCG/ACT inhaler Inhale 1-2 puffs into the lungs every 4 (four) hours as needed for wheezing or shortness of breath (cough). 11/21/13   Carrie Mew, PA-C  benzonatate (TESSALON) 100 MG capsule Take 1 capsule (100 mg total) by mouth every 8 (eight) hours. 12/04/13   Malvin Johns, MD  buPROPion (WELLBUTRIN XL) 150 MG 24 hr tablet Take 1 tablet (150 mg total) by mouth daily. 06/08/13   Waylan Boga, NP  carbamazepine (TEGRETOL XR) 200 MG 12 hr tablet Take 1 tablet (200 mg total) by mouth 2 (two) times daily. Mood stabilization. 06/08/13   Waylan Boga, NP  citalopram (CELEXA) 20 MG tablet Take 20 mg by mouth daily.    Historical Provider, MD  feeding supplement, ENSURE COMPLETE, (ENSURE COMPLETE) LIQD Take 237 mLs by mouth 2 (two) times daily between meals. 06/08/13   Waylan Boga, NP  gabapentin (NEURONTIN) 100 MG capsule Take 1 capsule (100 mg total) by mouth 3 (three) times daily. Anxiety, neuropathic pain. 06/08/13   Waylan Boga, NP  loratadine (CLARITIN) 10 MG tablet Take 1 tablet (10 mg total) by mouth daily. One po daily x 5 days 11/21/13  Carrie Mew, PA-C  methocarbamol (ROBAXIN) 500 MG tablet Take 1 tablet (500 mg total) by mouth every 6 (six) hours as needed for muscle spasms. 06/08/13   Waylan Boga, NP  predniSONE (DELTASONE) 10 MG tablet Take 2 tablets (20 mg total) by mouth daily. 12/04/13   Malvin Johns, MD  risperiDONE (RISPERDAL) 1 MG tablet Take 1 tablet (1 mg total) by mouth at bedtime. 06/08/13   Waylan Boga, NP  traZODone (DESYREL) 100 MG tablet Take 1 tablet (100 mg total) by mouth at bedtime as needed for sleep. 06/08/13   Waylan Boga, NP   BP 130/94 mmHg  Pulse 75  Temp(Src) 98.8 F (37.1 C) (Oral)  Resp 16  SpO2 98%  LMP 03/18/2014 Physical Exam   Constitutional: She is oriented to person, place, and time. She appears well-developed and well-nourished. No distress.  HENT:  Head: Normocephalic and atraumatic.  Eyes: EOM are normal. Pupils are equal, round, and reactive to light.  There is no papilledema on funduscopic exam.  Neck: Normal range of motion. Neck supple.  Cardiovascular: Normal rate and regular rhythm.  Exam reveals no gallop and no friction rub.   No murmur heard. Pulmonary/Chest: Effort normal and breath sounds normal. No respiratory distress. She has no wheezes.  Abdominal: Soft. Bowel sounds are normal. She exhibits no distension. There is no tenderness.  Musculoskeletal: Normal range of motion.  Neurological: She is alert and oriented to person, place, and time. No cranial nerve deficit. She exhibits normal muscle tone. Coordination normal.  Skin: Skin is warm and dry. She is not diaphoretic.  Nursing note and vitals reviewed.   ED Course  Procedures (including critical care time) Labs Review Labs Reviewed - No data to display  Imaging Review No results found.   EKG Interpretation None      MDM   Final diagnoses:  None    Patient presents with complaints of headache since yesterday evening. He has tenderness to the left side of her head and face, however I see no significant swelling or obvious abnormality. She is now resting comfortably and feeling better after a migraine cocktail. I see no indication for further imaging or workup and feel as though she is appropriate to be discharged. I will prescribe her a few pain pills which she can take as needed if her headache returns. She is to follow up with her primary Dr. as needed.    Veryl Speak, MD 04/01/14 [phone]

## 2014-04-01 NOTE — ED Notes (Signed)
Patient was given a coke. 

## 2014-05-19 ENCOUNTER — Encounter (HOSPITAL_COMMUNITY): Payer: Self-pay | Admitting: *Deleted

## 2014-05-19 ENCOUNTER — Inpatient Hospital Stay (HOSPITAL_COMMUNITY)
Admission: AD | Admit: 2014-05-19 | Discharge: 2014-05-19 | Disposition: A | Discharge status: Home / Self Care | Payer: Self-pay | Source: Ambulatory Visit | Attending: Obstetrics & Gynecology | Admitting: Obstetrics & Gynecology

## 2014-05-19 DIAGNOSIS — D259 Leiomyoma of uterus, unspecified: Secondary | ICD-10-CM | POA: Insufficient documentation

## 2014-05-19 DIAGNOSIS — R109 Unspecified abdominal pain: Secondary | ICD-10-CM

## 2014-05-19 DIAGNOSIS — G629 Polyneuropathy, unspecified: Secondary | ICD-10-CM | POA: Insufficient documentation

## 2014-05-19 DIAGNOSIS — D500 Iron deficiency anemia secondary to blood loss (chronic): Secondary | ICD-10-CM

## 2014-05-19 DIAGNOSIS — N938 Other specified abnormal uterine and vaginal bleeding: Secondary | ICD-10-CM

## 2014-05-19 DIAGNOSIS — D649 Anemia, unspecified: Secondary | ICD-10-CM | POA: Insufficient documentation

## 2014-05-19 DIAGNOSIS — K219 Gastro-esophageal reflux disease without esophagitis: Secondary | ICD-10-CM | POA: Insufficient documentation

## 2014-05-19 LAB — CBC
HCT: 32.3 % — ABNORMAL LOW (ref 36.0–46.0)
Hemoglobin: 9.6 g/dL — ABNORMAL LOW (ref 12.0–15.0)
MCH: 22.7 pg — ABNORMAL LOW (ref 26.0–34.0)
MCHC: 29.7 g/dL — AB (ref 30.0–36.0)
MCV: 76.4 fL — ABNORMAL LOW (ref 78.0–100.0)
PLATELETS: 311 10*3/uL (ref 150–400)
RBC: 4.23 MIL/uL (ref 3.87–5.11)
RDW: 19.9 % — AB (ref 11.5–15.5)
WBC: 8.2 10*3/uL (ref 4.0–10.5)

## 2014-05-19 LAB — POCT PREGNANCY, URINE: PREG TEST UR: NEGATIVE

## 2014-05-19 LAB — URINE MICROSCOPIC-ADD ON

## 2014-05-19 LAB — URINALYSIS, ROUTINE W REFLEX MICROSCOPIC
Bilirubin Urine: NEGATIVE
Glucose, UA: NEGATIVE mg/dL
Ketones, ur: NEGATIVE mg/dL
LEUKOCYTES UA: NEGATIVE
NITRITE: NEGATIVE
PROTEIN: NEGATIVE mg/dL
Specific Gravity, Urine: 1.02 (ref 1.005–1.030)
UROBILINOGEN UA: 0.2 mg/dL (ref 0.0–1.0)
pH: 7 (ref 5.0–8.0)

## 2014-05-19 MED ORDER — KETOROLAC TROMETHAMINE 60 MG/2ML IM SOLN
60.0000 mg | Freq: Once | INTRAMUSCULAR | Status: AC
  Administered 2014-05-19: 60 mg via INTRAMUSCULAR
  Filled 2014-05-19: qty 2

## 2014-05-19 MED ORDER — MEDROXYPROGESTERONE ACETATE 10 MG PO TABS: 10.0000 mg | ORAL_TABLET | Freq: Every day | ORAL | Status: DC

## 2014-05-19 MED ORDER — IBUPROFEN 800 MG PO TABS: 800.0000 mg | ORAL_TABLET | Freq: Three times a day (TID) | ORAL | Status: DC

## 2014-05-19 NOTE — MAU Provider Note (Signed)
History     CSN: [id number]  Arrival date and time: 05/19/14 1009   First Provider Initiated Contact with Patient 05/19/14 1033      Chief Complaint  Patient presents with  . Vaginal Bleeding  . Abdominal Cramping   HPI  39 YOBF with history of anxiety, depression, peripheral neuropathy, uterine fibroids, and tubal ligation presents by EMS with 2 days of diffuse abdominal and pelvic pain that radiates to her back. Pain began acutely and has worsened. Pt is unable to localize pain. Pain is 9/10. She took 2 tablets of Midol PTA that did not alleviate the pain. LMP was 2 weeks ago but pt has hx of irregular periods so she put on a pad at onset of pain. Last night, around midnight, pt bled vaginally through 4 pads in a total of 30 minutes. She has not bled since. She is sexually active with one partner, with last encounter 1 week ago. Pt had tubal ligation 11 years ago after the birth of her third child. She reports hx of uterine fibroids 6 years ago but says they have "shrunk and gone away." She has been noncompliant to medications for 3 days because she is moving.  OB History    Gravida Para Term Preterm AB TAB SAB Ectopic Multiple Living   3 3        3       Past Medical History  Diagnosis Date  . Depression   . GERD (gastroesophageal reflux disease)   . Migraine   . Gout     ble  . Depression   . Migraine   . Anxiety     Past Surgical History  Procedure Laterality Date  . Cesarean section  infection at incission requiring return to OR x 2  . Tubal ligation      Family History  Problem Relation Age of Onset  . Anesthesia problems Neg Hx   . Hypotension Neg Hx   . Malignant hyperthermia Neg Hx   . Pseudochol deficiency Neg Hx   . Hypertension Mother   . Arthritis Sister     History  Substance Use Topics  . Smoking status: Current Every Day Smoker -- 0.25 packs/day for 20 years    Types: Cigarettes  . Smokeless tobacco: Never Used     Comment: Currently using  Nicotine patch  . Alcohol Use: 0.0 oz/week     Comment: socially    Allergies: No Known Allergies  Prescriptions prior to admission  Medication Sig Dispense Refill Last Dose  . Acetaminophen-Caff-Pyrilamine (MIDOL COMPLETE PO) Take 2 tablets by mouth daily as needed (crampoing).   05/19/2014 at Unknown time  . albuterol (PROVENTIL HFA;VENTOLIN HFA) 108 (90 BASE) MCG/ACT inhaler Inhale 1-2 puffs into the lungs every 4 (four) hours as needed for wheezing or shortness of breath (cough). 1 Inhaler 0 Past Week at Unknown time  . carbamazepine (TEGRETOL XR) 200 MG 12 hr tablet Take 1 tablet (200 mg total) by mouth 2 (two) times daily. Mood stabilization. 60 tablet 0 Past Week at Unknown time  . citalopram (CELEXA) 20 MG tablet Take 20 mg by mouth daily.   Past Week at Unknown time  . gabapentin (NEURONTIN) 100 MG capsule Take 1 capsule (100 mg total) by mouth 3 (three) times daily. Anxiety, neuropathic pain. 90 capsule 0 Past Week at Unknown time  . risperiDONE (RISPERDAL) 1 MG tablet Take 1 tablet (1 mg total) by mouth at bedtime. 30 tablet 0 Past Week at Unknown time  .  traZODone (DESYREL) 100 MG tablet Take 1 tablet (100 mg total) by mouth at bedtime as needed for sleep. 30 tablet 0 Past Week at Unknown time  . benzonatate (TESSALON) 100 MG capsule Take 1 capsule (100 mg total) by mouth every 8 (eight) hours. (Patient not taking: Reported on 05/19/2014) 21 capsule 0   . buPROPion (WELLBUTRIN XL) 150 MG 24 hr tablet Take 1 tablet (150 mg total) by mouth daily. (Patient not taking: Reported on 05/19/2014) 30 tablet 0   . feeding supplement, ENSURE COMPLETE, (ENSURE COMPLETE) LIQD Take 237 mLs by mouth 2 (two) times daily between meals. (Patient not taking: Reported on 05/19/2014) 30 Bottle 0   . HYDROcodone-acetaminophen (NORCO) 5-325 MG per tablet Take 1-2 tablets by mouth every 6 (six) hours as needed. (Patient not taking: Reported on 05/19/2014) 6 tablet 0   . loratadine (CLARITIN) 10 MG tablet Take 1 tablet  (10 mg total) by mouth daily. One po daily x 5 days (Patient not taking: Reported on 05/19/2014) 5 tablet 0   . methocarbamol (ROBAXIN) 500 MG tablet Take 1 tablet (500 mg total) by mouth every 6 (six) hours as needed for muscle spasms. (Patient not taking: Reported on 05/19/2014) 30 tablet 0   . predniSONE (DELTASONE) 10 MG tablet Take 2 tablets (20 mg total) by mouth daily. (Patient not taking: Reported on 05/19/2014) 10 tablet 0    Results for orders placed or performed during the hospital encounter of 05/19/14 (from the past 48 hour(s))  Urinalysis, Routine w reflex microscopic     Status: Abnormal   Collection Time: 05/19/14 10:50 AM  Result Value Ref Range   Color, Urine YELLOW YELLOW   APPearance CLEAR CLEAR   Specific Gravity, Urine 1.020 1.005 - 1.030   pH 7.0 5.0 - 8.0   Glucose, UA NEGATIVE NEGATIVE mg/dL   Hgb urine dipstick MODERATE (A) NEGATIVE   Bilirubin Urine NEGATIVE NEGATIVE   Ketones, ur NEGATIVE NEGATIVE mg/dL   Protein, ur NEGATIVE NEGATIVE mg/dL   Urobilinogen, UA 0.2 0.0 - 1.0 mg/dL   Nitrite NEGATIVE NEGATIVE   Leukocytes, UA NEGATIVE NEGATIVE  Urine microscopic-add on     Status: Abnormal   Collection Time: 05/19/14 10:50 AM  Result Value Ref Range   Squamous Epithelial / LPF FEW (A) RARE   WBC, UA 0-2 <3 WBC/hpf   RBC / HPF 3-6 <3 RBC/hpf   Bacteria, UA RARE RARE  Pregnancy, urine POC     Status: None   Collection Time: 05/19/14 11:04 AM  Result Value Ref Range   Preg Test, Ur NEGATIVE NEGATIVE    Comment:        THE SENSITIVITY OF THIS METHODOLOGY IS >24 mIU/mL   CBC     Status: Abnormal   Collection Time: 05/19/14 11:40 AM  Result Value Ref Range   WBC 8.2 4.0 - 10.5 K/uL   RBC 4.23 3.87 - 5.11 MIL/uL   Hemoglobin 9.6 (L) 12.0 - 15.0 g/dL   HCT 32.3 (L) 36.0 - 46.0 %   MCV 76.4 (L) 78.0 - 100.0 fL   MCH 22.7 (L) 26.0 - 34.0 pg   MCHC 29.7 (L) 30.0 - 36.0 g/dL   RDW 19.9 (H) 11.5 - 15.5 %   Platelets 311 150 - 400 K/uL    Review of Systems   Constitutional: Negative for fever, chills and weight loss.  Respiratory: Negative for cough.   Cardiovascular: Negative for chest pain and palpitations.  Gastrointestinal: Positive for nausea and abdominal pain. Negative  for heartburn, vomiting, diarrhea, constipation and blood in stool.  Genitourinary: Negative for dysuria and urgency.       Admits to stress incontinence  Skin: Negative for itching and rash.  Neurological: Negative for dizziness, tingling, loss of consciousness and headaches.  Psychiatric/Behavioral: Positive for depression. Negative for suicidal ideas. The patient is nervous/anxious.    Physical Exam   Blood pressure 151/88, pulse 74, temperature 98.4 F (36.9 C), resp. rate 18, last menstrual period 04/28/2014.  Physical Exam  Constitutional: She is oriented to person, place, and time. She appears well-developed and well-nourished. She appears distressed.  Tearful due to pain  HENT:  Head: Normocephalic and atraumatic.  Right Ear: External ear normal.  Left Ear: External ear normal.  Nose: Nose normal.  Eyes: EOM are normal.  Neck: Normal range of motion. Neck supple.  Cardiovascular: Normal rate, regular rhythm, normal heart sounds and intact distal pulses.   Respiratory: Effort normal and breath sounds normal.  GI: Soft. Bowel sounds are normal. She exhibits distension. She exhibits no mass. There is no tenderness. There is no rebound and no guarding.  Genitourinary: Vagina normal. No vaginal discharge found.  Cervix is pink. Uterus is anterior, midline and slightly enlarged. Adnexa not felt.  Scant amount of bright red, mucoid discharge.   Neurological: She is alert and oriented to person, place, and time. No cranial nerve deficit.  Skin: Skin is warm and dry. She is not diaphoretic. No erythema.  Psychiatric: She has a normal mood and affect. Her behavior is normal.    MAU Course  Procedures  None  MDM CBC  78 YOBF with hx of uterine fibroids and  tubal ligation presents via EMS with 2 days of abdominal, pelvic, and back pain. Also had vaginal bleeding through 4 pads in 30 minutes last night. Sexually active. LMP 2 weeks ago. Physical exam WNL. UPT negative. Will give Toradol IM for pain. Return to ED if symptoms persist or worsen.  Assessment and Plan   A:  DUB Anemia Abdominal pain in female   P:  Discharge home in stable condition Pelvic US ordered out patient RX: Proveria 10 mg for 5 days         Ibuprofen 800mg  Bleeding precautions  Refer to [facility]; message sent.   Andree Moro 05/19/2014, 10:48 AM     PA student attestation:  I have seen and examined this patient; I agree with above documentation in the Pa students note.   Jaelie CALLAHAN PEDDIE is a 39 y.o. G3P3 reporting vaginal bleeding and abdominal pain with a history of uterine fibroids    PE: BP 151/88 mmHg  Pulse 74  Temp(Src) 98.4 F (36.9 C)  Resp 18  LMP 04/28/2014 Gen: calm comfortable, NAD Resp: normal effort, no distress Abd: soft, non tender  GU: minimal bleeding on exam   ROS, labs, PMH reviewed   Plan: As stated above.   Lezlie Lye, NP 05/19/2014 12:51 PM

## 2014-05-19 NOTE — MAU Note (Signed)
Pt presents to MAU via EMS with complaints of abdominal pain with heavy bleeding that started last night.

## 2014-05-23 ENCOUNTER — Ambulatory Visit (HOSPITAL_COMMUNITY)
Admission: RE | Admit: 2014-05-23 | Discharge: 2014-05-23 | Disposition: A | Discharge status: Home / Self Care | Payer: Medicaid Other | Source: Ambulatory Visit | Attending: Obstetrics and Gynecology | Admitting: Obstetrics and Gynecology

## 2014-05-23 ENCOUNTER — Other Ambulatory Visit (HOSPITAL_COMMUNITY): Payer: Self-pay | Admitting: Obstetrics and Gynecology

## 2014-05-23 DIAGNOSIS — D500 Iron deficiency anemia secondary to blood loss (chronic): Secondary | ICD-10-CM | POA: Insufficient documentation

## 2014-05-23 DIAGNOSIS — N938 Other specified abnormal uterine and vaginal bleeding: Secondary | ICD-10-CM

## 2014-05-23 DIAGNOSIS — R109 Unspecified abdominal pain: Secondary | ICD-10-CM | POA: Insufficient documentation

## 2014-05-29 ENCOUNTER — Inpatient Hospital Stay (HOSPITAL_COMMUNITY)
Admission: AD | Admit: 2014-05-29 | Discharge: 2014-05-29 | Disposition: A | Discharge status: Home / Self Care | Payer: Self-pay | Source: Ambulatory Visit | Attending: Family Medicine | Admitting: Family Medicine

## 2014-05-29 ENCOUNTER — Other Ambulatory Visit (HOSPITAL_COMMUNITY)
Admission: RE | Admit: 2014-05-29 | Discharge: 2014-05-29 | Disposition: A | Discharge status: Home / Self Care | Payer: Self-pay | Source: Ambulatory Visit | Attending: Family Medicine | Admitting: Family Medicine

## 2014-05-29 ENCOUNTER — Ambulatory Visit (INDEPENDENT_AMBULATORY_CARE_PROVIDER_SITE_OTHER): Payer: Self-pay | Admitting: Family Medicine

## 2014-05-29 ENCOUNTER — Encounter (HOSPITAL_COMMUNITY): Payer: Self-pay | Admitting: *Deleted

## 2014-05-29 ENCOUNTER — Encounter: Payer: Self-pay | Admitting: Family Medicine

## 2014-05-29 VITALS — BP 159/95 | HR 67 | Temp 98.3°F | Ht 65.0 in | Wt 186.3 lb

## 2014-05-29 DIAGNOSIS — N938 Other specified abnormal uterine and vaginal bleeding: Secondary | ICD-10-CM | POA: Insufficient documentation

## 2014-05-29 DIAGNOSIS — N939 Abnormal uterine and vaginal bleeding, unspecified: Secondary | ICD-10-CM

## 2014-05-29 DIAGNOSIS — K219 Gastro-esophageal reflux disease without esophagitis: Secondary | ICD-10-CM | POA: Insufficient documentation

## 2014-05-29 DIAGNOSIS — IMO0001 Reserved for inherently not codable concepts without codable children: Secondary | ICD-10-CM

## 2014-05-29 DIAGNOSIS — Z3202 Encounter for pregnancy test, result negative: Secondary | ICD-10-CM

## 2014-05-29 DIAGNOSIS — F17210 Nicotine dependence, cigarettes, uncomplicated: Secondary | ICD-10-CM | POA: Insufficient documentation

## 2014-05-29 DIAGNOSIS — R030 Elevated blood-pressure reading, without diagnosis of hypertension: Secondary | ICD-10-CM | POA: Insufficient documentation

## 2014-05-29 DIAGNOSIS — R109 Unspecified abdominal pain: Secondary | ICD-10-CM | POA: Insufficient documentation

## 2014-05-29 DIAGNOSIS — R1030 Lower abdominal pain, unspecified: Secondary | ICD-10-CM

## 2014-05-29 LAB — URINALYSIS, ROUTINE W REFLEX MICROSCOPIC
Bilirubin Urine: NEGATIVE
Glucose, UA: NEGATIVE mg/dL
Ketones, ur: NEGATIVE mg/dL
Nitrite: NEGATIVE
PROTEIN: NEGATIVE mg/dL
Specific Gravity, Urine: 1.02 (ref 1.005–1.030)
Urobilinogen, UA: 0.2 mg/dL (ref 0.0–1.0)
pH: 6.5 (ref 5.0–8.0)

## 2014-05-29 LAB — CBC
HEMATOCRIT: 33 % — AB (ref 36.0–46.0)
Hemoglobin: 10 g/dL — ABNORMAL LOW (ref 12.0–15.0)
MCH: 23 pg — ABNORMAL LOW (ref 26.0–34.0)
MCHC: 30.3 g/dL (ref 30.0–36.0)
MCV: 76 fL — ABNORMAL LOW (ref 78.0–100.0)
PLATELETS: 321 10*3/uL (ref 150–400)
RBC: 4.34 MIL/uL (ref 3.87–5.11)
RDW: 19.8 % — AB (ref 11.5–15.5)
WBC: 11.1 10*3/uL — ABNORMAL HIGH (ref 4.0–10.5)

## 2014-05-29 LAB — RAPID URINE DRUG SCREEN, HOSP PERFORMED
Amphetamines: NOT DETECTED
BENZODIAZEPINES: NOT DETECTED
Barbiturates: NOT DETECTED
COCAINE: NOT DETECTED
Opiates: NOT DETECTED
Tetrahydrocannabinol: POSITIVE — AB

## 2014-05-29 LAB — URINE MICROSCOPIC-ADD ON

## 2014-05-29 LAB — POCT PREGNANCY, URINE: Preg Test, Ur: NEGATIVE

## 2014-05-29 LAB — TSH: TSH: 1.24 u[IU]/mL (ref 0.350–4.500)

## 2014-05-29 MED ORDER — OXYCODONE-ACETAMINOPHEN 5-325 MG PO TABS: 1.0000 | ORAL_TABLET | Freq: Four times a day (QID) | ORAL | Status: DC | PRN

## 2014-05-29 MED ORDER — KETOROLAC TROMETHAMINE 60 MG/2ML IM SOLN
60.0000 mg | Freq: Once | INTRAMUSCULAR | Status: AC
  Administered 2014-05-29: 60 mg via INTRAMUSCULAR
  Filled 2014-05-29: qty 2

## 2014-05-29 NOTE — Progress Notes (Signed)
Bleeding for 2-3 weeks.  Pelvic US shows no fibroids.  ENDOMETRIAL BIOPSY     The indications for endometrial biopsy were reviewed.   Risks of the biopsy including cramping, bleeding, infection, uterine perforation, inadequate specimen and need for additional procedures  were discussed. The patient states she understands and agrees to undergo procedure today. Consent was signed. Time out was performed. Urine HCG was negative. A sterile speculum was placed in the patient's vagina and the cervix was prepped with Betadine. A single-toothed tenaculum was placed on the anterior lip of the cervix to stabilize it. The 3 mm pipelle was introduced into the endometrial cavity without difficulty to a depth of 7cm, and a moderate amount of tissue was obtained and sent to pathology. The instruments were removed from the patient's vagina. Minimal bleeding from the cervix was noted. The patient tolerated the procedure well. Routine post-procedure instructions were given to the patient. The patient will follow up to review the results and for further management.    Will get TSH, HgA1c.

## 2014-05-29 NOTE — MAU Note (Signed)
Pt states she was in today at the clinic to have a biopsy. Pt states she is having a lot pain since that procedure

## 2014-05-29 NOTE — Progress Notes (Signed)
Pt states she has abdominal pain and pressure to the vagina

## 2014-05-29 NOTE — Discharge Instructions (Signed)
Endometrial Biopsy, Care After Refer to this sheet in the next few weeks. These instructions provide you with information on caring for yourself after your procedure. Your health care provider may also give you more specific instructions. Your treatment has been planned according to current medical practices, but problems sometimes occur. Call your health care provider if you have any problems or questions after your procedure. WHAT TO EXPECT AFTER THE PROCEDURE After your procedure, it is typical to have the following:  You may have mild cramping and a small amount of vaginal bleeding for a few days after the procedure. This is normal. HOME CARE INSTRUCTIONS  Only take over-the-counter or prescription medicine as directed by your health care provider.  Do not douche, use tampons, or have sexual intercourse until your health care provider approves.  Follow your health care provider's instructions regarding any activity restrictions, such as strenuous exercise or heavy lifting. SEEK MEDICAL CARE IF:  You have heavy bleeding or bleeding longer than 2 days after the procedure.  You have bad smelling drainage from your vagina.  You have a fever and chills.  Youhave severe lower stomach (abdominal) pain. SEEK IMMEDIATE MEDICAL CARE IF:  You have severe cramps in your stomach or back.  You pass large blood clots.  Your bleeding increases.  You become weak or lightheaded, or you pass out. Document Released: 12/19/2012 Document Reviewed: 12/19/2012 [facility] Patient Information 2015 [place], [place]. This information is not intended to replace advice given to you by your health care provider. Make sure you discuss any questions you have with your health care provider.   [place] (Revised August 2014)   Chronic Pain Problems:   [facility]:  [phone]           Patients need to be referred by their primary care  doctor/specialist  Insufficient Money for Medicine:           United Way: call "211"    MAP Program at [place] or HP [phone]            No Primary Care Doctor:  To locate a primary care doctor that accepts your insurance or provides certain services:           [place]: [phone]           Physician Referral Service: [phone] ask for My [place]  If no insurance, you need to see if you qualify for [facility] orange card, call to set      up appointment for eligibility/enrollment at [phone] or [phone] or visit [place] ([address], [place] and [place]) to meet with a [facility] enrollment specialist.  Agencies that provide inexpensive (sliding fee scale) medical care:       [facility] - Family Medicine at [place] - [phone]     [facility] [place] [facility] - [place] - [place] for Children - [place] [phone]  [facility] - [place] @ [place] [phone]-     [phone]  [facility] - [place] @ [place] - [phone]  [facility]: [phone]  Women's Clinic: [phone]   Planned Parenthood: [phone]   Family [place] [phone]    [place] Providers:           [facility] [phone] (No Family Planning accepted)          [address], [phone], Mon-Fri 9am-5pm          [place] [phone]  [place], [place], [place] 8am-5pm, Fri 8am-noon  [place]          [address], [address], Mon-Fri 7:30am-4:30pm          [place] - [phone]          [address] [facility] - [phone] [address]          Only accepts [employer] patients after they have their name applied to their card  Self Pay (no insurance) in [facility]:           Sickle Cell Patients:   [place], [phone] [facility] Internal [facility]:  [address] [place] [phone]       [facility] Well[facility]  [address], [place] [phone]  [facility]:  [address] [phone]          [facility] Urgent Care           [place], [phone] [facility] for [place], [phone]           [facility] Urgent [place]           [place] [address], [place]        [facility] - [address]           [phone], Mon-Fri 9am-7pm, [place] 9am-1pm          [facility] - Family Medicine @ [facility]          [place], [place]          [facility] - [facility]           [address] [place] [facility] [place]  [address], [place] [phone]          [place], [place]  [facility] - [place]   [place], [phone] [facility] - [facility]  [address], [phone]  Dr. Vista Lawman           [address], [place], [facility]           [address] [phone]          [facility]             [address] [phone]  [facility]            [place], [place], 1st & 3rd Saturday every month, 10am-1pm  OTHERS:  [place]  [employer] Only)  [phone] (Thursday only)  Strategies for finding a Primary Care Provider:  1) Find a Doctor and Pay Out of Pocket  Although you won't have to find out who is covered by your insurance plan, it is a good idea to ask around and get recommendations. You will then need to call the office and see if the doctor you have chosen will accept you as a new patient and what types of options they offer for patients who are self-pay. Some doctors offer discounts or will set up payment plans for their patients who do not have insurance, but you will need to ask so you aren't surprised when you get to your appointment.  2) [place] - To see if you qualify for orange card access to healthcare safety net providers.  Call for appointment for eligibility/enrollment at [phone] or [phone]. (Uninsured, 0-200% FPL, qualifying info)  Applicants for [facility] are first required to see if they are eligible to enroll in the [facility] Marketplace before enrolling in [facility] (and get an exemption if they are not).  GCCN Criteria for acceptance is:  ? Proof of [employer] exemption - form or documentation  ? Valid photo ID (driver's license, state identification card, passport, home country ID)  ? Proof of [employer] residency (e.g. drivers license, lease/landlord information, pay stubs with address, utility bill, bank statement, etc.)  ? Proof of income (1040, last year's tax return, W2, 4 current pay stubs, other income proof)  ? Proof of assets (current bank statement + 3 most recent, disability paperwork, life insurance info, tax value on autos, etc.)  3) [place] Department  Not all health departments have doctors that can see patients for sick visits, but many do, so it is worth a call to see if yours does. If you don't know where your  local health department is, you can check in your phone book. The CDC also has a tool to help you locate your state's health department, and many state websites also have listings of all of their local health departments.  4) Find a [place] [facility]  If your illness is not likely to be very severe or complicated, you may want to try a walk in clinic. These are popping up all over the country in pharmacies, drugstores, and shopping centers. They're usually staffed by nurse practitioners or physician assistants that have been trained to treat common illnesses and complaints. They're usually fairly quick and inexpensive. However, if you have serious medical issues or chronic medical problems, these are probably not your best option   STD Testing:           [place], [facility]           [address] [place], phone [phone] or [phone]           Monday - Friday, call for an appointment          [place], [facility]           [address], [place], phone [phone] or [phone]           Monday - Friday, call for an appointment  Register LINE: [phone]     (24 Hour)  Mobile Crisis:   [place]:  Radio producer on [place] [phone] [facility] on [place] [phone]  Walk In [place]  ([place] - [phone] or [phone]  [place]. [place] [phone]  [place] [address], [place] [phone]

## 2014-05-29 NOTE — MAU Note (Signed)
PT  SAYS [place]  U/S RESULTS  FROM  Tuesday .  GAVE  HER   BX  FROM UTERUS.  HAS BEEN HURTING  WORSE  SINCE  THEN .  THEY RX IBUPROFEN  800 MG-  TOOK AT 330PM-   NO RELIEF-   STILL HAS PAIN  IN ABD .   SAYS HAS PRESSURE IN VAGINA  SINCE HER BX.   SAYS VAG BLEEDING  STARTED  ON 3-7- AND HAS CONTINUED- STARTED PROVERA-  STOPPED X3 DAYS   THEN STARTED  AGAIN.      IN TRIAGE  - PAD ON -      SMALL AMT  LIGHT RED.

## 2014-05-29 NOTE — MAU Provider Note (Signed)
CC: Abdominal Pain    First Provider Initiated Contact with Patient 05/29/14 2031      HPI Gina Shelton is a 39 y.o. G3P3 who presents with onset  of constant, sharp suprapubic abdominal pain and pelvic pressure starting shortly after her endometrial biopsy done at [facility] today. The pain is constant but waxes and wanes. It is sharper during urination. It is ssociated with pelvic pressure and greater on right than left.   She has been having some degree of abdominal  pain since her abnormal bleeding episode beginning about 05/17/2014. She denies dysuria but has some urgency of urination.She was evaluated in MAU on 05/19/2014 and noted to have scant bleeding and  She states the bleeding is continuous. Uses a few pads per day. Feels weak, not dizzy. States she has not used cocaine since March. Unaware of hypertension in the past. No PCP.  Past Medical History  Diagnosis Date  . Depression   . GERD (gastroesophageal reflux disease)   . Migraine   . Gout     ble  . Depression   . Migraine   . Anxiety     OB History  Gravida Para Term Preterm AB SAB TAB Ectopic Multiple Living  3 3        3     # Outcome Date GA Lbr Len/2nd Weight Sex Delivery Anes PTL Lv  3 Para      CS-LTranv     2 Para      CS-LTranv     1 Para      Vag-Spont         Past Surgical History  Procedure Laterality Date  . Cesarean section  infection at incission requiring return to OR x 2  . Tubal ligation      History   Social History  . Marital Status: Single    Spouse Name: N/A  . Number of Children: N/A  . Years of Education: N/A   Occupational History  . Not on file.   Social History Main Topics  . Smoking status: Current Every Day Smoker -- 0.25 packs/day for 20 years    Types: Cigarettes  . Smokeless tobacco: Never Used     Comment: Currently using Nicotine patch  . Alcohol Use: 0.0 oz/week     Comment: socially  . Drug Use: No  . Sexual Activity: Yes    Birth Control/ Protection: Surgical    Other Topics Concern  . Not on file   Social History Narrative    No current facility-administered medications on file prior to encounter.   Current Outpatient Prescriptions on File Prior to Encounter  Medication Sig Dispense Refill  . Acetaminophen-Caff-Pyrilamine (MIDOL COMPLETE PO) Take 2 tablets by mouth daily as needed (crampoing).    Marland Kitchen albuterol (PROVENTIL HFA;VENTOLIN HFA) 108 (90 BASE) MCG/ACT inhaler Inhale 1-2 puffs into the lungs every 4 (four) hours as needed for wheezing or shortness of breath (cough). 1 Inhaler 0  . carbamazepine (TEGRETOL XR) 200 MG 12 hr tablet Take 1 tablet (200 mg total) by mouth 2 (two) times daily. Mood stabilization. 60 tablet 0  . citalopram (CELEXA) 20 MG tablet Take 20 mg by mouth daily.    Marland Kitchen gabapentin (NEURONTIN) 100 MG capsule Take 1 capsule (100 mg total) by mouth 3 (three) times daily. Anxiety, neuropathic pain. 90 capsule 0  . ibuprofen (ADVIL,MOTRIN) 800 MG tablet Take 1 tablet (800 mg total) by mouth 3 (three) times daily. 21 tablet 0  . medroxyPROGESTERone (PROVERA) 10  MG tablet Take 1 tablet (10 mg total) by mouth daily. 5 tablet 0  . risperiDONE (RISPERDAL) 1 MG tablet Take 1 tablet (1 mg total) by mouth at bedtime. 30 tablet 0  . traZODone (DESYREL) 100 MG tablet Take 1 tablet (100 mg total) by mouth at bedtime as needed for sleep. 30 tablet 0    No Known Allergies   ROS Pertinent items in HPI. Denies irritative vaginal discharge. Denies N/V/diarrhea/constipation   PHYSICAL EXAM Filed Vitals:   05/29/14 1921  BP: 152/92  Pulse: 69  Temp: 98.9 F (37.2 C)  Resp: 20   General: Obese female in no acute distress Cardiovascular: Normal rate Respiratory: Normal effort Abdomen: Soft, obese minimally tender to deep palpation suprapubic region, no guarding or reboundtender Back: No CVAT Extremities: No edema Neurologic:grossly intact Peripad 1 spot dried blood   LAB RESULTS Results for orders placed or performed during  the hospital encounter of 05/29/14 (from the past 24 hour(s))  Urinalysis, Routine w reflex microscopic     Status: Abnormal   Collection Time: 05/29/14  9:25 PM  Result Value Ref Range   Color, Urine YELLOW YELLOW   APPearance HAZY (A) CLEAR   Specific Gravity, Urine 1.020 1.005 - 1.030   pH 6.5 5.0 - 8.0   Glucose, UA NEGATIVE NEGATIVE mg/dL   Hgb urine dipstick LARGE (A) NEGATIVE   Bilirubin Urine NEGATIVE NEGATIVE   Ketones, ur NEGATIVE NEGATIVE mg/dL   Protein, ur NEGATIVE NEGATIVE mg/dL   Urobilinogen, UA 0.2 0.0 - 1.0 mg/dL   Nitrite NEGATIVE NEGATIVE   Leukocytes, UA SMALL (A) NEGATIVE  Urine rapid drug screen (hosp performed)     Status: Abnormal   Collection Time: 05/29/14  9:25 PM  Result Value Ref Range   Opiates NONE DETECTED NONE DETECTED   Cocaine NONE DETECTED NONE DETECTED   Benzodiazepines NONE DETECTED NONE DETECTED   Amphetamines NONE DETECTED NONE DETECTED   Tetrahydrocannabinol POSITIVE (A) NONE DETECTED   Barbiturates NONE DETECTED NONE DETECTED  Urine microscopic-add on     Status: Abnormal   Collection Time: 05/29/14  9:25 PM  Result Value Ref Range   Squamous Epithelial / LPF FEW (A) RARE   WBC, UA 7-10 <3 WBC/hpf   RBC / HPF 21-50 <3 RBC/hpf   Bacteria, UA FEW (A) RARE   Urine-Other MUCOUS PRESENT   CBC     Status: Abnormal   Collection Time: 05/29/14  9:32 PM  Result Value Ref Range   WBC 11.1 (H) 4.0 - 10.5 K/uL   RBC 4.34 3.87 - 5.11 MIL/uL   Hemoglobin 10.0 (L) 12.0 - 15.0 g/dL   HCT 33.0 (L) 36.0 - 46.0 %   MCV 76.0 (L) 78.0 - 100.0 fL   MCH 23.0 (L) 26.0 - 34.0 pg   MCHC 30.3 30.0 - 36.0 g/dL   RDW 19.8 (H) 11.5 - 15.5 %   Platelets 321 150 - 400 K/uL   CBC Latest Ref Rng 05/29/2014 05/19/2014 05/30/2013  WBC 4.0 - 10.5 K/uL 11.1(H) 8.2 6.1  Hemoglobin 12.0 - 15.0 g/dL 10.0(L) 9.6(L) 10.9(L)  Hematocrit 36.0 - 46.0 % 33.0(L) 32.3(L) 33.7(L)  Platelets 150 - 400 K/uL 321 311 341     IMAGING US Transvaginal Non-ob  05/23/2014    CLINICAL DATA:  Abdominal pain in female. Pelvic and back pain. Dysfunctional uterine bleeding. Iron deficiency anemia. LMP 04/28/2014.  EXAM: TRANSABDOMINAL AND TRANSVAGINAL ULTRASOUND OF PELVIS  TECHNIQUE: Both transabdominal and transvaginal ultrasound examinations of the pelvis were performed.  Transabdominal technique was performed for global imaging of the pelvis including uterus, ovaries, adnexal regions, and pelvic cul-de-sac. It was necessary to proceed with endovaginal exam following the transabdominal exam to visualize the endometrial stripe and ovaries.  COMPARISON:  04/19/2011  FINDINGS: Uterus  Measurements: 9.2 x 4.9 x 7.0 cm. Retroverted. No definite fibroids or other mass visualized.  Endometrium  Thickness: 9 mm.  No focal abnormality visualized.  Right ovary  Measurements: 3.5 x 1.4 x 2.4 cm. Normal appearance/no adnexal mass.  Left ovary  Measurements: 3.9 x 2.2 x 2.7 cm. Normal appearance/no adnexal mass.  Other findings  No free fluid.  IMPRESSION: No fibroids identified. Endometrial thickness measures 9 mm. If bleeding remains unresponsive to hormonal or medical therapy, sonohysterogram should be considered for focal lesion work-up. (Ref: Radiological Reasoning: Algorithmic Workup of Abnormal Vaginal Bleeding with Endovaginal Sonography and Sonohysterography. AJR 2008; 967:E93-81).  Normal appearance of both ovaries.  No adnexal mass identified.   Electronically Signed   By: Earle Gell M.D.   On: 05/23/2014 12:18   US Pelvis Complete  05/23/2014   CLINICAL DATA:  Abdominal pain in female. Pelvic and back pain. Dysfunctional uterine bleeding. Iron deficiency anemia. LMP 04/28/2014.  EXAM: TRANSABDOMINAL AND TRANSVAGINAL ULTRASOUND OF PELVIS  TECHNIQUE: Both transabdominal and transvaginal ultrasound examinations of the pelvis were performed. Transabdominal technique was performed for global imaging of the pelvis including uterus, ovaries, adnexal regions, and pelvic cul-de-sac. It was  necessary to proceed with endovaginal exam following the transabdominal exam to visualize the endometrial stripe and ovaries.  COMPARISON:  04/19/2011  FINDINGS: Uterus  Measurements: 9.2 x 4.9 x 7.0 cm. Retroverted. No definite fibroids or other mass visualized.  Endometrium  Thickness: 9 mm.  No focal abnormality visualized.  Right ovary  Measurements: 3.5 x 1.4 x 2.4 cm. Normal appearance/no adnexal mass.  Left ovary  Measurements: 3.9 x 2.2 x 2.7 cm. Normal appearance/no adnexal mass.  Other findings  No free fluid.  IMPRESSION: No fibroids identified. Endometrial thickness measures 9 mm. If bleeding remains unresponsive to hormonal or medical therapy, sonohysterogram should be considered for focal lesion work-up. (Ref: Radiological Reasoning: Algorithmic Workup of Abnormal Vaginal Bleeding with Endovaginal Sonography and Sonohysterography. AJR 2008; 017:P10-25).  Normal appearance of both ovaries.  No adnexal mass identified.   Electronically Signed   By: Earle Gell M.D.   On: 05/23/2014 12:18    MAU COURSE Toradol 60mg  IM Labs pending. Care assumed by Kerry Hough, PA at 2100 Gina Shelton, CNM 05/29/2014 9:12 PM  2100 - care assumed from Candis Musa, [place]. Labs pending.  Patient reports improvement in pain to 5/10 with Toradol.  Patient denies headache,  Blurred vision, floaters, chest pain or numbness/change in sensation of the extremities.  ASSESSMENT  1. DUB (dysfunctional uterine bleeding)   2. Abdominal pain, suprapubic, unspecified laterality   3. Elevated BP     PLAN Discharge home. See AVS for patient education. Rx for Percocet given to patient Bleeding precautions discussed Patient advised to follow-up with [facility] for results and management as planned Discussed warning signs for elevated blood pressure/CVA. Patient advised to seek immediate [facility] at [facility] if symptoms arise Patient may return to MAU as needed or if her condition were to change or worsen      Medication List    ASK your doctor about these medications        albuterol 108 (90 BASE) MCG/ACT inhaler  Commonly known as:  PROVENTIL HFA;VENTOLIN HFA  Inhale 1-2  puffs into the lungs every 4 (four) hours as needed for wheezing or shortness of breath (cough).     carbamazepine 200 MG 12 hr tablet  Commonly known as:  TEGRETOL XR  Take 1 tablet (200 mg total) by mouth 2 (two) times daily. Mood stabilization.     citalopram 20 MG tablet  Commonly known as:  CELEXA  Take 20 mg by mouth daily.     gabapentin 100 MG capsule  Commonly known as:  NEURONTIN  Take 1 capsule (100 mg total) by mouth 3 (three) times daily. Anxiety, neuropathic pain.     ibuprofen 800 MG tablet  Commonly known as:  ADVIL,MOTRIN  Take 1 tablet (800 mg total) by mouth 3 (three) times daily.     medroxyPROGESTERone 10 MG tablet  Commonly known as:  PROVERA  Take 1 tablet (10 mg total) by mouth daily.     MIDOL COMPLETE PO  Take 2 tablets by mouth daily as needed (crampoing).     risperiDONE 1 MG tablet  Commonly known as:  RISPERDAL  Take 1 tablet (1 mg total) by mouth at bedtime.     traZODone 100 MG tablet  Commonly known as:  DESYREL  Take 1 tablet (100 mg total) by mouth at bedtime as needed for sleep.        Luvenia Redden, PA-C  05/29/2014 10:10 PM

## 2014-05-30 LAB — HEMOGLOBIN A1C
Hgb A1c MFr Bld: 5.4 % (ref <5.7)
MEAN PLASMA GLUCOSE: 108 mg/dL (ref <117)

## 2014-05-31 LAB — URINE CULTURE: Colony Count: 30000

## 2014-06-23 ENCOUNTER — Telehealth: Payer: Self-pay | Admitting: General Practice

## 2014-06-23 NOTE — Telephone Encounter (Signed)
-----   Message from Truett Mainland, DO sent at 06/20/2014 11:41 AM EDT ----- Her endometrial biopsy came back negative.  I have unsuccessfully tried to call her a couple of times to discuss how she would like to treat her bleeding - medication, IUD, surgery.  Can you see if you can get a hold of her?  Thanks!

## 2014-06-23 NOTE — Telephone Encounter (Signed)
Called patient and informed her of results and different options for treatment. Patient would like to come in and see a doctor to discuss options, does not have insurance. Told patient I will let front office know she needs an appt and they will contact you. Patient verbalized understanding and had no other questions

## 2014-06-24 ENCOUNTER — Encounter: Payer: Self-pay | Admitting: Family Medicine

## 2014-07-21 ENCOUNTER — Ambulatory Visit: Payer: Self-pay | Admitting: Family Medicine

## 2014-09-21 ENCOUNTER — Emergency Department (HOSPITAL_COMMUNITY)
Admission: EM | Admit: 2014-09-21 | Discharge: 2014-09-21 | Disposition: A | Discharge status: Home / Self Care | Payer: Self-pay | Attending: Emergency Medicine | Admitting: Emergency Medicine

## 2014-09-21 ENCOUNTER — Encounter (HOSPITAL_COMMUNITY): Payer: Self-pay | Admitting: Emergency Medicine

## 2014-09-21 DIAGNOSIS — G43909 Migraine, unspecified, not intractable, without status migrainosus: Secondary | ICD-10-CM | POA: Insufficient documentation

## 2014-09-21 DIAGNOSIS — Z720 Tobacco use: Secondary | ICD-10-CM | POA: Insufficient documentation

## 2014-09-21 DIAGNOSIS — R21 Rash and other nonspecific skin eruption: Secondary | ICD-10-CM | POA: Insufficient documentation

## 2014-09-21 DIAGNOSIS — Z791 Long term (current) use of non-steroidal anti-inflammatories (NSAID): Secondary | ICD-10-CM | POA: Insufficient documentation

## 2014-09-21 DIAGNOSIS — Z79899 Other long term (current) drug therapy: Secondary | ICD-10-CM | POA: Insufficient documentation

## 2014-09-21 DIAGNOSIS — Z8719 Personal history of other diseases of the digestive system: Secondary | ICD-10-CM | POA: Insufficient documentation

## 2014-09-21 DIAGNOSIS — F329 Major depressive disorder, single episode, unspecified: Secondary | ICD-10-CM | POA: Insufficient documentation

## 2014-09-21 DIAGNOSIS — F419 Anxiety disorder, unspecified: Secondary | ICD-10-CM | POA: Insufficient documentation

## 2014-09-21 DIAGNOSIS — Z8739 Personal history of other diseases of the musculoskeletal system and connective tissue: Secondary | ICD-10-CM | POA: Insufficient documentation

## 2014-09-21 MED ORDER — HYDROXYZINE HCL 10 MG PO TABS: 10.0000 mg | ORAL_TABLET | Freq: Four times a day (QID) | ORAL | Status: DC | PRN

## 2014-09-21 MED ORDER — PREDNISONE 20 MG PO TABS: 40.0000 mg | ORAL_TABLET | Freq: Every day | ORAL | Status: DC

## 2014-09-21 NOTE — Discharge Instructions (Signed)

## 2014-09-21 NOTE — ED Notes (Signed)
Pt stable, ambulatory, states understanding of discharge instructions 

## 2014-09-21 NOTE — ED Notes (Signed)
C/o rash on face, neck, chest and back since yesterday.  Took Benadryl x 1 without relief.

## 2014-09-21 NOTE — ED Provider Notes (Signed)
CSN: [id number]     Arrival date & time 09/21/14  1836 History  This chart was scribed for Montine Circle, PA-C, working with Lacretia Leigh, MD by Starleen Arms, ED Scribe. This patient was seen in room TR06C/TR06C and the patient's care was started at 7:12 PM.   Chief Complaint  Patient presents with  . Rash   The history is provided by the patient. No language interpreter was used.   HPI Comments: Gina Shelton is a 39 y.o. female who presents to the [facility] complaining of a rash on the face, head, neck, chest and back onset yesterday.  She notes some mild pain on the facial rash.  She has used alcohol and benadryl without relief.  The patient denies history of similar episodes.  She has not had any new foods, detergents, lotions, etc.  She has not been exposed to any new environments and has not had any plant exposure.  She denies history of DM, HIV, hepatitis.    Past Medical History  Diagnosis Date  . Depression   . GERD (gastroesophageal reflux disease)   . Migraine   . Gout     ble  . Depression   . Migraine   . Anxiety    Past Surgical History  Procedure Laterality Date  . Cesarean section  infection at incission requiring return to OR x 2  . Tubal ligation     Family History  Problem Relation Age of Onset  . Anesthesia problems Neg Hx   . Hypotension Neg Hx   . Malignant hyperthermia Neg Hx   . Pseudochol deficiency Neg Hx   . Hypertension Mother   . Arthritis Sister    History  Substance Use Topics  . Smoking status: Current Every Day Smoker -- 0.25 packs/day for 20 years    Types: Cigarettes  . Smokeless tobacco: Never Used     Comment: Currently using Nicotine patch  . Alcohol Use: 0.0 oz/week     Comment: socially   OB History    Gravida Para Term Preterm AB TAB SAB Ectopic Multiple Living   3 3        3      Review of Systems  Constitutional: Negative for fever.  Skin: Positive for rash.      Allergies  Review of patient's  allergies indicates no known allergies.  Home Medications   Prior to Admission medications   Medication Sig Start Date End Date Taking? Authorizing Provider  Acetaminophen-Caff-Pyrilamine (MIDOL COMPLETE PO) Take 2 tablets by mouth daily as needed (crampoing).    Historical Provider, MD  albuterol (PROVENTIL HFA;VENTOLIN HFA) 108 (90 BASE) MCG/ACT inhaler Inhale 1-2 puffs into the lungs every 4 (four) hours as needed for wheezing or shortness of breath (cough). 11/21/13   Dahlia Bailiff, PA-C  carbamazepine (TEGRETOL XR) 200 MG 12 hr tablet Take 1 tablet (200 mg total) by mouth 2 (two) times daily. Mood stabilization. 06/08/13   Patrecia Pour, NP  citalopram (CELEXA) 20 MG tablet Take 20 mg by mouth daily.    Historical Provider, MD  gabapentin (NEURONTIN) 100 MG capsule Take 1 capsule (100 mg total) by mouth 3 (three) times daily. Anxiety, neuropathic pain. 06/08/13   Patrecia Pour, NP  ibuprofen (ADVIL,MOTRIN) 800 MG tablet Take 1 tablet (800 mg total) by mouth 3 (three) times daily. 05/19/14   Lezlie Lye, NP  oxyCODONE-acetaminophen (PERCOCET/ROXICET) 5-325 MG per tablet Take 1-2 tablets by mouth every 6 (six) hours as needed for severe  pain. 05/29/14   Luvenia Redden, PA-C  risperiDONE (RISPERDAL) 1 MG tablet Take 1 tablet (1 mg total) by mouth at bedtime. 06/08/13   Patrecia Pour, NP  traZODone (DESYREL) 100 MG tablet Take 1 tablet (100 mg total) by mouth at bedtime as needed for sleep. 06/08/13   Patrecia Pour, NP   BP 148/95 mmHg  Pulse 90  Temp(Src) 98.6 F (37 C) (Oral)  Resp 18  Ht 5\' 2"  (1.575 m)  Wt 175 lb (79.379 kg)  BMI 32.00 kg/m2  SpO2 98%  LMP 08/21/2014 Physical Exam  Constitutional: She is oriented to person, place, and time. She appears well-developed and well-nourished. No distress.  HENT:  Head: Normocephalic and atraumatic.  Eyes: Conjunctivae and EOM are normal.  Neck: Neck supple. No tracheal deviation present.  Cardiovascular: Normal rate.   Pulmonary/Chest:  Effort normal. No respiratory distress.  Musculoskeletal: Normal range of motion.  Neurological: She is alert and oriented to person, place, and time.  Skin: Skin is warm and dry.  Maculopapular rash on face and trunk, no abscess, no cellulitis  Psychiatric: She has a normal mood and affect. Her behavior is normal.  Nursing note and vitals reviewed.   ED Course  Procedures (including critical care time)  DIAGNOSTIC STUDIES: Oxygen Saturation is 98% on RA, normal by my interpretation.    COORDINATION OF CARE:  7:17 PM Will prescribe prednisone and topical anti-itch cream.  Patient should continue to use benadryl.  Patient should f/u with dermatology if no improvement observed.  Patient acknowledges and agrees with plan.    Labs Review Labs Reviewed - No data to display  Imaging Review No results found.   EKG Interpretation None      MDM   Final diagnoses:  Rash    Patient with rash, likely contact dermatitis.  Will give prednisone burst as it is on the patient's face.  Recommend dermatology follow-up.  No evidence of anaphylaxis.  I personally performed the services described in this documentation, which was scribed in my presence. The recorded information has been reviewed and is accurate.     Montine Circle, PA-C 09/21/14 1923  Lacretia Leigh, MD 09/21/14 [phone]

## 2014-09-26 ENCOUNTER — Emergency Department (HOSPITAL_COMMUNITY)
Admission: EM | Admit: 2014-09-26 | Discharge: 2014-09-26 | Disposition: A | Discharge status: Home / Self Care | Payer: Self-pay | Attending: Emergency Medicine | Admitting: Emergency Medicine

## 2014-09-26 ENCOUNTER — Encounter (HOSPITAL_COMMUNITY): Payer: Self-pay | Admitting: *Deleted

## 2014-09-26 DIAGNOSIS — L0291 Cutaneous abscess, unspecified: Secondary | ICD-10-CM

## 2014-09-26 DIAGNOSIS — L02211 Cutaneous abscess of abdominal wall: Secondary | ICD-10-CM | POA: Insufficient documentation

## 2014-09-26 DIAGNOSIS — F419 Anxiety disorder, unspecified: Secondary | ICD-10-CM | POA: Insufficient documentation

## 2014-09-26 DIAGNOSIS — G43909 Migraine, unspecified, not intractable, without status migrainosus: Secondary | ICD-10-CM | POA: Insufficient documentation

## 2014-09-26 DIAGNOSIS — M109 Gout, unspecified: Secondary | ICD-10-CM | POA: Insufficient documentation

## 2014-09-26 DIAGNOSIS — Z7952 Long term (current) use of systemic steroids: Secondary | ICD-10-CM | POA: Insufficient documentation

## 2014-09-26 DIAGNOSIS — F329 Major depressive disorder, single episode, unspecified: Secondary | ICD-10-CM | POA: Insufficient documentation

## 2014-09-26 DIAGNOSIS — Z8719 Personal history of other diseases of the digestive system: Secondary | ICD-10-CM | POA: Insufficient documentation

## 2014-09-26 DIAGNOSIS — Z791 Long term (current) use of non-steroidal anti-inflammatories (NSAID): Secondary | ICD-10-CM | POA: Insufficient documentation

## 2014-09-26 DIAGNOSIS — Z79899 Other long term (current) drug therapy: Secondary | ICD-10-CM | POA: Insufficient documentation

## 2014-09-26 DIAGNOSIS — Z720 Tobacco use: Secondary | ICD-10-CM | POA: Insufficient documentation

## 2014-09-26 MED ORDER — LIDOCAINE-EPINEPHRINE (PF) 2 %-1:200000 IJ SOLN
10.0000 mL | Freq: Once | INTRAMUSCULAR | Status: AC
  Administered 2014-09-26: 10 mL via INTRADERMAL
  Filled 2014-09-26: qty 20

## 2014-09-26 NOTE — ED Provider Notes (Signed)
CSN: [id number]     Arrival date & time 09/26/14  1216 History  This chart was scribed for non-physician practitioner Glendell Docker, NP working with Lajean Saver, MD by Meriel Pica, ED Scribe. This patient was seen in room TR09C/TR09C and the patient's care was started at 12:30 PM.     Chief Complaint  Patient presents with  . Skin Problem  . Groin Pain   The history is provided by the patient. No language interpreter was used.    HPI Comments: Gina Shelton is a 39 y.o. female, with a PMhx of abscesses, who presents to the Emergency Department complaining of a gradually worsening area of pain and swelling in left-sided groin region onset 2 days. Pt has not tried any alleviating treatments but has taken ibuprofen approximately 1 hour ago. She reports a history of similar infections in the same area. Denies fevers.   Past Medical History  Diagnosis Date  . Depression   . GERD (gastroesophageal reflux disease)   . Migraine   . Gout     ble  . Depression   . Migraine   . Anxiety    Past Surgical History  Procedure Laterality Date  . Cesarean section  infection at incission requiring return to OR x 2  . Tubal ligation     Family History  Problem Relation Age of Onset  . Anesthesia problems Neg Hx   . Hypotension Neg Hx   . Malignant hyperthermia Neg Hx   . Pseudochol deficiency Neg Hx   . Hypertension Mother   . Arthritis Sister    History  Substance Use Topics  . Smoking status: Current Every Day Smoker -- 0.25 packs/day for 20 years    Types: Cigarettes  . Smokeless tobacco: Never Used     Comment: Currently using Nicotine patch  . Alcohol Use: 0.0 oz/week     Comment: socially   OB History    Gravida Para Term Preterm AB TAB SAB Ectopic Multiple Living   3 3        3      Review of Systems  Constitutional: Negative for fever.  Skin: Positive for wound.  All other systems reviewed and are negative.  Allergies  Review of patient's allergies indicates no  known allergies.  Home Medications   Prior to Admission medications   Medication Sig Start Date End Date Taking? Authorizing Provider  Acetaminophen-Caff-Pyrilamine (MIDOL COMPLETE PO) Take 2 tablets by mouth daily as needed (crampoing).    Historical Provider, MD  albuterol (PROVENTIL HFA;VENTOLIN HFA) 108 (90 BASE) MCG/ACT inhaler Inhale 1-2 puffs into the lungs every 4 (four) hours as needed for wheezing or shortness of breath (cough). 11/21/13   Dahlia Bailiff, PA-C  carbamazepine (TEGRETOL XR) 200 MG 12 hr tablet Take 1 tablet (200 mg total) by mouth 2 (two) times daily. Mood stabilization. 06/08/13   Patrecia Pour, NP  citalopram (CELEXA) 20 MG tablet Take 20 mg by mouth daily.    Historical Provider, MD  gabapentin (NEURONTIN) 100 MG capsule Take 1 capsule (100 mg total) by mouth 3 (three) times daily. Anxiety, neuropathic pain. 06/08/13   Patrecia Pour, NP  hydrOXYzine (ATARAX/VISTARIL) 10 MG tablet Take 1 tablet (10 mg total) by mouth every 6 (six) hours as needed for itching. 09/21/14   Montine Circle, PA-C  ibuprofen (ADVIL,MOTRIN) 800 MG tablet Take 1 tablet (800 mg total) by mouth 3 (three) times daily. 05/19/14   Lezlie Lye, NP  oxyCODONE-acetaminophen (PERCOCET/ROXICET) 5-325 MG per  tablet Take 1-2 tablets by mouth every 6 (six) hours as needed for severe pain. 05/29/14   Luvenia Redden, PA-C  predniSONE (DELTASONE) 20 MG tablet Take 2 tablets (40 mg total) by mouth daily. 09/21/14   Montine Circle, PA-C  risperiDONE (RISPERDAL) 1 MG tablet Take 1 tablet (1 mg total) by mouth at bedtime. 06/08/13   Patrecia Pour, NP  traZODone (DESYREL) 100 MG tablet Take 1 tablet (100 mg total) by mouth at bedtime as needed for sleep. 06/08/13   Patrecia Pour, NP   BP 170/88 mmHg  Pulse 84  Temp(Src) 98 F (36.7 C) (Oral)  Resp 20  Wt 179 lb (81.194 kg)  SpO2 100%  LMP 08/21/2014 Physical Exam  Constitutional: She appears well-developed and well-nourished. No distress.  HENT:  Head:  Normocephalic.  Eyes: Conjunctivae are normal. Right eye exhibits no discharge. Left eye exhibits no discharge. No scleral icterus.  Neck: No JVD present.  Pulmonary/Chest: Effort normal. No respiratory distress.  Neurological: She is alert. Coordination normal.  Skin:  Fluctuance noted to the left groin. No redness to the area  Psychiatric: She has a normal mood and affect. Her behavior is normal.  Nursing note and vitals reviewed.   ED Course  Procedures  DIAGNOSTIC STUDIES: Oxygen Saturation is 100% on RA, normal by my interpretation.    COORDINATION OF CARE: 12:34 PM Discussed treatment plan which includes to perform I&D procedure with pt. Pt acknowledges and agrees to plan.  INCISION AND DRAINAGE PROCEDURE NOTE: Patient identification was confirmed and verbal consent was obtained. This procedure was performed by Glendell Docker, NP at 12:37 PM. Site: left groin  Sterile procedures observed Needle size: 27 Anesthetic used (type and amt): lidocaine 2% with epinephrine  Blade size: 11 Drainage: purulent Complexity: Complex Packing used 1/4 Site anesthetized, incision made over site, wound drained and explored loculations, rinsed with copious amounts of normal saline, wound packed with sterile gauze, covered with dry, sterile dressing.  Pt tolerated procedure well without complications.  Instructions for care discussed verbally and pt provided with additional written instructions for homecare and f/u.   MDM   Final diagnoses:  Abscess    Abscess drained and packed. Pt instructed on return for packing removal. No antibiotics needed at this time  I personally performed the services described in this documentation, which was scribed in my presence. The recorded information has been reviewed and is accurate.     Glendell Docker, NP 09/26/14 [place], MD 09/26/14 1344

## 2014-09-26 NOTE — Discharge Instructions (Signed)

## 2014-09-26 NOTE — ED Notes (Signed)
Patient has raised area that is painful to the left inner thigh/labia area.  No active drainage.  There are small areas around the wound.  Patient states has hx of abcess 1 year ago.   Patient states she did take ibuprofen today at [facility]

## 2014-09-28 ENCOUNTER — Emergency Department (HOSPITAL_COMMUNITY)
Admission: EM | Admit: 2014-09-28 | Discharge: 2014-09-28 | Disposition: A | Discharge status: Home / Self Care | Payer: Self-pay | Attending: Emergency Medicine | Admitting: Emergency Medicine

## 2014-09-28 ENCOUNTER — Encounter (HOSPITAL_COMMUNITY): Payer: Self-pay | Admitting: Emergency Medicine

## 2014-09-28 DIAGNOSIS — Z79899 Other long term (current) drug therapy: Secondary | ICD-10-CM | POA: Insufficient documentation

## 2014-09-28 DIAGNOSIS — L02214 Cutaneous abscess of groin: Secondary | ICD-10-CM | POA: Insufficient documentation

## 2014-09-28 DIAGNOSIS — Z8719 Personal history of other diseases of the digestive system: Secondary | ICD-10-CM | POA: Insufficient documentation

## 2014-09-28 DIAGNOSIS — G43909 Migraine, unspecified, not intractable, without status migrainosus: Secondary | ICD-10-CM | POA: Insufficient documentation

## 2014-09-28 DIAGNOSIS — F329 Major depressive disorder, single episode, unspecified: Secondary | ICD-10-CM | POA: Insufficient documentation

## 2014-09-28 DIAGNOSIS — Z720 Tobacco use: Secondary | ICD-10-CM | POA: Insufficient documentation

## 2014-09-28 DIAGNOSIS — Z8739 Personal history of other diseases of the musculoskeletal system and connective tissue: Secondary | ICD-10-CM | POA: Insufficient documentation

## 2014-09-28 DIAGNOSIS — F419 Anxiety disorder, unspecified: Secondary | ICD-10-CM | POA: Insufficient documentation

## 2014-09-28 MED ORDER — LIDOCAINE-EPINEPHRINE (PF) 2 %-1:200000 IJ SOLN
10.0000 mL | Freq: Once | INTRAMUSCULAR | Status: DC
  Filled 2014-09-28: qty 20

## 2014-09-28 MED ORDER — OXYCODONE HCL 5 MG PO TABS: 5.0000 mg | ORAL_TABLET | Freq: Once | ORAL | Status: DC

## 2014-09-28 MED ORDER — IBUPROFEN 600 MG PO TABS: 600.0000 mg | ORAL_TABLET | Freq: Four times a day (QID) | ORAL | Status: DC | PRN

## 2014-09-28 MED ORDER — OXYCODONE HCL 5 MG PO TABS
5.0000 mg | ORAL_TABLET | Freq: Once | ORAL | Status: AC
  Administered 2014-09-28: 5 mg via ORAL
  Filled 2014-09-28: qty 1

## 2014-09-28 NOTE — ED Notes (Signed)
Patient presents to ED with reports of pain at wound site near vaginal area. Reports she was seen in ED 2 days ago, and wound was packed at that time. Reports dressing changed twice daily since that time. Reports pain as 9/10 on 0-10 pain scale.

## 2014-09-28 NOTE — ED Provider Notes (Signed)
CSN: [id number]     Arrival date & time 09/28/14  2010 History   First MD Initiated Contact with Patient 09/28/14 2025     Chief Complaint  Patient presents with  . Pain    (Consider location/radiation/quality/duration/timing/severity/associated sxs/prior Treatment) Patient is a 39 y.o. female presenting with abscess. The history is provided by the patient and medical records. No language interpreter was used.  Abscess Location:  Ano-genital Ano-genital abscess location:  Groin Size:  5 mm Abscess quality: painful   Abscess quality: not draining, no fluctuance, no induration, no redness and no warmth   Red streaking: no   Progression:  Improving Pain details:    Quality:  Pressure   Severity:  Moderate   Timing:  Intermittent   Progression:  Waxing and waning Chronicity:  New Context: skin injury   Relieved by: I/d. Worsened by:  Nothing tried Ineffective treatments:  None tried Associated symptoms: no fatigue, no fever, no headaches, no nausea and no vomiting     Past Medical History  Diagnosis Date  . Depression   . GERD (gastroesophageal reflux disease)   . Migraine   . Gout     ble  . Depression   . Migraine   . Anxiety    Past Surgical History  Procedure Laterality Date  . Cesarean section  infection at incission requiring return to OR x 2  . Tubal ligation     Family History  Problem Relation Age of Onset  . Anesthesia problems Neg Hx   . Hypotension Neg Hx   . Malignant hyperthermia Neg Hx   . Pseudochol deficiency Neg Hx   . Hypertension Mother   . Arthritis Sister    History  Substance Use Topics  . Smoking status: Current Every Day Smoker -- 1.00 packs/day for 20 years    Types: Cigarettes  . Smokeless tobacco: Never Used     Comment: Currently using Nicotine patch  . Alcohol Use: 0.0 oz/week     Comment: socially   OB History    Gravida Para Term Preterm AB TAB SAB Ectopic Multiple Living   3 3        3      Review of Systems    Constitutional: Negative for fever and fatigue.  Respiratory: Negative for cough, chest tightness and shortness of breath.   Cardiovascular: Negative for chest pain.  Gastrointestinal: Negative for nausea, vomiting and abdominal pain.  Genitourinary: Negative for dysuria.  Skin: Positive for wound (left groin).  Neurological: Negative for weakness, light-headedness and headaches.  Psychiatric/Behavioral: Negative for confusion.  All other systems reviewed and are negative.     Allergies  Review of patient's allergies indicates no known allergies.  Home Medications   Prior to Admission medications   Medication Sig Start Date End Date Taking? Authorizing Provider  Acetaminophen-Caff-Pyrilamine (MIDOL COMPLETE PO) Take 2 tablets by mouth daily as needed (crampoing).    Historical Provider, MD  albuterol (PROVENTIL HFA;VENTOLIN HFA) 108 (90 BASE) MCG/ACT inhaler Inhale 1-2 puffs into the lungs every 4 (four) hours as needed for wheezing or shortness of breath (cough). 11/21/13   Dahlia Bailiff, PA-C  carbamazepine (TEGRETOL XR) 200 MG 12 hr tablet Take 1 tablet (200 mg total) by mouth 2 (two) times daily. Mood stabilization. 06/08/13   Patrecia Pour, NP  citalopram (CELEXA) 20 MG tablet Take 20 mg by mouth daily.    Historical Provider, MD  gabapentin (NEURONTIN) 100 MG capsule Take 1 capsule (100 mg total) by mouth 3 (  three) times daily. Anxiety, neuropathic pain. 06/08/13   Patrecia Pour, NP  hydrOXYzine (ATARAX/VISTARIL) 10 MG tablet Take 1 tablet (10 mg total) by mouth every 6 (six) hours as needed for itching. 09/21/14   Montine Circle, PA-C  ibuprofen (ADVIL,MOTRIN) 800 MG tablet Take 1 tablet (800 mg total) by mouth 3 (three) times daily. 05/19/14   Lezlie Lye, NP  oxyCODONE-acetaminophen (PERCOCET/ROXICET) 5-325 MG per tablet Take 1-2 tablets by mouth every 6 (six) hours as needed for severe pain. 05/29/14   Luvenia Redden, PA-C  predniSONE (DELTASONE) 20 MG tablet Take 2 tablets (40  mg total) by mouth daily. 09/21/14   Montine Circle, PA-C  risperiDONE (RISPERDAL) 1 MG tablet Take 1 tablet (1 mg total) by mouth at bedtime. 06/08/13   Patrecia Pour, NP  traZODone (DESYREL) 100 MG tablet Take 1 tablet (100 mg total) by mouth at bedtime as needed for sleep. 06/08/13   Patrecia Pour, NP   BP 136/92 mmHg  Pulse 96  Temp(Src) 98.7 F (37.1 C) (Oral)  Ht 5' 2.5" (1.588 m)  Wt 179 lb (81.194 kg)  BMI 32.20 kg/m2  SpO2 100%  LMP 09/20/2014 Physical Exam  Constitutional: She appears well-developed and well-nourished. No distress.  HENT:  Head: Normocephalic and atraumatic.  Nose: Nose normal.  Mouth/Throat: Oropharynx is clear and moist. No oropharyngeal exudate.  Eyes: EOM are normal. Pupils are equal, round, and reactive to light.  Neck: Normal range of motion. Neck supple.  Cardiovascular: Normal rate, regular rhythm, normal heart sounds and intact distal pulses.   No murmur heard. Pulmonary/Chest: Effort normal and breath sounds normal. No respiratory distress. She has no wheezes. She exhibits no tenderness.  Abdominal: Soft. There is no tenderness. There is no rebound and no guarding.  Genitourinary:     Musculoskeletal: Normal range of motion. She exhibits no tenderness.  Lymphadenopathy:    She has no cervical adenopathy.  Neurological: She is alert. No cranial nerve deficit. Coordination normal.  Skin: Skin is warm and dry. She is not diaphoretic.  Psychiatric: She has a normal mood and affect. Her behavior is normal. Judgment and thought content normal.  Nursing note and vitals reviewed.   ED Course  Procedures (including critical care time) Labs Review Labs Reviewed - No data to display  Imaging Review No results found.   EKG Interpretation None      MDM   Final diagnoses:  Abscess of left groin   Pt is a 39 yo F with hx of depression and migraines who presents with pain at her left groin.  Left groin abscess was diagnosed 7/15, then  I/D'd.   The wound was packed and she was instructed to return to the ED today to have her packing removed today.  She reports continued pain but minimal at the site.  No surrounding erythema, no fever, no new abscesses noted.   Patient has a 0.5 cm lesion at her left proximal medial thigh with packing in place.  No surrounding erythema.  + induration and tenderness around the wound.  She was given pain control, the packing was irrigated then pulled out easily.  The wound was irrigated and no additional purulence was noted.  No signs of continued surrounding infection.  Advised on wound care and pain control at home.  Given short Rx for roxicodone and advised motrin q6 PRN.  Encouraged f/u with PCP and given # to wellness center.  All questions were answered and ED return precautions discussed  prior to dc home in good condition.    Patient was seen with ED Attending, Dr. Ludwig Clarks, MD    Tori Milks, MD 09/30/14 9629  Orpah Greek, MD 10/02/14 [phone]

## 2014-09-28 NOTE — Discharge Instructions (Signed)

## 2014-09-28 NOTE — ED Notes (Signed)
Provider bedside.

## 2014-10-14 ENCOUNTER — Emergency Department (HOSPITAL_COMMUNITY)
Admission: EM | Admit: 2014-10-14 | Discharge: 2014-10-14 | Disposition: A | Discharge status: Home / Self Care | Payer: Self-pay | Attending: Emergency Medicine | Admitting: Emergency Medicine

## 2014-10-14 ENCOUNTER — Encounter (HOSPITAL_COMMUNITY): Payer: Self-pay | Admitting: Emergency Medicine

## 2014-10-14 ENCOUNTER — Emergency Department (HOSPITAL_COMMUNITY): Payer: Self-pay

## 2014-10-14 DIAGNOSIS — Z79899 Other long term (current) drug therapy: Secondary | ICD-10-CM | POA: Insufficient documentation

## 2014-10-14 DIAGNOSIS — J441 Chronic obstructive pulmonary disease with (acute) exacerbation: Secondary | ICD-10-CM | POA: Insufficient documentation

## 2014-10-14 DIAGNOSIS — R059 Cough, unspecified: Secondary | ICD-10-CM

## 2014-10-14 DIAGNOSIS — M546 Pain in thoracic spine: Secondary | ICD-10-CM | POA: Insufficient documentation

## 2014-10-14 DIAGNOSIS — Z720 Tobacco use: Secondary | ICD-10-CM | POA: Insufficient documentation

## 2014-10-14 DIAGNOSIS — F419 Anxiety disorder, unspecified: Secondary | ICD-10-CM | POA: Insufficient documentation

## 2014-10-14 DIAGNOSIS — R0781 Pleurodynia: Secondary | ICD-10-CM | POA: Insufficient documentation

## 2014-10-14 DIAGNOSIS — Z8719 Personal history of other diseases of the digestive system: Secondary | ICD-10-CM | POA: Insufficient documentation

## 2014-10-14 DIAGNOSIS — R05 Cough: Secondary | ICD-10-CM

## 2014-10-14 DIAGNOSIS — F329 Major depressive disorder, single episode, unspecified: Secondary | ICD-10-CM | POA: Insufficient documentation

## 2014-10-14 DIAGNOSIS — G43909 Migraine, unspecified, not intractable, without status migrainosus: Secondary | ICD-10-CM | POA: Insufficient documentation

## 2014-10-14 DIAGNOSIS — F17200 Nicotine dependence, unspecified, uncomplicated: Secondary | ICD-10-CM

## 2014-10-14 DIAGNOSIS — Z79891 Long term (current) use of opiate analgesic: Secondary | ICD-10-CM | POA: Insufficient documentation

## 2014-10-14 LAB — CBC WITH DIFFERENTIAL/PLATELET
BASOS ABS: 0 10*3/uL (ref 0.0–0.1)
BASOS PCT: 0 % (ref 0–1)
Eosinophils Absolute: 0.1 10*3/uL (ref 0.0–0.7)
Eosinophils Relative: 1 % (ref 0–5)
HCT: 30.1 % — ABNORMAL LOW (ref 36.0–46.0)
HEMOGLOBIN: 8.8 g/dL — AB (ref 12.0–15.0)
LYMPHS ABS: 1.6 10*3/uL (ref 0.7–4.0)
Lymphocytes Relative: 22 % (ref 12–46)
MCH: 21.9 pg — ABNORMAL LOW (ref 26.0–34.0)
MCHC: 29.2 g/dL — ABNORMAL LOW (ref 30.0–36.0)
MCV: 75.1 fL — ABNORMAL LOW (ref 78.0–100.0)
MONO ABS: 0.6 10*3/uL (ref 0.1–1.0)
MONOS PCT: 9 % (ref 3–12)
NEUTROS ABS: 4.8 10*3/uL (ref 1.7–7.7)
Neutrophils Relative %: 68 % (ref 43–77)
PLATELETS: 300 10*3/uL (ref 150–400)
RBC: 4.01 MIL/uL (ref 3.87–5.11)
RDW: 18.6 % — AB (ref 11.5–15.5)
WBC: 7.1 10*3/uL (ref 4.0–10.5)

## 2014-10-14 LAB — BASIC METABOLIC PANEL
Anion gap: 6 (ref 5–15)
BUN: 8 mg/dL (ref 6–20)
CHLORIDE: 108 mmol/L (ref 101–111)
CO2: 23 mmol/L (ref 22–32)
CREATININE: 0.55 mg/dL (ref 0.44–1.00)
Calcium: 9 mg/dL (ref 8.9–10.3)
GFR calc Af Amer: >60 mL/min (ref >60)
Glucose, Bld: 94 mg/dL (ref 65–99)
POTASSIUM: 4.1 mmol/L (ref 3.5–5.1)
Sodium: 137 mmol/L (ref 135–145)

## 2014-10-14 LAB — BRAIN NATRIURETIC PEPTIDE: B NATRIURETIC PEPTIDE 5: 18.3 pg/mL (ref 0.0–100.0)

## 2014-10-14 LAB — TROPONIN I: Troponin I: <0.03 ng/mL (ref <0.031)

## 2014-10-14 MED ORDER — ALBUTEROL SULFATE HFA 108 (90 BASE) MCG/ACT IN AERS
2.0000 | INHALATION_SPRAY | Freq: Once | RESPIRATORY_TRACT | Status: AC
  Administered 2014-10-14: 2 via RESPIRATORY_TRACT
  Filled 2014-10-14: qty 6.7

## 2014-10-14 MED ORDER — LEVOFLOXACIN 750 MG PO TABS
750.0000 mg | ORAL_TABLET | Freq: Once | ORAL | Status: AC
  Administered 2014-10-14: 750 mg via ORAL
  Filled 2014-10-14: qty 1

## 2014-10-14 MED ORDER — LEVOFLOXACIN 750 MG PO TABS
750.0000 mg | ORAL_TABLET | Freq: Every day | ORAL | Status: DC
Start: 2014-10-14 — End: 2014-12-20

## 2014-10-14 MED ORDER — HYDROCODONE-ACETAMINOPHEN 5-325 MG PO TABS: ORAL_TABLET | ORAL | Status: DC

## 2014-10-14 MED ORDER — MORPHINE SULFATE 4 MG/ML IJ SOLN
4.0000 mg | Freq: Once | INTRAMUSCULAR | Status: AC
  Administered 2014-10-14: 4 mg via INTRAVENOUS
  Filled 2014-10-14: qty 1

## 2014-10-14 MED ORDER — IPRATROPIUM-ALBUTEROL 0.5-2.5 (3) MG/3ML IN SOLN
3.0000 mL | Freq: Once | RESPIRATORY_TRACT | Status: AC
  Administered 2014-10-14: 3 mL via RESPIRATORY_TRACT
  Filled 2014-10-14: qty 3

## 2014-10-14 MED ORDER — ONDANSETRON HCL 4 MG/2ML IJ SOLN
4.0000 mg | Freq: Once | INTRAMUSCULAR | Status: AC
  Administered 2014-10-14: 4 mg via INTRAVENOUS
  Filled 2014-10-14: qty 2

## 2014-10-14 MED ORDER — PREDNISONE 50 MG PO TABS
ORAL_TABLET | ORAL | Status: DC
Start: 2014-10-14 — End: 2014-12-20

## 2014-10-14 MED ORDER — PREDNISONE 20 MG PO TABS
60.0000 mg | ORAL_TABLET | Freq: Once | ORAL | Status: AC
  Administered 2014-10-14: 60 mg via ORAL
  Filled 2014-10-14: qty 3

## 2014-10-14 NOTE — ED Provider Notes (Signed)
CSN: [id number]     Arrival date & time 10/14/14  1456 History   First MD Initiated Contact with Patient 10/14/14 1513     Chief Complaint  Patient presents with  . Cough  . Chest Pain     (Consider location/radiation/quality/duration/timing/severity/associated sxs/prior Treatment) HPI  Pulse 93, temperature 98 F (36.7 C), temperature source Oral, weight 173 lb (78.472 kg), last menstrual period 09/20/2014, SpO2 100 %.  Gina Shelton is a 39 y.o. female complaining of productive cough (x2weeks), shortness of breath, sharp pleuritic chest pain radiating to the back, nausea onset 1 week ago. Patient is active daily smoker, states she has history of COPD. She states that she has tactile fever, dyspnea on exertion, orthopnea, PND. Patient denies emesis, increasing peripheral edema, recent immobilizations, calf pain, leg swelling. She has a history of anxiety but states this feels disimilar.  Past Medical History  Diagnosis Date  . Depression   . GERD (gastroesophageal reflux disease)   . Migraine   . Gout     ble  . Depression   . Migraine   . Anxiety    Past Surgical History  Procedure Laterality Date  . Cesarean section  infection at incission requiring return to OR x 2  . Tubal ligation     Family History  Problem Relation Age of Onset  . Anesthesia problems Neg Hx   . Hypotension Neg Hx   . Malignant hyperthermia Neg Hx   . Pseudochol deficiency Neg Hx   . Hypertension Mother   . Arthritis Sister    History  Substance Use Topics  . Smoking status: Current Every Day Smoker -- 1.00 packs/day for 20 years    Types: Cigarettes  . Smokeless tobacco: Never Used     Comment: Currently using Nicotine patch  . Alcohol Use: 0.0 oz/week     Comment: socially   OB History    Gravida Para Term Preterm AB TAB SAB Ectopic Multiple Living   3 3        3      Review of Systems  10 systems reviewed and found to be negative, except as noted in the HPI.    Allergies    Review of patient's allergies indicates no known allergies.  Home Medications   Prior to Admission medications   Medication Sig Start Date End Date Taking? Authorizing Provider  Acetaminophen-Caff-Pyrilamine (MIDOL COMPLETE PO) Take 2 tablets by mouth 2 (two) times daily as needed (menstrual cramps).    Yes Historical Provider, MD  albuterol (PROVENTIL HFA;VENTOLIN HFA) 108 (90 BASE) MCG/ACT inhaler Inhale 1-2 puffs into the lungs every 4 (four) hours as needed for wheezing or shortness of breath (cough). 11/21/13  Yes Dahlia Bailiff, PA-C  carbamazepine (TEGRETOL XR) 200 MG 12 hr tablet Take 1 tablet (200 mg total) by mouth 2 (two) times daily. Mood stabilization. 06/08/13  Yes Patrecia Pour, NP  citalopram (CELEXA) 20 MG tablet Take 20 mg by mouth daily.   Yes Historical Provider, MD  gabapentin (NEURONTIN) 100 MG capsule Take 1 capsule (100 mg total) by mouth 3 (three) times daily. Anxiety, neuropathic pain. 06/08/13  Yes Patrecia Pour, NP  hydrOXYzine (ATARAX/VISTARIL) 10 MG tablet Take 1 tablet (10 mg total) by mouth every 6 (six) hours as needed for itching. Patient taking differently: Take 10 mg by mouth daily after lunch.  09/21/14  Yes Montine Circle, PA-C  ibuprofen (ADVIL,MOTRIN) 600 MG tablet Take 1 tablet (600 mg total) by mouth every 6 (six) hours  as needed. 09/28/14  Yes Tori Milks, MD  oxyCODONE (OXY IR/ROXICODONE) 5 MG immediate release tablet Take 1 tablet (5 mg total) by mouth once. 09/28/14  Yes Tori Milks, MD  risperiDONE (RISPERDAL) 1 MG tablet Take 1 tablet (1 mg total) by mouth at bedtime. 06/08/13  Yes Patrecia Pour, NP  traZODone (DESYREL) 100 MG tablet Take 1 tablet (100 mg total) by mouth at bedtime as needed for sleep. Patient taking differently: Take 100 mg by mouth at bedtime.  06/08/13  Yes Patrecia Pour, NP  HYDROcodone-acetaminophen (NORCO/VICODIN) 5-325 MG per tablet Take 1-2 tablets by mouth every 6 hours as needed for pain and/or cough. 10/14/14   Teyonna Plaisted,  PA-C  levofloxacin (LEVAQUIN) 750 MG tablet Take 1 tablet (750 mg total) by mouth daily. X 7 days 10/14/14   Monico Blitz, PA-C  oxyCODONE-acetaminophen (PERCOCET/ROXICET) 5-325 MG per tablet Take 1-2 tablets by mouth every 6 (six) hours as needed for severe pain. Patient not taking: Reported on 09/28/2014 05/29/14   Luvenia Redden, PA-C  predniSONE (DELTASONE) 50 MG tablet Take 1 tablet daily with breakfast 10/14/14   Elmyra Ricks [employer], PA-C   BP 149/92 mmHg  Pulse 93  Temp(Src) 98 F (36.7 C) (Oral)  Resp 17  Wt 173 lb (78.472 kg)  SpO2 97%  LMP 09/20/2014 Physical Exam  Constitutional: She is oriented to person, place, and time. She appears well-developed and well-nourished. No distress.  HENT:  Head: Normocephalic and atraumatic.  Mouth/Throat: Oropharynx is clear and moist.  Eyes: Conjunctivae and EOM are normal. Pupils are equal, round, and reactive to light.  Neck: Normal range of motion. No JVD present. No tracheal deviation present.  Cardiovascular: Normal rate, regular rhythm and intact distal pulses.   Radial pulse equal bilaterally  Pulmonary/Chest: Effort normal and breath sounds normal. No stridor. No respiratory distress. She has no wheezes. She has no rales. She exhibits tenderness.    Abdominal: Soft. Bowel sounds are normal. She exhibits no distension and no mass. There is no tenderness. There is no rebound and no guarding.  Musculoskeletal: Normal range of motion. She exhibits no edema or tenderness.       Arms: No calf asymmetry, superficial collaterals, palpable cords, edema, Homans sign negative bilaterally.    Neurological: She is alert and oriented to person, place, and time.  Skin: Skin is warm. She is not diaphoretic.  Psychiatric: She has a normal mood and affect.  Nursing note and vitals reviewed.   ED Course  Procedures (including critical care time) Labs Review Labs Reviewed  CBC WITH DIFFERENTIAL/PLATELET - Abnormal; Notable for the following:     Hemoglobin 8.8 (*)    HCT 30.1 (*)    MCV 75.1 (*)    MCH 21.9 (*)    MCHC 29.2 (*)    RDW 18.6 (*)    All other components within normal limits  BASIC METABOLIC PANEL  BRAIN NATRIURETIC PEPTIDE  TROPONIN I    Imaging Review Dg Chest 2 View  10/14/2014   CLINICAL DATA:  Cough and chest pain  EXAM: CHEST  2 VIEW  COMPARISON:  12/04/2013  FINDINGS: The heart size and mediastinal contours are within normal limits. Both lungs are clear. The visualized skeletal structures are unremarkable.  IMPRESSION: No active cardiopulmonary disease.   Electronically Signed   By: Franchot Gallo M.D.   On: 10/14/2014 15:37     EKG Interpretation   Date/Time:  Tuesday October 14 2014 15:17:55 EDT Ventricular Rate:  96 PR  Interval:  129 QRS Duration: 87 QT Interval:  387 QTC Calculation: 489 R Axis:   60 Text Interpretation:  Sinus rhythm Borderline prolonged QT interval  similar to previous from 08/28/12 Confirmed by Wilson Singer  MD, STEPHEN ([id number]) on  10/14/2014 3:44:07 PM      MDM   Final diagnoses:  Cough  COPD exacerbation  Pleuritic chest pain  Tobacco use disorder    Filed Vitals:   10/14/14 1515 10/14/14 1530 10/14/14 1545 10/14/14 1600  BP: 160/92 163/102 156/85 149/92  Pulse: 83 100 64 93  Temp:      TempSrc:      Resp:  19 14 17   Weight:      SpO2: 100% 100% 100% 97%    Medications  levofloxacin (LEVAQUIN) tablet 750 mg (not administered)  albuterol (PROVENTIL HFA;VENTOLIN HFA) 108 (90 BASE) MCG/ACT inhaler 2 puff (not administered)  morphine 4 MG/ML injection 4 mg (4 mg Intravenous Given 10/14/14 1541)  predniSONE (DELTASONE) tablet 60 mg (60 mg Oral Given 10/14/14 1537)  ipratropium-albuterol (DUONEB) 0.5-2.5 (3) MG/3ML nebulizer solution 3 mL (3 mLs Nebulization Given 10/14/14 1539)  ondansetron (ZOFRAN) injection 4 mg (4 mg Intravenous Given 10/14/14 1541)    Gina Shelton is a pleasant 39 y.o. female presenting with productive cough, chest pain, shortness of breath, patient is  an active daily smoker, she states that she has a history of COPD. On my exam her lung sounds are clear to auscultation, there is no wheezing, no respiratory distress. Her EKG is nonischemic with a borderline prolonged QT at 489. Her chest x-ray is without infiltrate and blood work including troponin and BNP are unremarkable. Patient feels better with nebulizer, prednisone and morphine. We'll treat her for a COPD exacerbation with Levaquin. Discussed with attending physician S antibiotics choice given her mildly prolonged QTC. Increase the patient is appropriate for treatment with Levaquin.  Evaluation does not show pathology that would require ongoing emergent intervention or inpatient treatment. Pt is hemodynamically stable and mentating appropriately. Discussed findings and plan with patient/guardian, who agrees with care plan. All questions answered. Return precautions discussed and outpatient follow up given.   New Prescriptions   HYDROCODONE-ACETAMINOPHEN (NORCO/VICODIN) 5-325 MG PER TABLET    Take 1-2 tablets by mouth every 6 hours as needed for pain and/or cough.   LEVOFLOXACIN (LEVAQUIN) 750 MG TABLET    Take 1 tablet (750 mg total) by mouth daily. X 7 days   PREDNISONE (DELTASONE) 50 MG TABLET    Take 1 tablet daily with breakfast         Monico Blitz, PA-C 10/14/14 1731  Virgel Manifold, MD 10/15/14 1459

## 2014-10-14 NOTE — ED Notes (Signed)
PA at bedside.

## 2014-10-14 NOTE — ED Notes (Addendum)
Chest pain non radiating, sharp, reproducible. Been coughing for 2 weeks; pack day smoker. Hypertension. Hx of chest wall pain reports feels the same but usually goes away after a few days. This pain has been for a week.

## 2014-10-14 NOTE — Discharge Instructions (Signed)
Please follow with your primary care doctor in the next 5 days for high blood pressure evaluation. If you do not have a primary care doctor, present to urgent care. Reduce salt intake. Seek emergency medical care for unilateral weakness, slurring, change in vision, or chest pain and shortness of breath.  Take vicodin for breakthrough pain, do not drink alcohol, drive, care for children or do other critical tasks while taking vicodin.  Do not hesitate to return to the emergency room for any new, worsening or concerning symptoms.  Please obtain primary care using resource guide below. Let them know that you were seen in the emergency room and that they will need to obtain records for further outpatient management.    Emergency Department Resource Guide 1) Find a Doctor and Pay Out of Pocket Although you won't have to find out who is covered by your insurance plan, it is a good idea to ask around and get recommendations. You will then need to call the office and see if the doctor you have chosen will accept you as a new patient and what types of options they offer for patients who are self-pay. Some doctors offer discounts or will set up payment plans for their patients who do not have insurance, but you will need to ask so you aren't surprised when you get to your appointment.  2) Contact Your Local Health Department Not all health departments have doctors that can see patients for sick visits, but many do, so it is worth a call to see if yours does. If you don't know where your local health department is, you can check in your phone book. The CDC also has a tool to help you locate your state's health department, and many state websites also have listings of all of their local health departments.  3) Find a [facility] If your illness is not likely to be very severe or complicated, you may want to try a walk in clinic. These are popping up all over the country in pharmacies, drugstores, and  shopping centers. They're usually staffed by nurse practitioners or physician assistants that have been trained to treat common illnesses and complaints. They're usually fairly quick and inexpensive. However, if you have serious medical issues or chronic medical problems, these are probably not your best option.  No Primary Care Doctor: - Call Health Connect at  [phone] - they can help you locate a primary care doctor that  accepts your insurance, provides certain services, etc. - Physician Referral Service- [phone]  Chronic Pain Problems: Organization         Address  Phone   Notes  [facility]  [phone] Patients need to be referred by their primary care doctor.   Medication Assistance: Organization         Address  Phone   Notes  [facility] Medication [facility] [address] [place], [place] [address] [phone] --Must be a resident of [facility] -- Must have NO insurance coverage whatsoever (no Medicaid/ Medicare, etc.) -- The pt. MUST have a primary care doctor that directs their care regularly and follows them in the community   MedAssist  [phone]   [employer]  [phone]    Agencies that provide inexpensive medical care: Organization         Address  Phone   Notes  [place]  [phone]   [facility]    [phone]  [facility] [place], [place] [address] [phone]   [place]. [address], [place] [phone]   Planned Parenthood    [phone]   [facility]    [phone]   [place] and [facility] [address], [place] Phone:  [phone], Fax:  [phone] Hours of Operation:  9 am - 6 pm, M-F.  Also accepts Medicaid/Medicare and self-pay.  [facility] for [place], [address], [place] Phone: [phone],  Fax: [phone]. Hours of Operation:  8:30 am - 5:30 pm, M-F.  Also accepts Medicaid and self-pay.  [facility] [place] [address] [place] Phone: [phone]   [place], [place], [place] [phone], Ext. [phone] Mondays & Thursdays: 7-9 AM.  First 15 patients are seen on a first come, first serve basis.    [place] Providers:  Organization         Address  Phone   Notes  [facility] [address], [place] [phone] Also accepts self-pay patients.  [facility] [address] [place], [place]  [phone]   [place], [address], [place] [phone]   [facility] [address], [place] [phone]   Lucianne Lei [address], [place]   [phone] Only accepts [place] Access [place] patients after they have their name applied to their card.   Self-Pay (no insurance) in [facility]:  Organization         Address  Phone   Notes  Sickle Cell Patients, [facility] [place] [phone]   [facility] [place] [phone]   [facility] [place]  [place], [place], [place] [phone]   [facility]/Dr. Osei-Bonsu  [address] [place] or [address], [place] [phone] Phone number for both [place] and [place] locations is the same.  Urgent Medical and [facility] [address], [place] [phone]   [facility] [address] [place] or [address] [phone]   [facility] [address], [place] [phone], phone; [phone], fax Sees patients 1st and 3rd Saturday of every month.  Must not qualify for public or private  insurance (i.e. Medicaid, Medicare, [place] Health Choice, Veterans' Benefits)  Household income should be no more than 200% of the poverty level The clinic cannot treat you if you are pregnant or think you are pregnant  Sexually transmitted diseases are not treated at the clinic.    Dental Care: Organization         Address  Phone  Notes  [facility] Department of [facility] [place] [phone] Accepts children up to age 32 who are enrolled in [place] or [place]; pregnant women with a Medicaid card; and children who have applied for Medicaid or [place] Health Choice, but were declined, whose parents can pay a reduced fee at time of service.  [facility] Department of [facility]  [address], [place] [phone] Accepts children up to age 74 who  are enrolled in Medicaid or [place] Health Choice; pregnant women with a Medicaid card; and children who have applied for Medicaid or [place] Health Choice, but were declined, whose parents can pay a reduced fee at time of service.  [place] Adult Dental Access PROGRAM  [place] [phone] Patients are seen by appointment only. Walk-ins are not accepted. [place] will see patients 9 years of age and older. Monday - Tuesday (8am-5pm) Most Wednesdays (8:30-5pm) $30 per visit, cash only  [facility] Adult Dental Access PROGRAM  [address], [facility] [phone] Patients are seen by appointment only. Walk-ins are not accepted. [place] will see patients 51 years of age and older. One Wednesday Evening (Monthly: Volunteer Based).  $30 per visit, cash only  [place]  [phone] for adults; Children under age 10, call Graduate Pediatric Dentistry at [phone]. Children aged 48-14, please call [phone] to request a pediatric application.  Dental services are provided in all areas of dental care  including fillings, crowns and bridges, complete and partial dentures, implants, gum treatment, root canals, and extractions. Preventive care is also provided. Treatment is provided to both adults and children. Patients are selected via a lottery and there is often a waiting list.   [facility] [address] [place]  [phone] [email or web address]   [facility] [address] [place], [place] [phone], Ext. [phone] Second and Fourth Thursday of each month, opens at 6:30 AM; Clinic ends at 9 AM.  Patients are seen on a first-come first-served basis, and a limited number are seen during each clinic.   [facility]  [address] Hillard Danker [place], [place] [phone]   Eligibility Requirements You must have lived in [place], [place], or [place] counties for at least the last three months.   You cannot be eligible for state or federal sponsored [employer], including [employer], [place], or [employer].   You generally cannot be eligible for healthcare insurance through your employer.    How to apply: Eligibility screenings are held every Tuesday and Wednesday afternoon from 1:00 pm until 4:00 pm. You do not need an appointment for the interview!  [facility] [address], [place], [place]   [place]  [place] Department  [place]  [phone]    Behavioral Health Resources in the Community: Intensive Outpatient Programs Organization         Address  Phone  Notes  [place]. [address], [place], [place] [phone]   [facility] Outpatient [address], [place], [place]   ADS: Alcohol & Drug Svcs [address], [place], [place]   [place] [address],  [place], [place] or [phone]     Substance Abuse Resources Organization         Address  Phone  Notes  Alcohol and Drug Services  [phone]   [place]  [phone]   [place]   Chinita Pester  [phone]   Residential & Outpatient Substance Abuse Program  [phone]   Psychological Services Organization         Address  Phone  Notes  [facility]  [place]  [phone]   [place] [address] [place]  [address] [place] or [phone]    Mobile Crisis Teams Organization         Address  Phone  Notes  [facility], Mobile Crisis Care Unit  [phone]   Assertive Psychotherapeutic Services  [address] [place], [place]   [place] [address], [place] [phone]    Self-Help/Support Groups Organization         Address  Phone             Notes  [place]. of [place] - variety of support groups  [place] Call for more information  Narcotics Anonymous (NA), [facility] [address], [employer] [place]  2 meetings at this location   Special educational needs teacher         Address  Phone  Notes  ASAP Residential Treatment [place],    [place]  [phone]   [facility]  [address], [place] [phone], [place], [place]   [place], [place] [phone] Admissions: 8am-3pm M-F  Incentives Substance [place] [address]    [place], [place] [phone]   [facility] [address] [place], [place], [place]   [facility] [address],  [place], [place]   [facility] Programs - Intensive Outpatient [address]een [address], [place], [place]   [name] ([place] [place].,  [place], [place] [phone] or [phone]   Residential Treatment  Services (RTS) [address] [place], [place] Accepts Medicaid  [place] [address]  [place] [phone] Substance Abuse/Addiction Treatment   [facility] Organization         Address  Phone  Notes  [facility]  [phone]   Domenic Schwab, PhD [address] Arlis Porta [place], [place]   [phone] or [phone]   [place], [place] [phone]   [facility] [address] [place], [place] [phone] Insurance/Medicaid/sponsorship through [place] and Families [address] [place]                                    [place], [place] [phone] [place] [address], [place] [phone]    Dr. Adele Schilder  [phone]   [facility] Dept. 1) [address] [place] 2) [place] 3)  [place] [address], [place] [phone]  [phone]   [place] [phone] or [phone] (After Hours)

## 2014-11-07 ENCOUNTER — Encounter (HOSPITAL_COMMUNITY): Payer: Self-pay | Admitting: *Deleted

## 2014-11-07 ENCOUNTER — Emergency Department (HOSPITAL_COMMUNITY): Payer: Self-pay

## 2014-11-07 ENCOUNTER — Emergency Department (HOSPITAL_COMMUNITY)
Admission: EM | Admit: 2014-11-07 | Discharge: 2014-11-07 | Disposition: A | Discharge status: Home / Self Care | Payer: Self-pay | Attending: Emergency Medicine | Admitting: Emergency Medicine

## 2014-11-07 DIAGNOSIS — F419 Anxiety disorder, unspecified: Secondary | ICD-10-CM | POA: Insufficient documentation

## 2014-11-07 DIAGNOSIS — D649 Anemia, unspecified: Secondary | ICD-10-CM | POA: Insufficient documentation

## 2014-11-07 DIAGNOSIS — Z86018 Personal history of other benign neoplasm: Secondary | ICD-10-CM | POA: Insufficient documentation

## 2014-11-07 DIAGNOSIS — Z720 Tobacco use: Secondary | ICD-10-CM | POA: Insufficient documentation

## 2014-11-07 DIAGNOSIS — Z8739 Personal history of other diseases of the musculoskeletal system and connective tissue: Secondary | ICD-10-CM | POA: Insufficient documentation

## 2014-11-07 DIAGNOSIS — R000 Tachycardia, unspecified: Secondary | ICD-10-CM | POA: Insufficient documentation

## 2014-11-07 DIAGNOSIS — R1030 Lower abdominal pain, unspecified: Secondary | ICD-10-CM | POA: Insufficient documentation

## 2014-11-07 DIAGNOSIS — G43909 Migraine, unspecified, not intractable, without status migrainosus: Secondary | ICD-10-CM | POA: Insufficient documentation

## 2014-11-07 DIAGNOSIS — Z8719 Personal history of other diseases of the digestive system: Secondary | ICD-10-CM | POA: Insufficient documentation

## 2014-11-07 DIAGNOSIS — Z79899 Other long term (current) drug therapy: Secondary | ICD-10-CM | POA: Insufficient documentation

## 2014-11-07 DIAGNOSIS — Z7952 Long term (current) use of systemic steroids: Secondary | ICD-10-CM | POA: Insufficient documentation

## 2014-11-07 DIAGNOSIS — Z79891 Long term (current) use of opiate analgesic: Secondary | ICD-10-CM | POA: Insufficient documentation

## 2014-11-07 DIAGNOSIS — F329 Major depressive disorder, single episode, unspecified: Secondary | ICD-10-CM | POA: Insufficient documentation

## 2014-11-07 DIAGNOSIS — R52 Pain, unspecified: Secondary | ICD-10-CM

## 2014-11-07 DIAGNOSIS — Z793 Long term (current) use of hormonal contraceptives: Secondary | ICD-10-CM | POA: Insufficient documentation

## 2014-11-07 DIAGNOSIS — N938 Other specified abnormal uterine and vaginal bleeding: Secondary | ICD-10-CM | POA: Insufficient documentation

## 2014-11-07 LAB — CBC
HEMATOCRIT: 29.6 % — AB (ref 36.0–46.0)
Hemoglobin: 8.7 g/dL — ABNORMAL LOW (ref 12.0–15.0)
MCH: 23 pg — ABNORMAL LOW (ref 26.0–34.0)
MCHC: 29.4 g/dL — AB (ref 30.0–36.0)
MCV: 78.3 fL (ref 78.0–100.0)
Platelets: 325 10*3/uL (ref 150–400)
RBC: 3.78 MIL/uL — ABNORMAL LOW (ref 3.87–5.11)
RDW: 19.4 % — ABNORMAL HIGH (ref 11.5–15.5)
WBC: 7 10*3/uL (ref 4.0–10.5)

## 2014-11-07 LAB — COMPREHENSIVE METABOLIC PANEL
ALT: 19 U/L (ref 14–54)
AST: 28 U/L (ref 15–41)
Albumin: 3.4 g/dL — ABNORMAL LOW (ref 3.5–5.0)
Alkaline Phosphatase: 107 U/L (ref 38–126)
Anion gap: 7 (ref 5–15)
BILIRUBIN TOTAL: 0.3 mg/dL (ref 0.3–1.2)
BUN: 7 mg/dL (ref 6–20)
CO2: 24 mmol/L (ref 22–32)
Calcium: 8.8 mg/dL — ABNORMAL LOW (ref 8.9–10.3)
Chloride: 107 mmol/L (ref 101–111)
Creatinine, Ser: 0.67 mg/dL (ref 0.44–1.00)
GFR calc Af Amer: >60 mL/min (ref >60)
Glucose, Bld: 98 mg/dL (ref 65–99)
POTASSIUM: 3.6 mmol/L (ref 3.5–5.1)
Sodium: 138 mmol/L (ref 135–145)
TOTAL PROTEIN: 6.8 g/dL (ref 6.5–8.1)

## 2014-11-07 LAB — I-STAT BETA HCG BLOOD, ED (MC, WL, AP ONLY): I-stat hCG, quantitative: <5 m[IU]/mL (ref <5)

## 2014-11-07 LAB — URINALYSIS W MICROSCOPIC (NOT AT ARMC)
Bilirubin Urine: NEGATIVE
Glucose, UA: NEGATIVE mg/dL
KETONES UR: NEGATIVE mg/dL
Nitrite: NEGATIVE
Protein, ur: NEGATIVE mg/dL
Specific Gravity, Urine: 1.023 (ref 1.005–1.030)
Urobilinogen, UA: 1 mg/dL (ref 0.0–1.0)
pH: 6 (ref 5.0–8.0)

## 2014-11-07 LAB — LIPASE, BLOOD: Lipase: 48 U/L (ref 22–51)

## 2014-11-07 MED ORDER — MEDROXYPROGESTERONE ACETATE 5 MG PO TABS: 10.0000 mg | ORAL_TABLET | Freq: Every day | ORAL | Status: DC

## 2014-11-07 MED ORDER — SODIUM CHLORIDE 0.9 % IV BOLUS (SEPSIS)
1000.0000 mL | Freq: Once | INTRAVENOUS | Status: AC
  Administered 2014-11-07: 1000 mL via INTRAVENOUS

## 2014-11-07 MED ORDER — HYDROCODONE-ACETAMINOPHEN 5-325 MG PO TABS
1.0000 | ORAL_TABLET | Freq: Once | ORAL | Status: AC
  Administered 2014-11-07: 1 via ORAL
  Filled 2014-11-07: qty 1

## 2014-11-07 MED ORDER — MEDROXYPROGESTERONE ACETATE 10 MG PO TABS
10.0000 mg | ORAL_TABLET | Freq: Every day | ORAL | Status: DC
  Administered 2014-11-07: 10 mg via ORAL
  Filled 2014-11-07 (×2): qty 1

## 2014-11-07 MED ORDER — ONDANSETRON HCL 4 MG/2ML IJ SOLN
4.0000 mg | Freq: Once | INTRAMUSCULAR | Status: AC
  Administered 2014-11-07: 4 mg via INTRAVENOUS
  Filled 2014-11-07: qty 2

## 2014-11-07 MED ORDER — KETOROLAC TROMETHAMINE 30 MG/ML IJ SOLN
30.0000 mg | Freq: Once | INTRAMUSCULAR | Status: AC
  Administered 2014-11-07: 30 mg via INTRAVENOUS
  Filled 2014-11-07: qty 1

## 2014-11-07 NOTE — Discharge Instructions (Signed)
Please call your OB/GYN doctor tomorrow to set up follow-up within the next week. Please return without fail for worsening symptoms, including worsening vaginal bleeding (continued > 1 pad per hour), passing out or feeling worsening lightheadedness, worsening pain, or any other symptoms concerning to you.  Abnormal Uterine Bleeding Abnormal uterine bleeding can affect women at various stages in life, including teenagers, women in their reproductive years, pregnant women, and women who have reached menopause. Several kinds of uterine bleeding are considered abnormal, including:  Bleeding or spotting between periods.   Bleeding after sexual intercourse.   Bleeding that is heavier or more than normal.   Periods that last longer than usual.  Bleeding after menopause.  Many cases of abnormal uterine bleeding are minor and simple to treat, while others are more serious. Any type of abnormal bleeding should be evaluated by your health care provider. Treatment will depend on the cause of the bleeding. HOME CARE INSTRUCTIONS Monitor your condition for any changes. The following actions may help to alleviate any discomfort you are experiencing:  Avoid the use of tampons and douches as directed by your health care provider.  Change your pads frequently. You should get regular pelvic exams and Pap tests. Keep all follow-up appointments for diagnostic tests as directed by your health care provider.  SEEK MEDICAL CARE IF:   Your bleeding lasts more than 1 week.   You feel dizzy at times.  SEEK IMMEDIATE MEDICAL CARE IF:   You pass out.   You are changing pads every 15 to 30 minutes.   You have abdominal pain.  You have a fever.   You become sweaty or weak.   You are passing large blood clots from the vagina.   You start to feel nauseous and vomit. MAKE SURE YOU:   Understand these instructions.  Will watch your condition.  Will get help right away if you are not doing  well or get worse. Document Released: 02/28/2005 Document Revised: 03/05/2013 Document Reviewed: 09/27/2012 [facility] Patient Information 2015 [place], [place]. This information is not intended to replace advice given to you by your health care provider. Make sure you discuss any questions you have with your health care provider.

## 2014-11-07 NOTE — ED Notes (Signed)
Pt is aware urine is needed, pt is unable to urinate at this time. 

## 2014-11-07 NOTE — ED Provider Notes (Signed)
CSN: [id number]     Arrival date & time 11/07/14  1305 History   First MD Initiated Contact with Patient 11/07/14 1456     Chief Complaint  Patient presents with  . Abdominal Pain  . Vaginal Bleeding     (Consider location/radiation/quality/duration/timing/severity/associated sxs/prior Treatment) HPI 39 year old female who presents with vaginal bleeding and low abdominal pain/back pain. Starting having some vaginal spotting last night, but this morning developed more extensive bleeding. Passing blood cots, and have gone through 4 pads within one hour period. Reports associated lightheadedness, but denies syncope, chest pain, sob, DOE. Denies any associating nausea, vomiting, fevers, chills, abnormal vaginal discharge, dysuria or urinary frequency, melena or hematochezia. History of fibroids which required OCPs in the past, but off of medications for several years and not had any issues. She reports that this feels similar to when she has had fibroids and vaginal bleeding associated with her fibroids. History of cesarean section in the past. LMP 10/21/14   Past Medical History  Diagnosis Date  . Depression   . GERD (gastroesophageal reflux disease)   . Migraine   . Gout     ble  . Depression   . Migraine   . Anxiety    Past Surgical History  Procedure Laterality Date  . Cesarean section  infection at incission requiring return to OR x 2  . Tubal ligation     Family History  Problem Relation Age of Onset  . Anesthesia problems Neg Hx   . Hypotension Neg Hx   . Malignant hyperthermia Neg Hx   . Pseudochol deficiency Neg Hx   . Hypertension Mother   . Arthritis Sister    Social History  Substance Use Topics  . Smoking status: Current Every Day Smoker -- 1.00 packs/day for 20 years    Types: Cigarettes  . Smokeless tobacco: Never Used     Comment: Currently using Nicotine patch  . Alcohol Use: 0.0 oz/week     Comment: socially   OB History    Gravida Para Term Preterm AB  TAB SAB Ectopic Multiple Living   3 3        3      Review of Systems 10/14 systems reviewed and are negative other than those stated in the HPI   Allergies  Review of patient's allergies indicates no known allergies.  Home Medications   Prior to Admission medications   Medication Sig Start Date End Date Taking? Authorizing Provider  Acetaminophen-Caff-Pyrilamine (MIDOL COMPLETE PO) Take 2 tablets by mouth 2 (two) times daily as needed (menstrual cramps).    Yes Historical Provider, MD  albuterol (PROVENTIL HFA;VENTOLIN HFA) 108 (90 BASE) MCG/ACT inhaler Inhale 1-2 puffs into the lungs every 4 (four) hours as needed for wheezing or shortness of breath (cough). 11/21/13  Yes Dahlia Bailiff, PA-C  carbamazepine (TEGRETOL XR) 200 MG 12 hr tablet Take 1 tablet (200 mg total) by mouth 2 (two) times daily. Mood stabilization. 06/08/13  Yes Patrecia Pour, NP  citalopram (CELEXA) 20 MG tablet Take 20 mg by mouth daily.   Yes Historical Provider, MD  gabapentin (NEURONTIN) 100 MG capsule Take 1 capsule (100 mg total) by mouth 3 (three) times daily. Anxiety, neuropathic pain. 06/08/13  Yes Patrecia Pour, NP  hydrOXYzine (ATARAX/VISTARIL) 10 MG tablet Take 1 tablet (10 mg total) by mouth every 6 (six) hours as needed for itching. Patient taking differently: Take 10 mg by mouth daily after lunch.  09/21/14  Yes Montine Circle, PA-C  ibuprofen (ADVIL,MOTRIN) 600 MG tablet Take 1 tablet (600 mg total) by mouth every 6 (six) hours as needed. 09/28/14  Yes Tori Milks, MD  risperiDONE (RISPERDAL) 1 MG tablet Take 1 tablet (1 mg total) by mouth at bedtime. 06/08/13  Yes Patrecia Pour, NP  traZODone (DESYREL) 100 MG tablet Take 1 tablet (100 mg total) by mouth at bedtime as needed for sleep. Patient taking differently: Take 100 mg by mouth at bedtime.  06/08/13  Yes Patrecia Pour, NP  HYDROcodone-acetaminophen (NORCO/VICODIN) 5-325 MG per tablet Take 1-2 tablets by mouth every 6 hours as needed for pain and/or  cough. 10/14/14   Nicole Pisciotta, PA-C  levofloxacin (LEVAQUIN) 750 MG tablet Take 1 tablet (750 mg total) by mouth daily. X 7 days 10/14/14   Monico Blitz, PA-C  medroxyPROGESTERone (PROVERA) 5 MG tablet Take 2 tablets (10 mg total) by mouth daily. 11/07/14   Forde Dandy, MD  oxyCODONE (OXY IR/ROXICODONE) 5 MG immediate release tablet Take 1 tablet (5 mg total) by mouth once. 09/28/14   Tori Milks, MD  oxyCODONE-acetaminophen (PERCOCET/ROXICET) 5-325 MG per tablet Take 1-2 tablets by mouth every 6 (six) hours as needed for severe pain. Patient not taking: Reported on 09/28/2014 05/29/14   Luvenia Redden, PA-C  predniSONE (DELTASONE) 50 MG tablet Take 1 tablet daily with breakfast 10/14/14   Elmyra Ricks Pisciotta, PA-C   BP 134/86 mmHg  Pulse 77  Temp(Src) 98.4 F (36.9 C) (Oral)  Resp 18  Ht 5\' 2"  (1.575 m)  Wt 175 lb (79.379 kg)  BMI 32.00 kg/m2  SpO2 99%  LMP 10/21/2014 Physical Exam Physical Exam  Nursing note and vitals reviewed. Constitutional: Well developed, well nourished, non-toxic, and in no acute distress Head: Normocephalic and atraumatic.  Mouth/Throat: Oropharynx is clear and moist.  Neck: Normal range of motion. Neck supple.  Cardiovascular: Tachycardic rate and regular rhythm.  No edema. Pulmonary/Chest: Effort normal and breath sounds normal.  Abdominal: Soft. There is suprapubic tenderness to palpation. No tenderness at McBurney's point. There is no rebound and no guarding. No CVA tenderness. Musculoskeletal: Normal range of motion.  Neurological: Alert, no facial droop, fluent speech, moves all extremities symmetrically Skin: Skin is warm and dry.  Psychiatric: Cooperative Pelvic: Normal external genitalia. Normal internal genitalia. No discharge. Clot and blood within the vagina. Cervix appears closed with slow ooze of bleeding. No cervical motion tenderness. No adnexal masses or tenderness. Suprapubic tenderness to palpation.   ED Course  Procedures (including  critical care time) Labs Review Labs Reviewed  COMPREHENSIVE METABOLIC PANEL - Abnormal; Notable for the following:    Calcium 8.8 (*)    Albumin 3.4 (*)    All other components within normal limits  CBC - Abnormal; Notable for the following:    RBC 3.78 (*)    Hemoglobin 8.7 (*)    HCT 29.6 (*)    MCH 23.0 (*)    MCHC 29.4 (*)    RDW 19.4 (*)    All other components within normal limits  URINALYSIS W MICROSCOPIC - Abnormal; Notable for the following:    Color, Urine AMBER (*)    Hgb urine dipstick LARGE (*)    Leukocytes, UA SMALL (*)    All other components within normal limits  LIPASE, BLOOD  I-STAT BETA HCG BLOOD, ED ([facility], [facility], [facility] ONLY)    Imaging Review US Transvaginal Non-ob  11/07/2014   CLINICAL DATA:  Pelvic pain and menorrhagia  EXAM: TRANSABDOMINAL AND TRANSVAGINAL ULTRASOUND OF PELVIS  TECHNIQUE: Both transabdominal and transvaginal ultrasound examinations of the pelvis were performed. Transabdominal technique was performed for global imaging of the pelvis including uterus, ovaries, adnexal regions, and pelvic cul-de-sac. It was necessary to proceed with endovaginal exam following the transabdominal exam to visualize the ovaries and endometrium.  COMPARISON:  05/23/2014  FINDINGS: Uterus  Measurements: 8.7 x 4.8 x 5.9 cm. No fibroids or other mass visualized.  Endometrium  Thickness: 6.2 mm.  No focal abnormality visualized.  Right ovary  Measurements: 3.7 x 1.8 x 1.9 cm. Normal appearance/no adnexal mass.  Left ovary  Measurements: 3.6 x 2.0 x 3.0 cm. Normal appearance/no adnexal mass.  Other findings  No free fluid.  IMPRESSION: Unremarkable pelvic ultrasound examination. No uterine fibroids or endometrial abnormality is identified to account for the patient's menorrhagia.   Electronically Signed   By: Marijo Sanes M.D.   On: 11/07/2014 16:56   US Pelvis Complete  11/07/2014   CLINICAL DATA:  Pelvic pain and menorrhagia  EXAM: TRANSABDOMINAL AND TRANSVAGINAL ULTRASOUND OF  PELVIS  TECHNIQUE: Both transabdominal and transvaginal ultrasound examinations of the pelvis were performed. Transabdominal technique was performed for global imaging of the pelvis including uterus, ovaries, adnexal regions, and pelvic cul-de-sac. It was necessary to proceed with endovaginal exam following the transabdominal exam to visualize the ovaries and endometrium.  COMPARISON:  05/23/2014  FINDINGS: Uterus  Measurements: 8.7 x 4.8 x 5.9 cm. No fibroids or other mass visualized.  Endometrium  Thickness: 6.2 mm.  No focal abnormality visualized.  Right ovary  Measurements: 3.7 x 1.8 x 1.9 cm. Normal appearance/no adnexal mass.  Left ovary  Measurements: 3.6 x 2.0 x 3.0 cm. Normal appearance/no adnexal mass.  Other findings  No free fluid.  IMPRESSION: Unremarkable pelvic ultrasound examination. No uterine fibroids or endometrial abnormality is identified to account for the patient's menorrhagia.   Electronically Signed   By: Marijo Sanes M.D.   On: 11/07/2014 16:56   I have personally reviewed and evaluated these images and lab results as part of my medical decision-making.   EKG Interpretation None      MDM   Final diagnoses:  Dysfunctional uterine bleeding   In short, this is a 39 year old female with prior history of cesarean section and fibroids who presents with low abdominal cramping and vaginal bleeding for 1 day. She is nontoxic in no acute distress on presentation. She has mild tachycardia 102, but is normotensive. Her abdomen is soft and benign, with minimal tenderness suprapubically. Pelvic exam reveals some scant blood and clots within her vagina, and slow oozing of blood from her cervical os. Pelvic ultrasound reveals no acute intrapelvic processes including fibroids or other etiologies of her bleeding. On repeat exam her abdomen continues to be benign, and I suspect that this is all due to uterine cramping/contractions secondary to her dysfunctional uterine bleeding. Basic blood  work reveals anemia of 8.7.  When compared to prior hemoglobins earlier this month, this is her baseline. I discussed this patient with OB/GYN physician on-call, and recommended medroxyprogesterone versus Megace for control of her bleeding prior to outpatient follow-up. She is given a 14 day course of medroxyprogesterone. Strict return and follow-up instructions are reviewed with her. She will follow-up with her gynecologist at the [facility] for close follow-up.    Forde Dandy, MD 11/07/14 [phone]

## 2014-11-07 NOTE — ED Notes (Signed)
Pt reports vaginal bleed ing and lower abdominal pain since last night. Pt states that last normal period was 8-9. Pt states that she has hx of fibroids.

## 2014-12-20 ENCOUNTER — Emergency Department (HOSPITAL_COMMUNITY): Payer: Self-pay

## 2014-12-20 ENCOUNTER — Encounter (HOSPITAL_COMMUNITY): Payer: Self-pay | Admitting: Emergency Medicine

## 2014-12-20 ENCOUNTER — Emergency Department (HOSPITAL_COMMUNITY)
Admission: EM | Admit: 2014-12-20 | Discharge: 2014-12-21 | Disposition: A | Discharge status: Home / Self Care | Payer: Self-pay | Attending: Emergency Medicine | Admitting: Emergency Medicine

## 2014-12-20 DIAGNOSIS — R0789 Other chest pain: Secondary | ICD-10-CM | POA: Insufficient documentation

## 2014-12-20 DIAGNOSIS — R05 Cough: Secondary | ICD-10-CM

## 2014-12-20 DIAGNOSIS — D509 Iron deficiency anemia, unspecified: Secondary | ICD-10-CM | POA: Insufficient documentation

## 2014-12-20 DIAGNOSIS — J45909 Unspecified asthma, uncomplicated: Secondary | ICD-10-CM | POA: Insufficient documentation

## 2014-12-20 DIAGNOSIS — Z720 Tobacco use: Secondary | ICD-10-CM | POA: Insufficient documentation

## 2014-12-20 DIAGNOSIS — Z3202 Encounter for pregnancy test, result negative: Secondary | ICD-10-CM | POA: Insufficient documentation

## 2014-12-20 DIAGNOSIS — F419 Anxiety disorder, unspecified: Secondary | ICD-10-CM | POA: Insufficient documentation

## 2014-12-20 DIAGNOSIS — K219 Gastro-esophageal reflux disease without esophagitis: Secondary | ICD-10-CM | POA: Insufficient documentation

## 2014-12-20 DIAGNOSIS — R059 Cough, unspecified: Secondary | ICD-10-CM

## 2014-12-20 DIAGNOSIS — G43909 Migraine, unspecified, not intractable, without status migrainosus: Secondary | ICD-10-CM | POA: Insufficient documentation

## 2014-12-20 DIAGNOSIS — F329 Major depressive disorder, single episode, unspecified: Secondary | ICD-10-CM | POA: Insufficient documentation

## 2014-12-20 DIAGNOSIS — Z79899 Other long term (current) drug therapy: Secondary | ICD-10-CM | POA: Insufficient documentation

## 2014-12-20 DIAGNOSIS — J209 Acute bronchitis, unspecified: Secondary | ICD-10-CM

## 2014-12-20 LAB — BASIC METABOLIC PANEL
ANION GAP: 9 (ref 5–15)
BUN: 7 mg/dL (ref 6–20)
CHLORIDE: 104 mmol/L (ref 101–111)
CO2: 22 mmol/L (ref 22–32)
Calcium: 9.1 mg/dL (ref 8.9–10.3)
Creatinine, Ser: 0.7 mg/dL (ref 0.44–1.00)
GFR calc Af Amer: >60 mL/min (ref >60)
Glucose, Bld: 141 mg/dL — ABNORMAL HIGH (ref 65–99)
POTASSIUM: 3.8 mmol/L (ref 3.5–5.1)
SODIUM: 135 mmol/L (ref 135–145)

## 2014-12-20 LAB — CBC
HEMATOCRIT: 29 % — AB (ref 36.0–46.0)
HEMOGLOBIN: 8.1 g/dL — AB (ref 12.0–15.0)
MCH: 20.5 pg — ABNORMAL LOW (ref 26.0–34.0)
MCHC: 27.9 g/dL — ABNORMAL LOW (ref 30.0–36.0)
MCV: 73.2 fL — AB (ref 78.0–100.0)
Platelets: 435 10*3/uL — ABNORMAL HIGH (ref 150–400)
RBC: 3.96 MIL/uL (ref 3.87–5.11)
RDW: 17.9 % — ABNORMAL HIGH (ref 11.5–15.5)
WBC: 8.2 10*3/uL (ref 4.0–10.5)

## 2014-12-20 LAB — I-STAT TROPONIN, ED: Troponin i, poc: 0.01 ng/mL (ref 0.00–0.08)

## 2014-12-20 MED ORDER — ALBUTEROL SULFATE (2.5 MG/3ML) 0.083% IN NEBU
5.0000 mg | INHALATION_SOLUTION | Freq: Once | RESPIRATORY_TRACT | Status: AC
  Administered 2014-12-20: 5 mg via RESPIRATORY_TRACT
  Filled 2014-12-20: qty 6

## 2014-12-20 MED ORDER — PREDNISONE 10 MG PO TABS: 40.0000 mg | ORAL_TABLET | Freq: Every day | ORAL | Status: DC

## 2014-12-20 MED ORDER — AZITHROMYCIN 250 MG PO TABS: 250.0000 mg | ORAL_TABLET | Freq: Every day | ORAL | Status: DC

## 2014-12-20 MED ORDER — ACETAMINOPHEN 500 MG PO TABS
500.0000 mg | ORAL_TABLET | Freq: Once | ORAL | Status: AC
  Administered 2014-12-20: 500 mg via ORAL
  Filled 2014-12-20: qty 1

## 2014-12-20 MED ORDER — IOHEXOL 350 MG/ML SOLN
100.0000 mL | Freq: Once | INTRAVENOUS | Status: AC | PRN
  Administered 2014-12-20: 100 mL via INTRAVENOUS

## 2014-12-20 MED ORDER — ALBUTEROL SULFATE HFA 108 (90 BASE) MCG/ACT IN AERS: 2.0000 | INHALATION_SPRAY | RESPIRATORY_TRACT | Status: DC | PRN

## 2014-12-20 MED ORDER — AZITHROMYCIN 250 MG PO TABS
500.0000 mg | ORAL_TABLET | Freq: Once | ORAL | Status: AC
  Administered 2014-12-21: 500 mg via ORAL
  Filled 2014-12-20: qty 2

## 2014-12-20 MED ORDER — ALBUTEROL SULFATE HFA 108 (90 BASE) MCG/ACT IN AERS
2.0000 | INHALATION_SPRAY | RESPIRATORY_TRACT | Status: DC | PRN
  Administered 2014-12-21: 2 via RESPIRATORY_TRACT
  Filled 2014-12-20: qty 6.7

## 2014-12-20 MED ORDER — PREDNISONE 20 MG PO TABS
40.0000 mg | ORAL_TABLET | Freq: Once | ORAL | Status: AC
  Administered 2014-12-20: 40 mg via ORAL
  Filled 2014-12-20: qty 2

## 2014-12-20 MED ORDER — FERROUS FUMARATE 325 (106 FE) MG PO TABS
1.0000 | ORAL_TABLET | Freq: Once | ORAL | Status: AC
  Administered 2014-12-20: 106 mg via ORAL
  Filled 2014-12-20: qty 1

## 2014-12-20 NOTE — ED Provider Notes (Signed)
CSN: [id number]     Arrival date & time 12/20/14  2013 History   First MD Initiated Contact with Patient 12/20/14 2104     Chief Complaint  Patient presents with  . Chest Pain     (Consider location/radiation/quality/duration/timing/severity/associated sxs/prior Treatment) HPI  Gina Shelton is a 39 y.o. female, history of Migraine and GERD, presents with generalized chest soreness localized around her ribs that began about two weeks ago.  Pt states the soreness is also in her back and both areas of pain came on after she began coughing regularly. Pt states she has frequently URIs, but typically can not afford the antibiotics. Pt states this soreness is consistent with her previous URI's. Pt is an everyday smoker and recently began OCPs.   Past Medical History  Diagnosis Date  . Depression   . GERD (gastroesophageal reflux disease)   . Migraine   . Gout     ble  . Depression   . Migraine   . Anxiety    Past Surgical History  Procedure Laterality Date  . Cesarean section  infection at incission requiring return to OR x 2  . Tubal ligation     Family History  Problem Relation Age of Onset  . Anesthesia problems Neg Hx   . Hypotension Neg Hx   . Malignant hyperthermia Neg Hx   . Pseudochol deficiency Neg Hx   . Hypertension Mother   . Arthritis Sister    Social History  Substance Use Topics  . Smoking status: Current Every Day Smoker -- 0.00 packs/day for 0 years    Types: Cigarettes  . Smokeless tobacco: Never Used     Comment: Currently using Nicotine patch  . Alcohol Use: 0.0 oz/week   OB History    Gravida Para Term Preterm AB TAB SAB Ectopic Multiple Living   3 3        3      Review of Systems  Constitutional: Negative for fever, chills, diaphoresis and unexpected weight change.  HENT: Positive for congestion.   Respiratory: Positive for cough. Negative for chest tightness and shortness of breath.   Cardiovascular: Positive for chest pain. Negative for  palpitations and leg swelling.  Gastrointestinal: Negative for nausea, vomiting, abdominal pain, diarrhea and constipation.  Genitourinary: Negative for dysuria and flank pain.  Musculoskeletal: Negative for back pain.  Skin: Negative for color change and pallor.  Neurological: Negative for dizziness, syncope, weakness and light-headedness.  All other systems reviewed and are negative.     Allergies  Review of patient's allergies indicates no known allergies.  Home Medications   Prior to Admission medications   Medication Sig Start Date End Date Taking? Authorizing Provider  Acetaminophen-Caff-Pyrilamine (MIDOL COMPLETE PO) Take 2 tablets by mouth 2 (two) times daily as needed (menstrual cramps).    Yes Historical Provider, MD  albuterol (PROVENTIL HFA;VENTOLIN HFA) 108 (90 BASE) MCG/ACT inhaler Inhale 1-2 puffs into the lungs every 4 (four) hours as needed for wheezing or shortness of breath (cough). 11/21/13  Yes Dahlia Bailiff, PA-C  carbamazepine (TEGRETOL XR) 200 MG 12 hr tablet Take 1 tablet (200 mg total) by mouth 2 (two) times daily. Mood stabilization. 06/08/13  Yes Patrecia Pour, NP  citalopram (CELEXA) 20 MG tablet Take 20 mg by mouth daily.   Yes Historical Provider, MD  gabapentin (NEURONTIN) 100 MG capsule Take 1 capsule (100 mg total) by mouth 3 (three) times daily. Anxiety, neuropathic pain. 06/08/13  Yes Patrecia Pour, NP  hydrOXYzine (ATARAX/VISTARIL)  10 MG tablet Take 1 tablet (10 mg total) by mouth every 6 (six) hours as needed for itching. Patient taking differently: Take 10 mg by mouth daily after lunch.  09/21/14  Yes Montine Circle, PA-C  medroxyPROGESTERone (PROVERA) 5 MG tablet Take 2 tablets (10 mg total) by mouth daily. 11/07/14  Yes Forde Dandy, MD  oxyCODONE (OXY IR/ROXICODONE) 5 MG immediate release tablet Take 1 tablet (5 mg total) by mouth once. Patient taking differently: Take 5 mg by mouth once. Daily 09/28/14  Yes Tori Milks, MD  oxyCODONE-acetaminophen  (PERCOCET/ROXICET) 5-325 MG per tablet Take 1-2 tablets by mouth every 6 (six) hours as needed for severe pain. 05/29/14  Yes Luvenia Redden, PA-C  risperiDONE (RISPERDAL) 1 MG tablet Take 1 tablet (1 mg total) by mouth at bedtime. 06/08/13  Yes Patrecia Pour, NP  traZODone (DESYREL) 100 MG tablet Take 1 tablet (100 mg total) by mouth at bedtime as needed for sleep. Patient taking differently: Take 100 mg by mouth at bedtime.  06/08/13  Yes Patrecia Pour, NP  albuterol (PROVENTIL HFA;VENTOLIN HFA) 108 (90 BASE) MCG/ACT inhaler Inhale 2 puffs into the lungs every 4 (four) hours as needed for wheezing or shortness of breath. 12/20/14   Shawn C Joy, PA-C  benzonatate (TESSALON) 100 MG capsule Take 1 capsule (100 mg total) by mouth every 8 (eight) hours as needed for cough. 12/21/14   Shawn C Joy, PA-C  predniSONE (DELTASONE) 10 MG tablet Take 4 tablets (40 mg total) by mouth daily. 12/20/14   Shawn C Joy, PA-C  sulfamethoxazole-trimethoprim (BACTRIM DS,SEPTRA DS) 800-160 MG tablet Take 1 tablet by mouth 2 (two) times daily. 12/20/14 12/27/14  Shawn C Joy, PA-C   BP 123/72 mmHg  Pulse 97  Temp(Src) 98.8 F (37.1 C) (Oral)  Resp 18  SpO2 100%  LMP 11/21/2014 Physical Exam  Constitutional: She is oriented to person, place, and time. She appears well-developed and well-nourished. No distress.  HENT:  Head: Normocephalic and atraumatic.  Eyes: Conjunctivae and EOM are normal. Pupils are equal, round, and reactive to light.  Cardiovascular: Normal rate, regular rhythm and normal heart sounds.   Pulmonary/Chest: Effort normal and breath sounds normal. No accessory muscle usage. No tachypnea. No respiratory distress.  Chest soreness reproducible on palpation.  Abdominal: Soft. Bowel sounds are normal.  Musculoskeletal: She exhibits no edema or tenderness.  Neurological: She is alert and oriented to person, place, and time.  Skin: Skin is warm and dry. She is not diaphoretic.  Nursing note and vitals  reviewed.   ED Course  Procedures (including critical care time) Labs Review Labs Reviewed  BASIC METABOLIC PANEL - Abnormal; Notable for the following:    Glucose, Bld 141 (*)    All other components within normal limits  CBC - Abnormal; Notable for the following:    Hemoglobin 8.1 (*)    HCT 29.0 (*)    MCV 73.2 (*)    MCH 20.5 (*)    MCHC 27.9 (*)    RDW 17.9 (*)    Platelets 435 (*)    All other components within normal limits  I-STAT TROPOININ, ED  I-STAT BETA HCG BLOOD, ED ([facility], [facility], [facility] ONLY)    Imaging Review Dg Chest 2 View  12/20/2014   CLINICAL DATA:  39 year old with 3-4 week history of intermittent chest pain radiating into the shoulders and neck, cough, chest congestion, shortness of breath and nausea/vomiting. Smoker with current history of asthma.  EXAM: CHEST  2  VIEW  COMPARISON:  10/14/2014 and earlier.  FINDINGS: Cardiomediastinal silhouette unremarkable, unchanged. Lungs clear. Bronchovascular markings normal. Pulmonary vascularity normal. No visible pleural effusions. No pneumothorax. Visualized bony thorax intact.  IMPRESSION: Normal examination.   Electronically Signed   By: Evangeline Dakin M.D.   On: 12/20/2014 21:28   Ct Angio Chest Pe W/cm &/or Wo Cm  12/20/2014   CLINICAL DATA:  Acute onset of shortness of breath, cough and chest pain. Initial encounter.  EXAM: CT ANGIOGRAPHY CHEST WITH CONTRAST  TECHNIQUE: Multidetector CT imaging of the chest was performed using the standard protocol during bolus administration of intravenous contrast. Multiplanar CT image reconstructions and MIPs were obtained to evaluate the vascular anatomy.  CONTRAST:  120mL OMNIPAQUE IOHEXOL 350 MG/ML SOLN  COMPARISON:  Chest radiograph performed earlier today at 9:11 p.m., and CT of the abdomen and pelvis from 10/06/2007  FINDINGS: There is no evidence of pulmonary embolus.  A few tiny peripheral blebs are noted within both lungs. The lungs are otherwise clear. There is no evidence of  significant focal consolidation, pleural effusion or pneumothorax. No masses are identified; no abnormal focal contrast enhancement is seen.  The mediastinum is unremarkable in appearance. No mediastinal lymphadenopathy is seen. The great vessels are grossly unremarkable in appearance. No pericardial effusion is identified. No axillary lymphadenopathy is seen. The visualized portions of the thyroid gland are unremarkable in appearance.  There is a mildly complex large hypoattenuating lesion within the right hepatic lobe, measuring 6.5 cm, not well assessed given the phase of contrast enhancement. On correlation with prior CT, this appears to reflect an enlarging hemangioma. The visualized portions of the liver and spleen are otherwise unremarkable. The visualized portions of the pancreas are within normal limits.  No acute osseous abnormalities are seen.  Review of the MIP images confirms the above findings.  IMPRESSION: 1. No evidence of pulmonary embolus. 2. Few tiny peripheral blebs within both lungs. Lungs otherwise clear. 3. 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   Electronically Signed   By: Garald Balding M.D.   On: 12/20/2014 23:58   I have personally reviewed and evaluated these images and lab results as part of my medical decision-making.   EKG Interpretation   Date/Time:  Saturday December 20 2014 20:21:12 EDT Ventricular Rate:  106 PR Interval:  112 QRS Duration: 82 QT Interval:  334 QTC Calculation: 443 R Axis:   74 Text Interpretation:  Sinus tachycardia Otherwise normal ECG since last  tracing no significant change Confirmed by MILLER  MD, BRIAN ([id number]) on  12/20/2014 8:47:25 PM      MDM   Final diagnoses:  Cough  Microcytic anemia  Chest wall pain  Bronchitis with asthma, acute    Gina Shelton presents with chest and back soreness with cough, nausea, and vomiting for about two weeks.   Findings  and plan of care discussed with Noemi Chapel, MD.   9:46 PM Blood pressure 141/85, pulse 105, temperature 98.8 F (37.1 C), temperature source Oral, resp. rate 16, last menstrual period 11/21/2014, SpO2 100 %. Pt is low risk Wells criteria with the only abnormality found was tachycardia. EKG shows sinus tachycardia. CXR shows no abnormalities. Pt did, however, recently begin OCP last month and then had vaginal bleeding everyday for a month. Pt is also an pack-a-day smoker. Due to these risk factors, will order CTA.  CBC reveals Hgb of 8.1, which is a slight drop from  previous. Pt has no obvious sources of bleeding, other than patient's month-long period. Denies melena, hematochezia, hemoptysis, hememesis. Pt will be given an iron supplement and then referred for outpatient workup for anemia.  Troponin negative.  10:40 PM Pt states she feels much better after the albuterol and prednisone. Pt began complaining of a headache, tylenol was ordered. Pt updated on plan of care, which patient agreed to.  Should Chest CTA come back negative without signs of PE, pt will be discharged with antibiotic (due to previous mention of possible airway disease and complaint of productive cough), a 5 day course of prednisone, and replacement albuterol inhaler. Pt will be instructed to follow up with PCP as needed and return to ED should symptoms worsen.  11:52 PM Pt has returned from CTA.  Awaiting radiologist interpretation.  Pt still feels much better than she did when she arrived.  12:04 AM CTA results show no signs of PE. Pt discharged with prescriptions for Bactrim DS (so she could have a ABX on the [facility] $4 list), Tessalon, Prednisone, and for a replacement albuterol inhaler.  Lorayne Bender, PA-C 12/21/14 0006  Noemi Chapel, MD 12/21/14 [phone]

## 2014-12-20 NOTE — Discharge Instructions (Signed)
You have been seen today for chest soreness, cough, nausea, and vomiting. Your labs and imaging showed no abnormalities related to cardiac issues, blood clot in your lungs, or pneumonia. Bronchitis is a possibility. Drink plenty of fluids and get plenty of rest. You have been prescribed prednisone, which you will take 40mg  (4 tablets) daily for five days. You have also been prescribed an antibiotic that you are to take for the next four days. You also have signs of an anemia, which means your hemoglobin is low.  This should be followed up with by a PCP within 2 days.  A resource guide is included to help you choose a PCP.   Emergency Department Resource Guide 1) Find a Doctor and Pay Out of Pocket Although you won't have to find out who is covered by your insurance plan, it is a good idea to ask around and get recommendations. You will then need to call the office and see if the doctor you have chosen will accept you as a new patient and what types of options they offer for patients who are self-pay. Some doctors offer discounts or will set up payment plans for their patients who do not have insurance, but you will need to ask so you aren't surprised when you get to your appointment.  2) Contact Your Local Health Department Not all health departments have doctors that can see patients for sick visits, but many do, so it is worth a call to see if yours does. If you don't know where your local health department is, you can check in your phone book. The CDC also has a tool to help you locate your state's health department, and many state websites also have listings of all of their local health departments.  3) Find a [facility] If your illness is not likely to be very severe or complicated, you may want to try a walk in clinic. These are popping up all over the country in pharmacies, drugstores, and shopping centers. They're usually staffed by nurse practitioners or physician assistants that have been  trained to treat common illnesses and complaints. They're usually fairly quick and inexpensive. However, if you have serious medical issues or chronic medical problems, these are probably not your best option.  No Primary Care Doctor: - Call Health Connect at  [phone] - they can help you locate a primary care doctor that  accepts your insurance, provides certain services, etc. - Physician Referral Service- [phone]  Chronic Pain Problems: Organization         Address  Phone   Notes  [facility]  [phone] Patients need to be referred by their primary care doctor.   Medication Assistance: Organization         Address  Phone   Notes  [facility] Medication [facility] [address] [place], [place] [address] [phone] --Must be a resident of [facility] -- Must have NO insurance coverage whatsoever (no Medicaid/ Medicare, etc.) -- The pt. MUST have a primary care doctor that directs their care regularly and follows them in the community   [facility]ist  [phone]   [employer]  [phone]    Agencies that provide inexpensive medical care: Organization         Address  Phone   Notes  [place]  [phone]   [facility]    [phone]   [facility] [address]  [place], [place] [address] [phone]   [place] [address], [place] [phone]   Planned Parenthood    [phone]   [facility]    [phone]   [place] and [place] [address], [place] Phone:  [phone], Fax:  [phone] Hours of Operation:  9 am - 6 pm, M-F.  Also accepts Medicaid/Medicare and self-pay.  [facility] for [place] [address], [address],  Phone: [phone], Fax: [phone]. Hours of Operation:  8:30 am - 5:30 pm, M-F.  Also accepts Medicaid and self-pay.   [facility] [address], [place] Phone: [phone]   [place], [place], [place] [phone], Ext. [phone] Mondays & Thursdays: 7-9 AM.  First 15 patients are seen on a first come, first serve basis.    [place] Providers:  Organization         Address  Phone   Notes  [facility] [place] [address],  [phone] Also accepts self-pay patients.  [facility] [address], [place]  [phone]   [place], [address], [place] [phone]   [facility] [address], [place] [phone]   Lucianne Lei [address], [place]   [phone] Only accepts [place] Access [place] patients after they have their name applied to their card.   Self-Pay (no insurance) in [facility]:  Organization         Address  Phone   Notes  Sickle Cell Patients, [facility] Internal Medicine [place] [phone]   [facility] [place] [phone]   [facility] [place]  [place], [place], [place] [phone]   [facility]/Dr. Osei-Bonsu  [address] [place] or [address], [place] [phone] Phone number for both [place] and [place] locations is the same.  [facility]gent Medical and [facility] [address] [place] [phone]   [facility] [address], [place] or [address] [phone]   [facility] [address] [place] [phone], phone; [phone], fax Sees patients 1st and 3rd Saturday of every month.  Must not qualify for public or private insurance (i.e. Medicaid, Medicare, [place] Health Choice, Veterans' Benefits)  Household income should be no  more than 200% of the poverty level The clinic cannot treat you if you are pregnant or think you are pregnant  Sexually transmitted diseases are not treated at the clinic.    Dental Care: Organization         Address  Phone  Notes  [facility] Department of [facility] [place] [phone] Accepts children up to age 30 who are enrolled in [place] or [place]; pregnant women with a Medicaid card; and children who have applied for Medicaid or  Health Choice, but were declined, whose parents can pay a reduced fee at time of service.  [facility] Department of [facility]  [address], [place] [phone] Accepts children up to age 93 who are enrolled in [place] or [place]  Choice; pregnant women with a Medicaid card; and children who have applied for Medicaid or [place] Health Choice, but were declined, whose parents can pay a reduced fee at time of service.  [place] Adult Dental Access PROGRAM  [place] [phone] Patients are seen by appointment only. Walk-ins are not accepted. [place] will see patients 82 years of age and older. Monday - Tuesday (8am-5pm) Most Wednesdays (8:30-5pm) $30 per visit, cash only  [facility] Adult Dental Access PROGRAM  [address], [facility] [phone] Patients are seen by appointment only. Walk-ins are not accepted. [place] will see patients 38 years of age and older. One Wednesday Evening (Monthly: Volunteer Based).  $30 per visit, cash only  [place]  [phone] for adults; Children under age 36, call Graduate Pediatric Dentistry at [phone]. Children aged 30-14, please call [phone] to request a pediatric application.  Dental services are provided in all areas of dental care including fillings, crowns and bridges, complete and partial dentures, implants, gum treatment, root canals,  and extractions. Preventive care is also provided. Treatment is provided to both adults and children. Patients are selected via a lottery and there is often a waiting list.   [facility] [address], [employer]  [phone] [email or web address]   [facility] [address] [place], [place] [phone], Ext. [phone] Second and Fourth Thursday of each month, opens at 6:30 AM; Clinic ends at 9 AM.  Patients are seen on a first-come first-served basis, and a limited number are seen during each clinic.   [facility]  [address] Hillard Danker [place], [place] [phone]   Eligibility Requirements You must have lived in [place], [place], or [place] counties for at least the last three months.   You cannot be eligible for state or federal sponsored [employer], including [employer], [place], or [employer].   You generally cannot be eligible for healthcare insurance through your employer.    How to apply: Eligibility screenings are held every Tuesday and Wednesday afternoon from 1:00 pm until 4:00 pm. You do not need an appointment for the interview!  [facility] [address] [place], [name]   [place]  [place] Department  [place]  [phone]    Behavioral Health Resources in the Community: Intensive Outpatient Programs Organization         Address  Phone  Notes  [place]. [address] [place], [place] [phone]   [facility] [address] [place], [place]   ADS: Alcohol & Drug S[facility]cs [address] [place], [place]   [place] [address],  [place], [place] or [phone]   Substance Abuse Resources Organization         Address  Phone  Notes  Alcohol and Drug Services  [phone]    [place]  [phone]   [facility]   Chinita Pester  [phone]   Residential & Outpatient Substance Abuse Program  [phone]   Psychological Services Organization         Address  Phone  Notes  [facility] [place]  [place]  [phone]   [place] [address], [place] or [phone]  Mobile Crisis Teams Organization         Address  Phone  Notes  [facility], Mobile Crisis Care Unit  [phone]   [facility]  [address] [place], [place]   [place] [address] [place] [phone]    Self-Help/Support Groups Organization         Address  Phone             Notes  [place]. of [place] - variety of support groups  [place] Call for more information  Narcotics Anonymous (NA), [facility] [address], [employer] [place]  2 meetings at this location   Special educational needs teacher         Address  Phone  Notes  [facility],    [place]  [phone]   [facility]  [address] [place] [address], [place], [place]   Ingenio [place], [place] [phone] Admissions: 8am-3pm M-F  Incentives Substance [place] [address]    [place], [place] [phone]   [facility] [address] [place], [place], [place]   [facility] [address]  [place], [place]   [facility] - Intensive Outpatient [address] Kristeen [address], [place], [place]   [facility].,  [place], [place] [phone] or [phone]   [facility] (RTS) [address] [place], [place] Accepts Medicaid  [facility] [place] [address],    [place] [phone] Substance Abuse/Addiction Treatment   [facility] Organization         Address  Phone  Notes  [facility]  [phone]   Domenic Schwab, PhD [address] Arlis [place], [place]   [phone] or [phone]   [place], [place] [phone]   [facility] [address], [place], [place] [phone] Insurance/Medicaid/sponsorship through [facility] and Families [address] [place]                                    [place], [place] [phone] [place] [address], [place] [phone]    Dr. Adele Schilder  [phone]   [facility] Dept. 1) [address] [place] 2) [place] 3)  [place] [address], [place] [phone]  [phone]   [place] [phone] or [phone] (After Hours)       Acute Bronchitis Bronchitis is when the airways that extend from the windpipe into the lungs get red, puffy, and painful (inflamed). Bronchitis often causes thick spit (mucus) to develop. This leads to a cough. A cough is the most common symptom of bronchitis. In acute bronchitis, the condition usually begins suddenly and goes away over time (usually in 2 weeks). Smoking, allergies, and asthma can make bronchitis worse. Repeated episodes of bronchitis may cause more lung problems. HOME CARE  Rest.  Drink enough fluids to keep your pee (urine) clear or pale yellow (unless you need to limit fluids as told by your doctor).  Only take over-the-counter or prescription medicines as  told by your doctor.  Avoid smoking and secondhand smoke. These can make bronchitis worse. If you are a smoker, think about using nicotine gum or skin patches. Quitting smoking will help your lungs heal faster.  Reduce  the chance of getting bronchitis again by:  Washing your hands often.  Avoiding people with cold symptoms.  Trying not to touch your hands to your mouth, nose, or eyes.  Follow up with your doctor as told. GET HELP IF: Your symptoms do not improve after 1 week of treatment. Symptoms include:  Cough.  Fever.  Coughing up thick spit.  Body aches.  Chest congestion.  Chills.  Shortness of breath.  Sore throat. GET HELP RIGHT AWAY IF:   You have an increased fever.  You have chills.  You have severe shortness of breath.  You have bloody thick spit (sputum).  You throw up (vomit) often.  You lose too much body fluid (dehydration).  You have a severe headache.  You faint. MAKE SURE YOU:   Understand these instructions.  Will watch your condition.  Will get help right away if you are not doing well or get worse.   This information is not intended to replace advice given to you by your health care provider. Make sure you discuss any questions you have with your health care provider.   Document Released: 08/17/2007 Document Revised: 10/31/2012 Document Reviewed: 08/21/2012 Elsevier Interactive Patient Education 2016 [employer].  Anemia, Nonspecific Anemia is a condition in which the concentration of red blood cells or hemoglobin in the blood is below normal. Hemoglobin is a substance in red blood cells that carries oxygen to the tissues of the body. Anemia results in not enough oxygen reaching these tissues.  CAUSES  Common causes of anemia include:   Excessive bleeding. Bleeding may be internal or external. This includes excessive bleeding from periods (in women) or from the intestine.   Poor nutrition.   Chronic kidney, thyroid, and liver disease.  Bone marrow disorders that decrease red blood cell production.  Cancer and treatments for cancer.  HIV, AIDS, and their treatments.  Spleen problems that increase red blood cell destruction.  Blood  disorders.  Excess destruction of red blood cells due to infection, medicines, and autoimmune disorders. SIGNS AND SYMPTOMS   Minor weakness.   Dizziness.   Headache.  Palpitations.   Shortness of breath, especially with exercise.   Paleness.  Cold sensitivity.  Indigestion.  Nausea.  Difficulty sleeping.  Difficulty concentrating. Symptoms may occur suddenly or they may develop slowly.  DIAGNOSIS  Additional blood tests are often needed. These help your health care provider determine the best treatment. Your health care provider will check your stool for blood and look for other causes of blood loss.  TREATMENT  Treatment varies depending on the cause of the anemia. Treatment can include:   Supplements of iron, vitamin C16, or folic acid.   Hormone medicines.   A blood transfusion. This may be needed if blood loss is severe.   Hospitalization. This may be needed if there is significant continual blood loss.   Dietary changes.  Spleen removal. HOME CARE INSTRUCTIONS Keep all follow-up appointments. It often takes many weeks to correct anemia, and having your health care provider check on your condition and your response to treatment is very important. SEEK IMMEDIATE MEDICAL CARE IF:   You develop extreme weakness, shortness of breath, or chest pain.   You become dizzy or have trouble concentrating.  You develop heavy vaginal  bleeding.   You develop a rash.   You have bloody or black, tarry stools.   You faint.   You vomit up blood.   You vomit repeatedly.   You have abdominal pain.  You have a fever or persistent symptoms for more than 2-3 days.   You have a fever and your symptoms suddenly get worse.   You are dehydrated.  MAKE SURE YOU:  Understand these instructions.  Will watch your condition.  Will get help right away if you are not doing well or get worse.   This information is not intended to replace advice given  to you by your health care provider. Make sure you discuss any questions you have with your health care provider.   Document Released: 04/07/2004 Document Revised: 10/31/2012 Document Reviewed: 08/24/2012 Elsevier Interactive Patient Education [employer].

## 2014-12-20 NOTE — ED Notes (Signed)
Pt now has another IV in place, and CT scan notified.

## 2014-12-20 NOTE — Progress Notes (Signed)
Pt to CT for CTA of Chest, when injected IV with saline it immediately blew.  IV was inserted under [place] and in order to keep CT room clear, pt was sent back to ED.  Situation was explained to Ms Parisi and she said she would be more comfortable going back to her room to urinate and get some cough medicine.  Lennette Bihari, RN to call when new IV access is gained.

## 2014-12-20 NOTE — ED Notes (Signed)
Pt. reports intermittent central chest pain for 1 week week with persistent productive cough and chest congestion  , nausea and vomitting , denies diaphoresis . No fever or chills.

## 2014-12-20 NOTE — ED Provider Notes (Signed)
The patient is 39 years old, she tells me that she has a history of reactive airway disease in the form of asthma and bronchitis are equally, she does use albuterol metered-dose inhaler for shortness of breath and has been using this at home with only minimal improvement. She has had chest pain and shortness of breath this week associated with frequent coughing, every time she coughs her chest hurts. She has no fevers chills and has no nausea but has had some posttussive emesis when she has phlegm filled coughing fits. She denies swelling of her legs, cancer.  She has however recently started birth control pills because of excessive vaginal bleeding, in fact she states that last month she bled every day of the month. There is no fevers or chills. On exam the patient has a soft abdomen, no peripheral edema or asymmetry, no coughing, clear lung sounds, borderline tachycardia on auscultation without murmurs. She does not appear to be in distress.  Initial testing is reassuring however the patient does have some risk for pulmonary embolism including heart rate over 100 and oral contraception pills not to mention the fact that her lung sounds are normal. We'll obtain CT scan of the chest to rule out pulmonary embolism. Otherwise the patient can be discharged home with treatment for bronchitis.   Medical screening examination/treatment/procedure(s) were conducted as a shared visit with non-physician practitioner(s) and myself.  I personally evaluated the patient during the encounter.  Clinical Impression:   Final diagnoses:  Cough  Microcytic anemia  Chest wall pain  Bronchitis with asthma, acute    EKG Interpretation  Date/Time:  Saturday December 20 2014 20:21:12 EDT Ventricular Rate:  106 PR Interval:  112 QRS Duration: 82 QT Interval:  334 QTC Calculation: 443 R Axis:   74 Text Interpretation:  Sinus tachycardia Otherwise normal ECG since last tracing no significant change Confirmed by Sabra Heck   MD, Karaline Buresh ([id number]) on 12/20/2014 8:47:25 PM           Noemi Chapel, MD 12/21/14 0021

## 2014-12-20 NOTE — ED Notes (Signed)
CT called and IV blew during CTA, pt back in ED for insertion of new IV.

## 2014-12-21 LAB — I-STAT BETA HCG BLOOD, ED (MC, WL, AP ONLY)

## 2014-12-21 MED ORDER — SULFAMETHOXAZOLE-TRIMETHOPRIM 800-160 MG PO TABS: 1.0000 | ORAL_TABLET | Freq: Two times a day (BID) | ORAL | Status: AC

## 2014-12-21 MED ORDER — BENZONATATE 100 MG PO CAPS: 100.0000 mg | ORAL_CAPSULE | Freq: Three times a day (TID) | ORAL | Status: DC | PRN

## 2014-12-21 NOTE — ED Notes (Signed)
Pt verbalized understanding of d/c instructions and has no further questions. Pt stable and NAD.  

## 2014-12-22 MED ORDER — IOHEXOL 350 MG/ML SOLN
100.0000 mL | Freq: Once | INTRAVENOUS | Status: AC | PRN
  Administered 2014-12-20: 100 mL via INTRAVENOUS

## 2015-03-31 ENCOUNTER — Emergency Department (HOSPITAL_COMMUNITY)
Admission: EM | Admit: 2015-03-31 | Discharge: 2015-03-31 | Disposition: A | Discharge status: Home / Self Care | Payer: Self-pay | Attending: Emergency Medicine | Admitting: Emergency Medicine

## 2015-03-31 ENCOUNTER — Encounter (HOSPITAL_COMMUNITY): Payer: Self-pay

## 2015-03-31 DIAGNOSIS — G43909 Migraine, unspecified, not intractable, without status migrainosus: Secondary | ICD-10-CM | POA: Insufficient documentation

## 2015-03-31 DIAGNOSIS — Y9289 Other specified places as the place of occurrence of the external cause: Secondary | ICD-10-CM | POA: Insufficient documentation

## 2015-03-31 DIAGNOSIS — M545 Low back pain, unspecified: Secondary | ICD-10-CM

## 2015-03-31 DIAGNOSIS — Y9389 Activity, other specified: Secondary | ICD-10-CM | POA: Insufficient documentation

## 2015-03-31 DIAGNOSIS — F419 Anxiety disorder, unspecified: Secondary | ICD-10-CM | POA: Insufficient documentation

## 2015-03-31 DIAGNOSIS — S3992XA Unspecified injury of lower back, initial encounter: Secondary | ICD-10-CM | POA: Insufficient documentation

## 2015-03-31 DIAGNOSIS — F329 Major depressive disorder, single episode, unspecified: Secondary | ICD-10-CM | POA: Insufficient documentation

## 2015-03-31 DIAGNOSIS — Z8739 Personal history of other diseases of the musculoskeletal system and connective tissue: Secondary | ICD-10-CM | POA: Insufficient documentation

## 2015-03-31 DIAGNOSIS — S29001A Unspecified injury of muscle and tendon of front wall of thorax, initial encounter: Secondary | ICD-10-CM | POA: Insufficient documentation

## 2015-03-31 DIAGNOSIS — Z7952 Long term (current) use of systemic steroids: Secondary | ICD-10-CM | POA: Insufficient documentation

## 2015-03-31 DIAGNOSIS — X500XXA Overexertion from strenuous movement or load, initial encounter: Secondary | ICD-10-CM | POA: Insufficient documentation

## 2015-03-31 DIAGNOSIS — F17210 Nicotine dependence, cigarettes, uncomplicated: Secondary | ICD-10-CM | POA: Insufficient documentation

## 2015-03-31 DIAGNOSIS — Z79899 Other long term (current) drug therapy: Secondary | ICD-10-CM | POA: Insufficient documentation

## 2015-03-31 DIAGNOSIS — Z8719 Personal history of other diseases of the digestive system: Secondary | ICD-10-CM | POA: Insufficient documentation

## 2015-03-31 DIAGNOSIS — Y990 Civilian activity done for income or pay: Secondary | ICD-10-CM | POA: Insufficient documentation

## 2015-03-31 MED ORDER — NAPROXEN 250 MG PO TABS: 250.0000 mg | ORAL_TABLET | Freq: Two times a day (BID) | ORAL | Status: DC

## 2015-03-31 MED ORDER — METHOCARBAMOL 500 MG PO TABS: 500.0000 mg | ORAL_TABLET | Freq: Two times a day (BID) | ORAL | Status: DC | PRN

## 2015-03-31 MED ORDER — ACETAMINOPHEN 325 MG PO TABS
650.0000 mg | ORAL_TABLET | Freq: Once | ORAL | Status: AC
Start: 2015-03-31 — End: 2015-03-31
  Administered 2015-03-31: 650 mg via ORAL
  Filled 2015-03-31: qty 2

## 2015-03-31 NOTE — Discharge Instructions (Signed)
Back Exercises The following exercises strengthen the muscles that help to support the back. They also help to keep the lower back flexible. Doing these exercises can help to prevent back pain or lessen existing pain. If you have back pain or discomfort, try doing these exercises 2-3 times each day or as told by your health care provider. When the pain goes away, do them once each day, but increase the number of times that you repeat the steps for each exercise (do more repetitions). If you do not have back pain or discomfort, do these exercises once each day or as told by your health care provider. EXERCISES Single Knee to Chest Repeat these steps 3-5 times for each leg:  Lie on your back on a firm bed or the floor with your legs extended.  Bring one knee to your chest. Your other leg should stay extended and in contact with the floor.  Hold your knee in place by grabbing your knee or thigh.  Pull on your knee until you feel a gentle stretch in your lower back.  Hold the stretch for 10-30 seconds.  Slowly release and straighten your leg. Pelvic Tilt Repeat these steps 5-10 times:  Lie on your back on a firm bed or the floor with your legs extended.  Bend your knees so they are pointing toward the ceiling and your feet are flat on the floor.  Tighten your lower abdominal muscles to press your lower back against the floor. This motion will tilt your pelvis so your tailbone points up toward the ceiling instead of pointing to your feet or the floor.  With gentle tension and even breathing, hold this position for 5-10 seconds. Cat-Cow Repeat these steps until your lower back becomes more flexible:  Get into a hands-and-knees position on a firm surface. Keep your hands under your shoulders, and keep your knees under your hips. You may place padding under your knees for comfort.  Let your head hang down, and point your tailbone toward the floor so your lower back becomes rounded like the  back of a cat.  Hold this position for 5 seconds.  Slowly lift your head and point your tailbone up toward the ceiling so your back forms a sagging arch like the back of a cow.  Hold this position for 5 seconds. Press-Ups Repeat these steps 5-10 times:  Lie on your abdomen (face-down) on the floor.  Place your palms near your head, about shoulder-width apart.  While you keep your back as relaxed as possible and keep your hips on the floor, slowly straighten your arms to raise the top half of your body and lift your shoulders. Do not use your back muscles to raise your upper torso. You may adjust the placement of your hands to make yourself more comfortable.  Hold this position for 5 seconds while you keep your back relaxed.  Slowly return to lying flat on the floor. Bridges Repeat these steps 10 times:  Lie on your back on a firm surface.  Bend your knees so they are pointing toward the ceiling and your feet are flat on the floor.  Tighten your buttocks muscles and lift your buttocks off of the floor until your waist is at almost the same height as your knees. You should feel the muscles working in your buttocks and the back of your thighs. If you do not feel these muscles, slide your feet 1-2 inches farther away from your buttocks.  Hold this position for 3-5  seconds.  Slowly lower your hips to the starting position, and allow your buttocks muscles to relax completely. If this exercise is too easy, try doing it with your arms crossed over your chest. Abdominal Crunches Repeat these steps 5-10 times:  Lie on your back on a firm bed or the floor with your legs extended.  Bend your knees so they are pointing toward the ceiling and your feet are flat on the floor.  Cross your arms over your chest.  Tip your chin slightly toward your chest without bending your neck.  Tighten your abdominal muscles and slowly raise your trunk (torso) high enough to lift your shoulder blades a  tiny bit off of the floor. Avoid raising your torso higher than that, because it can put too much stress on your low back and it does not help to strengthen your abdominal muscles.  Slowly return to your starting position. Back Lifts Repeat these steps 5-10 times: 1. Lie on your abdomen (face-down) with your arms at your sides, and rest your forehead on the floor. 2. Tighten the muscles in your legs and your buttocks. 3. Slowly lift your chest off of the floor while you keep your hips pressed to the floor. Keep the back of your head in line with the curve in your back. Your eyes should be looking at the floor. 4. Hold this position for 3-5 seconds. 5. Slowly return to your starting position. SEEK MEDICAL CARE IF:  Your back pain or discomfort gets much worse when you do an exercise.  Your back pain or discomfort does not lessen within 2 hours after you exercise. If you have any of these problems, stop doing these exercises right away. Do not do them again unless your health care provider says that you can. SEEK IMMEDIATE MEDICAL CARE IF:  You develop sudden, severe back pain. If this happens, stop doing the exercises right away. Do not do them again unless your health care provider says that you can.   This information is not intended to replace advice given to you by your health care provider. Make sure you discuss any questions you have with your health care provider.   Document Released: 04/07/2004 Document Revised: 11/19/2014 Document Reviewed: 04/24/2014 Elsevier Interactive Patient Education 2016 [employer]  Back Pain, Adult Back pain is very common in adults.The cause of back pain is rarely dangerous and the pain often gets better over time.The cause of your back pain may not be known. Some common causes of back pain include:  Strain of the muscles or ligaments supporting the spine.  Wear and tear (degeneration) of the spinal disks.  Arthritis.  Direct injury to the  back. For many people, back pain may return. Since back pain is rarely dangerous, most people can learn to manage this condition on their own. HOME CARE INSTRUCTIONS Watch your back pain for any changes. The following actions may help to lessen any discomfort you are feeling:  Remain active. It is stressful on your back to sit or stand in one place for long periods of time. Do not sit, drive, or stand in one place for more than 30 minutes at a time. Take short walks on even surfaces as soon as you are able.Try to increase the length of time you walk each day.  Exercise regularly as directed by your health care provider. Exercise helps your back heal faster. It also helps avoid future injury by keeping your muscles strong and flexible.  Do not stay in  bed.Resting more than 1-2 days can delay your recovery.  Pay attention to your body when you bend and lift. The most comfortable positions are those that put less stress on your recovering back. Always use proper lifting techniques, including:  Bending your knees.  Keeping the load close to your body.  Avoiding twisting.  Find a comfortable position to sleep. Use a firm mattress and lie on your side with your knees slightly bent. If you lie on your back, put a pillow under your knees.  Avoid feeling anxious or stressed.Stress increases muscle tension and can worsen back pain.It is important to recognize when you are anxious or stressed and learn ways to manage it, such as with exercise.  Take medicines only as directed by your health care provider. Over-the-counter medicines to reduce pain and inflammation are often the most helpful.Your health care provider may prescribe muscle relaxant drugs.These medicines help dull your pain so you can more quickly return to your normal activities and healthy exercise.  Apply ice to the injured area:  Put ice in a plastic bag.  Place a towel between your skin and the bag.  Leave the ice on for 20  minutes, 2-3 times a day for the first 2-3 days. After that, ice and heat may be alternated to reduce pain and spasms.  Maintain a healthy weight. Excess weight puts extra stress on your back and makes it difficult to maintain good posture. SEEK MEDICAL CARE IF:  You have pain that is not relieved with rest or medicine.  You have increasing pain going down into the legs or buttocks.  You have pain that does not improve in one week.  You have night pain.  You lose weight.  You have a fever or chills. SEEK IMMEDIATE MEDICAL CARE IF:   You develop new bowel or bladder control problems.  You have unusual weakness or numbness in your arms or legs.  You develop nausea or vomiting.  You develop abdominal pain.  You feel faint.   This information is not intended to replace advice given to you by your health care provider. Make sure you discuss any questions you have with your health care provider.   Document Released: 02/28/2005 Document Revised: 03/21/2014 Document Reviewed: 07/02/2013 Elsevier Interactive Patient Education 2016 [place] Injury Prevention Back injuries can be very painful. They can also be difficult to heal. After having one back injury, you are more likely to injure your back again. It is important to learn how to avoid injuring or re-injuring your back. The following tips can help you to prevent a back injury. WHAT SHOULD I KNOW ABOUT PHYSICAL FITNESS?  Exercise for 30 minutes per day on most days of the week or as directed by your health care provider. Make sure to:  Do aerobic exercises, such as walking, jogging, biking, or swimming.  Do exercises that increase balance and strength, such as tai chi and yoga. These can decrease your risk of falling and injuring your back.  Do stretching exercises to help with flexibility.  Try to develop strong abdominal muscles. Your abdominal muscles provide a lot of the support that is needed by your  back.  Maintain a healthy weight. This helps to decrease your risk of a back injury. WHAT SHOULD I KNOW ABOUT MY DIET?  Talk with your health care provider about your overall diet. Take supplements and vitamins only as directed by your health care provider.  Talk with your health care provider about how  much calcium and vitamin D you need each day. These nutrients help to prevent weakening of the bones (osteoporosis). Osteoporosis can cause broken (fractured) bones, which lead to back pain. °· Include good sources of calcium in your diet, such as dairy products, green leafy vegetables, and products that have had calcium added to them (fortified). °· Include good sources of vitamin D in your diet, such as milk and foods that are fortified with vitamin D. °WHAT SHOULD I KNOW ABOUT MY POSTURE? °· Sit up straight and stand up straight. Avoid leaning forward when you sit or hunching over when you stand. °· Choose chairs that have good low-back (lumbar) support. °· If you work at a desk, sit close to it so you do not need to lean over. Keep your chin tucked in. Keep your neck drawn back, and keep your elbows bent at a right angle. Your arms should look like the letter "L." °· Sit high and close to the steering wheel when you drive. Add a lumbar support to your car seat, if needed. °· Avoid sitting or standing in one position for very long. Take breaks to get up, stretch, and walk around at least one time every hour. Take breaks every hour if you are driving for long periods of time. °· Sleep on your side with your knees slightly bent, or sleep on your back with a pillow under your knees. Do not lie on the front of your body to sleep. °WHAT SHOULD I KNOW ABOUT LIFTING, TWISTING, AND REACHING? °Lifting and Heavy Lifting °· Avoid heavy lifting, especially repetitive heavy lifting. If you must do heavy lifting: °¨ Stretch before lifting. °¨ Work slowly. °¨ Rest between lifts. °¨ Use a tool such as a cart or a dolly to  move objects if one is available. °¨ Make several small trips instead of carrying one heavy load. °¨ Ask for help when you need it, especially when moving big objects. °· Follow these steps when lifting: °¨ Stand with your feet shoulder-width apart. °¨ Get as close to the object as you can. Do not try to pick up a heavy object that is far from your body. °¨ Use handles or lifting straps if they are available. °¨ Bend at your knees. Squat down, but keep your heels off the floor. °¨ Keep your shoulders pulled back, your chin tucked in, and your back straight. °¨ Lift the object slowly while you tighten the muscles in your legs, abdomen, and buttocks. Keep the object as close to the center of your body as possible. °· Follow these steps when putting down a heavy load: °¨ Stand with your feet shoulder-width apart. °¨ Lower the object slowly while you tighten the muscles in your legs, abdomen, and buttocks. Keep the object as close to the center of your body as possible. °¨ Keep your shoulders pulled back, your chin tucked in, and your back straight. °¨ Bend at your knees. Squat down, but keep your heels off the floor. °¨ Use handles or lifting straps if they are available. °Twisting and Reaching °· Avoid lifting heavy objects above your waist. °· Do not twist at your waist while you are lifting or carrying a load. If you need to turn, move your feet. °· Do not bend over without bending at your knees. °· Avoid reaching over your head, across a table, or for an object on a high surface. °WHAT ARE SOME OTHER TIPS? °· Avoid wet floors and icy ground. Keep sidewalks clear of ice   to prevent falls. °· Do not sleep on a mattress that is too soft or too hard. °· Keep items that are used frequently within easy reach. °· Put heavier objects on shelves at waist level, and put lighter objects on lower or higher shelves. °· Find ways to decrease your stress, such as exercise, massage, or relaxation techniques. Stress can build up in  your muscles. Tense muscles are more vulnerable to injury. °· Talk with your health care provider if you feel anxious or depressed. These conditions can make back pain worse. °· Wear flat heel shoes with cushioned soles. °· Avoid sudden movements. °· Use both shoulder straps when carrying a backpack. °· Do not use any tobacco products, including cigarettes, chewing tobacco, or electronic cigarettes. If you need help quitting, ask your health care provider. °  °This information is not intended to replace advice given to you by your health care provider. Make sure you discuss any questions you have with your health care provider. °  °Document Released: 04/07/2004 Document Revised: 07/15/2014 Document Reviewed: 03/04/2014 °Elsevier Interactive Patient Education ©2016 Elsevier Inc. ° °

## 2015-03-31 NOTE — ED Provider Notes (Signed)
CSN: [id number]     Arrival date & time 03/31/15  1311 History  By signing my name below, I, Gina Shelton, attest that this documentation has been prepared under the direction and in the presence of non-physician practitioner, Waynetta Pean, PA-C. Electronically Signed: Evelene Shelton, Scribe. 03/31/2015. 2:47 PM.    Chief Complaint  Patient presents with  . Back Pain    The history is provided by the patient. No language interpreter was used.     HPI Comments:  Gina Shelton is a 40 y.o. female who presents to the Emergency Department complaining of 10/10 right lower back pain with associated chest wall pain since yesterday AM after she lifted a pt at work. Pain is worse with movement. She has taken ibuprofen with mild relief; last dose was this AM. She is a smoker and has COPD. She reports SOB with extreme pain.  She denies bowel/bladder incontinence, difficulty urinating, dysuria, hematuria, frequent/urgent urination, vaginal discharge, numbness/weakness to the extremities,  abdominal pain, fever, palpitations, leg pain, leg swelling, nausea, and vomiting. Pt also denies h/o CA, and h/o  IVDA . S LNMP began on 03/24/15 and ended ~ 2 days ago.   Past Medical History  Diagnosis Date  . Depression   . GERD (gastroesophageal reflux disease)   . Migraine   . Gout     ble  . Depression   . Migraine   . Anxiety    Past Surgical History  Procedure Laterality Date  . Cesarean section  infection at incission requiring return to OR x 2  . Tubal ligation     Family History  Problem Relation Age of Onset  . Anesthesia problems Neg Hx   . Hypotension Neg Hx   . Malignant hyperthermia Neg Hx   . Pseudochol deficiency Neg Hx   . Hypertension Mother   . Arthritis Sister    Social History  Substance Use Topics  . Smoking status: Current Every Day Smoker -- 0.00 packs/day for 0 years    Types: Cigarettes  . Smokeless tobacco: Never Used     Comment: Currently using Nicotine patch  .  Alcohol Use: 0.0 oz/week   OB History    Gravida Para Term Preterm AB TAB SAB Ectopic Multiple Living   3 3        3      Review of Systems  Constitutional: Negative for fever and chills.  Respiratory: Negative for cough and shortness of breath.   Cardiovascular: Positive for chest pain. Negative for palpitations and leg swelling.  Gastrointestinal: Negative for nausea, vomiting and abdominal pain.  Genitourinary: Negative for dysuria, urgency, frequency, hematuria, vaginal discharge and difficulty urinating.  Musculoskeletal: Positive for back pain. Negative for neck pain and neck stiffness.  Skin: Negative for rash.  Neurological: Negative for dizziness, syncope, weakness and numbness.   Allergies  Review of patient's allergies indicates no known allergies.  Home Medications   Prior to Admission medications   Medication Sig Start Date End Date Taking? Authorizing Provider  Acetaminophen-Caff-Pyrilamine (MIDOL COMPLETE PO) Take 2 tablets by mouth 2 (two) times daily as needed (menstrual cramps).     Historical Provider, MD  albuterol (PROVENTIL HFA;VENTOLIN HFA) 108 (90 BASE) MCG/ACT inhaler Inhale 1-2 puffs into the lungs every 4 (four) hours as needed for wheezing or shortness of breath (cough). 11/21/13   Dahlia Bailiff, PA-C  albuterol (PROVENTIL HFA;VENTOLIN HFA) 108 (90 BASE) MCG/ACT inhaler Inhale 2 puffs into the lungs every 4 (four) hours as needed for wheezing  or shortness of breath. 12/20/14   Shawn C Joy, PA-C  benzonatate (TESSALON) 100 MG capsule Take 1 capsule (100 mg total) by mouth every 8 (eight) hours as needed for cough. 12/21/14   Shawn C Joy, PA-C  carbamazepine (TEGRETOL XR) 200 MG 12 hr tablet Take 1 tablet (200 mg total) by mouth 2 (two) times daily. Mood stabilization. 06/08/13   Patrecia Pour, NP  citalopram (CELEXA) 20 MG tablet Take 20 mg by mouth daily.    Historical Provider, MD  gabapentin (NEURONTIN) 100 MG capsule Take 1 capsule (100 mg total) by mouth 3  (three) times daily. Anxiety, neuropathic pain. 06/08/13   Patrecia Pour, NP  hydrOXYzine (ATARAX/VISTARIL) 10 MG tablet Take 1 tablet (10 mg total) by mouth every 6 (six) hours as needed for itching. Patient taking differently: Take 10 mg by mouth daily after lunch.  09/21/14   Montine Circle, PA-C  medroxyPROGESTERone (PROVERA) 5 MG tablet Take 2 tablets (10 mg total) by mouth daily. 11/07/14   Forde Dandy, MD  methocarbamol (ROBAXIN) 500 MG tablet Take 1 tablet (500 mg total) by mouth 2 (two) times daily as needed for muscle spasms. 03/31/15   Waynetta Pean, PA-C  naproxen (NAPROSYN) 250 MG tablet Take 1 tablet (250 mg total) by mouth 2 (two) times daily with a meal. 03/31/15   Waynetta Pean, PA-C  oxyCODONE (OXY IR/ROXICODONE) 5 MG immediate release tablet Take 1 tablet (5 mg total) by mouth once. Patient taking differently: Take 5 mg by mouth once. Daily 09/28/14   Tori Milks, MD  oxyCODONE-acetaminophen (PERCOCET/ROXICET) 5-325 MG per tablet Take 1-2 tablets by mouth every 6 (six) hours as needed for severe pain. 05/29/14   Luvenia Redden, PA-C  predniSONE (DELTASONE) 10 MG tablet Take 4 tablets (40 mg total) by mouth daily. 12/20/14   Shawn C Joy, PA-C  risperiDONE (RISPERDAL) 1 MG tablet Take 1 tablet (1 mg total) by mouth at bedtime. 06/08/13   Patrecia Pour, NP  traZODone (DESYREL) 100 MG tablet Take 1 tablet (100 mg total) by mouth at bedtime as needed for sleep. Patient taking differently: Take 100 mg by mouth at bedtime.  06/08/13   Patrecia Pour, NP   BP 143/97 mmHg  Pulse 87  Temp(Src) 98.3 F (36.8 C) (Oral)  Resp 18  SpO2 99% Physical Exam  Constitutional: She appears well-developed and well-nourished. No distress.  Nontoxic appearing.  HENT:  Head: Normocephalic and atraumatic.  Right Ear: External ear normal.  Left Ear: External ear normal.  Eyes: Conjunctivae are normal. Pupils are equal, round, and reactive to light. Right eye exhibits no discharge. Left eye exhibits no  discharge.  Neck: Normal range of motion. Neck supple. No JVD present. No tracheal deviation present.  Cardiovascular: Normal rate, regular rhythm, normal heart sounds and intact distal pulses.   Pulmonary/Chest: Effort normal and breath sounds normal. No respiratory distress. She has no wheezes. She has no rales. She exhibits tenderness.  Lungs clear to auscultation bilaterally. Anterior chest wall is tender to palpation and reproduces her chest wall pain.   Abdominal: Soft. Bowel sounds are normal. She exhibits no distension. There is no tenderness. There is no guarding.  Musculoskeletal: Normal range of motion. She exhibits tenderness. She exhibits no edema.  TTP over right lower back.  No midline spine tenderness  No back erythema, edema, or ecchymosis  No LE edema or tenderness nml gait 5 out of 5 strength in her bilateral lower extremities.  Lymphadenopathy:  She has no cervical adenopathy.  Neurological: She is alert. She has normal reflexes. She displays normal reflexes. Coordination normal.  Sensation is intact her bilateral lower extremities. Bilateral patellar DTRs are intact. Normal gait.  Skin: Skin is warm and dry. No rash noted. She is not diaphoretic. No erythema. No pallor.  Psychiatric: She has a normal mood and affect. Her behavior is normal.  Nursing note and vitals reviewed.   ED Course  Procedures   DIAGNOSTIC STUDIES:  Oxygen Saturation is 99% on RA, normal by my interpretation.    COORDINATION OF CARE:  2:42 PM Discussed treatment plan with pt at bedside and pt agreed to plan.  Labs Review Labs Reviewed - No data to display  Imaging Review No results found.     MDM   Meds given in ED:  Medications - No data to display  New Prescriptions   METHOCARBAMOL (ROBAXIN) 500 MG TABLET    Take 1 tablet (500 mg total) by mouth 2 (two) times daily as needed for muscle spasms.   NAPROXEN (NAPROSYN) 250 MG TABLET    Take 1 tablet (250 mg total) by mouth 2  (two) times daily with a meal.    Final diagnoses:  Right-sided low back pain without sciatica   This  is a 40 y.o. female who presents to the Emergency Department complaining of 10/10 right lower back pain with associated chest wall pain since yesterday AM after she lifted a pt at work. Pain is worse with movement. She has taken ibuprofen with mild relief; last dose was this AM. She is a smoker and has COPD. She reports SOB with extreme pain. Patient with right back pain.  No neurological deficits and normal neuro exam.  Patient can walk with normal gait.  No loss of bowel or bladder control.  No concern for cauda equina.  No fever, night sweats, weight loss, h/o cancer, IVDU.  RICE protocol and pain medicine indicated and discussed with patient. Patient has chest wall pain which is reproducible on palpation of her chest. I doubt ACS. Vital signs are reassuring. I suspect this is related to muscle spasm. Will provide with protrusions were naproxen and Robaxin and provided back exercises. I discussed strict return precautions. I advised the patient to follow-up with their primary care provider this week. I advised the patient to return to the emergency department with new or worsening symptoms or new concerns. The patient verbalized understanding and agreement with plan.    This patient was discussed with Dr. Eulis Foster who agrees with assessment and plan.  I personally performed the services described in this documentation, which was scribed in my presence. The recorded information has been reviewed and is accurate.        Waynetta Pean, PA-C 03/31/15 1501  Daleen Bo, MD 03/31/15 [id number]

## 2015-03-31 NOTE — ED Notes (Signed)
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

## 2015-04-01 ENCOUNTER — Emergency Department (HOSPITAL_COMMUNITY): Payer: Self-pay

## 2015-04-01 ENCOUNTER — Encounter (HOSPITAL_COMMUNITY): Payer: Self-pay | Admitting: *Deleted

## 2015-04-01 ENCOUNTER — Emergency Department (HOSPITAL_COMMUNITY)
Admission: EM | Admit: 2015-04-01 | Discharge: 2015-04-01 | Disposition: A | Discharge status: Home / Self Care | Payer: Self-pay | Attending: Emergency Medicine | Admitting: Emergency Medicine

## 2015-04-01 DIAGNOSIS — G43909 Migraine, unspecified, not intractable, without status migrainosus: Secondary | ICD-10-CM | POA: Insufficient documentation

## 2015-04-01 DIAGNOSIS — R079 Chest pain, unspecified: Secondary | ICD-10-CM | POA: Insufficient documentation

## 2015-04-01 DIAGNOSIS — Z7952 Long term (current) use of systemic steroids: Secondary | ICD-10-CM | POA: Insufficient documentation

## 2015-04-01 DIAGNOSIS — R1011 Right upper quadrant pain: Secondary | ICD-10-CM

## 2015-04-01 DIAGNOSIS — Z79899 Other long term (current) drug therapy: Secondary | ICD-10-CM | POA: Insufficient documentation

## 2015-04-01 DIAGNOSIS — R109 Unspecified abdominal pain: Secondary | ICD-10-CM

## 2015-04-01 DIAGNOSIS — Z793 Long term (current) use of hormonal contraceptives: Secondary | ICD-10-CM | POA: Insufficient documentation

## 2015-04-01 DIAGNOSIS — F329 Major depressive disorder, single episode, unspecified: Secondary | ICD-10-CM | POA: Insufficient documentation

## 2015-04-01 DIAGNOSIS — M549 Dorsalgia, unspecified: Secondary | ICD-10-CM | POA: Insufficient documentation

## 2015-04-01 DIAGNOSIS — K769 Liver disease, unspecified: Secondary | ICD-10-CM

## 2015-04-01 DIAGNOSIS — Z3202 Encounter for pregnancy test, result negative: Secondary | ICD-10-CM | POA: Insufficient documentation

## 2015-04-01 DIAGNOSIS — Z791 Long term (current) use of non-steroidal anti-inflammatories (NSAID): Secondary | ICD-10-CM | POA: Insufficient documentation

## 2015-04-01 DIAGNOSIS — F419 Anxiety disorder, unspecified: Secondary | ICD-10-CM | POA: Insufficient documentation

## 2015-04-01 DIAGNOSIS — F17210 Nicotine dependence, cigarettes, uncomplicated: Secondary | ICD-10-CM | POA: Insufficient documentation

## 2015-04-01 DIAGNOSIS — Z9851 Tubal ligation status: Secondary | ICD-10-CM | POA: Insufficient documentation

## 2015-04-01 DIAGNOSIS — R10A0 Flank pain, unspecified side: Secondary | ICD-10-CM

## 2015-04-01 LAB — URINALYSIS, ROUTINE W REFLEX MICROSCOPIC
BILIRUBIN URINE: NEGATIVE
Glucose, UA: NEGATIVE mg/dL
Hgb urine dipstick: NEGATIVE
Ketones, ur: NEGATIVE mg/dL
Leukocytes, UA: NEGATIVE
NITRITE: NEGATIVE
Protein, ur: NEGATIVE mg/dL
SPECIFIC GRAVITY, URINE: 1.025 (ref 1.005–1.030)
pH: 6 (ref 5.0–8.0)

## 2015-04-01 LAB — BASIC METABOLIC PANEL
Anion gap: 9 (ref 5–15)
BUN: 10 mg/dL (ref 6–20)
CALCIUM: 8.9 mg/dL (ref 8.9–10.3)
CHLORIDE: 105 mmol/L (ref 101–111)
CO2: 24 mmol/L (ref 22–32)
CREATININE: 0.53 mg/dL (ref 0.44–1.00)
Glucose, Bld: 88 mg/dL (ref 65–99)
Potassium: 4.2 mmol/L (ref 3.5–5.1)
SODIUM: 138 mmol/L (ref 135–145)

## 2015-04-01 LAB — LIPASE, BLOOD: Lipase: 33 U/L (ref 11–51)

## 2015-04-01 LAB — HEPATIC FUNCTION PANEL
ALT: 14 U/L (ref 14–54)
AST: 18 U/L (ref 15–41)
Albumin: 3.2 g/dL — ABNORMAL LOW (ref 3.5–5.0)
Alkaline Phosphatase: 111 U/L (ref 38–126)
Bilirubin, Direct: <0.1 mg/dL — ABNORMAL LOW (ref 0.1–0.5)
TOTAL PROTEIN: 6.5 g/dL (ref 6.5–8.1)
Total Bilirubin: <0.1 mg/dL — ABNORMAL LOW (ref 0.3–1.2)

## 2015-04-01 LAB — I-STAT TROPONIN, ED
Troponin i, poc: 0 ng/mL (ref 0.00–0.08)
Troponin i, poc: 0 ng/mL (ref 0.00–0.08)

## 2015-04-01 LAB — CBC
HCT: 31.5 % — ABNORMAL LOW (ref 36.0–46.0)
Hemoglobin: 9.2 g/dL — ABNORMAL LOW (ref 12.0–15.0)
MCH: 22.6 pg — ABNORMAL LOW (ref 26.0–34.0)
MCHC: 29.2 g/dL — ABNORMAL LOW (ref 30.0–36.0)
MCV: 77.4 fL — AB (ref 78.0–100.0)
Platelets: 348 10*3/uL (ref 150–400)
RBC: 4.07 MIL/uL (ref 3.87–5.11)
RDW: 18.4 % — ABNORMAL HIGH (ref 11.5–15.5)
WBC: 7.8 10*3/uL (ref 4.0–10.5)

## 2015-04-01 LAB — POC URINE PREG, ED: PREG TEST UR: NEGATIVE

## 2015-04-01 MED ORDER — OXYCODONE-ACETAMINOPHEN 5-325 MG PO TABS
ORAL_TABLET | ORAL | Status: AC
  Filled 2015-04-01: qty 1

## 2015-04-01 MED ORDER — OXYCODONE HCL 5 MG PO TABS: 5.0000 mg | ORAL_TABLET | ORAL | Status: DC | PRN

## 2015-04-01 MED ORDER — MORPHINE SULFATE (PF) 4 MG/ML IV SOLN
4.0000 mg | Freq: Once | INTRAVENOUS | Status: AC
  Administered 2015-04-01: 4 mg via INTRAVENOUS
  Filled 2015-04-01: qty 1

## 2015-04-01 MED ORDER — OXYCODONE-ACETAMINOPHEN 5-325 MG PO TABS
1.0000 | ORAL_TABLET | Freq: Once | ORAL | Status: AC
  Administered 2015-04-01: 1 via ORAL

## 2015-04-01 MED ORDER — ONDANSETRON HCL 4 MG/2ML IJ SOLN
4.0000 mg | Freq: Once | INTRAMUSCULAR | Status: AC
  Administered 2015-04-01: 4 mg via INTRAVENOUS
  Filled 2015-04-01: qty 2

## 2015-04-01 NOTE — ED Notes (Signed)
Pt back from [facility]

## 2015-04-01 NOTE — ED Provider Notes (Signed)
CSN: [id number]     Arrival date & time 04/01/15  1353 History   First MD Initiated Contact with Patient 04/01/15 1926     Chief Complaint  Patient presents with  . Chest Pain  . Back Pain     (Consider location/radiation/quality/duration/timing/severity/associated sxs/prior Treatment) HPI Gina Shelton is a 40 y.o. female with history of depression, GERD, migraines, anxiety, presents to emergency department complaining of right flank pain and right upper abdominal pain. Patient states symptoms started several days ago. 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 States pain is up and down right-sided of the back, worsens with movement and palpation. States pain is not radiating to the right upper quadrant and right flank. She states pain is also worsened with coughing. She denies any worsening and cough itself. Denies any fever or chills. No urinary symptoms. She denies any vomiting but states that she does have some nausea that is worse after eating. She reports prolonged history of alcohol abuse, states she went to detox and has been alcohol free for a few months. She denies any prior abdominal surgeries other than cesarean section and tubal ligation. She was seen yesterday for the same and given Robaxin for possible muscular pain and states it did not help. Patient was given Percocet for pain and awaiting her when she states helped some.  Past Medical History  Diagnosis Date  . Depression   . GERD (gastroesophageal reflux disease)   . Migraine   . Gout     ble  . Depression   . Migraine   . Anxiety    Past Surgical History  Procedure Laterality Date  . Cesarean section  infection at incission requiring return to OR x 2  . Tubal ligation     Family History  Problem Relation Age of Onset  . Anesthesia problems Neg Hx   . Hypotension Neg Hx   . Malignant hyperthermia Neg Hx   . Pseudochol deficiency Neg Hx   .  Hypertension Mother   . Arthritis Sister    Social History  Substance Use Topics  . Smoking status: Current Every Day Smoker -- 0.00 packs/day for 0 years    Types: Cigarettes  . Smokeless tobacco: Never Used     Comment: Currently using Nicotine patch  . Alcohol Use: 0.0 oz/week   OB History    Gravida Para Term Preterm AB TAB SAB Ectopic Multiple Living   3 3        3      Review of Systems  Constitutional: Negative for fever and chills.  Respiratory: Negative for cough, chest tightness and shortness of breath.   Cardiovascular: Negative for chest pain, palpitations and leg swelling.  Gastrointestinal: Positive for nausea and abdominal pain. Negative for vomiting and diarrhea.  Genitourinary: Positive for flank pain. Negative for dysuria and pelvic pain.  Musculoskeletal: Positive for back pain. Negative for myalgias, arthralgias, neck pain and neck stiffness.  Skin: Negative for rash.  Neurological: Negative for dizziness, weakness and headaches.  All other systems reviewed and are negative.     Allergies  Review of patient's allergies indicates no known allergies.  Home Medications   Prior to Admission medications   Medication Sig Start Date End Date Taking? Authorizing Provider  Acetaminophen-Caff-Pyrilamine (MIDOL COMPLETE PO) Take 2 tablets by mouth 2 (two) times daily as needed (menstrual cramps).     Historical Provider, MD  albuterol (PROVENTIL  HFA;VENTOLIN HFA) 108 (90 BASE) MCG/ACT inhaler Inhale 1-2 puffs into the lungs every 4 (four) hours as needed for wheezing or shortness of breath (cough). 11/21/13   Dahlia Bailiff, PA-C  albuterol (PROVENTIL HFA;VENTOLIN HFA) 108 (90 BASE) MCG/ACT inhaler Inhale 2 puffs into the lungs every 4 (four) hours as needed for wheezing or shortness of breath. 12/20/14   Shawn C Joy, PA-C  benzonatate (TESSALON) 100 MG capsule Take 1 capsule (100 mg total) by mouth every 8 (eight) hours as needed for cough. 12/21/14   Shawn C Joy, PA-C   carbamazepine (TEGRETOL XR) 200 MG 12 hr tablet Take 1 tablet (200 mg total) by mouth 2 (two) times daily. Mood stabilization. 06/08/13   Patrecia Pour, NP  citalopram (CELEXA) 20 MG tablet Take 20 mg by mouth daily.    Historical Provider, MD  gabapentin (NEURONTIN) 100 MG capsule Take 1 capsule (100 mg total) by mouth 3 (three) times daily. Anxiety, neuropathic pain. 06/08/13   Patrecia Pour, NP  hydrOXYzine (ATARAX/VISTARIL) 10 MG tablet Take 1 tablet (10 mg total) by mouth every 6 (six) hours as needed for itching. Patient taking differently: Take 10 mg by mouth daily after lunch.  09/21/14   Montine Circle, PA-C  medroxyPROGESTERone (PROVERA) 5 MG tablet Take 2 tablets (10 mg total) by mouth daily. 11/07/14   Forde Dandy, MD  methocarbamol (ROBAXIN) 500 MG tablet Take 1 tablet (500 mg total) by mouth 2 (two) times daily as needed for muscle spasms. 03/31/15   Waynetta Pean, PA-C  naproxen (NAPROSYN) 250 MG tablet Take 1 tablet (250 mg total) by mouth 2 (two) times daily with a meal. 03/31/15   Waynetta Pean, PA-C  oxyCODONE (OXY IR/ROXICODONE) 5 MG immediate release tablet Take 1 tablet (5 mg total) by mouth once. Patient taking differently: Take 5 mg by mouth once. Daily 09/28/14   Tori Milks, MD  oxyCODONE-acetaminophen (PERCOCET/ROXICET) 5-325 MG per tablet Take 1-2 tablets by mouth every 6 (six) hours as needed for severe pain. 05/29/14   Luvenia Redden, PA-C  predniSONE (DELTASONE) 10 MG tablet Take 4 tablets (40 mg total) by mouth daily. 12/20/14   Shawn C Joy, PA-C  risperiDONE (RISPERDAL) 1 MG tablet Take 1 tablet (1 mg total) by mouth at bedtime. 06/08/13   Patrecia Pour, NP  traZODone (DESYREL) 100 MG tablet Take 1 tablet (100 mg total) by mouth at bedtime as needed for sleep. Patient taking differently: Take 100 mg by mouth at bedtime.  06/08/13   Patrecia Pour, NP   BP 143/95 mmHg  Pulse 79  Temp(Src) 98.4 F (36.9 C) (Oral)  Resp 12  Ht 5\' 2"  (1.575 m)  Wt 79.379 kg  BMI  32.00 kg/m2  SpO2 99%  LMP 03/29/2015 Physical Exam  Constitutional: She appears well-developed and well-nourished. No distress.  HENT:  Head: Normocephalic.  Eyes: Conjunctivae are normal.  Neck: Neck supple.  Cardiovascular: Normal rate, regular rhythm and normal heart sounds.   Pulmonary/Chest: Effort normal and breath sounds normal. No respiratory distress. She has no wheezes. She has no rales.  Abdominal: Soft. Bowel sounds are normal. She exhibits no distension. There is tenderness. There is no rebound.  RUQ and epigastric tenderness. No guarding. Right CVA tenderness.  Musculoskeletal: She exhibits no edema.  Neurological: She is alert.  Skin: Skin is warm and dry.  Psychiatric: She has a normal mood and affect. Her behavior is normal.  Nursing note and vitals reviewed.   ED Course  Procedures (including critical care time) Labs Review Labs Reviewed  CBC - Abnormal; Notable for the following:    Hemoglobin 9.2 (*)    HCT 31.5 (*)    MCV 77.4 (*)    MCH 22.6 (*)    MCHC 29.2 (*)    RDW 18.4 (*)    All other components within normal limits  HEPATIC FUNCTION PANEL - Abnormal; Notable for the following:    Albumin 3.2 (*)    Total Bilirubin <0.1 (*)    Bilirubin, Direct <0.1 (*)    All other components within normal limits  BASIC METABOLIC PANEL  URINALYSIS, ROUTINE W REFLEX MICROSCOPIC (NOT AT [facility])  LIPASE, BLOOD  I-STAT TROPOININ, ED  I-STAT TROPOININ, ED  POC URINE PREG, ED    Imaging Review Dg Chest 2 View  04/01/2015  CLINICAL DATA:  Chest pain and back pain after lifting injury today. EXAM: CHEST  2 VIEW COMPARISON:  12/20/2014 FINDINGS: The heart size and mediastinal contours are within normal limits. Both lungs are clear. The visualized skeletal structures are unremarkable. IMPRESSION: Normal chest. Electronically Signed   By: Lorriane Shire M.D.   On: 04/01/2015 14:54   I have personally reviewed and evaluated these images and lab results as part of my  medical decision-making.   EKG Interpretation None      MDM   Final diagnoses:  Liver lesion  Flank pain    patient with back pain, right flank pain, right upper quadrant pain. Exam is reproduced with palpation of both back and abdomen. Right CVA tenderness is present. Vital signs are normal. Patient is afebrile. Will get labs and urinalysis. Patient is a heavy drinker, but states has not had any alcohol recently.  The patient continues to have right upper quadrant pain. Her labs unremarkable. Will get ultrasound to evaluate for gallbladder disease. Her abdomen is nonsurgical, do not think she needs a CT scan. Her pain improved with pain medications. Urinalysis is negative. No signs of infection. No blood, do not think is a kidney stone.  11:04 PM Ultrasound shows a lesion in the liver. Outpatient MRI is recommended. We'll discharge home with pain medications, follow-up with primary care doctor for further studies. Question whether this could be musculoskeletal pain. Advised patient to follow-up. Return precautions strictly discussed. Vital signs continued to be normal. Patient in no distress.  Filed Vitals:   04/01/15 1942 04/01/15 2000 04/01/15 2038 04/01/15 2100  BP: 143/95 128/90 140/92 137/93  Pulse: 79 84  79  Temp:      TempSrc:      Resp: 12 14  14   Height:      Weight:      SpO2: 99% 99%  98%     Jeannett Senior, PA-C 04/01/15 [place], MD 04/01/15 2337

## 2015-04-01 NOTE — ED Notes (Signed)
Pt reports being seen yesterday for same. Having sharp chest pains, back pain and right side pain. Increases with movement. Was given robaxin with no relief.

## 2015-04-01 NOTE — Discharge Instructions (Signed)
Take percocet as prescribed as needed for pain. Rest. Avoid alcohol. Follow up with primary care doctor regarding your liver lesion.   Flank Pain Flank pain refers to pain that is located on the side of the body between the upper abdomen and the back. The pain may occur over a short period of time (acute) or may be long-term or reoccurring (chronic). It may be mild or severe. Flank pain can be caused by many things. CAUSES  Some of the more common causes of flank pain include:  Muscle strains.   Muscle spasms.   A disease of your spine (vertebral disk disease).   A lung infection (pneumonia).   Fluid around your lungs (pulmonary edema).   A kidney infection.   Kidney stones.   A very painful skin rash caused by the chickenpox virus (shingles).   Gallbladder disease.  [place] care will depend on the cause of your pain. In general,  Rest as directed by your caregiver.  Drink enough fluids to keep your urine clear or pale yellow.  Only take over-the-counter or prescription medicines as directed by your caregiver. Some medicines may help relieve the pain.  Tell your caregiver about any changes in your pain.  Follow up with your caregiver as directed. SEEK IMMEDIATE MEDICAL CARE IF:   Your pain is not controlled with medicine.   You have new or worsening symptoms.  Your pain increases.   You have abdominal pain.   You have shortness of breath.   You have persistent nausea or vomiting.   You have swelling in your abdomen.   You feel faint or pass out.   You have blood in your urine.  You have a fever or persistent symptoms for more than 2-3 days.  You have a fever and your symptoms suddenly get worse. MAKE SURE YOU:   Understand these instructions.  Will watch your condition.  Will get help right away if you are not doing well or get worse.   This information is not intended to replace advice given to you by your health  care provider. Make sure you discuss any questions you have with your health care provider.   Document Released: 04/21/2005 Document Revised: 11/23/2011 Document Reviewed: 10/13/2011 Elsevier Interactive Patient Education 2016 [employer].    Emergency Department Resource Guide 1) Find a Doctor and Pay Out of Pocket Although you won't have to find out who is covered by your insurance plan, it is a good idea to ask around and get recommendations. You will then need to call the office and see if the doctor you have chosen will accept you as a new patient and what types of options they offer for patients who are self-pay. Some doctors offer discounts or will set up payment plans for their patients who do not have insurance, but you will need to ask so you aren't surprised when you get to your appointment.  2) Contact Your Local Health Department Not all health departments have doctors that can see patients for sick visits, but many do, so it is worth a call to see if yours does. If you don't know where your local health department is, you can check in your phone book. The CDC also has a tool to help you locate your state's health department, and many state websites also have listings of all of their local health departments.  3) Find a [facility] If your illness is not likely to be very severe or complicated,  you may want to try a walk in clinic. These are popping up all over the country in pharmacies, drugstores, and shopping centers. They're usually staffed by nurse practitioners or physician assistants that have been trained to treat common illnesses and complaints. They're usually fairly quick and inexpensive. However, if you have serious medical issues or chronic medical problems, these are probably not your best option.  No Primary Care Doctor: - Call Health Connect at  [phone] - they can help you locate a primary care doctor that  accepts your insurance, provides certain services,  etc. - Physician Referral Service- [phone]  Chronic Pain Problems: Organization         Address  Phone   Notes  [facility]  [phone] Patients need to be referred by their primary care doctor.   Medication Assistance: Organization         Address  Phone   Notes  [facility] Medication [facility] [address] [place], [place] [address] [phone] --Must be a resident of [facility] -- Must have NO insurance coverage whatsoever (no Medicaid/ Medicare, etc.) -- The pt. MUST have a primary care doctor that directs their care regularly and follows them in the community   [facility]ist  [phone]   [employer]  [phone]    Agencies that provide inexpensive medical care: Organization         Address  Phone   Notes  [place]  [phone]   [facility]    [phone]   [facility] [place], [place] [address] [phone]   [place] [address] [place] [phone]   Planned Parenthood    [phone]   [facility]    [phone]   [place] and [address], [place] Phone:  [phone], Fax:  [phone] Hours of Operation:  9 am - 6 pm, M-F.  Also accepts Medicaid/Medicare and self-pay.  [facility] for [place], [address], [place] Phone: [phone], Fax: [phone]. Hours of Operation:  8:30 am - 5:30 pm, M-F.  Also accepts Medicaid and self-pay.  [facility] [place] [address] [place] Phone: [phone]   [place], [place], [place] [phone], Ext. [phone] Mondays & Thursdays: 7-9 AM.  First 15 patients are seen on a first come, first serve basis.    [place] Providers:  Organization         Address  Phone   Notes  [facility] [address], [place] [phone] Also accepts self-pay patients.  [facility] [id number] [place], [place]  [phone]   [place], [address], [place] [phone]   [facility] [address] [place] [phone]   Lucianne Lei [address], [place]   [phone] Only accepts [place] Access [place] patients after they have their name applied to their card.   Self-Pay (no insurance) in [facility] [place]:  Organization         Address  Phone   Notes  Sickle Cell Patients, [facility] [place] [phone]   [facility] Urgent Care [place],  [place] [phone]   [facility] Urgent Care [place]  [address], [address], [place] [phone]   [facility]/Dr. Osei-Bonsu  [address] [place] or [address], [place] [phone] Phone number for both [place] and [place] locations is the same.  Urgent Medical and [facility] [address], [place] [phone]   [facility] [address], [place] or [address] [phone]   [facility] [address] [place] [phone], phone; [phone], fax Sees patients 1st and 3rd Saturday of every month.  Must not qualify for public or private insurance (i.e. Medicaid, Medicare, [place] Health Choice, Veterans' Benefits)  Household income should be no more than 200% of the poverty level The clinic cannot treat you if you are pregnant or think you are pregnant  Sexually transmitted diseases are not treated at the clinic.    Dental Care: Organization         Address  Phone  Notes  [facility] Department of [facility] [place] [phone] Accepts children up to  age 66 who are enrolled in [place] or [place]; pregnant women with a Medicaid card; and children who have applied for Medicaid or [place] Health Choice, but were declined, whose parents can pay a reduced fee at time of service.  [facility] Department of [facility]  [address], [place] [phone] Accepts children up to age 88 who are enrolled in [place] or [place]; pregnant women with a Medicaid card; and children who have applied for Medicaid or [place] Health Choice, but were declined, whose parents can pay a reduced fee at time of service.  [place] Adult Dental Access PROGRAM  [place] [phone] Patients are seen by appointment only. Walk-ins are not accepted. [place] will see patients 72 years of age and older. Monday - Tuesday (8am-5pm) Most Wednesdays (8:30-5pm) $30 per visit, cash only  [facility] Adult [facility] Access PROGRAM  [address], [facility] [phone] Patients are seen by appointment only. Walk-ins are not accepted. [place] will see patients 41 years of age and older. One Wednesday Evening (Monthly: Volunteer Based).  $30 per visit, cash only  [place]  [phone] for adults; Children under age 22, call Graduate Pediatric Dentistry at [phone]. Children aged 73-14, please call [phone] to request a pediatric application.  Dental services are provided in all areas of dental care including fillings, crowns and bridges, complete and partial dentures, implants, gum treatment, root canals, and extractions. Preventive care is also provided. Treatment is provided to both adults and children. Patients are selected via a lottery and there is often a waiting list.   [facility] [address], [place]  [phone] [email or web address]   [facility] [address] [place], [place] [phone], Ext. [phone] Second and Fourth Thursday of  each month, opens at 6:30 AM; Clinic ends at 9 AM.  Patients are seen on a first-come first-served basis, and a limited number are seen during each clinic.   [facility]  [address] Hillard Danker [place], [place] [phone]   Eligibility Requirements You must have lived in [place], [place], or [place] counties for at least the last three months.   You cannot be eligible  for state or [employer], including [employer], [place], or [employer].   You generally cannot be eligible for healthcare insurance through your employer.    How to apply: Eligibility screenings are held every Tuesday and Wednesday afternoon from 1:00 pm until 4:00 pm. You do not need an appointment for the interview!  [facility] [address] [place], [place]   [place]  [place] Department  [place]  [phone]    Behavioral Health Resources in the Community: Intensive Outpatient Programs Organization         Address  Phone  Notes  [place]. [address] [place], [place] [phone]   [facility] Outpatient [address] [place], [place]   ADS: Alcohol & Drug [facility] [address] [place], [place]   [place] [address]  [place], [place] or [phone]   Substance Abuse Resources Organization         Address  Phone  Notes  Alcohol and Drug Services  [phone]   [place]  [phone]   [facility] [place]   Chinita Pester  [phone]   Residential & Outpatient Substance Abuse Program  [phone]   Psychological Services Organization         Address  Phone  Notes  [facility]  [place]  [phone]   [place] [address]  [place] or [phone]    Mobile Crisis Teams Organization         Address  Phone  Notes  [facility], Mobile Crisis Care Unit  [phone]   [facility]  [address] [place], [place]   [place] [address] [place] [phone]    Self-Help/Support Groups Organization         Address  Phone             Notes  [place]. of [place] - variety of support groups  [place] Call for more information  Narcotics Anonymous (NA), [facility] [address], [employer] [place]  2 meetings at this location   Special educational needs teacher         Address  Phone  Notes  [facility] [place],    [place]  [phone]   [facility]  [address] [place] [id number], [place], [place]   [place], [place] [phone] Admissions: 8am-3pm M-F  Incentives Substance [place] [address],    [place], [place] [phone]   [facility] [address] Jadene Pierini [place], [place]   [facility] [address],  [place], [place]   Insight Programs - Intensive Outpatient [address], [place], [place]   [facility] [place].,  [place], [place] [phone] or [phone]   [facility] ([facility]) [address] [place], [place] Accepts Medicaid  [facility] [place] [address],  [place] [phone] Substance Abuse/Addiction Treatment   [facility] Organization         Address  Phone  Notes  [facility]  [phone]   Domenic Schwab, PhD [place], [address] [place], [place]   [phone] or [phone]) [phone]   [facility]   [address] [place], [place] [phone]     [address], [place], [place] [phone] Insurance/Medicaid/sponsorship through [facility] and Families [address], [address] [place]                                    [place], [place] [phone] [place] [address]   [place], [place] [phone]    Dr. Adele Schilder  [phone]   [facility] Dept. 1) [address], [place] 2) [place] 3)  [address], [place] [phone]  [phone]   [place] [phone] or [phone] (After Hours)

## 2015-04-01 NOTE — ED Notes (Signed)
PA student Marylyn Ishihara)  at bedside.

## 2015-04-01 NOTE — ED Notes (Signed)
Patient transported to Ultrasound 

## 2015-04-02 ENCOUNTER — Encounter (HOSPITAL_COMMUNITY): Payer: Self-pay | Admitting: *Deleted

## 2015-04-02 ENCOUNTER — Emergency Department (HOSPITAL_COMMUNITY)
Admission: EM | Admit: 2015-04-02 | Discharge: 2015-04-02 | Disposition: A | Discharge status: Home / Self Care | Payer: Self-pay | Attending: Emergency Medicine | Admitting: Emergency Medicine

## 2015-04-02 DIAGNOSIS — M549 Dorsalgia, unspecified: Secondary | ICD-10-CM | POA: Insufficient documentation

## 2015-04-02 DIAGNOSIS — G43909 Migraine, unspecified, not intractable, without status migrainosus: Secondary | ICD-10-CM | POA: Insufficient documentation

## 2015-04-02 DIAGNOSIS — K219 Gastro-esophageal reflux disease without esophagitis: Secondary | ICD-10-CM | POA: Insufficient documentation

## 2015-04-02 DIAGNOSIS — R0602 Shortness of breath: Secondary | ICD-10-CM | POA: Insufficient documentation

## 2015-04-02 DIAGNOSIS — Z79899 Other long term (current) drug therapy: Secondary | ICD-10-CM | POA: Insufficient documentation

## 2015-04-02 DIAGNOSIS — Z793 Long term (current) use of hormonal contraceptives: Secondary | ICD-10-CM | POA: Insufficient documentation

## 2015-04-02 DIAGNOSIS — F419 Anxiety disorder, unspecified: Secondary | ICD-10-CM | POA: Insufficient documentation

## 2015-04-02 DIAGNOSIS — F329 Major depressive disorder, single episode, unspecified: Secondary | ICD-10-CM | POA: Insufficient documentation

## 2015-04-02 DIAGNOSIS — F17210 Nicotine dependence, cigarettes, uncomplicated: Secondary | ICD-10-CM | POA: Insufficient documentation

## 2015-04-02 DIAGNOSIS — R109 Unspecified abdominal pain: Secondary | ICD-10-CM

## 2015-04-02 DIAGNOSIS — R079 Chest pain, unspecified: Secondary | ICD-10-CM | POA: Insufficient documentation

## 2015-04-02 DIAGNOSIS — R1011 Right upper quadrant pain: Secondary | ICD-10-CM | POA: Insufficient documentation

## 2015-04-02 LAB — I-STAT TROPONIN, ED: Troponin i, poc: 0 ng/mL (ref 0.00–0.08)

## 2015-04-02 LAB — D-DIMER, QUANTITATIVE: D-Dimer, Quant: 0.36 ug/mL-FEU (ref 0.00–0.50)

## 2015-04-02 MED ORDER — ONDANSETRON HCL 4 MG/2ML IJ SOLN
INTRAMUSCULAR | Status: AC
  Filled 2015-04-02: qty 2

## 2015-04-02 MED ORDER — ONDANSETRON 4 MG PO TBDP: 4.0000 mg | ORAL_TABLET | Freq: Three times a day (TID) | ORAL | Status: DC | PRN

## 2015-04-02 MED ORDER — ONDANSETRON HCL 4 MG/2ML IJ SOLN
4.0000 mg | Freq: Once | INTRAMUSCULAR | Status: AC
  Administered 2015-04-02: 4 mg via INTRAVENOUS

## 2015-04-02 MED ORDER — MORPHINE SULFATE (PF) 4 MG/ML IV SOLN
4.0000 mg | Freq: Once | INTRAVENOUS | Status: AC
  Administered 2015-04-02: 4 mg via INTRAVENOUS
  Filled 2015-04-02: qty 1

## 2015-04-02 NOTE — ED Notes (Signed)
Pt to ED c/o worsening pain and shortness of breath. Was seen and discharged earlier tonight, reports pain in back and R side has become worse. Also reports mid chest pain, nonradiating

## 2015-04-02 NOTE — Discharge Instructions (Signed)
1. Medications: alternate taking prescribed anti-inflammatory (naproxen) and pain medication (oxycodone), robaxin, zofran for nausea, usual home medications 2. Treatment: rest, drink plenty of fluids 3. Follow Up: please followup with your primary doctor for discussion of your diagnoses and further evaluation after today's visit; please return to the ER for severe pain, numbness, weakness, new or worsening symptoms   Musculoskeletal Pain Musculoskeletal pain is muscle and boney aches and pains. These pains can occur in any part of the body. Your caregiver may treat you without knowing the cause of the pain. They may treat you if blood or urine tests, X-rays, and other tests were normal.  CAUSES There is often not a definite cause or reason for these pains. These pains may be caused by a type of germ (virus). The discomfort may also come from overuse. Overuse includes working out too hard when your body is not fit. Boney aches also come from weather changes. Bone is sensitive to atmospheric pressure changes. HOME CARE INSTRUCTIONS   Ask when your test results will be ready. Make sure you get your test results.  Only take over-the-counter or prescription medicines for pain, discomfort, or fever as directed by your caregiver. If you were given medications for your condition, do not drive, operate machinery or power tools, or sign legal documents for 24 hours. Do not drink alcohol. Do not take sleeping pills or other medications that may interfere with treatment.  Continue all activities unless the activities cause more pain. When the pain lessens, slowly resume normal activities. Gradually increase the intensity and duration of the activities or exercise.  During periods of severe pain, bed rest may be helpful. Lay or sit in any position that is comfortable.  Putting ice on the injured area.  Put ice in a bag.  Place a towel between your skin and the bag.  Leave the ice on for 15 to 20 minutes,  3 to 4 times a day.  Follow up with your caregiver for continued problems and no reason can be found for the pain. If the pain becomes worse or does not go away, it may be necessary to repeat tests or do additional testing. Your caregiver may need to look further for a possible cause. SEEK IMMEDIATE MEDICAL CARE IF:  You have pain that is getting worse and is not relieved by medications.  You develop chest pain that is associated with shortness or breath, sweating, feeling sick to your stomach (nauseous), or throw up (vomit).  Your pain becomes localized to the abdomen.  You develop any new symptoms that seem different or that concern you. MAKE SURE YOU:   Understand these instructions.  Will watch your condition.  Will get help right away if you are not doing well or get worse.   This information is not intended to replace advice given to you by your health care provider. Make sure you discuss any questions you have with your health care provider.   Document Released: 02/28/2005 Document Revised: 05/23/2011 Document Reviewed: 11/02/2012 Elsevier Interactive Patient Education 2016 [employer].   Emergency Department Resource Guide 1) Find a Doctor and Pay Out of Pocket Although you won't have to find out who is covered by your insurance plan, it is a good idea to ask around and get recommendations. You will then need to call the office and see if the doctor you have chosen will accept you as a new patient and what types of options they offer for patients who are self-pay. Some doctors  offer discounts or will set up payment plans for their patients who do not have insurance, but you will need to ask so you aren't surprised when you get to your appointment.  2) Contact Your Local Health Department Not all health departments have doctors that can see patients for sick visits, but many do, so it is worth a call to see if yours does. If you don't know where your local health department  is, you can check in your phone book. The CDC also has a tool to help you locate your state's health department, and many state websites also have listings of all of their local health departments.  3) Find a [facility] If your illness is not likely to be very severe or complicated, you may want to try a walk in clinic. These are popping up all over the country in pharmacies, drugstores, and shopping centers. They're usually staffed by nurse practitioners or physician assistants that have been trained to treat common illnesses and complaints. They're usually fairly quick and inexpensive. However, if you have serious medical issues or chronic medical problems, these are probably not your best option.  No Primary Care Doctor: - Call Health Connect at  [phone] - they can help you locate a primary care doctor that  accepts your insurance, provides certain services, etc. - Physician Referral Service- [phone]  Chronic Pain Problems: Organization         Address  Phone   Notes  [facility]  [phone] Patients need to be referred by their primary care doctor.   Medication Assistance: Organization         Address  Phone   Notes  [facility] [address] [place], [place] [address] [phone] --Must be a resident of [facility] -- Must have NO insurance coverage whatsoever (no Medicaid/ Medicare, etc.) -- The pt. MUST have a primary care doctor that directs their care regularly and follows them in the community   [facility]ist  [phone]   [employer]  [phone]    Agencies that provide inexpensive medical care: Organization         Address  Phone   Notes  [place]  [phone]   [facility]    [phone]   [facility] [place], [place] [address] [phone]   [place] [address], [place]  [phone]   Planned Parenthood    [phone]   [facility]    [phone]   [place] [address] [place], [place] Phone:  [phone], Fax:  [phone] Hours of Operation:  9 am - 6 pm, M-F.  Also accepts Medicaid/Medicare and self-pay.  [facility] for [place], [address], [place] Phone: [phone], Fax: [phone]. Hours of Operation:  8:30 am - 5:30 pm, M-F.  Also accepts Medicaid and self-pay.  [facility] [place] [address], [place] Phone: [phone]   [place], [place], [place] [phone], Ext. [phone] Mondays & Thursdays: 7-9 AM.  First 15 patients are seen on a first come, first serve basis.    [place] Providers:  Organization         Address  Phone   Notes  [facility] [address], Kristeen Mans  A, [place] [phone] Also accepts self-pay patients.  [facility] [id number] [place], [place]  [phone]   [place], [address], [place] [phone]   [facility] [address], [place] [phone]   Lucianne Lei [address], [place]   [phone] Only accepts [place] Access [place] patients after they have their name applied to their card.   Self-Pay (no insurance) in [facility]:  Organization         Address  Phone   Notes  Sickle Cell Patients, [facility] [place] [phone]   [facility] [place] [phone]   [facility] [place]  [place], [place], [place] [phone]   [facility]/Dr. Osei-Bonsu  [address], [place] [address], [place] [phone] Phone number for both [place] and [place]  locations is the same.  Urgent Medical and [facility] [address], [place] [phone]   [facility] [address] [place] or [address] [phone]   [facility] [address], [place] [phone], phone; [phone], fax Sees patients 1st and 3rd Saturday of every month.  Must not qualify for public or private insurance (i.e. Medicaid, Medicare, [place] Health Choice, Veterans' Benefits)  Household income should be no more than 200% of the poverty level The clinic cannot treat you if you are pregnant or think you are pregnant  Sexually transmitted diseases are not treated at the clinic.    Dental Care: Organization         Address  Phone  Notes  [facility] Department of [facility] [phone] Accepts children up to age 41 who are enrolled in [place] or [place]; pregnant women with a Medicaid card; and children who have applied for Medicaid or [place] Health Choice, but were declined, whose parents can pay a reduced fee at time of service.  [facility] Department of [facility]  [address], [place] [phone] Accepts children up to age 78 who are enrolled in [place] or [place]; pregnant women with a Medicaid card; and children who have applied for Medicaid or [place] Health Choice, but were declined, whose parents can pay a reduced fee at time of service.  [place] Adult Dental Access PROGRAM  [place] [phone] Patients are seen by appointment only. Walk-ins are not accepted. [place] will see patients 71 years of age and older. Monday - Tuesday (8am-5pm) Most Wednesdays (8:30-5pm) $30 per visit, cash only  [facility] Adult Dental Access PROGRAM  [address], [facility] [phone] Patients are seen by appointment only. Walk-ins are not accepted. [place]  will see patients 50 years of age and older. One Wednesday Evening (Monthly: Volunteer Based).  $30 per visit, cash only  [place]  [phone] for adults; Children under age 33, call Graduate Pediatric Dentistry at [phone]. Children aged 92-14, please call [phone] to request a pediatric application.  Dental services are provided in all areas of dental care including fillings, crowns and bridges, complete and partial dentures, implants, gum treatment, root canals, and extractions. Preventive care  is also provided. Treatment is provided to both adults and children. Patients are selected via a lottery and there is often a waiting list.   [facility] [address] [place]  [phone] [email or web address]   [facility] [address] [place], [place] [phone], Ext. [phone] Second and Fourth Thursday of each month, opens at 6:30 AM; Clinic ends at 9 AM.  Patients are seen on a first-come first-served basis, and a limited number are seen during each clinic.   [facility]  [address] [place], [place] [phone]   Eligibility Requirements You must have lived in [place], [place], or [place] counties for at least the last three months.   You cannot be eligible for state or federal sponsored [employer], including [employer], [place], or [employer].   You generally cannot be eligible for healthcare insurance through your employer.    How to apply: Eligibility screenings are held every Tuesday and Wednesday afternoon from 1:00 pm until 4:00 pm. You do not need an appointment for the interview!  [facility] [address], [place], [place]   [place]  [place] Department  [place]  [phone]    Behavioral Health Resources in the Community: Intensive Outpatient  Programs Organization         Address  Phone  Notes  [place]. [address] [place], [place] [phone]   [facility]patient [address] [place], [place]   ADS: Alcohol & Drug Svcs [address] [place], [place]   [place] [address]  [place], [place] or [phone]   Substance Abuse Resources Organization         Address  Phone  Notes  Al[facility] Drug Services  [phone]   [place]  [phone]   [facility] [place]   Chinita Pester  [phone]   Residential & Outpatient Substance Abuse Program  [phone]   Psychological Services Organization         Address  Phone  Notes  [facility]  [place]  [phone]   [facility] [address], [place] or [phone]    Mobile Crisis Teams Organization         Address  Phone  Notes  [facility], Mobile Crisis Care Unit  [phone]   [facility]  [address] [place], [place]   [place] [address] [place] [phone]    Self-Help/Support Groups Organization         Address  Phone             Notes  [place]. of [address] - variety of support groups  [place] Call for more information  Narcotics Anonymous (NA), [facility] [address], [employer] [place]  2 meetings at this location   Special educational needs teacher         Address  Phone  Notes  [facility] Residential Treatment [place],    [place]  [phone]   [facility]  [address], [place] [id number], [place], [place]   [place], [place] [phone] Admissions: 8am-3pm M-F  Incentives Substance [place] [address]  [place], [place]   [facility] [address] Jadene Pierini [place], [place]   [place].,  [place], [place]   [facility] - Intensive Outpatient [address] Kristeen Mans [address], [place], [place]   [facility] [address]  [place], [place] [phone] or [phone]   [facility] (RTS) [address] [place], [place] Accepts Medicaid  [facility] [place] [address]  [place] [phone] Substance Abuse/Addiction Treatment   [facility] Organization         Address  Phone  Notes  [facility]  [phone]   Domenic Schwab, PhD [address] Arlis Porta [place], [place]   [phone] or [phone]   Louann [place], [place] [phone]   [facility] [address], [place], [place] [phone] Insurance/Medicaid/sponsorship through [facility] and Families [address] [place]                                    [place], [place] [phone] [place] [address], [place] [phone]    Dr. Adele Schilder  [phone]   [facility] Dept. 1) [address] [place] 2) [place] 3)  [place] [address], [place] [phone]  [phone]   [place] [phone] or [phone] (After Hours)

## 2015-04-02 NOTE — ED Notes (Signed)
Pt states understanding of instructions. Home with sister via w/c

## 2015-04-02 NOTE — ED Provider Notes (Signed)
CSN: [id number]     Arrival date & time 04/02/15  [id number] History   First MD Initiated Contact with Patient 04/02/15 0554     Chief Complaint  Patient presents with  . Back Pain  . Shortness of Breath     HPI   Gina Shelton is a 40 y.o. female with a PMH of GERD, migraines, depression, anxiety who presents to the ED with chest pain, abdominal pain, and back pain. She reports chest pain and abdominal pain for the past week and back pain x 2-3 days. She states her symptoms are progressively worsening and now constant. She reports movement exacerbates her pain. She has tried robaxin and oxycodone for symptom relief, which have been minimally effective. She reports associated shortness of breath and nausea. She denies fever, chills, vomiting, diarrhea, constipation, dysuria, urgency, frequency, numbness, weakness, paresthesia, bowel or bladder incontinence, saddle anesthesia, history of malignancy, recent travel or immobility, recent surgery, history of DVT/PE, estrogen use.   Past Medical History  Diagnosis Date  . Depression   . GERD (gastroesophageal reflux disease)   . Migraine   . Gout     ble  . Depression   . Migraine   . Anxiety    Past Surgical History  Procedure Laterality Date  . Cesarean section  infection at incission requiring return to OR x 2  . Tubal ligation     Family History  Problem Relation Age of Onset  . Anesthesia problems Neg Hx   . Hypotension Neg Hx   . Malignant hyperthermia Neg Hx   . Pseudochol deficiency Neg Hx   . Hypertension Mother   . Arthritis Sister    Social History  Substance Use Topics  . Smoking status: Current Every Day Smoker -- 0.00 packs/day for 0 years    Types: Cigarettes  . Smokeless tobacco: Never Used     Comment: Currently using Nicotine patch  . Alcohol Use: 0.0 oz/week   OB History    Gravida Para Term Preterm AB TAB SAB Ectopic Multiple Living   3 3        3       Review of Systems  Constitutional: Negative for  fever and chills.  HENT: Negative for congestion.   Respiratory: Positive for shortness of breath. Negative for cough.   Cardiovascular: Positive for chest pain.  Gastrointestinal: Positive for nausea and abdominal pain. Negative for vomiting, diarrhea and constipation.  Genitourinary: Negative for dysuria, urgency and frequency.  Musculoskeletal: Positive for back pain.  Neurological: Negative for dizziness, syncope, weakness, light-headedness, numbness and headaches.  All other systems reviewed and are negative.     Allergies  Review of patient's allergies indicates no known allergies.  Home Medications   Prior to Admission medications   Medication Sig Start Date End Date Taking? Authorizing Provider  albuterol (PROVENTIL HFA;VENTOLIN HFA) 108 (90 BASE) MCG/ACT inhaler Inhale 2 puffs into the lungs every 4 (four) hours as needed for wheezing or shortness of breath. 12/20/14  Yes Shawn C Joy, PA-C  benzonatate (TESSALON) 100 MG capsule Take 1 capsule (100 mg total) by mouth every 8 (eight) hours as needed for cough. 12/21/14  Yes Shawn C Joy, PA-C  carbamazepine (TEGRETOL XR) 200 MG 12 hr tablet Take 1 tablet (200 mg total) by mouth 2 (two) times daily. Mood stabilization. 06/08/13  Yes Patrecia Pour, NP  citalopram (CELEXA) 20 MG tablet Take 20 mg by mouth daily.   Yes Historical Provider, MD  gabapentin (NEURONTIN) 100 MG  capsule Take 1 capsule (100 mg total) by mouth 3 (three) times daily. Anxiety, neuropathic pain. 06/08/13  Yes Patrecia Pour, NP  hydrOXYzine (ATARAX/VISTARIL) 10 MG tablet Take 1 tablet (10 mg total) by mouth every 6 (six) hours as needed for itching. Patient taking differently: Take 10 mg by mouth daily after lunch.  09/21/14  Yes Montine Circle, PA-C  ibuprofen (ADVIL,MOTRIN) 200 MG tablet Take 400 mg by mouth every 6 (six) hours as needed for moderate pain.   Yes Historical Provider, MD  medroxyPROGESTERone (PROVERA) 5 MG tablet Take 2 tablets (10 mg total) by mouth  daily. 11/07/14  Yes Forde Dandy, MD  methocarbamol (ROBAXIN) 500 MG tablet Take 1 tablet (500 mg total) by mouth 2 (two) times daily as needed for muscle spasms. 03/31/15  Yes Waynetta Pean, PA-C  naproxen (NAPROSYN) 250 MG tablet Take 1 tablet (250 mg total) by mouth 2 (two) times daily with a meal. 03/31/15  Yes Waynetta Pean, PA-C  oxyCODONE (OXY IR/ROXICODONE) 5 MG immediate release tablet Take 1 tablet (5 mg total) by mouth once. Patient taking differently: Take 5 mg by mouth once. Daily 09/28/14  Yes Tori Milks, MD  oxyCODONE (ROXICODONE) 5 MG immediate release tablet Take 1 tablet (5 mg total) by mouth every 4 (four) hours as needed for severe pain. 04/01/15  Yes Tatyana Kirichenko, PA-C  predniSONE (DELTASONE) 10 MG tablet Take 4 tablets (40 mg total) by mouth daily. 12/20/14  Yes Shawn C Joy, PA-C  risperiDONE (RISPERDAL) 1 MG tablet Take 1 tablet (1 mg total) by mouth at bedtime. 06/08/13  Yes Patrecia Pour, NP  traZODone (DESYREL) 100 MG tablet Take 1 tablet (100 mg total) by mouth at bedtime as needed for sleep. Patient taking differently: Take 100 mg by mouth at bedtime.  06/08/13  Yes Patrecia Pour, NP  albuterol (PROVENTIL HFA;VENTOLIN HFA) 108 (90 BASE) MCG/ACT inhaler Inhale 1-2 puffs into the lungs every 4 (four) hours as needed for wheezing or shortness of breath (cough). 11/21/13   Dahlia Bailiff, PA-C  ondansetron (ZOFRAN ODT) 4 MG disintegrating tablet Take 1 tablet (4 mg total) by mouth every 8 (eight) hours as needed for nausea. 04/02/15   Marella Chimes, PA-C  oxyCODONE-acetaminophen (PERCOCET/ROXICET) 5-325 MG per tablet Take 1-2 tablets by mouth every 6 (six) hours as needed for severe pain. Patient not taking: Reported on 04/02/2015 05/29/14   Luvenia Redden, PA-C    BP 119/88 mmHg  Pulse 72  Temp(Src) 97.8 F (36.6 C) (Oral)  Resp 16  SpO2 100%  LMP 03/29/2015 Physical Exam  Constitutional: She is oriented to person, place, and time. She appears well-developed and  well-nourished. No distress.  HENT:  Head: Normocephalic and atraumatic.  Right Ear: External ear normal.  Left Ear: External ear normal.  Nose: Nose normal.  Mouth/Throat: Uvula is midline, oropharynx is clear and moist and mucous membranes are normal.  Eyes: Conjunctivae, EOM and lids are normal. Pupils are equal, round, and reactive to light. Right eye exhibits no discharge. Left eye exhibits no discharge. No scleral icterus.  Neck: Normal range of motion. Neck supple.  Cardiovascular: Normal rate, regular rhythm, normal heart sounds, intact distal pulses and normal pulses.   Pulmonary/Chest: Effort normal and breath sounds normal. No respiratory distress. She has no wheezes. She has no rales. She exhibits tenderness.  Anterior chest wall tender to palpation.  Abdominal: Soft. Normal appearance and bowel sounds are normal. She exhibits no distension and no mass. There is  tenderness. There is no rigidity, no rebound, no guarding and no CVA tenderness.  RUQ TTP. No rebound, guarding, or masses.  Musculoskeletal: Normal range of motion. She exhibits tenderness. She exhibits no edema.  TTP of lumbar paraspinal muscles. No midline tenderness, step-off, or deformity.  Neurological: She is alert and oriented to person, place, and time. She has normal strength. No cranial nerve deficit or sensory deficit.  Skin: Skin is warm, dry and intact. No rash noted. She is not diaphoretic. No erythema. No pallor.  Psychiatric: She has a normal mood and affect. Her speech is normal and behavior is normal.  Nursing note and vitals reviewed.   ED Course  Procedures (including critical care time)  Labs Review Labs Reviewed  D-DIMER, QUANTITATIVE (NOT AT [facility])  Randolm Idol, ED    Imaging Review Dg Chest 2 View  04/01/2015  CLINICAL DATA:  Chest pain and back pain after lifting injury today. EXAM: CHEST  2 VIEW COMPARISON:  12/20/2014 FINDINGS: The heart size and mediastinal contours are within  normal limits. Both lungs are clear. The visualized skeletal structures are unremarkable. IMPRESSION: Normal chest. Electronically Signed   By: Lorriane Shire M.D.   On: 04/01/2015 14:54   US Abdomen Limited Ruq  04/01/2015  CLINICAL DATA:  40 year old female with right upper quadrant abdominal pain. EXAM: US ABDOMEN LIMITED - RIGHT UPPER QUADRANT COMPARISON:  No priors. FINDINGS: Gallbladder: No gallstones or wall thickening visualized. No sonographic Murphy sign noted by sonographer. Common bile duct: Diameter: 4.4 mm in the porta hepatis Liver: In the right lobe of the liver there is a well-defined generally hypoechoic lesion measuring approximately 3.8 x 3.6 x 4.3 cm, which is indeterminate. Within normal limits in parenchymal echogenicity. Normal hepatopetal flow in the portal vein. No intrahepatic biliary ductal dilatation. IMPRESSION: 1. No acute findings. Specifically, no gallstones and no evidence of acute cholecystitis at this time. 2. However, there is an indeterminate 3.8 x 3.6 x 4.3 cm lesion in the right lobe of the liver. Further characterization with nonemergent MRI of the abdomen with and without IV gadolinium is recommended in the near future to better evaluate this lesion. Electronically Signed   By: Vinnie Langton M.D.   On: 04/01/2015 21:46   I have personally reviewed and evaluated these images and lab results as part of my medical decision-making.   EKG Interpretation   Date/Time:  Thursday April 02 2015 06:10:32 EST Ventricular Rate:  77 PR Interval:  122 QRS Duration: 96 QT Interval:  372 QTC Calculation: 421 R Axis:   67 Text Interpretation:  Age not entered, assumed to be  40 years old for  purpose of ECG interpretation Sinus rhythm No significant change since  last tracing Confirmed by WARD,  DO, KRISTEN [phone]) on 04/02/2015 6:13:55  AM      MDM   Final diagnoses:  Back pain, unspecified location  Chest pain, unspecified chest pain type  Abdominal pain,  unspecified abdominal location    40 year old female presents with chest pain, abdominal pain, and back pain. Reports associated shortness of breath and nausea. Denies fever, chills, vomiting, diarrhea, constipation, dysuria, urgency, frequency, numbness, weakness, paresthesia. Patient evaluated in the ED last night for the same symptoms and discharged after an unremarkable work-up. At that time, she had negative troponins, a normal chest x-ray, and a RUQ [place] that revealed right liver lobe lesion. She states she returned to the ED due to worsening pain.  Patient is afebrile. Vital signs stable. Heart  RRR. Lungs clear bilaterally. Anterior chest wall tender to palpation, patient states this reproduces her chest pain. Abdomen soft, non-distended, with TTP in RUQ. No rebound, guarding, or masses. No CVA tenderness. TTP of lumbar paraspinal muscles. No midline tenderness, step-off, or deformity. Strength and sensation intact.  EKG sinus rhythm, HR 77. Troponin negative. Will obtain d-dimer. D-dimer negative.  Discussed findings with patient. Patient reports improvement of pain, though states her chest pain and back pain continue to bother her, especially with movement. Doubt ACS or PE given unremarkable work-up in the ED. Doubt cauda equina, abscess, hematoma. Symptoms likely muscular (chest pain and back pain reproducible on exam). Advised to continue taking prescribed pain medication and alternate with anti-inflammatory as well as muscle relaxant. Repeat abdominal exam benign, and given labs from previous visit are unremarkable (no leukocytosis, no electrolyte abnormality, no UTI) and RUQ [place] was negative for cholecystitis or cholelithiasis; do not feel additional imaging is indicated at this time.    Case management consulted regarding follow-up for patient. Patient has follow-up arranged with [facility]. Return precautions discussed. Patient verbalizes her understanding and is  in agreement with plan.   BP 119/88 mmHg  Pulse 72  Temp(Src) 97.8 F (36.6 C) (Oral)  Resp 16  SpO2 100%  LMP 03/29/2015    Marella Chimes, PA-C 04/02/15 Roscoe Ward, DO 04/03/15 0301

## 2015-04-28 ENCOUNTER — Ambulatory Visit: Payer: Self-pay | Admitting: Family Medicine

## 2015-05-25 ENCOUNTER — Ambulatory Visit: Payer: Self-pay | Admitting: Family Medicine

## 2015-06-23 ENCOUNTER — Encounter (HOSPITAL_COMMUNITY): Payer: Self-pay | Admitting: Emergency Medicine

## 2015-06-23 ENCOUNTER — Emergency Department (HOSPITAL_COMMUNITY)
Admission: EM | Admit: 2015-06-23 | Discharge: 2015-06-23 | Disposition: A | Discharge status: Home / Self Care | Payer: Self-pay | Attending: Emergency Medicine | Admitting: Emergency Medicine

## 2015-06-23 DIAGNOSIS — Z8739 Personal history of other diseases of the musculoskeletal system and connective tissue: Secondary | ICD-10-CM | POA: Insufficient documentation

## 2015-06-23 DIAGNOSIS — G43909 Migraine, unspecified, not intractable, without status migrainosus: Secondary | ICD-10-CM | POA: Insufficient documentation

## 2015-06-23 DIAGNOSIS — F419 Anxiety disorder, unspecified: Secondary | ICD-10-CM | POA: Insufficient documentation

## 2015-06-23 DIAGNOSIS — F17210 Nicotine dependence, cigarettes, uncomplicated: Secondary | ICD-10-CM | POA: Insufficient documentation

## 2015-06-23 DIAGNOSIS — Z3202 Encounter for pregnancy test, result negative: Secondary | ICD-10-CM | POA: Insufficient documentation

## 2015-06-23 DIAGNOSIS — K5900 Constipation, unspecified: Secondary | ICD-10-CM | POA: Insufficient documentation

## 2015-06-23 DIAGNOSIS — F329 Major depressive disorder, single episode, unspecified: Secondary | ICD-10-CM | POA: Insufficient documentation

## 2015-06-23 DIAGNOSIS — Z79899 Other long term (current) drug therapy: Secondary | ICD-10-CM | POA: Insufficient documentation

## 2015-06-23 DIAGNOSIS — Z7952 Long term (current) use of systemic steroids: Secondary | ICD-10-CM | POA: Insufficient documentation

## 2015-06-23 LAB — COMPREHENSIVE METABOLIC PANEL
ALT: 13 U/L — AB (ref 14–54)
ANION GAP: 11 (ref 5–15)
AST: 19 U/L (ref 15–41)
Albumin: 3.5 g/dL (ref 3.5–5.0)
Alkaline Phosphatase: 109 U/L (ref 38–126)
BUN: 13 mg/dL (ref 6–20)
CHLORIDE: 108 mmol/L (ref 101–111)
CO2: 22 mmol/L (ref 22–32)
CREATININE: 0.67 mg/dL (ref 0.44–1.00)
Calcium: 9.3 mg/dL (ref 8.9–10.3)
GFR calc non Af Amer: >60 mL/min (ref >60)
Glucose, Bld: 99 mg/dL (ref 65–99)
POTASSIUM: 4.3 mmol/L (ref 3.5–5.1)
SODIUM: 141 mmol/L (ref 135–145)
Total Bilirubin: 0.3 mg/dL (ref 0.3–1.2)
Total Protein: 7.1 g/dL (ref 6.5–8.1)

## 2015-06-23 LAB — URINE MICROSCOPIC-ADD ON

## 2015-06-23 LAB — URINALYSIS, ROUTINE W REFLEX MICROSCOPIC
Bilirubin Urine: NEGATIVE
GLUCOSE, UA: NEGATIVE mg/dL
HGB URINE DIPSTICK: NEGATIVE
Ketones, ur: NEGATIVE mg/dL
Nitrite: NEGATIVE
PH: 7 (ref 5.0–8.0)
Protein, ur: NEGATIVE mg/dL
SPECIFIC GRAVITY, URINE: 1.02 (ref 1.005–1.030)

## 2015-06-23 LAB — CBC
HEMATOCRIT: 34 % — AB (ref 36.0–46.0)
HEMOGLOBIN: 9.9 g/dL — AB (ref 12.0–15.0)
MCH: 23.1 pg — AB (ref 26.0–34.0)
MCHC: 29.1 g/dL — ABNORMAL LOW (ref 30.0–36.0)
MCV: 79.3 fL (ref 78.0–100.0)
Platelets: 289 10*3/uL (ref 150–400)
RBC: 4.29 MIL/uL (ref 3.87–5.11)
RDW: 19.1 % — ABNORMAL HIGH (ref 11.5–15.5)
WBC: 8.7 10*3/uL (ref 4.0–10.5)

## 2015-06-23 LAB — LIPASE, BLOOD: LIPASE: 25 U/L (ref 11–51)

## 2015-06-23 LAB — POC URINE PREG, ED: Preg Test, Ur: NEGATIVE

## 2015-06-23 MED ORDER — BISACODYL 10 MG RE SUPP
10.0000 mg | Freq: Once | RECTAL | Status: AC
  Administered 2015-06-23: 10 mg via RECTAL
  Filled 2015-06-23: qty 1

## 2015-06-23 MED ORDER — BISACODYL 5 MG PO TBEC: 5.0000 mg | DELAYED_RELEASE_TABLET | Freq: Two times a day (BID) | ORAL | Status: DC | PRN

## 2015-06-23 MED ORDER — LACTULOSE 10 GM/15ML PO SOLN
20.0000 g | Freq: Once | ORAL | Status: AC
  Administered 2015-06-23: 20 g via ORAL
  Filled 2015-06-23: qty 30

## 2015-06-23 NOTE — ED Notes (Signed)
Pt c/o constipation x 7 days.

## 2015-06-23 NOTE — ED Provider Notes (Signed)
CSN: [id number]     Arrival date & time 06/23/15  1251 History  By signing my name below, I, Eustaquio Maize, attest that this documentation has been prepared under the direction and in the presence of [facility], PA-C. Electronically Signed: Eustaquio Maize, ED Scribe. 06/23/2015. 2:54 PM.   Chief Complaint  Patient presents with  . Constipation   The history is provided by the patient. No language interpreter was used.     HPI Comments: Gina Shelton is a 40 y.o. female who presents to the Emergency Department complaining of constant, constipation x 8 days. Last normal bowel movement 06/15/2015 (approximately 9 days ago). Pt has been passing gas. She also complains of nausea and cramping abdominal pain immediately after eating. Pt has been taking Miralax for the past 3 days without relief. Pt does not take any narcotic medication at the moment. No new change in medication or change in diet. She does mention having a stomach bug and cold like symptoms last week for 3-4 days. Pt was taking Mucinex with some relief. She denies taking any Imodium last week for her symptoms. Denies vomiting, fever, chills, dysuria, vaginal discharge, vaginal bleeding, chest pain, cough, sore throat, or any other associated symptoms.   Past Medical History  Diagnosis Date  . Depression   . GERD (gastroesophageal reflux disease)   . Migraine   . Gout     ble  . Depression   . Migraine   . Anxiety    Past Surgical History  Procedure Laterality Date  . Cesarean section  infection at incission requiring return to OR x 2  . Tubal ligation     Family History  Problem Relation Age of Onset  . Anesthesia problems Neg Hx   . Hypotension Neg Hx   . Malignant hyperthermia Neg Hx   . Pseudochol deficiency Neg Hx   . Hypertension Mother   . Arthritis Sister    Social History  Substance Use Topics  . Smoking status: Current Every Day Smoker -- 0.00 packs/day for 0 years    Types: Cigarettes  . Smokeless  tobacco: Never Used     Comment: Currently using Nicotine patch  . Alcohol Use: 0.0 oz/week   OB History    Gravida Para Term Preterm AB TAB SAB Ectopic Multiple Living   3 3        3      Review of Systems  Constitutional: Negative for fever and chills.  HENT: Negative for sore throat.   Respiratory: Negative for cough.   Cardiovascular: Negative for chest pain.  Gastrointestinal: Positive for nausea, abdominal pain and constipation. Negative for vomiting.  Genitourinary: Negative for dysuria, vaginal bleeding and vaginal discharge.  Skin: Negative for rash.  Allergic/Immunologic: Negative for immunocompromised state.  Hematological: Does not bruise/bleed easily.  Psychiatric/Behavioral: Negative for self-injury.   Allergies  Review of patient's allergies indicates no known allergies.  Home Medications   Prior to Admission medications   Medication Sig Start Date End Date Taking? Authorizing Provider  albuterol (PROVENTIL HFA;VENTOLIN HFA) 108 (90 BASE) MCG/ACT inhaler Inhale 1-2 puffs into the lungs every 4 (four) hours as needed for wheezing or shortness of breath (cough). 11/21/13   Dahlia Bailiff, PA-C  albuterol (PROVENTIL HFA;VENTOLIN HFA) 108 (90 BASE) MCG/ACT inhaler Inhale 2 puffs into the lungs every 4 (four) hours as needed for wheezing or shortness of breath. 12/20/14   Shawn C Joy, PA-C  benzonatate (TESSALON) 100 MG capsule Take 1 capsule (100 mg total) by  mouth every 8 (eight) hours as needed for cough. 12/21/14   Shawn C Joy, PA-C  carbamazepine (TEGRETOL XR) 200 MG 12 hr tablet Take 1 tablet (200 mg total) by mouth 2 (two) times daily. Mood stabilization. 06/08/13   Patrecia Pour, NP  citalopram (CELEXA) 20 MG tablet Take 20 mg by mouth daily.    Historical Provider, MD  gabapentin (NEURONTIN) 100 MG capsule Take 1 capsule (100 mg total) by mouth 3 (three) times daily. Anxiety, neuropathic pain. 06/08/13   Patrecia Pour, NP  hydrOXYzine (ATARAX/VISTARIL) 10 MG tablet Take 1  tablet (10 mg total) by mouth every 6 (six) hours as needed for itching. Patient taking differently: Take 10 mg by mouth daily after lunch.  09/21/14   Montine Circle, PA-C  ibuprofen (ADVIL,MOTRIN) 200 MG tablet Take 400 mg by mouth every 6 (six) hours as needed for moderate pain.    Historical Provider, MD  medroxyPROGESTERone (PROVERA) 5 MG tablet Take 2 tablets (10 mg total) by mouth daily. 11/07/14   Forde Dandy, MD  methocarbamol (ROBAXIN) 500 MG tablet Take 1 tablet (500 mg total) by mouth 2 (two) times daily as needed for muscle spasms. 03/31/15   Waynetta Pean, PA-C  naproxen (NAPROSYN) 250 MG tablet Take 1 tablet (250 mg total) by mouth 2 (two) times daily with a meal. 03/31/15   Waynetta Pean, PA-C  ondansetron (ZOFRAN ODT) 4 MG disintegrating tablet Take 1 tablet (4 mg total) by mouth every 8 (eight) hours as needed for nausea. 04/02/15   Marella Chimes, PA-C  oxyCODONE (OXY IR/ROXICODONE) 5 MG immediate release tablet Take 1 tablet (5 mg total) by mouth once. Patient taking differently: Take 5 mg by mouth once. Daily 09/28/14   Tori Milks, MD  oxyCODONE (ROXICODONE) 5 MG immediate release tablet Take 1 tablet (5 mg total) by mouth every 4 (four) hours as needed for severe pain. 04/01/15   Tatyana Kirichenko, PA-C  oxyCODONE-acetaminophen (PERCOCET/ROXICET) 5-325 MG per tablet Take 1-2 tablets by mouth every 6 (six) hours as needed for severe pain. Patient not taking: Reported on 04/02/2015 05/29/14   Luvenia Redden, PA-C  predniSONE (DELTASONE) 10 MG tablet Take 4 tablets (40 mg total) by mouth daily. 12/20/14   Shawn C Joy, PA-C  risperiDONE (RISPERDAL) 1 MG tablet Take 1 tablet (1 mg total) by mouth at bedtime. 06/08/13   Patrecia Pour, NP  traZODone (DESYREL) 100 MG tablet Take 1 tablet (100 mg total) by mouth at bedtime as needed for sleep. Patient taking differently: Take 100 mg by mouth at bedtime.  06/08/13   Patrecia Pour, NP   BP 122/81 mmHg  Pulse 84  Temp(Src) 98.1 F  (36.7 C) (Oral)  Resp 17  Ht 5\' 2"  (1.575 m)  Wt 179 lb (81.194 kg)  BMI 32.73 kg/m2  SpO2 100%  LMP 06/14/2015   Physical Exam  Constitutional: She appears well-developed and well-nourished. No distress.  HENT:  Head: Normocephalic and atraumatic.  Neck: Neck supple.  Cardiovascular: Normal rate and regular rhythm.   Pulmonary/Chest: Effort normal and breath sounds normal. No respiratory distress. She has no wheezes. She has no rales.  Abdominal: Soft. She exhibits no distension. There is no tenderness. There is no rebound and no guarding.  Genitourinary:  Chaperone present. Normal anal tone. No hemorrhoids noted. No stool or blood in rectal vault.   Neurological: She is alert.  Skin: She is not diaphoretic.  Nursing note and vitals reviewed.   ED Course  Procedures (including critical care time)  DIAGNOSTIC STUDIES: Oxygen Saturation is 100% on RA, normal by my interpretation.    COORDINATION OF CARE: 2:49 PM-Discussed treatment plan which includes rectal exam with pt at bedside and pt agreed to plan.   Labs Review Labs Reviewed  COMPREHENSIVE METABOLIC PANEL - Abnormal; Notable for the following:    ALT 13 (*)    All other components within normal limits  CBC - Abnormal; Notable for the following:    Hemoglobin 9.9 (*)    HCT 34.0 (*)    MCH 23.1 (*)    MCHC 29.1 (*)    RDW 19.1 (*)    All other components within normal limits  URINALYSIS, ROUTINE W REFLEX MICROSCOPIC (NOT AT [facility]) - Abnormal; Notable for the following:    Leukocytes, UA SMALL (*)    All other components within normal limits  URINE MICROSCOPIC-ADD ON - Abnormal; Notable for the following:    Squamous Epithelial / LPF 6-30 (*)    Bacteria, UA FEW (*)    All other components within normal limits  LIPASE, BLOOD  POC URINE PREG, ED    Imaging Review No results found. I have personally reviewed and evaluated these lab results as part of my medical decision-making.   EKG Interpretation None       MDM   Final diagnoses:  None    Afebrile, nontoxic patient with c/o constipation.  No e/o bowel obstruction.  No stool impaction.  Pt denies significant narcotic use.  Abdominal exam benign.  Relief with laxatives.    D/C home with dulcolax, PCP follow up.  Discussed result, findings, treatment, and follow up  with patient.  Pt given return precautions.  Pt verbalizes understanding and agrees with plan.       I personally performed the services described in this documentation, which was scribed in my presence. The recorded information has been reviewed and is accurate.      Clayton Bibles, PA-C 07/03/15 [id number]  Wandra Arthurs, MD 07/06/15 1007

## 2015-06-23 NOTE — Discharge Instructions (Signed)
Read the information below.  Use the prescribed medication as directed.  Please discuss all new medications with your pharmacist.  You may return to the Emergency Department at any time for worsening condition or any new symptoms that concern you.    If you develop high fevers, worsening abdominal pain, uncontrolled vomiting, or are unable to tolerate fluids by mouth, return to the ER for a recheck.     Constipation, Adult Constipation is when a person has fewer than three bowel movements a week, has difficulty having a bowel movement, or has stools that are dry, hard, or larger than normal. As people grow older, constipation is more common. A low-fiber diet, not taking in enough fluids, and taking certain medicines may make constipation worse.  CAUSES   Certain medicines, such as antidepressants, pain medicine, iron supplements, antacids, and water pills.   Certain diseases, such as diabetes, irritable bowel syndrome (IBS), thyroid disease, or depression.   Not drinking enough water.   Not eating enough fiber-rich foods.   Stress or travel.   Lack of physical activity or exercise.   Ignoring the urge to have a bowel movement.   Using laxatives too much.  SIGNS AND SYMPTOMS   Having fewer than three bowel movements a week.   Straining to have a bowel movement.   Having stools that are hard, dry, or larger than normal.   Feeling full or bloated.   Pain in the lower abdomen.   Not feeling relief after having a bowel movement.  DIAGNOSIS  Your health care provider will take a medical history and perform a physical exam. Further testing may be done for severe constipation. Some tests may include:  A barium enema X-ray to examine your rectum, colon, and, sometimes, your small intestine.   A sigmoidoscopy to examine your lower colon.   A colonoscopy to examine your entire colon. TREATMENT  Treatment will depend on the severity of your constipation and what is  causing it. Some dietary treatments include drinking more fluids and eating more fiber-rich foods. Lifestyle treatments may include regular exercise. If these diet and lifestyle recommendations do not help, your health care provider may recommend taking over-the-counter laxative medicines to help you have bowel movements. Prescription medicines may be prescribed if over-the-counter medicines do not work.  HOME CARE INSTRUCTIONS   Eat foods that have a lot of fiber, such as fruits, vegetables, whole grains, and beans.  Limit foods high in fat and processed sugars, such as french fries, hamburgers, cookies, candies, and soda.   A fiber supplement may be added to your diet if you cannot get enough fiber from foods.   Drink enough fluids to keep your urine clear or pale yellow.   Exercise regularly or as directed by your health care provider.   Go to the restroom when you have the urge to go. Do not hold it.   Only take over-the-counter or prescription medicines as directed by your health care provider. Do not take other medicines for constipation without talking to your health care provider first.  [place] IF:   You have bright red blood in your stool.   Your constipation lasts for more than 4 days or gets worse.   You have abdominal or rectal pain.   You have thin, pencil-like stools.   You have unexplained weight loss. MAKE SURE YOU:   Understand these instructions.  Will watch your condition.  Will get help right away if you are not doing well  or get worse.   This information is not intended to replace advice given to you by your health care provider. Make sure you discuss any questions you have with your health care provider.   Document Released: 11/27/2003 Document Revised: 03/21/2014 Document Reviewed: 12/10/2012 Elsevier Interactive Patient Education 2016 Elsevier Inc.  High-Fiber Diet Fiber, also called dietary fiber, is a type of  carbohydrate found in fruits, vegetables, whole grains, and beans. A high-fiber diet can have many health benefits. Your health care provider may recommend a high-fiber diet to help:  Prevent constipation. Fiber can make your bowel movements more regular.  Lower your cholesterol.  Relieve hemorrhoids, uncomplicated diverticulosis, or irritable bowel syndrome.  Prevent overeating as part of a weight-loss plan.  Prevent heart disease, type 2 diabetes, and certain cancers. WHAT IS MY PLAN? The recommended daily intake of fiber includes:  38 grams for men under age 68.  58 grams for men over age 35.  80 grams for women under age 51.  26 grams for women over age 62. You can get the recommended daily intake of dietary fiber by eating a variety of fruits, vegetables, grains, and beans. Your health care provider may also recommend a fiber supplement if it is not possible to get enough fiber through your diet. WHAT DO I NEED TO KNOW ABOUT A HIGH-FIBER DIET?  Fiber supplements have not been widely studied for their effectiveness, so it is better to get fiber through food sources.  Always check the fiber content on thenutrition facts label of any prepackaged food. Look for foods that contain at least 5 grams of fiber per serving.  Ask your dietitian if you have questions about specific foods that are related to your condition, especially if those foods are not listed in the following section.  Increase your daily fiber consumption gradually. Increasing your intake of dietary fiber too quickly may cause bloating, cramping, or gas.  Drink plenty of water. Water helps you to digest fiber. WHAT FOODS CAN I EAT? Grains Whole-grain breads. Multigrain cereal. Oats and oatmeal. Brown rice. Barley. Bulgur wheat. [place]. Bran muffins. Popcorn. Rye wafer crackers. Vegetables Sweet potatoes. Spinach. Kale. Artichokes. Cabbage. Broccoli. Green peas. Carrots. Squash. Fruits Berries. Pears. Apples.  Oranges. Avocados. Prunes and raisins. Dried figs. Meats and Other Protein Sources [place], kidney, pinto, and soy beans. Split peas. Lentils. Nuts and seeds. Dairy Fiber-fortified yogurt. Beverages Fiber-fortified soy milk. Fiber-fortified orange juice. Other Fiber bars. The items listed above may not be a complete list of recommended foods or beverages. Contact your dietitian for more options. WHAT FOODS ARE NOT RECOMMENDED? Grains White bread. Pasta made with refined flour. White rice. Vegetables Fried potatoes. Canned vegetables. Well-cooked vegetables.  Fruits Fruit juice. Cooked, strained fruit. Meats and Other Protein Sources Fatty cuts of meat. Fried Sales executive or fried fish. Dairy Milk. Yogurt. Cream cheese. Sour cream. Beverages Soft drinks. Other Cakes and pastries. Butter and oils. The items listed above may not be a complete list of foods and beverages to avoid. Contact your dietitian for more information. WHAT ARE SOME TIPS FOR INCLUDING HIGH-FIBER FOODS IN MY DIET?  Eat a wide variety of high-fiber foods.  Make sure that half of all grains consumed each day are whole grains.  Replace breads and cereals made from refined flour or white flour with whole-grain breads and cereals.  Replace white rice with brown rice, bulgur wheat, or millet.  Start the day with a breakfast that is high in fiber, such as a cereal that contains  at least 5 grams of fiber per serving.  Use beans in place of meat in soups, salads, or pasta.  Eat high-fiber snacks, such as berries, raw vegetables, nuts, or popcorn.   This information is not intended to replace advice given to you by your health care provider. Make sure you discuss any questions you have with your health care provider.   Document Released: 02/28/2005 Document Revised: 03/21/2014 Document Reviewed: 08/13/2013 Elsevier Interactive Patient Education [employer].

## 2015-06-23 NOTE — ED Notes (Signed)
Patient able to ambulate independently  

## 2015-07-04 ENCOUNTER — Encounter (HOSPITAL_COMMUNITY): Payer: Self-pay | Admitting: *Deleted

## 2015-07-04 ENCOUNTER — Emergency Department (HOSPITAL_COMMUNITY)
Admission: EM | Admit: 2015-07-04 | Discharge: 2015-07-04 | Disposition: A | Discharge status: Home / Self Care | Payer: Self-pay | Attending: Emergency Medicine | Admitting: Emergency Medicine

## 2015-07-04 ENCOUNTER — Emergency Department (HOSPITAL_COMMUNITY): Payer: Self-pay

## 2015-07-04 DIAGNOSIS — Z8719 Personal history of other diseases of the digestive system: Secondary | ICD-10-CM | POA: Insufficient documentation

## 2015-07-04 DIAGNOSIS — F419 Anxiety disorder, unspecified: Secondary | ICD-10-CM | POA: Insufficient documentation

## 2015-07-04 DIAGNOSIS — S92911A Unspecified fracture of right toe(s), initial encounter for closed fracture: Secondary | ICD-10-CM

## 2015-07-04 DIAGNOSIS — W010XXA Fall on same level from slipping, tripping and stumbling without subsequent striking against object, initial encounter: Secondary | ICD-10-CM | POA: Insufficient documentation

## 2015-07-04 DIAGNOSIS — S9031XA Contusion of right foot, initial encounter: Secondary | ICD-10-CM | POA: Insufficient documentation

## 2015-07-04 DIAGNOSIS — G43909 Migraine, unspecified, not intractable, without status migrainosus: Secondary | ICD-10-CM | POA: Insufficient documentation

## 2015-07-04 DIAGNOSIS — Z8739 Personal history of other diseases of the musculoskeletal system and connective tissue: Secondary | ICD-10-CM | POA: Insufficient documentation

## 2015-07-04 DIAGNOSIS — Y9289 Other specified places as the place of occurrence of the external cause: Secondary | ICD-10-CM | POA: Insufficient documentation

## 2015-07-04 DIAGNOSIS — Z791 Long term (current) use of non-steroidal anti-inflammatories (NSAID): Secondary | ICD-10-CM | POA: Insufficient documentation

## 2015-07-04 DIAGNOSIS — F329 Major depressive disorder, single episode, unspecified: Secondary | ICD-10-CM | POA: Insufficient documentation

## 2015-07-04 DIAGNOSIS — S92511A Displaced fracture of proximal phalanx of right lesser toe(s), initial encounter for closed fracture: Secondary | ICD-10-CM | POA: Insufficient documentation

## 2015-07-04 DIAGNOSIS — Z79899 Other long term (current) drug therapy: Secondary | ICD-10-CM | POA: Insufficient documentation

## 2015-07-04 DIAGNOSIS — Y998 Other external cause status: Secondary | ICD-10-CM | POA: Insufficient documentation

## 2015-07-04 DIAGNOSIS — Y9301 Activity, walking, marching and hiking: Secondary | ICD-10-CM | POA: Insufficient documentation

## 2015-07-04 DIAGNOSIS — F17210 Nicotine dependence, cigarettes, uncomplicated: Secondary | ICD-10-CM | POA: Insufficient documentation

## 2015-07-04 DIAGNOSIS — R000 Tachycardia, unspecified: Secondary | ICD-10-CM | POA: Insufficient documentation

## 2015-07-04 MED ORDER — HYDROCODONE-ACETAMINOPHEN 5-325 MG PO TABS
1.0000 | ORAL_TABLET | Freq: Once | ORAL | Status: AC
  Administered 2015-07-04: 1 via ORAL
  Filled 2015-07-04: qty 1

## 2015-07-04 MED ORDER — HYDROCODONE-ACETAMINOPHEN 5-325 MG PO TABS: 1.0000 | ORAL_TABLET | Freq: Four times a day (QID) | ORAL | Status: DC | PRN

## 2015-07-04 NOTE — ED Notes (Signed)
Pt reports injuring right foot last night. Has pain and swelling to little toe.

## 2015-07-04 NOTE — ED Notes (Signed)
Patient verbalized understanding of discharge instructions and denies any further needs or questions at this time. VS stable. Patient assisted to ED entrance in wheelchair.   
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
## 2015-07-31 ENCOUNTER — Emergency Department (HOSPITAL_COMMUNITY): Payer: No Typology Code available for payment source

## 2015-07-31 ENCOUNTER — Emergency Department (HOSPITAL_COMMUNITY)
Admission: EM | Admit: 2015-07-31 | Discharge: 2015-07-31 | Disposition: A | Discharge status: Home / Self Care | Payer: No Typology Code available for payment source | Attending: Emergency Medicine | Admitting: Emergency Medicine

## 2015-07-31 ENCOUNTER — Encounter (HOSPITAL_COMMUNITY): Payer: Self-pay | Admitting: Emergency Medicine

## 2015-07-31 DIAGNOSIS — R000 Tachycardia, unspecified: Secondary | ICD-10-CM | POA: Diagnosis not present

## 2015-07-31 DIAGNOSIS — F1110 Opioid abuse, uncomplicated: Secondary | ICD-10-CM | POA: Insufficient documentation

## 2015-07-31 DIAGNOSIS — S20211A Contusion of right front wall of thorax, initial encounter: Secondary | ICD-10-CM | POA: Diagnosis not present

## 2015-07-31 DIAGNOSIS — S50811A Abrasion of right forearm, initial encounter: Secondary | ICD-10-CM | POA: Insufficient documentation

## 2015-07-31 DIAGNOSIS — Y9389 Activity, other specified: Secondary | ICD-10-CM | POA: Insufficient documentation

## 2015-07-31 DIAGNOSIS — Y92410 Unspecified street and highway as the place of occurrence of the external cause: Secondary | ICD-10-CM | POA: Diagnosis not present

## 2015-07-31 DIAGNOSIS — Z3202 Encounter for pregnancy test, result negative: Secondary | ICD-10-CM | POA: Diagnosis not present

## 2015-07-31 DIAGNOSIS — R7989 Other specified abnormal findings of blood chemistry: Secondary | ICD-10-CM | POA: Insufficient documentation

## 2015-07-31 DIAGNOSIS — Y998 Other external cause status: Secondary | ICD-10-CM | POA: Insufficient documentation

## 2015-07-31 DIAGNOSIS — T07XXXA Unspecified multiple injuries, initial encounter: Secondary | ICD-10-CM

## 2015-07-31 DIAGNOSIS — S299XXA Unspecified injury of thorax, initial encounter: Secondary | ICD-10-CM | POA: Diagnosis present

## 2015-07-31 DIAGNOSIS — S6991XA Unspecified injury of right wrist, hand and finger(s), initial encounter: Secondary | ICD-10-CM | POA: Diagnosis not present

## 2015-07-31 LAB — RAPID URINE DRUG SCREEN, HOSP PERFORMED
AMPHETAMINES: NOT DETECTED
BENZODIAZEPINES: NOT DETECTED
Barbiturates: NOT DETECTED
Cocaine: NOT DETECTED
OPIATES: POSITIVE — AB
Tetrahydrocannabinol: NOT DETECTED

## 2015-07-31 LAB — I-STAT CG4 LACTIC ACID, ED
Lactic Acid, Venous: 5.48 mmol/L (ref 0.5–2.0)
Lactic Acid, Venous: 1.37 mmol/L (ref 0.5–2.0)

## 2015-07-31 LAB — CBC WITH DIFFERENTIAL/PLATELET
BASOS ABS: 0 10*3/uL (ref 0.0–0.1)
BASOS PCT: 0 %
EOS PCT: 2 %
Eosinophils Absolute: 0.2 10*3/uL (ref 0.0–0.7)
HEMATOCRIT: 36.1 % (ref 36.0–46.0)
Hemoglobin: 10.4 g/dL — ABNORMAL LOW (ref 12.0–15.0)
Lymphocytes Relative: 32 %
Lymphs Abs: 3.3 10*3/uL (ref 0.7–4.0)
MCH: 23.6 pg — ABNORMAL LOW (ref 26.0–34.0)
MCHC: 28.8 g/dL — ABNORMAL LOW (ref 30.0–36.0)
MCV: 82 fL (ref 78.0–100.0)
MONO ABS: 0.9 10*3/uL (ref 0.1–1.0)
Monocytes Relative: 9 %
NEUTROS ABS: 6 10*3/uL (ref 1.7–7.7)
Neutrophils Relative %: 57 %
Platelets: 283 10*3/uL (ref 150–400)
RBC: 4.4 MIL/uL (ref 3.87–5.11)
RDW: 17.1 % — ABNORMAL HIGH (ref 11.5–15.5)
WBC: 10.4 10*3/uL (ref 4.0–10.5)

## 2015-07-31 LAB — COMPREHENSIVE METABOLIC PANEL
ALK PHOS: 108 U/L (ref 38–126)
ALT: 15 U/L (ref 14–54)
ANION GAP: 16 — AB (ref 5–15)
AST: 22 U/L (ref 15–41)
Albumin: 3.6 g/dL (ref 3.5–5.0)
BILIRUBIN TOTAL: 0.3 mg/dL (ref 0.3–1.2)
BUN: 9 mg/dL (ref 6–20)
CHLORIDE: 109 mmol/L (ref 101–111)
CO2: 16 mmol/L — ABNORMAL LOW (ref 22–32)
Calcium: 8.9 mg/dL (ref 8.9–10.3)
Creatinine, Ser: 0.67 mg/dL (ref 0.44–1.00)
GFR calc Af Amer: >60 mL/min (ref >60)
GFR calc non Af Amer: >60 mL/min (ref >60)
GLUCOSE: 97 mg/dL (ref 65–99)
POTASSIUM: 3.6 mmol/L (ref 3.5–5.1)
Sodium: 141 mmol/L (ref 135–145)
TOTAL PROTEIN: 7.4 g/dL (ref 6.5–8.1)

## 2015-07-31 LAB — I-STAT BETA HCG BLOOD, ED (MC, WL, AP ONLY)

## 2015-07-31 LAB — LIPASE, BLOOD: LIPASE: 52 U/L — AB (ref 11–51)

## 2015-07-31 LAB — ETHANOL: Alcohol, Ethyl (B): 152 mg/dL — ABNORMAL HIGH (ref <5)

## 2015-07-31 MED ORDER — SODIUM CHLORIDE 0.9 % IV SOLN
1000.0000 mL | INTRAVENOUS | Status: DC
  Administered 2015-07-31: 1000 mL via INTRAVENOUS

## 2015-07-31 MED ORDER — SODIUM CHLORIDE 0.9 % IV SOLN
1000.0000 mL | Freq: Once | INTRAVENOUS | Status: AC
  Administered 2015-07-31: 1000 mL via INTRAVENOUS

## 2015-07-31 MED ORDER — TETANUS-DIPHTH-ACELL PERTUSSIS 5-2.5-18.5 LF-MCG/0.5 IM SUSP
0.5000 mL | Freq: Once | INTRAMUSCULAR | Status: AC
  Administered 2015-07-31: 0.5 mL via INTRAMUSCULAR
  Filled 2015-07-31: qty 0.5

## 2015-07-31 MED ORDER — OXYCODONE-ACETAMINOPHEN 5-325 MG PO TABS
2.0000 | ORAL_TABLET | Freq: Once | ORAL | Status: AC
  Administered 2015-07-31: 2 via ORAL
  Filled 2015-07-31: qty 2

## 2015-07-31 MED ORDER — MORPHINE SULFATE (PF) 4 MG/ML IV SOLN: 4.0000 mg | Freq: Once | INTRAVENOUS | Status: DC

## 2015-07-31 MED ORDER — MORPHINE SULFATE 2 MG/ML IJ SOLN
INTRAMUSCULAR | Status: DC | PRN
  Administered 2015-07-31: 4 mg via INTRAVENOUS

## 2015-07-31 MED ORDER — IOPAMIDOL (ISOVUE-300) INJECTION 61%
INTRAVENOUS | Status: AC
  Administered 2015-07-31: 100 mL via INTRAVENOUS
  Filled 2015-07-31: qty 100

## 2015-07-31 MED ORDER — MORPHINE SULFATE (PF) 2 MG/ML IV SOLN
2.0000 mg | Freq: Once | INTRAVENOUS | Status: AC
  Administered 2015-07-31: 2 mg via INTRAVENOUS
  Filled 2015-07-31: qty 1

## 2015-07-31 MED ORDER — ONDANSETRON HCL 4 MG/2ML IJ SOLN
4.0000 mg | Freq: Once | INTRAMUSCULAR | Status: AC
  Administered 2015-07-31: 4 mg via INTRAVENOUS

## 2015-07-31 MED ORDER — MORPHINE SULFATE (PF) 4 MG/ML IV SOLN
4.0000 mg | Freq: Once | INTRAVENOUS | Status: AC
  Administered 2015-07-31: 4 mg via INTRAVENOUS
  Filled 2015-07-31: qty 1

## 2015-07-31 MED ORDER — OXYCODONE-ACETAMINOPHEN 5-325 MG PO TABS: 1.0000 | ORAL_TABLET | ORAL | Status: DC | PRN

## 2015-07-31 NOTE — ED Notes (Signed)
[employer] at bedside 

## 2015-07-31 NOTE — ED Notes (Signed)
Pt to Ct

## 2015-07-31 NOTE — ED Notes (Signed)
Patient ambulatory with this RN with standby assistance.  Patient was stable and steady.  Patient was able to walk from Room 36 to the bathroom in front of Room 31.
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
## 2015-08-03 ENCOUNTER — Encounter (HOSPITAL_COMMUNITY): Payer: Self-pay | Admitting: *Deleted

## 2015-08-11 ENCOUNTER — Emergency Department (HOSPITAL_COMMUNITY)
Admission: EM | Admit: 2015-08-11 | Discharge: 2015-08-12 | Disposition: A | Discharge status: Home / Self Care | Payer: No Typology Code available for payment source | Attending: Emergency Medicine | Admitting: Emergency Medicine

## 2015-08-11 ENCOUNTER — Encounter (HOSPITAL_COMMUNITY): Payer: Self-pay | Admitting: Emergency Medicine

## 2015-08-11 DIAGNOSIS — M791 Myalgia, unspecified site: Secondary | ICD-10-CM

## 2015-08-11 DIAGNOSIS — Z79899 Other long term (current) drug therapy: Secondary | ICD-10-CM | POA: Diagnosis not present

## 2015-08-11 DIAGNOSIS — Y939 Activity, unspecified: Secondary | ICD-10-CM | POA: Insufficient documentation

## 2015-08-11 DIAGNOSIS — F17210 Nicotine dependence, cigarettes, uncomplicated: Secondary | ICD-10-CM | POA: Insufficient documentation

## 2015-08-11 DIAGNOSIS — M549 Dorsalgia, unspecified: Secondary | ICD-10-CM | POA: Diagnosis present

## 2015-08-11 DIAGNOSIS — Z791 Long term (current) use of non-steroidal anti-inflammatories (NSAID): Secondary | ICD-10-CM | POA: Diagnosis not present

## 2015-08-11 DIAGNOSIS — Z79891 Long term (current) use of opiate analgesic: Secondary | ICD-10-CM | POA: Insufficient documentation

## 2015-08-11 DIAGNOSIS — Y92410 Unspecified street and highway as the place of occurrence of the external cause: Secondary | ICD-10-CM | POA: Insufficient documentation

## 2015-08-11 DIAGNOSIS — Y999 Unspecified external cause status: Secondary | ICD-10-CM | POA: Insufficient documentation

## 2015-08-11 MED ORDER — METHOCARBAMOL 500 MG PO TABS: 500.0000 mg | ORAL_TABLET | Freq: Two times a day (BID) | ORAL | Status: DC | PRN

## 2015-08-11 MED ORDER — HYDROCODONE-ACETAMINOPHEN 5-325 MG PO TABS
1.0000 | ORAL_TABLET | Freq: Once | ORAL | Status: AC
  Administered 2015-08-11: 1 via ORAL
  Filled 2015-08-11: qty 1

## 2015-08-11 MED ORDER — METHOCARBAMOL 500 MG PO TABS
500.0000 mg | ORAL_TABLET | Freq: Once | ORAL | Status: AC
  Administered 2015-08-11: 500 mg via ORAL
  Filled 2015-08-11: qty 1

## 2015-08-11 MED ORDER — OXYCODONE-ACETAMINOPHEN 5-325 MG PO TABS: 1.0000 | ORAL_TABLET | Freq: Four times a day (QID) | ORAL | Status: DC | PRN

## 2015-08-11 NOTE — ED Notes (Signed)
Pt c/o pain with urination and BM x 2 days
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
## 2015-08-11 NOTE — Discharge Instructions (Signed)
Take Robaxin and pain medication only as needed for severe pain - This can make you very drowsy - please do not drink alcohol, operate heavy machinery or drive on this medication.  Ibuprofen as needed for mild to moderate pain. Ice affected area for additional pain relief.  Follow up with your primary care provider if symptoms persist longer than 1 week.  Return to ER for new or worsening symptoms, any additional concerns.    COLD THERAPY DIRECTIONS:  Ice or gel packs can be used to reduce both pain and swelling. Ice is the most helpful within the first 24 to 48 hours after an injury or flareup from overusing a muscle or joint.  Ice is effective, has very few side effects, and is safe for most people to use.   If you expose your skin to cold temperatures for too long or without the proper protection, you can damage your skin or nerves. Watch for signs of skin damage due to cold.   HOME CARE INSTRUCTIONS  Follow these tips to use ice and cold packs safely.  Place a dry or damp towel between the ice and skin. A damp towel will cool the skin more quickly, so you may need to shorten the time that the ice is used.  For a more rapid response, add gentle compression to the ice.  Ice for no more than 10 to 20 minutes at a time. The bonier the area you are icing, the less time it will take to get the benefits of ice.  Check your skin after 5 minutes to make sure there are no signs of a poor response to cold or skin damage.  Rest 20 minutes or more in between uses.  Once your skin is numb, you can end your treatment. You can test numbness by very lightly touching your skin. The touch should be so light that you do not see the skin dimple from the pressure of your fingertip. When using ice, most people will feel these normal sensations in this order: cold, burning, aching, and numbness.

## 2015-09-23 ENCOUNTER — Emergency Department (HOSPITAL_COMMUNITY): Payer: Self-pay

## 2015-09-23 ENCOUNTER — Emergency Department (HOSPITAL_COMMUNITY)
Admission: EM | Admit: 2015-09-23 | Discharge: 2015-09-23 | Disposition: A | Discharge status: Home / Self Care | Payer: Self-pay | Attending: Emergency Medicine | Admitting: Emergency Medicine

## 2015-09-23 ENCOUNTER — Encounter (HOSPITAL_COMMUNITY): Payer: Self-pay | Admitting: Emergency Medicine

## 2015-09-23 DIAGNOSIS — R091 Pleurisy: Secondary | ICD-10-CM | POA: Insufficient documentation

## 2015-09-23 DIAGNOSIS — F17210 Nicotine dependence, cigarettes, uncomplicated: Secondary | ICD-10-CM | POA: Insufficient documentation

## 2015-09-23 DIAGNOSIS — R0789 Other chest pain: Secondary | ICD-10-CM

## 2015-09-23 LAB — URINE MICROSCOPIC-ADD ON: BACTERIA UA: NONE SEEN

## 2015-09-23 LAB — URINALYSIS, ROUTINE W REFLEX MICROSCOPIC
Bilirubin Urine: NEGATIVE
Glucose, UA: NEGATIVE mg/dL
HGB URINE DIPSTICK: NEGATIVE
Ketones, ur: NEGATIVE mg/dL
NITRITE: NEGATIVE
PROTEIN: NEGATIVE mg/dL
SPECIFIC GRAVITY, URINE: 1.03 (ref 1.005–1.030)
pH: 6 (ref 5.0–8.0)

## 2015-09-23 MED ORDER — CYCLOBENZAPRINE HCL 5 MG PO TABS: 5.0000 mg | ORAL_TABLET | Freq: Three times a day (TID) | ORAL | Status: DC | PRN

## 2015-09-23 MED ORDER — KETOROLAC TROMETHAMINE 60 MG/2ML IM SOLN
60.0000 mg | Freq: Once | INTRAMUSCULAR | Status: AC
  Administered 2015-09-23: 60 mg via INTRAMUSCULAR
  Filled 2015-09-23: qty 2

## 2015-09-23 MED ORDER — ALBUTEROL SULFATE HFA 108 (90 BASE) MCG/ACT IN AERS: 2.0000 | INHALATION_SPRAY | RESPIRATORY_TRACT | Status: DC | PRN

## 2015-09-23 MED ORDER — NAPROXEN 500 MG PO TABS: ORAL_TABLET | ORAL | Status: DC

## 2015-09-23 MED ORDER — CYCLOBENZAPRINE HCL 10 MG PO TABS
5.0000 mg | ORAL_TABLET | Freq: Once | ORAL | Status: AC
  Administered 2015-09-23: 5 mg via ORAL
  Filled 2015-09-23: qty 1

## 2015-09-23 NOTE — ED Notes (Addendum)
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 Has also has had a cold and cough x 2 months, hurts to cough and move having upper  Rt abd pain and lower chest from that
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
## 2015-09-23 NOTE — ED Notes (Signed)
To x-ray

## 2015-09-23 NOTE — Discharge Instructions (Signed)
Chest Wall Pain °Chest wall pain is pain in or around the bones and muscles of your chest. Sometimes, an injury causes this pain. Sometimes, the cause may not be known. This pain may take several weeks or longer to get better. °HOME CARE °Pay attention to any changes in your symptoms. Take these actions to help with your pain: °· Rest as told by your doctor. °· Avoid activities that cause pain. Try not to use your chest, belly (abdominal), or side muscles to lift heavy things. °· If directed, apply ice to the painful area: °¨ Put ice in a plastic bag. °¨ Place a towel between your skin and the bag. °¨ Leave the ice on for 20 minutes, 2-3 times per day. °· Take over-the-counter and prescription medicines only as told by your doctor. °· Do not use tobacco products, including cigarettes, chewing tobacco, and e-cigarettes. If you need help quitting, ask your doctor. °· Keep all follow-up visits as told by your doctor. This is important. °GET HELP IF: °· You have a fever. °· Your chest pain gets worse. °· You have new symptoms. °GET HELP RIGHT AWAY IF: °· You feel sick to your stomach (nauseous) or you throw up (vomit). °· You feel sweaty or light-headed. °· You have a cough with phlegm (sputum) or you cough up blood. °· You are short of breath. °  °This information is not intended to replace advice given to you by your health care provider. Make sure you discuss any questions you have with your health care provider. °  °Document Released: 08/17/2007 Document Revised: 11/19/2014 Document Reviewed: 05/26/2014 °Elsevier Interactive Patient Education ©2016 [employer] ° °

## 2015-09-24 LAB — URINE CULTURE

## 2015-10-29 ENCOUNTER — Emergency Department (HOSPITAL_COMMUNITY): Payer: Self-pay

## 2015-10-29 ENCOUNTER — Encounter (HOSPITAL_COMMUNITY): Payer: Self-pay

## 2015-10-29 DIAGNOSIS — Z79899 Other long term (current) drug therapy: Secondary | ICD-10-CM | POA: Insufficient documentation

## 2015-10-29 DIAGNOSIS — R791 Abnormal coagulation profile: Secondary | ICD-10-CM | POA: Insufficient documentation

## 2015-10-29 DIAGNOSIS — G43909 Migraine, unspecified, not intractable, without status migrainosus: Secondary | ICD-10-CM | POA: Insufficient documentation

## 2015-10-29 DIAGNOSIS — F17210 Nicotine dependence, cigarettes, uncomplicated: Secondary | ICD-10-CM | POA: Insufficient documentation

## 2015-10-29 LAB — DIFFERENTIAL
BASOS ABS: 0 10*3/uL (ref 0.0–0.1)
BASOS PCT: 0 %
EOS ABS: 0.2 10*3/uL (ref 0.0–0.7)
EOS PCT: 3 %
LYMPHS ABS: 2.6 10*3/uL (ref 0.7–4.0)
Lymphocytes Relative: 37 %
MONOS PCT: 7 %
Monocytes Absolute: 0.5 10*3/uL (ref 0.1–1.0)
Neutro Abs: 3.8 10*3/uL (ref 1.7–7.7)
Neutrophils Relative %: 53 %

## 2015-10-29 LAB — COMPREHENSIVE METABOLIC PANEL
ALT: 20 U/L (ref 14–54)
ANION GAP: 5 (ref 5–15)
AST: 21 U/L (ref 15–41)
Albumin: 3.8 g/dL (ref 3.5–5.0)
Alkaline Phosphatase: 120 U/L (ref 38–126)
BILIRUBIN TOTAL: 0.4 mg/dL (ref 0.3–1.2)
BUN: 8 mg/dL (ref 6–20)
CHLORIDE: 111 mmol/L (ref 101–111)
CO2: 24 mmol/L (ref 22–32)
CREATININE: 0.62 mg/dL (ref 0.44–1.00)
Calcium: 9.3 mg/dL (ref 8.9–10.3)
GLUCOSE: 116 mg/dL — AB (ref 65–99)
Potassium: 3.6 mmol/L (ref 3.5–5.1)
Sodium: 140 mmol/L (ref 135–145)
Total Protein: 6.6 g/dL (ref 6.5–8.1)

## 2015-10-29 LAB — CBC
HEMATOCRIT: 34.9 % — AB (ref 36.0–46.0)
Hemoglobin: 10.8 g/dL — ABNORMAL LOW (ref 12.0–15.0)
MCH: 27.3 pg (ref 26.0–34.0)
MCHC: 30.9 g/dL (ref 30.0–36.0)
MCV: 88.4 fL (ref 78.0–100.0)
Platelets: 295 10*3/uL (ref 150–400)
RBC: 3.95 MIL/uL (ref 3.87–5.11)
RDW: 19.3 % — AB (ref 11.5–15.5)
WBC: 7 10*3/uL (ref 4.0–10.5)

## 2015-10-29 LAB — I-STAT CHEM 8, ED
BUN: 8 mg/dL (ref 6–20)
CHLORIDE: 107 mmol/L (ref 101–111)
Calcium, Ion: 1.22 mmol/L (ref 1.13–1.30)
Creatinine, Ser: 0.6 mg/dL (ref 0.44–1.00)
Glucose, Bld: 114 mg/dL — ABNORMAL HIGH (ref 65–99)
HCT: 36 % (ref 36.0–46.0)
Hemoglobin: 12.2 g/dL (ref 12.0–15.0)
POTASSIUM: 3.6 mmol/L (ref 3.5–5.1)
SODIUM: 143 mmol/L (ref 135–145)
TCO2: 24 mmol/L (ref 0–100)

## 2015-10-29 LAB — PROTIME-INR
INR: 0.96
Prothrombin Time: 12.8 seconds (ref 11.4–15.2)

## 2015-10-29 LAB — APTT: APTT: 29 s (ref 24–36)

## 2015-10-29 LAB — I-STAT TROPONIN, ED: Troponin i, poc: 0 ng/mL (ref 0.00–0.08)

## 2015-10-29 NOTE — ED Triage Notes (Signed)
Pt c/o migraine headache with nausea and dizziness that started yesterday. Hx of migraines but has not had on in a long time. States dizziness feels like "pass out dizzy. "

## 2015-10-30 ENCOUNTER — Emergency Department (HOSPITAL_COMMUNITY)
Admission: EM | Admit: 2015-10-30 | Discharge: 2015-10-30 | Disposition: A | Discharge status: Home / Self Care | Payer: Self-pay | Attending: Emergency Medicine | Admitting: Emergency Medicine

## 2015-10-30 DIAGNOSIS — G43009 Migraine without aura, not intractable, without status migrainosus: Secondary | ICD-10-CM

## 2015-10-30 LAB — CBG MONITORING, ED: Glucose-Capillary: 130 mg/dL — ABNORMAL HIGH (ref 65–99)

## 2015-10-30 MED ORDER — METOCLOPRAMIDE HCL 5 MG/ML IJ SOLN
10.0000 mg | Freq: Once | INTRAMUSCULAR | Status: AC
  Administered 2015-10-30: 10 mg via INTRAVENOUS
  Filled 2015-10-30: qty 2

## 2015-10-30 MED ORDER — DIPHENHYDRAMINE HCL 50 MG/ML IJ SOLN
12.5000 mg | Freq: Once | INTRAMUSCULAR | Status: AC
  Administered 2015-10-30: 12.5 mg via INTRAVENOUS
  Filled 2015-10-30: qty 1

## 2015-10-30 MED ORDER — KETOROLAC TROMETHAMINE 15 MG/ML IJ SOLN
15.0000 mg | Freq: Once | INTRAMUSCULAR | Status: AC
  Administered 2015-10-30: 15 mg via INTRAVENOUS
  Filled 2015-10-30: qty 1

## 2015-10-30 NOTE — ED Notes (Signed)
Hoyle Sauer, Resident at bedside

## 2015-10-30 NOTE — ED Notes (Signed)
Pt ambulatory to room A13 from lobby with RN and steady gait noted

## 2015-10-30 NOTE — ED Provider Notes (Signed)
[facility] DEPT Provider Note   CSN: [id number] Arrival date & time: 10/29/15  1921     History   Chief Complaint Chief Complaint  Patient presents with  . Migraine  . Dizziness    HPI Gina Shelton is a 40 y.o. female.  Patient presents today with gradual onset headache that has worsened over the past 2 days. Headache is left sided and feels like a pressure across her forehead. She has a history of migraines and was previously on Imitrex, however has not had a migraine in over a year. 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 She said she has only had 2 days off this month and reports poor sleep. She endorses dizziness, nausea, blurry vision, and photophobia. Denies fevers. No recent tick bites. No difficulty ambulating. No facial droop or slurred speech. No weakness.       Past Medical History:  Diagnosis Date  . Anxiety   . Depression   . Depression   . GERD (gastroesophageal reflux disease)   . Gout    ble  . Migraine   . Migraine     Patient Active Problem List   Diagnosis Date Noted  . Alcohol dependence ([place]) 06/07/2013  . Cocaine abuse 06/07/2013  . DEPRESSION, MAJOR, RECURRENT 08/17/2007  . ANXIETY STATE NOS 11/15/2006    Past Surgical History:  Procedure Laterality Date  . CESAREAN SECTION  infection at incission requiring return to OR x 2  . TUBAL LIGATION      OB History    Gravida Para Term Preterm AB Living   3 3 0 0 0     SAB TAB Ectopic Multiple Live Births   0 0 0           Home Medications    Prior to Admission medications   Medication Sig Start Date End Date Taking? Authorizing Provider  albuterol (PROVENTIL HFA;VENTOLIN HFA) 108 (90 Base) MCG/ACT inhaler Inhale 2 puffs into the lungs every 4 (four) hours as needed for wheezing or shortness of breath. 09/23/15   Hillary Corinda Gubler, MD  bisacodyl (DULCOLAX) 5 MG EC tablet Take 1 tablet (5 mg total) by mouth 2 (two)  times daily as needed (constipation). 06/23/15   Clayton Bibles, PA-C  carbamazepine (TEGRETOL XR) 200 MG 12 hr tablet Take 1 tablet (200 mg total) by mouth 2 (two) times daily. Mood stabilization. 06/08/13   Patrecia Pour, NP  citalopram (CELEXA) 20 MG tablet Take 20 mg by mouth daily.    Historical Provider, MD  cyclobenzaprine (FLEXERIL) 5 MG tablet Take 1 tablet (5 mg total) by mouth 3 (three) times daily as needed for muscle spasms. 09/23/15   Hillary Corinda Gubler, MD  gabapentin (NEURONTIN) 100 MG capsule Take 1 capsule (100 mg total) by mouth 3 (three) times daily. Anxiety, neuropathic pain. 06/08/13   Patrecia Pour, NP  hydrOXYzine (ATARAX/VISTARIL) 10 MG tablet Take 1 tablet (10 mg total) by mouth every 6 (six) hours as needed for itching. Patient taking differently: Take 10 mg by mouth daily after lunch.  09/21/14   Montine Circle, PA-C  ibuprofen (ADVIL,MOTRIN) 200 MG tablet Take 400 mg by mouth every 6 (six) hours as needed for moderate pain.    Historical Provider, MD  naproxen (NAPROSYN) 500 MG tablet Take as needed twice daily with food. 09/23/15   Hillary Corinda Gubler, MD  risperiDONE (RISPERDAL) 1 MG tablet Take 1 tablet (  1 mg total) by mouth at bedtime. 06/08/13   Patrecia Pour, NP  traZODone (DESYREL) 100 MG tablet Take 1 tablet (100 mg total) by mouth at bedtime as needed for sleep. Patient taking differently: Take 100 mg by mouth at bedtime.  06/08/13   Patrecia Pour, NP    Family History Family History  Problem Relation Age of Onset  . Hypertension Mother   . Arthritis Sister   . Anesthesia problems Neg Hx   . Hypotension Neg Hx   . Malignant hyperthermia Neg Hx   . Pseudochol deficiency Neg Hx     Social History Social History  Substance Use Topics  . Smoking status: Current Every Day Smoker    Packs/day: 0.00    Years: 0.00    Types: Cigarettes  . Smokeless tobacco: Not on file     Comment: Currently using Nicotine patch  . Alcohol use 0.0 oz/week      Allergies   Review of patient's allergies indicates no known allergies.   Review of Systems Review of Systems  Constitutional: Negative.   Eyes: Positive for photophobia.  Respiratory: Negative.   Cardiovascular: Negative.   Gastrointestinal: Positive for nausea.  Endocrine: Negative.   Genitourinary: Negative.   Musculoskeletal: Negative.   Allergic/Immunologic: Negative.   Neurological: Positive for dizziness and headaches.  Hematological: Negative.   Psychiatric/Behavioral: Negative.   All other systems reviewed and are negative.    Physical Exam Updated Vital Signs BP 117/89   Pulse 72   Temp 98.1 F (36.7 C) (Oral)   Resp 18   Ht 5\' 2"  (1.575 m)   Wt 85.7 kg   SpO2 97%   BMI 34.57 kg/m   Physical Exam  Constitutional: She is oriented to person, place, and time. She appears well-developed and well-nourished. No distress.  HENT:  Head: Normocephalic and atraumatic.  Eyes: Conjunctivae are normal.  Neck: Neck supple.  Cardiovascular: Normal rate and regular rhythm.   No murmur heard. Pulmonary/Chest: Effort normal and breath sounds normal. No respiratory distress.  Abdominal: Soft. There is no tenderness.  Musculoskeletal: She exhibits no edema.  Neurological: She is alert and oriented to person, place, and time. No cranial nerve deficit. Coordination normal.  Normal gait   Skin: Skin is warm and dry.  Psychiatric: She has a normal mood and affect.  Nursing note and vitals reviewed.    ED Treatments / Results  Labs (all labs ordered are listed, but only abnormal results are displayed) Labs Reviewed  CBC - Abnormal; Notable for the following:       Result Value   Hemoglobin 10.8 (*)    HCT 34.9 (*)    RDW 19.3 (*)    All other components within normal limits  COMPREHENSIVE METABOLIC PANEL - Abnormal; Notable for the following:    Glucose, Bld 116 (*)    All other components within normal limits  I-STAT CHEM 8, ED - Abnormal; Notable for the  following:    Glucose, Bld 114 (*)    All other components within normal limits  PROTIME-INR  APTT  DIFFERENTIAL  I-STAT TROPOININ, ED  CBG MONITORING, ED    EKG  EKG Interpretation  Date/Time:  Thursday October 29 2015 19:28:12 EDT Ventricular Rate:  100 PR Interval:  126 QRS Duration: 88 QT Interval:  348 QTC Calculation: 448 R Axis:   41 Text Interpretation:  Normal sinus rhythm Nonspecific T wave abnormality Abnormal ECG Confirmed by Christy Gentles  MD, DONALD ([id number]) on 10/30/2015 3:04:30 AM  Radiology Ct Head Wo Contrast  Result Date: 10/29/2015 CLINICAL DATA:  Initial evaluation for acute dizziness. EXAM: CT HEAD WITHOUT CONTRAST TECHNIQUE: Contiguous axial images were obtained from the base of the skull through the vertex without intravenous contrast. COMPARISON:  Prior CT from 07/31/2015. FINDINGS: Brain: Cerebral volume within normal limits for patient age. No significant cerebral white matter disease. No acute intracranial hemorrhage. No evidence for acute large vessel territory infarct. No mass lesion, midline shift, or mass effect. No hydrocephalus. Vascular: No hyperdense vessel identified. Skull: Scalp soft tissues demonstrate no acute abnormality. Calvarium intact. Sinuses/Orbits: No acute abnormality about the globes and orbits. Mild mucosal thickening within the partially visualized maxillary sinuses. Paranasal sinuses are otherwise clear. No mastoid effusion. Other: No other significant findings. IMPRESSION: Normal head CT.  No acute intracranial process identified. Electronically Signed   By: Jeannine Boga M.D.   On: 10/29/2015 20:54    Procedures Procedures (including critical care time)  Medications Ordered in ED Medications  metoCLOPramide (REGLAN) injection 10 mg (10 mg Intravenous Given 10/30/15 0315)  diphenhydrAMINE (BENADRYL) injection 12.5 mg (12.5 mg Intravenous Given 10/30/15 0315)  ketorolac (TORADOL) 15 MG/ML injection 15 mg (15 mg Intravenous  Given 10/30/15 0315)     Initial Impression / Assessment and Plan / ED Course  I have reviewed the triage vital signs and the nursing notes.  Pertinent labs & imaging results that were available during my care of the patient were reviewed by me and considered in my medical decision making (see chart for details).  Clinical Course   Migraine: With associated nausea, dizziness, blurry vision and photophobia. CT head negative. Given IV reglan, benadryl, and toradol with improvement in symptoms.   Final Clinical Impressions(s) / ED Diagnoses   Final diagnoses:  Nonintractable migraine, unspecified migraine type    New Prescriptions Current Discharge Medication List       Velna Ochs, MD 10/30/15 [id number]    Ripley Fraise, MD 10/30/15 [id number]

## 2015-11-12 ENCOUNTER — Emergency Department (HOSPITAL_COMMUNITY): Payer: Self-pay

## 2015-11-12 ENCOUNTER — Emergency Department (HOSPITAL_COMMUNITY)
Admission: EM | Admit: 2015-11-12 | Discharge: 2015-11-12 | Disposition: A | Discharge status: Home / Self Care | Payer: Self-pay | Attending: Emergency Medicine | Admitting: Emergency Medicine

## 2015-11-12 ENCOUNTER — Encounter (HOSPITAL_COMMUNITY): Payer: Self-pay | Admitting: Emergency Medicine

## 2015-11-12 DIAGNOSIS — F17210 Nicotine dependence, cigarettes, uncomplicated: Secondary | ICD-10-CM | POA: Insufficient documentation

## 2015-11-12 DIAGNOSIS — N83202 Unspecified ovarian cyst, left side: Secondary | ICD-10-CM | POA: Insufficient documentation

## 2015-11-12 DIAGNOSIS — A599 Trichomoniasis, unspecified: Secondary | ICD-10-CM | POA: Insufficient documentation

## 2015-11-12 LAB — URINALYSIS, ROUTINE W REFLEX MICROSCOPIC
BILIRUBIN URINE: NEGATIVE
Glucose, UA: NEGATIVE mg/dL
HGB URINE DIPSTICK: NEGATIVE
Ketones, ur: NEGATIVE mg/dL
Nitrite: NEGATIVE
Protein, ur: NEGATIVE mg/dL
SPECIFIC GRAVITY, URINE: 1.025 (ref 1.005–1.030)
pH: 6 (ref 5.0–8.0)

## 2015-11-12 LAB — CBC
HCT: 35.2 % — ABNORMAL LOW (ref 36.0–46.0)
HEMOGLOBIN: 10.8 g/dL — AB (ref 12.0–15.0)
MCH: 27.3 pg (ref 26.0–34.0)
MCHC: 30.7 g/dL (ref 30.0–36.0)
MCV: 88.9 fL (ref 78.0–100.0)
PLATELETS: 272 10*3/uL (ref 150–400)
RBC: 3.96 MIL/uL (ref 3.87–5.11)
RDW: 18.5 % — AB (ref 11.5–15.5)
WBC: 12.1 10*3/uL — ABNORMAL HIGH (ref 4.0–10.5)

## 2015-11-12 LAB — COMPREHENSIVE METABOLIC PANEL
ALBUMIN: 3.7 g/dL (ref 3.5–5.0)
ALK PHOS: 114 U/L (ref 38–126)
ALT: 19 U/L (ref 14–54)
AST: 20 U/L (ref 15–41)
Anion gap: 6 (ref 5–15)
BUN: 10 mg/dL (ref 6–20)
CALCIUM: 9.1 mg/dL (ref 8.9–10.3)
CO2: 22 mmol/L (ref 22–32)
CREATININE: 0.49 mg/dL (ref 0.44–1.00)
Chloride: 109 mmol/L (ref 101–111)
GFR calc Af Amer: >60 mL/min (ref >60)
GFR calc non Af Amer: >60 mL/min (ref >60)
GLUCOSE: 85 mg/dL (ref 65–99)
Potassium: 3.7 mmol/L (ref 3.5–5.1)
SODIUM: 137 mmol/L (ref 135–145)
Total Bilirubin: 0.1 mg/dL — ABNORMAL LOW (ref 0.3–1.2)
Total Protein: 6.8 g/dL (ref 6.5–8.1)

## 2015-11-12 LAB — URINE MICROSCOPIC-ADD ON: RBC / HPF: NONE SEEN RBC/hpf (ref 0–5)

## 2015-11-12 LAB — WET PREP, GENITAL
Clue Cells Wet Prep HPF POC: NONE SEEN
Sperm: NONE SEEN
YEAST WET PREP: NONE SEEN

## 2015-11-12 LAB — LIPASE, BLOOD: Lipase: 29 U/L (ref 11–51)

## 2015-11-12 LAB — GC/CHLAMYDIA PROBE AMP (~~LOC~~) NOT AT ARMC
Chlamydia: NEGATIVE
NEISSERIA GONORRHEA: NEGATIVE

## 2015-11-12 LAB — POC URINE PREG, ED: PREG TEST UR: NEGATIVE

## 2015-11-12 MED ORDER — HYDROCODONE-ACETAMINOPHEN 5-325 MG PO TABS: 2.0000 | ORAL_TABLET | ORAL | 0 refills | Status: DC | PRN

## 2015-11-12 MED ORDER — METRONIDAZOLE 500 MG PO TABS: 500.0000 mg | ORAL_TABLET | Freq: Two times a day (BID) | ORAL | 0 refills | Status: DC

## 2015-11-12 MED ORDER — MORPHINE SULFATE (PF) 4 MG/ML IV SOLN
4.0000 mg | Freq: Once | INTRAVENOUS | Status: AC
  Administered 2015-11-12: 4 mg via INTRAVENOUS
  Filled 2015-11-12: qty 1

## 2015-11-12 MED ORDER — ONDANSETRON HCL 4 MG/2ML IJ SOLN
4.0000 mg | Freq: Once | INTRAMUSCULAR | Status: AC
  Administered 2015-11-12: 4 mg via INTRAVENOUS

## 2015-11-12 MED ORDER — IBUPROFEN 800 MG PO TABS: 800.0000 mg | ORAL_TABLET | Freq: Three times a day (TID) | ORAL | 0 refills | Status: DC

## 2015-11-12 MED ORDER — IOPAMIDOL (ISOVUE-300) INJECTION 61%
INTRAVENOUS | Status: AC
  Administered 2015-11-12: 100 mL
  Filled 2015-11-12: qty 100

## 2015-11-12 MED ORDER — ONDANSETRON 4 MG PO TBDP: 4.0000 mg | ORAL_TABLET | Freq: Three times a day (TID) | ORAL | 0 refills | Status: DC | PRN

## 2015-11-12 MED ORDER — SODIUM CHLORIDE 0.9 % IV BOLUS (SEPSIS)
1000.0000 mL | Freq: Once | INTRAVENOUS | Status: AC
  Administered 2015-11-12: 1000 mL via INTRAVENOUS

## 2015-11-12 MED ORDER — ONDANSETRON HCL 4 MG/2ML IJ SOLN
4.0000 mg | Freq: Once | INTRAMUSCULAR | Status: DC
  Filled 2015-11-12: qty 2

## 2015-11-12 NOTE — ED Triage Notes (Addendum)
Patient having severe abdominal pain in the lower quadrants, intermittent with sharp pain that comes and goes.  Nausea is present and denies any vomiting.  Patient does have a problem with constipation, last BM was 4 days ago.  Patient was given 4mg  Zofran and 70mcg of Fentanyl en route to ED by [facility].

## 2015-11-12 NOTE — ED Notes (Signed)
Pt returned from CT °

## 2015-11-12 NOTE — ED Provider Notes (Signed)
Pt's care turned over to me at 7am.    Ultrasound pending to evaluate for ovarian cyst. .   Pt given additional pain medication.   Ultrasound shows hemorhagic ovarian cyst.  No sign of torsion.  Pt given rx for hydrocodone, ibuprofen and zofran.   Pt is advised to schedule appointment at [facility] for recheck   Fransico Meadow, PA-C 11/12/15 [place], DO 11/12/15 2353

## 2015-11-12 NOTE — ED Notes (Signed)
Pt ambulated to waiting room with out difficulty.

## 2015-11-12 NOTE — ED Provider Notes (Signed)
[facility] DEPT Provider Note   CSN: [id number] Arrival date & time: 11/12/15  [id number]     History   Chief Complaint Chief Complaint  Patient presents with  . Abdominal Pain    HPI Gina Shelton is a 40 y.o. female.  HPI   Patient has a PMH of depression, anxiety, gout, GERD, migraines, cocaine, alcohol dependence, c-section. She describes having an "abdominal let down" surgery. She says that she had a c-section and it came undone so that she had to have it fixed. I cannot find in the chart what kind of specific surgery that she had. But she has a large vertical scar to the abdomen.  She says that she gets pains like this every few months. She has had this pain since Monday to the bilateral lower quadrants, intermittent with sharp pain that comes and goes. She came in today because the pain became very intense. She is tearful and has been constipated since Sunday. She was given Zofran and Fentanyl en route.  Denies fevers, dysuria, bleeding from rectum, no cough, weakness, dizzy, lethargy, loc.  Past Medical History:  Diagnosis Date  . Anxiety   . Depression   . Depression   . GERD (gastroesophageal reflux disease)   . Gout    ble  . Migraine   . Migraine     Patient Active Problem List   Diagnosis Date Noted  . Alcohol dependence ([place]) 06/07/2013  . Cocaine abuse 06/07/2013  . DEPRESSION, MAJOR, RECURRENT 08/17/2007  . ANXIETY STATE NOS 11/15/2006    Past Surgical History:  Procedure Laterality Date  . CESAREAN SECTION  infection at incission requiring return to OR x 2  . TUBAL LIGATION      OB History    Gravida Para Term Preterm AB Living   3 3 0 0 0     SAB TAB Ectopic Multiple Live Births   0 0 0           Home Medications    Prior to Admission medications   Medication Sig Start Date End Date Taking? Authorizing Provider  albuterol (PROVENTIL HFA;VENTOLIN HFA) 108 (90 Base) MCG/ACT inhaler Inhale 2 puffs into the lungs every 4 (four) hours as  needed for wheezing or shortness of breath. 09/23/15  Yes Hillary Corinda Gubler, MD  bisacodyl (DULCOLAX) 5 MG EC tablet Take 1 tablet (5 mg total) by mouth 2 (two) times daily as needed (constipation). 06/23/15  Yes Clayton Bibles, PA-C  carbamazepine (TEGRETOL XR) 200 MG 12 hr tablet Take 1 tablet (200 mg total) by mouth 2 (two) times daily. Mood stabilization. 06/08/13  Yes Patrecia Pour, NP  citalopram (CELEXA) 20 MG tablet Take 20 mg by mouth daily.   Yes Historical Provider, MD  cyclobenzaprine (FLEXERIL) 5 MG tablet Take 1 tablet (5 mg total) by mouth 3 (three) times daily as needed for muscle spasms. 09/23/15  Yes Hillary Corinda Gubler, MD  gabapentin (NEURONTIN) 100 MG capsule Take 1 capsule (100 mg total) by mouth 3 (three) times daily. Anxiety, neuropathic pain. 06/08/13  Yes Patrecia Pour, NP  hydrOXYzine (ATARAX/VISTARIL) 10 MG tablet Take 1 tablet (10 mg total) by mouth every 6 (six) hours as needed for itching. Patient taking differently: Take 10 mg by mouth every 6 (six) hours as needed for anxiety.  09/21/14  Yes Montine Circle, PA-C  ibuprofen (ADVIL,MOTRIN) 200 MG tablet Take 400 mg by mouth every 6 (six) hours as needed for moderate pain.   Yes Historical Provider, MD  naproxen (NAPROSYN) 500 MG tablet Take as needed twice daily with food. Patient taking differently: Take 500 mg by mouth 2 (two) times daily as needed for mild pain. Take as needed twice daily with food. 09/23/15  Yes Hillary Corinda Gubler, MD  risperiDONE (RISPERDAL) 1 MG tablet Take 1 tablet (1 mg total) by mouth at bedtime. 06/08/13  Yes Patrecia Pour, NP  traZODone (DESYREL) 100 MG tablet Take 1 tablet (100 mg total) by mouth at bedtime as needed for sleep. Patient taking differently: Take 100 mg by mouth at bedtime.  06/08/13  Yes Patrecia Pour, NP    Family History Family History  Problem Relation Age of Onset  . Hypertension Mother   . Arthritis Sister   . Anesthesia problems Neg Hx   . Hypotension Neg Hx    . Malignant hyperthermia Neg Hx   . Pseudochol deficiency Neg Hx     Social History Social History  Substance Use Topics  . Smoking status: Current Every Day Smoker    Packs/day: 0.00    Years: 0.00    Types: Cigarettes  . Smokeless tobacco: Never Used     Comment: Currently using Nicotine patch  . Alcohol use 0.0 oz/week     Allergies   Review of patient's allergies indicates no known allergies.   Review of Systems Review of Systems  Review of Systems All other systems negative except as documented in the HPI. All pertinent positives and negatives as reviewed in the HPI.  Physical Exam Updated Vital Signs BP 129/85 (BP Location: Left Arm)   Pulse 81   Temp 98.4 F (36.9 C) (Oral)   Resp 22   SpO2 100%   Physical Exam  Constitutional: She appears well-developed and well-nourished. Distressed: tearful.  HENT:  Head: Normocephalic and atraumatic.  Eyes: Conjunctivae are normal. Pupils are equal, round, and reactive to light.  Neck: Trachea normal, normal range of motion and full passive range of motion without pain. Neck supple.  Cardiovascular: Normal rate, regular rhythm and normal pulses.   Pulmonary/Chest: Effort normal and breath sounds normal. Chest wall is not dull to percussion. She exhibits no tenderness, no crepitus, no edema, no deformity and no retraction.  Abdominal: Soft. Normal appearance and bowel sounds are normal.    Genitourinary:  Genitourinary Comments: No discharge. Tenderness to the cervix but no cervical motion tenderness. She was tender to the uterus on bimanual exam and the uterus felt full. No adnexal tenderness.  Musculoskeletal: Normal range of motion.  Neurological: She is alert. She has normal strength.  Skin: Skin is warm, dry and intact.  Psychiatric: She has a normal mood and affect. Her speech is normal and behavior is normal. Judgment and thought content normal. Cognition and memory are normal.    ED Treatments / Results    Labs (all labs ordered are listed, but only abnormal results are displayed) Labs Reviewed  COMPREHENSIVE METABOLIC PANEL - Abnormal; Notable for the following:       Result Value   Total Bilirubin 0.1 (*)    All other components within normal limits  CBC - Abnormal; Notable for the following:    WBC 12.1 (*)    Hemoglobin 10.8 (*)    HCT 35.2 (*)    RDW 18.5 (*)    All other components within normal limits  URINALYSIS, ROUTINE W REFLEX MICROSCOPIC (NOT AT [facility]) - Abnormal; Notable for the following:    Leukocytes, UA SMALL (*)    All other components within  normal limits  URINE MICROSCOPIC-ADD ON - Abnormal; Notable for the following:    Squamous Epithelial / LPF 0-5 (*)    Bacteria, UA RARE (*)    All other components within normal limits  LIPASE, BLOOD  POC URINE PREG, ED    EKG  EKG Interpretation None       Radiology No results found.  Procedures Procedures (including critical care time)  Medications Ordered in ED Medications  sodium chloride 0.9 % bolus 1,000 mL (not administered)  iopamidol (ISOVUE-300) 61 % injection (not administered)  morphine 4 MG/ML injection 4 mg (not administered)     Initial Impression / Assessment and Plan / ED Course  I have reviewed the triage vital signs and the nursing notes.  Pertinent labs & imaging results that were available during my care of the patient were reviewed by me and considered in my medical decision making (see chart for details).  Clinical Course    CT scan shows hemorrhagic cyst. Will do wet prep and GC on pelvic exam and order  transvag US to r/o torsion or other acute pathology to explain pain.  Alyse Low, PA-C to follow-up on [place].  Final Clinical Impressions(s) / ED Diagnoses   Final diagnoses:  None    New Prescriptions New Prescriptions   No medications on file     Delos Haring, PA-C 11/12/15 0703    Delos Haring, PA-C 11/12/15 [place], DO 11/12/15 [id number]

## 2015-11-12 NOTE — ED Notes (Signed)
Patient transported to Ultrasound 

## 2015-11-12 NOTE — ED Notes (Signed)
Patient transported to CT 

## 2015-12-25 ENCOUNTER — Emergency Department (HOSPITAL_COMMUNITY)
Admission: EM | Admit: 2015-12-25 | Discharge: 2015-12-26 | Disposition: A | Discharge status: Home / Self Care | Payer: Self-pay | Attending: Emergency Medicine | Admitting: Emergency Medicine

## 2015-12-25 ENCOUNTER — Emergency Department (HOSPITAL_COMMUNITY): Payer: Self-pay

## 2015-12-25 ENCOUNTER — Encounter (HOSPITAL_COMMUNITY): Payer: Self-pay | Admitting: *Deleted

## 2015-12-25 DIAGNOSIS — F17210 Nicotine dependence, cigarettes, uncomplicated: Secondary | ICD-10-CM | POA: Insufficient documentation

## 2015-12-25 DIAGNOSIS — Z79899 Other long term (current) drug therapy: Secondary | ICD-10-CM | POA: Insufficient documentation

## 2015-12-25 DIAGNOSIS — J209 Acute bronchitis, unspecified: Secondary | ICD-10-CM | POA: Insufficient documentation

## 2015-12-25 DIAGNOSIS — K029 Dental caries, unspecified: Secondary | ICD-10-CM | POA: Insufficient documentation

## 2015-12-25 DIAGNOSIS — Z720 Tobacco use: Secondary | ICD-10-CM

## 2015-12-25 LAB — COMPREHENSIVE METABOLIC PANEL
ALK PHOS: 144 U/L — AB (ref 38–126)
ALT: 24 U/L (ref 14–54)
ANION GAP: 7 (ref 5–15)
AST: 23 U/L (ref 15–41)
Albumin: 4 g/dL (ref 3.5–5.0)
BILIRUBIN TOTAL: 0.4 mg/dL (ref 0.3–1.2)
BUN: 16 mg/dL (ref 6–20)
CALCIUM: 9.4 mg/dL (ref 8.9–10.3)
CO2: 22 mmol/L (ref 22–32)
Chloride: 109 mmol/L (ref 101–111)
Creatinine, Ser: 0.84 mg/dL (ref 0.44–1.00)
GFR calc Af Amer: >60 mL/min (ref >60)
Glucose, Bld: 119 mg/dL — ABNORMAL HIGH (ref 65–99)
POTASSIUM: 3.6 mmol/L (ref 3.5–5.1)
Sodium: 138 mmol/L (ref 135–145)
TOTAL PROTEIN: 7.8 g/dL (ref 6.5–8.1)

## 2015-12-25 LAB — URINALYSIS, ROUTINE W REFLEX MICROSCOPIC
BILIRUBIN URINE: NEGATIVE
Glucose, UA: NEGATIVE mg/dL
KETONES UR: NEGATIVE mg/dL
LEUKOCYTES UA: NEGATIVE
NITRITE: NEGATIVE
PH: 6.5 (ref 5.0–8.0)
Protein, ur: NEGATIVE mg/dL
SPECIFIC GRAVITY, URINE: 1.03 (ref 1.005–1.030)

## 2015-12-25 LAB — CBC
HEMATOCRIT: 38.3 % (ref 36.0–46.0)
HEMOGLOBIN: 12.6 g/dL (ref 12.0–15.0)
MCH: 28.4 pg (ref 26.0–34.0)
MCHC: 32.9 g/dL (ref 30.0–36.0)
MCV: 86.5 fL (ref 78.0–100.0)
Platelets: 294 10*3/uL (ref 150–400)
RBC: 4.43 MIL/uL (ref 3.87–5.11)
RDW: 15.9 % — AB (ref 11.5–15.5)
WBC: 10.4 10*3/uL (ref 4.0–10.5)

## 2015-12-25 LAB — URINE MICROSCOPIC-ADD ON: WBC UA: NONE SEEN WBC/hpf (ref 0–5)

## 2015-12-25 LAB — LIPASE, BLOOD: Lipase: 30 U/L (ref 11–51)

## 2015-12-25 LAB — PREGNANCY, URINE: Preg Test, Ur: NEGATIVE

## 2015-12-25 MED ORDER — HYDROCODONE-ACETAMINOPHEN 5-325 MG PO TABS
1.0000 | ORAL_TABLET | Freq: Once | ORAL | Status: AC
  Administered 2015-12-25: 1 via ORAL
  Filled 2015-12-25: qty 1

## 2015-12-25 MED ORDER — ONDANSETRON 8 MG PO TBDP
8.0000 mg | ORAL_TABLET | Freq: Once | ORAL | Status: AC
  Administered 2015-12-25: 8 mg via ORAL
  Filled 2015-12-25: qty 1

## 2015-12-25 MED ORDER — HYDROCODONE-ACETAMINOPHEN 5-325 MG PO TABS: 1.0000 | ORAL_TABLET | ORAL | 0 refills | Status: DC | PRN

## 2015-12-25 MED ORDER — AMOXICILLIN 500 MG PO CAPS: 500.0000 mg | ORAL_CAPSULE | Freq: Three times a day (TID) | ORAL | 0 refills | Status: DC

## 2015-12-25 MED ORDER — ALBUTEROL SULFATE HFA 108 (90 BASE) MCG/ACT IN AERS
2.0000 | INHALATION_SPRAY | Freq: Once | RESPIRATORY_TRACT | Status: AC
  Administered 2015-12-25: 2 via RESPIRATORY_TRACT
  Filled 2015-12-25: qty 6.7

## 2015-12-25 MED ORDER — AEROCHAMBER PLUS FLO-VU MEDIUM MISC
1.0000 | Freq: Once | Status: AC
Start: 2015-12-25 — End: 2015-12-25
  Administered 2015-12-25: 1
  Filled 2015-12-25: qty 1

## 2015-12-25 MED ORDER — AMOXICILLIN 500 MG PO CAPS
500.0000 mg | ORAL_CAPSULE | Freq: Once | ORAL | Status: AC
  Administered 2015-12-25: 500 mg via ORAL
  Filled 2015-12-25: qty 1

## 2015-12-25 MED ORDER — ONDANSETRON 4 MG PO TBDP
4.0000 mg | ORAL_TABLET | Freq: Once | ORAL | Status: AC | PRN
  Administered 2015-12-25: 4 mg via ORAL
  Filled 2015-12-25: qty 1

## 2015-12-25 NOTE — ED Triage Notes (Signed)
Pt not in triage room

## 2015-12-25 NOTE — ED Provider Notes (Signed)
[facility] DEPT Provider Note   CSN: [id number] Arrival date & time: 12/25/15  1756     History   Chief Complaint Chief Complaint  Patient presents with  . Headache  . Emesis  . Dental Pain    HPI Gina Shelton is a 40 y.o. female.  HPI  Past Medical History:  Diagnosis Date  . Anxiety   . Depression   . Depression   . GERD (gastroesophageal reflux disease)   . Gout    ble  . Migraine   . Migraine     Patient Active Problem List   Diagnosis Date Noted  . Alcohol dependence ([place]) 06/07/2013  . Cocaine abuse 06/07/2013  . DEPRESSION, MAJOR, RECURRENT 08/17/2007  . ANXIETY STATE NOS 11/15/2006    Past Surgical History:  Procedure Laterality Date  . CESAREAN SECTION  infection at incission requiring return to OR x 2  . TUBAL LIGATION      OB History    Gravida Para Term Preterm AB Living   3 3 0 0 0     SAB TAB Ectopic Multiple Live Births   0 0 0           Home Medications    Prior to Admission medications   Medication Sig Start Date End Date Taking? Authorizing Provider  albuterol (PROVENTIL HFA;VENTOLIN HFA) 108 (90 Base) MCG/ACT inhaler Inhale 2 puffs into the lungs every 4 (four) hours as needed for wheezing or shortness of breath. 09/23/15  Yes Hillary Corinda Gubler, MD  carbamazepine (TEGRETOL XR) 200 MG 12 hr tablet Take 1 tablet (200 mg total) by mouth 2 (two) times daily. Mood stabilization. 06/08/13  Yes Patrecia Pour, NP  citalopram (CELEXA) 20 MG tablet Take 20 mg by mouth daily.   Yes Historical Provider, MD  cyclobenzaprine (FLEXERIL) 5 MG tablet Take 1 tablet (5 mg total) by mouth 3 (three) times daily as needed for muscle spasms. 09/23/15  Yes Hillary Corinda Gubler, MD  gabapentin (NEURONTIN) 100 MG capsule Take 1 capsule (100 mg total) by mouth 3 (three) times daily. Anxiety, neuropathic pain. 06/08/13  Yes Patrecia Pour, NP  hydrOXYzine (ATARAX/VISTARIL) 10 MG tablet Take 1 tablet (10 mg total) by mouth every 6 (six) hours as  needed for itching. Patient taking differently: Take 10 mg by mouth every 6 (six) hours as needed for anxiety.  09/21/14  Yes Montine Circle, PA-C  ibuprofen (ADVIL,MOTRIN) 200 MG tablet Take 400 mg by mouth every 6 (six) hours as needed for moderate pain.   Yes Historical Provider, MD  Ibuprofen (MIDOL) 200 MG CAPS Take 400 mg by mouth daily as needed (pain).   Yes Historical Provider, MD  ondansetron (ZOFRAN ODT) 4 MG disintegrating tablet Take 1 tablet (4 mg total) by mouth every 8 (eight) hours as needed for nausea or vomiting. 11/12/15  Yes Fransico Meadow, PA-C  risperiDONE (RISPERDAL) 1 MG tablet Take 1 tablet (1 mg total) by mouth at bedtime. 06/08/13  Yes Patrecia Pour, NP  traZODone (DESYREL) 100 MG tablet Take 1 tablet (100 mg total) by mouth at bedtime as needed for sleep. Patient taking differently: Take 100 mg by mouth at bedtime.  06/08/13  Yes Patrecia Pour, NP  amoxicillin (AMOXIL) 500 MG capsule Take 1 capsule (500 mg total) by mouth 3 (three) times daily. 12/25/15   Isla Pence, MD  bisacodyl (DULCOLAX) 5 MG EC tablet Take 1 tablet (5 mg total) by mouth 2 (two) times daily as needed (constipation).  Patient not taking: Reported on 12/25/2015 06/23/15   Clayton Bibles, PA-C  HYDROcodone-acetaminophen (NORCO/VICODIN) 5-325 MG tablet Take 1 tablet by mouth every 4 (four) hours as needed. 12/25/15   Isla Pence, MD  ibuprofen (ADVIL,MOTRIN) 800 MG tablet Take 1 tablet (800 mg total) by mouth 3 (three) times daily. Patient not taking: Reported on 12/25/2015 11/12/15   Fransico Meadow, PA-C  metroNIDAZOLE (FLAGYL) 500 MG tablet Take 1 tablet (500 mg total) by mouth 2 (two) times daily. Patient not taking: Reported on 12/25/2015 11/12/15   Fransico Meadow, PA-C    Family History Family History  Problem Relation Age of Onset  . Hypertension Mother   . Arthritis Sister   . Anesthesia problems Neg Hx   . Hypotension Neg Hx   . Malignant hyperthermia Neg Hx   . Pseudochol deficiency Neg  Hx     Social History Social History  Substance Use Topics  . Smoking status: Current Every Day Smoker    Packs/day: 0.00    Years: 0.00    Types: Cigarettes  . Smokeless tobacco: Never Used     Comment: Currently using Nicotine patch  . Alcohol use 0.0 oz/week     Allergies   Review of patient's allergies indicates no known allergies.   Review of Systems Review of Systems   Physical Exam Updated Vital Signs BP 108/86   Pulse 74   Temp 98.3 F (36.8 C) (Oral)   Resp 16   Ht 5\' 2"  (1.575 m)   Wt 195 lb (88.5 kg)   LMP 11/25/2015 Comment: pt states she has been on period for a month  SpO2 99%   BMI 35.67 kg/m   Physical Exam   ED Treatments / Results  Labs (all labs ordered are listed, but only abnormal results are displayed) Labs Reviewed  COMPREHENSIVE METABOLIC PANEL - Abnormal; Notable for the following:       Result Value   Glucose, Bld 119 (*)    Alkaline Phosphatase 144 (*)    All other components within normal limits  CBC - Abnormal; Notable for the following:    RDW 15.9 (*)    All other components within normal limits  URINALYSIS, ROUTINE W REFLEX MICROSCOPIC (NOT AT [facility]) - Abnormal; Notable for the following:    APPearance CLOUDY (*)    Hgb urine dipstick TRACE (*)    All other components within normal limits  URINE MICROSCOPIC-ADD ON - Abnormal; Notable for the following:    Squamous Epithelial / LPF 0-5 (*)    Bacteria, UA RARE (*)    All other components within normal limits  LIPASE, BLOOD  PREGNANCY, URINE    EKG  EKG Interpretation None       Radiology Dg Chest 2 View  Result Date: 12/25/2015 CLINICAL DATA:  Productive cough for 1 month EXAM: CHEST  2 VIEW COMPARISON:  09/23/2015 FINDINGS: The heart size and mediastinal contours are within normal limits. Both lungs are clear. The visualized skeletal structures are unremarkable. IMPRESSION: No active cardiopulmonary disease. Electronically Signed   By: Donavan Foil M.D.   On:  12/25/2015 22:06    Procedures Procedures (including critical care time)  Medications Ordered in ED Medications  ondansetron (ZOFRAN-ODT) disintegrating tablet 4 mg (4 mg Oral Given 12/25/15 1822)  albuterol (PROVENTIL HFA;VENTOLIN HFA) 108 (90 Base) MCG/ACT inhaler 2 puff (2 puffs Inhalation Given 12/25/15 2201)  AEROCHAMBER PLUS FLO-VU MEDIUM MISC 1 each (1 each Other Given 12/25/15 2201)  ondansetron (ZOFRAN-ODT) disintegrating tablet  8 mg (8 mg Oral Given 12/25/15 2159)  HYDROcodone-acetaminophen (NORCO/VICODIN) 5-325 MG per tablet 1 tablet (1 tablet Oral Given 12/25/15 2159)  amoxicillin (AMOXIL) capsule 500 mg (500 mg Oral Given 12/25/15 2159)     Initial Impression / Assessment and Plan / ED Course  I have reviewed the triage vital signs and the nursing notes.  Pertinent labs & imaging results that were available during my care of the patient were reviewed by me and considered in my medical decision making (see chart for details).  Clinical Course   Pt feels much better.  She will be given the number to the [employer].  She knows to return if worse.  Final Clinical Impressions(s) / ED Diagnoses   Final diagnoses:  Dental caries  Acute bronchitis, unspecified organism  Tobacco abuse    New Prescriptions Discharge Medication List as of 12/25/2015 11:28 PM    START taking these medications   Details  amoxicillin (AMOXIL) 500 MG capsule Take 1 capsule (500 mg total) by mouth 3 (three) times daily., Starting Fri 12/25/2015, Print         Isla Pence, MD 12/26/15 [phone]

## 2015-12-25 NOTE — ED Triage Notes (Signed)
Pt complains of emesis for the past 3 days, upper left sided dental pain for the past week, and cough for the past month. Pt also states she has had a headache intermittently for the past 3 weeks. Pt denies abdominal pain.

## 2015-12-25 NOTE — ED Provider Notes (Signed)
[facility] DEPT Provider Note   CSN: [id number] Arrival date & time: 12/25/15  1756     History   Chief Complaint Chief Complaint  Patient presents with  . Headache  . Emesis  . Dental Pain    HPI Gina Shelton is a 40 y.o. female.  Pt presents to the ED for multiple complaints.  She has had a cough for 1 month.  The cough has caused her to be nauseous.  The pt also c/o left upper dental pain and a headache.  She has tried every otc medicine that she can think of without help.        Past Medical History:  Diagnosis Date  . Anxiety   . Depression   . Depression   . GERD (gastroesophageal reflux disease)   . Gout    ble  . Migraine   . Migraine     Patient Active Problem List   Diagnosis Date Noted  . Alcohol dependence ([place]) 06/07/2013  . Cocaine abuse 06/07/2013  . DEPRESSION, MAJOR, RECURRENT 08/17/2007  . ANXIETY STATE NOS 11/15/2006    Past Surgical History:  Procedure Laterality Date  . CESAREAN SECTION  infection at incission requiring return to OR x 2  . TUBAL LIGATION      OB History    Gravida Para Term Preterm AB Living   3 3 0 0 0     SAB TAB Ectopic Multiple Live Births   0 0 0           Home Medications    Prior to Admission medications   Medication Sig Start Date End Date Taking? Authorizing Provider  albuterol (PROVENTIL HFA;VENTOLIN HFA) 108 (90 Base) MCG/ACT inhaler Inhale 2 puffs into the lungs every 4 (four) hours as needed for wheezing or shortness of breath. 09/23/15  Yes Hillary Corinda Gubler, MD  carbamazepine (TEGRETOL XR) 200 MG 12 hr tablet Take 1 tablet (200 mg total) by mouth 2 (two) times daily. Mood stabilization. 06/08/13  Yes Patrecia Pour, NP  citalopram (CELEXA) 20 MG tablet Take 20 mg by mouth daily.   Yes Historical Provider, MD  cyclobenzaprine (FLEXERIL) 5 MG tablet Take 1 tablet (5 mg total) by mouth 3 (three) times daily as needed for muscle spasms. 09/23/15  Yes Hillary Corinda Gubler, MD    gabapentin (NEURONTIN) 100 MG capsule Take 1 capsule (100 mg total) by mouth 3 (three) times daily. Anxiety, neuropathic pain. 06/08/13  Yes Patrecia Pour, NP  hydrOXYzine (ATARAX/VISTARIL) 10 MG tablet Take 1 tablet (10 mg total) by mouth every 6 (six) hours as needed for itching. Patient taking differently: Take 10 mg by mouth every 6 (six) hours as needed for anxiety.  09/21/14  Yes Montine Circle, PA-C  ibuprofen (ADVIL,MOTRIN) 200 MG tablet Take 400 mg by mouth every 6 (six) hours as needed for moderate pain.   Yes Historical Provider, MD  Ibuprofen (MIDOL) 200 MG CAPS Take 400 mg by mouth daily as needed (pain).   Yes Historical Provider, MD  ondansetron (ZOFRAN ODT) 4 MG disintegrating tablet Take 1 tablet (4 mg total) by mouth every 8 (eight) hours as needed for nausea or vomiting. 11/12/15  Yes Fransico Meadow, PA-C  risperiDONE (RISPERDAL) 1 MG tablet Take 1 tablet (1 mg total) by mouth at bedtime. 06/08/13  Yes Patrecia Pour, NP  traZODone (DESYREL) 100 MG tablet Take 1 tablet (100 mg total) by mouth at bedtime as needed for sleep. Patient taking differently: Take 100 mg  by mouth at bedtime.  06/08/13  Yes Patrecia Pour, NP  amoxicillin (AMOXIL) 500 MG capsule Take 1 capsule (500 mg total) by mouth 3 (three) times daily. 12/25/15   Isla Pence, MD  bisacodyl (DULCOLAX) 5 MG EC tablet Take 1 tablet (5 mg total) by mouth 2 (two) times daily as needed (constipation). Patient not taking: Reported on 12/25/2015 06/23/15   Clayton Bibles, PA-C  HYDROcodone-acetaminophen (NORCO/VICODIN) 5-325 MG tablet Take 1 tablet by mouth every 4 (four) hours as needed. 12/25/15   Isla Pence, MD  ibuprofen (ADVIL,MOTRIN) 800 MG tablet Take 1 tablet (800 mg total) by mouth 3 (three) times daily. Patient not taking: Reported on 12/25/2015 11/12/15   Fransico Meadow, PA-C  metroNIDAZOLE (FLAGYL) 500 MG tablet Take 1 tablet (500 mg total) by mouth 2 (two) times daily. Patient not taking: Reported on 12/25/2015  11/12/15   Fransico Meadow, PA-C    Family History Family History  Problem Relation Age of Onset  . Hypertension Mother   . Arthritis Sister   . Anesthesia problems Neg Hx   . Hypotension Neg Hx   . Malignant hyperthermia Neg Hx   . Pseudochol deficiency Neg Hx     Social History Social History  Substance Use Topics  . Smoking status: Current Every Day Smoker    Packs/day: 0.00    Years: 0.00    Types: Cigarettes  . Smokeless tobacco: Never Used     Comment: Currently using Nicotine patch  . Alcohol use 0.0 oz/week     Allergies   Review of patient's allergies indicates no known allergies.   Review of Systems Review of Systems  HENT: Positive for dental problem.   Respiratory: Positive for cough.   Gastrointestinal: Positive for nausea and vomiting.  Neurological: Positive for headaches.  All other systems reviewed and are negative.    Physical Exam Updated Vital Signs BP 126/92 (BP Location: Right Arm)   Pulse 90   Temp 98.3 F (36.8 C) (Oral)   Resp 16   Ht 5\' 2"  (1.575 m)   Wt 195 lb (88.5 kg)   LMP 11/25/2015 Comment: pt states she has been on period for a month  SpO2 100%   BMI 35.67 kg/m   Physical Exam  Constitutional: She is oriented to person, place, and time. She appears well-developed and well-nourished.  HENT:  Head: Normocephalic and atraumatic.  Right Ear: External ear normal.  Left Ear: External ear normal.  Nose: Nose normal.  Mouth/Throat: Dental caries present.  Eyes: Conjunctivae and EOM are normal. Pupils are equal, round, and reactive to light.  Neck: Normal range of motion. Neck supple.  Cardiovascular: Normal rate, regular rhythm, normal heart sounds and intact distal pulses.   Pulmonary/Chest: Effort normal. She has wheezes.  Abdominal: Soft. Bowel sounds are normal.  Musculoskeletal: Normal range of motion.  Neurological: She is alert and oriented to person, place, and time.  Skin: Skin is warm.  Psychiatric: She has a  normal mood and affect. Her behavior is normal. Judgment and thought content normal.  Nursing note and vitals reviewed.    ED Treatments / Results  Labs (all labs ordered are listed, but only abnormal results are displayed) Labs Reviewed  COMPREHENSIVE METABOLIC PANEL - Abnormal; Notable for the following:       Result Value   Glucose, Bld 119 (*)    Alkaline Phosphatase 144 (*)    All other components within normal limits  CBC - Abnormal; Notable for the following:  RDW 15.9 (*)    All other components within normal limits  URINALYSIS, ROUTINE W REFLEX MICROSCOPIC (NOT AT [facility]) - Abnormal; Notable for the following:    APPearance CLOUDY (*)    Hgb urine dipstick TRACE (*)    All other components within normal limits  URINE MICROSCOPIC-ADD ON - Abnormal; Notable for the following:    Squamous Epithelial / LPF 0-5 (*)    Bacteria, UA RARE (*)    All other components within normal limits  LIPASE, BLOOD  PREGNANCY, URINE    EKG  EKG Interpretation None       Radiology Dg Chest 2 View  Result Date: 12/25/2015 CLINICAL DATA:  Productive cough for 1 month EXAM: CHEST  2 VIEW COMPARISON:  09/23/2015 FINDINGS: The heart size and mediastinal contours are within normal limits. Both lungs are clear. The visualized skeletal structures are unremarkable. IMPRESSION: No active cardiopulmonary disease. Electronically Signed   By: Donavan Foil M.D.   On: 12/25/2015 22:06    Procedures Procedures (including critical care time)  Medications Ordered in ED Medications  ondansetron (ZOFRAN-ODT) disintegrating tablet 4 mg (4 mg Oral Given 12/25/15 1822)  albuterol (PROVENTIL HFA;VENTOLIN HFA) 108 (90 Base) MCG/ACT inhaler 2 puff (2 puffs Inhalation Given 12/25/15 2201)  AEROCHAMBER PLUS FLO-VU MEDIUM MISC 1 each (1 each Other Given 12/25/15 2201)  ondansetron (ZOFRAN-ODT) disintegrating tablet 8 mg (8 mg Oral Given 12/25/15 2159)  HYDROcodone-acetaminophen (NORCO/VICODIN) 5-325 MG per  tablet 1 tablet (1 tablet Oral Given 12/25/15 2159)  amoxicillin (AMOXIL) capsule 500 mg (500 mg Oral Given 12/25/15 2159)     Initial Impression / Assessment and Plan / ED Course  I have reviewed the triage vital signs and the nursing notes.  Pertinent labs & imaging results that were available during my care of the patient were reviewed by me and considered in my medical decision making (see chart for details).  Clinical Course   Pt feels much better.  The pt will be given the number to the [employer].  She knows to return if worse.  Final Clinical Impressions(s) / ED Diagnoses   Final diagnoses:  Dental caries  Acute bronchitis, unspecified organism  Tobacco abuse    New Prescriptions New Prescriptions   AMOXICILLIN (AMOXIL) 500 MG CAPSULE    Take 1 capsule (500 mg total) by mouth 3 (three) times daily.   HYDROCODONE-ACETAMINOPHEN (NORCO/VICODIN) 5-325 MG TABLET    Take 1 tablet by mouth every 4 (four) hours as needed.     Isla Pence, MD 12/25/15 2330

## 2015-12-31 ENCOUNTER — Encounter (HOSPITAL_COMMUNITY): Payer: Self-pay | Admitting: Emergency Medicine

## 2015-12-31 ENCOUNTER — Encounter (HOSPITAL_COMMUNITY): Payer: Self-pay | Admitting: *Deleted

## 2015-12-31 ENCOUNTER — Inpatient Hospital Stay (HOSPITAL_COMMUNITY)
Admission: AD | Admit: 2015-12-31 | Discharge: 2016-01-04 | DRG: 885 | Disposition: A | Discharge status: Home / Self Care | Payer: Federal, State, Local not specified - Other | Source: Intra-hospital | Attending: Psychiatry | Admitting: Psychiatry

## 2015-12-31 ENCOUNTER — Emergency Department (HOSPITAL_COMMUNITY)
Admission: EM | Admit: 2015-12-31 | Discharge: 2015-12-31 | Disposition: A | Discharge status: Other Acute Inpatient Hospital | Payer: Self-pay | Attending: Emergency Medicine | Admitting: Emergency Medicine

## 2015-12-31 DIAGNOSIS — Z9889 Other specified postprocedural states: Secondary | ICD-10-CM | POA: Diagnosis not present

## 2015-12-31 DIAGNOSIS — F322 Major depressive disorder, single episode, severe without psychotic features: Secondary | ICD-10-CM | POA: Diagnosis present

## 2015-12-31 DIAGNOSIS — K219 Gastro-esophageal reflux disease without esophagitis: Secondary | ICD-10-CM | POA: Diagnosis present

## 2015-12-31 DIAGNOSIS — J45909 Unspecified asthma, uncomplicated: Secondary | ICD-10-CM | POA: Diagnosis present

## 2015-12-31 DIAGNOSIS — Z8261 Family history of arthritis: Secondary | ICD-10-CM

## 2015-12-31 DIAGNOSIS — R440 Auditory hallucinations: Secondary | ICD-10-CM | POA: Diagnosis present

## 2015-12-31 DIAGNOSIS — F332 Major depressive disorder, recurrent severe without psychotic features: Secondary | ICD-10-CM | POA: Diagnosis present

## 2015-12-31 DIAGNOSIS — Z23 Encounter for immunization: Secondary | ICD-10-CM | POA: Diagnosis not present

## 2015-12-31 DIAGNOSIS — F17210 Nicotine dependence, cigarettes, uncomplicated: Secondary | ICD-10-CM | POA: Diagnosis present

## 2015-12-31 DIAGNOSIS — F1020 Alcohol dependence, uncomplicated: Secondary | ICD-10-CM | POA: Diagnosis present

## 2015-12-31 DIAGNOSIS — Y904 Blood alcohol level of 80-99 mg/100 ml: Secondary | ICD-10-CM | POA: Diagnosis present

## 2015-12-31 DIAGNOSIS — Z8249 Family history of ischemic heart disease and other diseases of the circulatory system: Secondary | ICD-10-CM | POA: Diagnosis not present

## 2015-12-31 DIAGNOSIS — R45851 Suicidal ideations: Secondary | ICD-10-CM

## 2015-12-31 DIAGNOSIS — Z79899 Other long term (current) drug therapy: Secondary | ICD-10-CM

## 2015-12-31 DIAGNOSIS — F419 Anxiety disorder, unspecified: Secondary | ICD-10-CM | POA: Diagnosis present

## 2015-12-31 DIAGNOSIS — F1411 Cocaine abuse, in remission: Secondary | ICD-10-CM | POA: Diagnosis present

## 2015-12-31 DIAGNOSIS — M109 Gout, unspecified: Secondary | ICD-10-CM | POA: Diagnosis present

## 2015-12-31 DIAGNOSIS — F32A Depression, unspecified: Secondary | ICD-10-CM

## 2015-12-31 DIAGNOSIS — F329 Major depressive disorder, single episode, unspecified: Secondary | ICD-10-CM

## 2015-12-31 LAB — COMPREHENSIVE METABOLIC PANEL
ALK PHOS: 124 U/L (ref 38–126)
ALT: 24 U/L (ref 14–54)
AST: 23 U/L (ref 15–41)
Albumin: 4.1 g/dL (ref 3.5–5.0)
Anion gap: 11 (ref 5–15)
BILIRUBIN TOTAL: 0.5 mg/dL (ref 0.3–1.2)
BUN: 12 mg/dL (ref 6–20)
CO2: 21 mmol/L — ABNORMAL LOW (ref 22–32)
CREATININE: 0.58 mg/dL (ref 0.44–1.00)
Calcium: 9.2 mg/dL (ref 8.9–10.3)
Chloride: 111 mmol/L (ref 101–111)
GFR calc Af Amer: >60 mL/min (ref >60)
Glucose, Bld: 101 mg/dL — ABNORMAL HIGH (ref 65–99)
Potassium: 3.9 mmol/L (ref 3.5–5.1)
Sodium: 143 mmol/L (ref 135–145)
TOTAL PROTEIN: 8.1 g/dL (ref 6.5–8.1)

## 2015-12-31 LAB — RAPID URINE DRUG SCREEN, HOSP PERFORMED
AMPHETAMINES: NOT DETECTED
Barbiturates: NOT DETECTED
Benzodiazepines: NOT DETECTED
Cocaine: NOT DETECTED
Opiates: POSITIVE — AB
Tetrahydrocannabinol: NOT DETECTED

## 2015-12-31 LAB — CBC
HCT: 38.2 % (ref 36.0–46.0)
Hemoglobin: 12.3 g/dL (ref 12.0–15.0)
MCH: 28.7 pg (ref 26.0–34.0)
MCHC: 32.2 g/dL (ref 30.0–36.0)
MCV: 89.3 fL (ref 78.0–100.0)
PLATELETS: 315 10*3/uL (ref 150–400)
RBC: 4.28 MIL/uL (ref 3.87–5.11)
RDW: 15.9 % — AB (ref 11.5–15.5)
WBC: 10.7 10*3/uL — ABNORMAL HIGH (ref 4.0–10.5)

## 2015-12-31 LAB — SALICYLATE LEVEL: Salicylate Lvl: <7 mg/dL (ref 2.8–30.0)

## 2015-12-31 LAB — POC URINE PREG, ED: PREG TEST UR: NEGATIVE

## 2015-12-31 LAB — ACETAMINOPHEN LEVEL: Acetaminophen (Tylenol), Serum: <10 ug/mL — ABNORMAL LOW (ref 10–30)

## 2015-12-31 LAB — ETHANOL: ALCOHOL ETHYL (B): 95 mg/dL — AB (ref <5)

## 2015-12-31 MED ORDER — NICOTINE 21 MG/24HR TD PT24
21.0000 mg | MEDICATED_PATCH | Freq: Every day | TRANSDERMAL | Status: DC
  Administered 2015-12-31 – 2016-01-04 (×5): 21 mg via TRANSDERMAL
  Filled 2015-12-31 (×8): qty 1

## 2015-12-31 MED ORDER — ZOLPIDEM TARTRATE 5 MG PO TABS: 5.0000 mg | ORAL_TABLET | Freq: Every evening | ORAL | Status: DC | PRN

## 2015-12-31 MED ORDER — IBUPROFEN 200 MG PO TABS
600.0000 mg | ORAL_TABLET | Freq: Three times a day (TID) | ORAL | Status: DC | PRN
  Administered 2015-12-31: 600 mg via ORAL
  Filled 2015-12-31: qty 3

## 2015-12-31 MED ORDER — HYDROXYZINE HCL 25 MG PO TABS
25.0000 mg | ORAL_TABLET | Freq: Four times a day (QID) | ORAL | Status: DC | PRN
  Administered 2015-12-31 – 2016-01-04 (×9): 25 mg via ORAL
  Filled 2015-12-31: qty 1
  Filled 2015-12-31: qty 10
  Filled 2015-12-31 (×8): qty 1

## 2015-12-31 MED ORDER — ALBUTEROL SULFATE HFA 108 (90 BASE) MCG/ACT IN AERS
2.0000 | INHALATION_SPRAY | Freq: Four times a day (QID) | RESPIRATORY_TRACT | Status: DC | PRN
  Administered 2015-12-31 – 2016-01-03 (×5): 2 via RESPIRATORY_TRACT
  Filled 2015-12-31: qty 6.7

## 2015-12-31 MED ORDER — ALUM & MAG HYDROXIDE-SIMETH 200-200-20 MG/5ML PO SUSP: 30.0000 mL | ORAL | Status: DC | PRN

## 2015-12-31 MED ORDER — VITAMIN B-1 100 MG PO TABS
100.0000 mg | ORAL_TABLET | Freq: Every day | ORAL | Status: DC
  Administered 2016-01-01 – 2016-01-04 (×4): 100 mg via ORAL
  Filled 2015-12-31 (×6): qty 1

## 2015-12-31 MED ORDER — ACETAMINOPHEN 325 MG PO TABS
650.0000 mg | ORAL_TABLET | ORAL | Status: DC | PRN
  Administered 2016-01-01: 650 mg via ORAL
  Filled 2015-12-31: qty 2

## 2015-12-31 MED ORDER — IBUPROFEN 600 MG PO TABS
600.0000 mg | ORAL_TABLET | Freq: Three times a day (TID) | ORAL | Status: DC | PRN
  Administered 2016-01-01 – 2016-01-04 (×8): 600 mg via ORAL
  Filled 2015-12-31 (×8): qty 1

## 2015-12-31 MED ORDER — RISPERIDONE 1 MG PO TABS
1.0000 mg | ORAL_TABLET | Freq: Every day | ORAL | Status: DC
  Administered 2015-12-31: 1 mg via ORAL
  Filled 2015-12-31 (×3): qty 1

## 2015-12-31 MED ORDER — IBUPROFEN 600 MG PO TABS
600.0000 mg | ORAL_TABLET | Freq: Three times a day (TID) | ORAL | Status: DC | PRN
  Administered 2015-12-31: 600 mg via ORAL
  Filled 2015-12-31: qty 1

## 2015-12-31 MED ORDER — THIAMINE HCL 100 MG/ML IJ SOLN
INTRAMUSCULAR | Status: AC
  Filled 2015-12-31: qty 2

## 2015-12-31 MED ORDER — ADULT MULTIVITAMIN W/MINERALS CH
1.0000 | ORAL_TABLET | Freq: Every day | ORAL | Status: DC
  Administered 2015-12-31 – 2016-01-04 (×5): 1 via ORAL
  Filled 2015-12-31 (×6): qty 1

## 2015-12-31 MED ORDER — ONDANSETRON HCL 4 MG PO TABS
4.0000 mg | ORAL_TABLET | Freq: Three times a day (TID) | ORAL | Status: DC | PRN
  Administered 2016-01-01 – 2016-01-03 (×3): 4 mg via ORAL
  Filled 2015-12-31 (×2): qty 1

## 2015-12-31 MED ORDER — AMOXICILLIN 500 MG PO CAPS
500.0000 mg | ORAL_CAPSULE | Freq: Three times a day (TID) | ORAL | Status: DC
  Administered 2015-12-31 – 2016-01-04 (×13): 500 mg via ORAL
  Filled 2015-12-31 (×2): qty 1
  Filled 2015-12-31: qty 6
  Filled 2015-12-31 (×3): qty 1
  Filled 2015-12-31: qty 6
  Filled 2015-12-31 (×3): qty 1
  Filled 2015-12-31: qty 6
  Filled 2015-12-31 (×8): qty 1

## 2015-12-31 MED ORDER — BENZOCAINE 10 % MT GEL: Freq: Three times a day (TID) | OROMUCOSAL | Status: DC | PRN

## 2015-12-31 MED ORDER — PNEUMOCOCCAL VAC POLYVALENT 25 MCG/0.5ML IJ INJ
0.5000 mL | INJECTION | INTRAMUSCULAR | Status: AC
  Administered 2016-01-02: 0.5 mL via INTRAMUSCULAR

## 2015-12-31 MED ORDER — MAGNESIUM HYDROXIDE 400 MG/5ML PO SUSP: 30.0000 mL | Freq: Every day | ORAL | Status: DC | PRN

## 2015-12-31 MED ORDER — LORAZEPAM 1 MG PO TABS
1.0000 mg | ORAL_TABLET | Freq: Four times a day (QID) | ORAL | Status: AC | PRN
  Administered 2016-01-03 (×2): 1 mg via ORAL
  Filled 2015-12-31 (×3): qty 1

## 2015-12-31 MED ORDER — ONDANSETRON HCL 4 MG PO TABS: 4.0000 mg | ORAL_TABLET | Freq: Three times a day (TID) | ORAL | Status: DC | PRN

## 2015-12-31 MED ORDER — ACETAMINOPHEN 325 MG PO TABS: 650.0000 mg | ORAL_TABLET | ORAL | Status: DC | PRN

## 2015-12-31 MED ORDER — GABAPENTIN 100 MG PO CAPS
100.0000 mg | ORAL_CAPSULE | Freq: Three times a day (TID) | ORAL | Status: DC
  Administered 2015-12-31 – 2016-01-02 (×7): 100 mg via ORAL
  Filled 2015-12-31 (×13): qty 1

## 2015-12-31 MED ORDER — TRAZODONE HCL 100 MG PO TABS
100.0000 mg | ORAL_TABLET | Freq: Every evening | ORAL | Status: DC | PRN
  Administered 2015-12-31: 100 mg via ORAL
  Filled 2015-12-31: qty 1

## 2015-12-31 MED ORDER — LORAZEPAM 1 MG PO TABS: 1.0000 mg | ORAL_TABLET | Freq: Three times a day (TID) | ORAL | Status: DC | PRN

## 2015-12-31 MED ORDER — CARBAMAZEPINE ER 200 MG PO TB12
200.0000 mg | ORAL_TABLET | Freq: Two times a day (BID) | ORAL | Status: DC
  Filled 2015-12-31 (×4): qty 1

## 2015-12-31 MED ORDER — THIAMINE HCL 100 MG/ML IJ SOLN
100.0000 mg | Freq: Once | INTRAMUSCULAR | Status: AC
  Administered 2015-12-31: 100 mg via INTRAMUSCULAR

## 2015-12-31 MED ORDER — INFLUENZA VAC SPLIT QUAD 0.5 ML IM SUSY
0.5000 mL | PREFILLED_SYRINGE | INTRAMUSCULAR | Status: AC
  Administered 2016-01-02: 0.5 mL via INTRAMUSCULAR
  Filled 2015-12-31: qty 0.5
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
## 2015-12-31 NOTE — Progress Notes (Addendum)
Report called to nurse at [facility]. [facility] transport called. Patient is stable at transfer. AVS sent went patient.

## 2015-12-31 NOTE — Tx Team (Signed)
Initial Treatment Plan 12/31/2015 3:22 PM Gina Shelton [id number]    PATIENT STRESSORS: Financial difficulties Health problems Legal issue Occupational concerns Substance abuse   PATIENT STRENGTHS: Average or above average intelligence Communication skills Motivation for treatment/growth Supportive family/friends   PATIENT IDENTIFIED PROBLEMS: At risk for suicide  Depression  "Depression"  "Anxiety"               DISCHARGE CRITERIA:  Ability to meet basic life and health needs Improved stabilization in mood, thinking, and/or behavior Motivation to continue treatment in a less acute level of care Need for constant or close observation no longer present Withdrawal symptoms are absent or subacute and managed without 24-hour nursing intervention  PRELIMINARY DISCHARGE PLAN: Attend 12-step recovery group Outpatient therapy Return to previous living arrangement  PATIENT/FAMILY INVOLVEMENT: This treatment plan has been presented to and reviewed with the patient, Gina Shelton.  The patient and family have been given the opportunity to ask questions and make suggestions.  Dustin Flock, RN 12/31/2015, 3:22 PM

## 2015-12-31 NOTE — BH Assessment (Signed)
[place] Assessment Progress Note  Per Corena Pilgrim, MD, this pt requires psychiatric hospitalization at this time.  Ria Comment, RN, [facility] has assigned pt to [facility] Rm [id number].  Pt has signed Voluntary Admission and Consent for Treatment, as well as Consent to Release Information to her mother, and signed forms have been faxed to [facility].  Pt's nurse has been notified, and agrees to send original paperwork along with pt via [facility], and to call report to [phone].  Jalene Mullet, [place] Triage Specialist [phone]
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
## 2015-12-31 NOTE — ED Notes (Signed)
Marcus with TTS in room with pt

## 2015-12-31 NOTE — ED Notes (Signed)
Pt is c/o headache   

## 2015-12-31 NOTE — Progress Notes (Signed)
Patient did attend the evening karaoke group. Pt was engaged and supportive but did not participate by singing a song.    
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
## 2015-12-31 NOTE — ED Triage Notes (Signed)
Pt states she has a hx of depression and has not had insurance for a while so she has been off her meds for approximately a year   Pt states she recently got fired from her job, has 2 teenagers and one of them is getting into trouble, states she is basically homeless  Pt states she has had some suicidal ideations without a plan

## 2016-01-01 LAB — LIPID PANEL
CHOLESTEROL: 193 mg/dL (ref 0–200)
HDL: 66 mg/dL (ref >40)
LDL Cholesterol: 80 mg/dL (ref 0–99)
Total CHOL/HDL Ratio: 2.9 RATIO
Triglycerides: 236 mg/dL — ABNORMAL HIGH (ref <150)
VLDL: 47 mg/dL — AB (ref 0–40)

## 2016-01-01 MED ORDER — QUETIAPINE FUMARATE 50 MG PO TABS
50.0000 mg | ORAL_TABLET | Freq: Every day | ORAL | Status: DC
  Administered 2016-01-01 – 2016-01-02 (×2): 50 mg via ORAL
  Filled 2016-01-01 (×4): qty 1

## 2016-01-01 MED ORDER — LIDOCAINE 5 % EX PTCH
1.0000 | MEDICATED_PATCH | CUTANEOUS | Status: DC
  Administered 2016-01-02 – 2016-01-03 (×2): 1 via TRANSDERMAL
  Filled 2016-01-01 (×5): qty 1

## 2016-01-01 NOTE — Progress Notes (Signed)
Medication change note  Patient has been talking to peers and wonders if Seroquel might help her sleep. Patient currently takes risperidone 1 mg for mood control and auditory hallucinations. Explained to patient at risk of using antipsychotics for sleep without with benefits but as she does currently take an antipsychotic for mood stabilization and auditory hallucinations she could elect to change to a different medication for these indications. We discussed the pros and cons of a risperidone type medication with more risk of tardive dyskinesia and EPS versus Seroquel which has more metabolic side effects. After discussion of the comparative side effects wrists and benefits the patient would like a trial of Seroquel. We will discontinue the trazodone which she says is ineffective and discontinue the risperidone and start Seroquel 50 mg by mouth daily at bedtime and observe how patient does.  Assessment/plan mood lability and auditory hallucinations plan is to discontinue trazodone and risperidone and begin Seroquel 50 mg by mouth daily at bedtime instead.

## 2016-01-01 NOTE — Progress Notes (Signed)
[facility] MD Progress Note  01/01/2016 12:18 PM  Patient Active Problem List   Diagnosis Date Noted  . Major depressive disorder, single episode, severe without psychosis ([place]) 12/31/2015  . Major depressive disorder, recurrent severe without psychotic features ([place]) 12/31/2015  . Alcohol dependence ([place]) 06/07/2013  . Cocaine abuse 06/07/2013  . DEPRESSION, MAJOR, RECURRENT 08/17/2007  . ANXIETY STATE NOS 11/15/2006    Diagnosis:Depression and alcohol use disorder  Subjective: Patient reports some nausea and shakiness but denies other withdrawal symptoms at present. She states her mood is currently "all right does "although she is somewhat tired and she denies any current suicidal or homicidal ideation, plan or intent and states she is not having any auditory hallucinations at present.  Objective: Well-developed well-nourished woman who is lying in bed in no apparent distress and easily arousable no motor abnormalities noted mood is described as "all right" affect is congruent, thought processes linear and goal-directed thought content denies current auditory hallucinations denies current suicidal or homicidal ideation, plan or intent alert and oriented 3 insight and judgment are fair IQ appears in average range       Current Facility-Administered Medications (Respiratory):  .  albuterol (PROVENTIL HFA;VENTOLIN HFA) 108 (90 Base) MCG/ACT inhaler 2 puff   Current Facility-Administered Medications (Analgesics):  .  acetaminophen (TYLENOL) tablet 650 mg .  ibuprofen (ADVIL,MOTRIN) tablet 600 mg     Current Facility-Administered Medications (Other):  .  alum & mag hydroxide-simeth (MAALOX/MYLANTA) 200-200-20 MG/5ML suspension 30 mL .  amoxicillin (AMOXIL) capsule 500 mg .  benzocaine (ORAJEL) 10 % mucosal gel .  gabapentin (NEURONTIN) capsule 100 mg .  hydrOXYzine (ATARAX/VISTARIL) tablet 25 mg .  Influenza vac split quadrivalent PF (FLUARIX) injection 0.5 mL .  LORazepam (ATIVAN)  tablet 1 mg .  magnesium hydroxide (MILK OF MAGNESIA) suspension 30 mL .  multivitamin with minerals tablet 1 tablet .  nicotine (NICODERM CQ - dosed in mg/24 hours) patch 21 mg .  ondansetron (ZOFRAN) tablet 4 mg .  pneumococcal 23 valent vaccine (PNU-IMMUNE) injection 0.5 mL .  risperiDONE (RISPERDAL) tablet 1 mg .  thiamine (VITAMIN B-1) tablet 100 mg .  traZODone (DESYREL) tablet 100 mg  No current outpatient prescriptions on file.  Vital Signs:Blood pressure 109/80, pulse (!) 118, temperature 98.3 F (36.8 C), temperature source Oral, resp. rate 16, height 5' 4.57" (1.64 m), weight 84.4 kg (186 lb), last menstrual period 12/13/2015.    Lab Results:  Results for orders placed or performed during the hospital encounter of 12/31/15 (from the past 48 hour(s))  Comprehensive metabolic panel     Status: Abnormal   Collection Time: 12/31/15  1:15 AM  Result Value Ref Range   Sodium 143 135 - 145 mmol/L   Potassium 3.9 3.5 - 5.1 mmol/L   Chloride 111 101 - 111 mmol/L   CO2 21 (L) 22 - 32 mmol/L   Glucose, Bld 101 (H) 65 - 99 mg/dL   BUN 12 6 - 20 mg/dL   Creatinine, Ser 0.58 0.44 - 1.00 mg/dL   Calcium 9.2 8.9 - 10.3 mg/dL   Total Protein 8.1 6.5 - 8.1 g/dL   Albumin 4.1 3.5 - 5.0 g/dL   AST 23 15 - 41 U/L   ALT 24 14 - 54 U/L   Alkaline Phosphatase 124 38 - 126 U/L   Total Bilirubin 0.5 0.3 - 1.2 mg/dL   GFR calc non Af Amer >60 >60 mL/min   GFR calc Af Amer >60 >60 mL/min  Comment: (NOTE) The eGFR has been calculated using the CKD EPI equation. This calculation has not been validated in all clinical situations. eGFR's persistently <60 mL/min signify possible Chronic Kidney Disease.    Anion gap 11 5 - 15  Ethanol     Status: Abnormal   Collection Time: 12/31/15  1:15 AM  Result Value Ref Range   Alcohol, Ethyl (B) 95 (H) <5 mg/dL    Comment:        LOWEST DETECTABLE LIMIT FOR SERUM ALCOHOL IS 5 mg/dL FOR MEDICAL PURPOSES ONLY   Salicylate level     Status: None    Collection Time: 12/31/15  1:15 AM  Result Value Ref Range   Salicylate Lvl <6.4 2.8 - 30.0 mg/dL  Acetaminophen level     Status: Abnormal   Collection Time: 12/31/15  1:15 AM  Result Value Ref Range   Acetaminophen (Tylenol), Serum <10 (L) 10 - 30 ug/mL    Comment:        THERAPEUTIC CONCENTRATIONS VARY SIGNIFICANTLY. A RANGE OF 10-30 ug/mL MAY BE AN EFFECTIVE CONCENTRATION FOR MANY PATIENTS. HOWEVER, SOME ARE BEST TREATED AT CONCENTRATIONS OUTSIDE THIS RANGE. ACETAMINOPHEN CONCENTRATIONS >150 ug/mL AT 4 HOURS AFTER INGESTION AND >50 ug/mL AT 12 HOURS AFTER INGESTION ARE OFTEN ASSOCIATED WITH TOXIC REACTIONS.   cbc     Status: Abnormal   Collection Time: 12/31/15  1:15 AM  Result Value Ref Range   WBC 10.7 (H) 4.0 - 10.5 K/uL   RBC 4.28 3.87 - 5.11 MIL/uL   Hemoglobin 12.3 12.0 - 15.0 g/dL   HCT 38.2 36.0 - 46.0 %   MCV 89.3 78.0 - 100.0 fL   MCH 28.7 26.0 - 34.0 pg   MCHC 32.2 30.0 - 36.0 g/dL   RDW 15.9 (H) 11.5 - 15.5 %   Platelets 315 150 - 400 K/uL  Rapid urine drug screen (hospital performed)     Status: Abnormal   Collection Time: 12/31/15  1:28 AM  Result Value Ref Range   Opiates POSITIVE (A) NONE DETECTED   Cocaine NONE DETECTED NONE DETECTED   Benzodiazepines NONE DETECTED NONE DETECTED   Amphetamines NONE DETECTED NONE DETECTED   Tetrahydrocannabinol NONE DETECTED NONE DETECTED   Barbiturates NONE DETECTED NONE DETECTED    Comment:        DRUG SCREEN FOR MEDICAL PURPOSES ONLY.  IF CONFIRMATION IS NEEDED FOR ANY PURPOSE, NOTIFY LAB WITHIN 5 DAYS.        LOWEST DETECTABLE LIMITS FOR URINE DRUG SCREEN Drug Class       Cutoff (ng/mL) Amphetamine      1000 Barbiturate      200 Benzodiazepine   332 Tricyclics       951 Opiates          300 Cocaine          300 THC              50   POC urine preg, ED     Status: None   Collection Time: 12/31/15  1:44 AM  Result Value Ref Range   Preg Test, Ur NEGATIVE NEGATIVE    Comment:        THE  SENSITIVITY OF THIS METHODOLOGY IS >24 mIU/mL     Physical Findings: AIMS: Facial and Oral Movements Muscles of Facial Expression: None, normal Lips and Perioral Area: None, normal Jaw: None, normal Tongue: None, normal,Extremity Movements Upper (arms, wrists, hands, fingers): None, normal Lower (legs, knees, ankles, toes): None, normal, Trunk Movements  Neck, shoulders, hips: None, normal, Overall Severity Severity of abnormal movements (highest score from questions above): None, normal Incapacitation due to abnormal movements: None, normal Patient's awareness of abnormal movements (rate only patient's report): No Awareness, Dental Status Current problems with teeth and/or dentures?: No Does patient usually wear dentures?: No  CIWA:  CIWA-Ar Total: 6 COWS:      Assessment/Plan: Patient appears to be stabilizing and does not currently exhibit any significant withdrawal symptoms. At present plan is to observe her for another couple of days and discharged around Monday, if she needs to leave earlier for her work and does not have any acute suicidal or homicidal ideation, plan or intent she could be discharged over the weekend.  Linard Millers, MD 01/01/2016, 12:18 PM

## 2016-01-01 NOTE — Progress Notes (Signed)
D: Pt is flat, isolative and withdrawn to self even while in the dayroom. Pt endorsed moderate anxiety and mild depression; states, "I feel so anxious that I get short of breath." Pt at the time of assessment denies pain, SI, HI or AVH. Pt remained calm and cooperative.  A: Medications offered as prescribed.  Support, encouragement, and safe environment provided.  15-minute safety checks continue. R: Pt was med compliant.  Pt attended [place] group. Safety checks continue.

## 2016-01-01 NOTE — Progress Notes (Signed)
Recreation Therapy Notes  Date: 01/01/16 Time: 0930 Location: 300 Group Room  Group Topic: Stress Management  Goal Area(s) Addresses:  Patient will verbalize importance of using healthy stress management.  Patient will identify positive emotions associated with healthy stress management.   Intervention: Stress Management  Activity :  Peaceful Waves Imagery.  LRT introduced the stress management of guided imagery.  LRT read a script to allow patients to participate in the technique.  Patients were to follow along as LRT read script.  Education:  Stress Management, Discharge Planning.   Education Outcome: Acknowledges edcuation/In group clarification offered/Needs additional education  Clinical Observations/Feedback:  Pt did not attend group.    Victorino Sparrow, LRT/CTRS         Victorino Sparrow A 01/01/2016 12:39 PM

## 2016-01-01 NOTE — [facility] Suicide Risk Assessment (Signed)
[facility] INPATIENT:  Family/Significant Other Suicide Prevention Education  Suicide Prevention Education:  Contact Attempts: Gladis Riffle (pt's mother) [phone] has been identified by the patient as the family member/significant other with whom the patient will be residing, and identified as the person(s) who will aid the patient in the event of a mental health crisis.  With written consent from the patient, two attempts were made to provide suicide prevention education, prior to and/or following the patient's discharge.  We were unsuccessful in providing suicide prevention education.  A suicide education pamphlet was given to the patient to share with family/significant other.  Date and time of first attempt: 01/01/16 at 3:00PM (voicemail left requesting call back at her earliest convenience.  Date and time of second attempt: 01/04/16 at 10:50AM  [place] 01/01/2016, 3:07 PM

## 2016-01-01 NOTE — Plan of Care (Signed)
Problem: Education: Goal: Knowledge of the prescribed therapeutic regimen will improve Outcome: Progressing Patient did not take her AM medications until late in the morning, but she did take them appropriately, at that time.

## 2016-01-01 NOTE — [facility] Group Notes (Signed)
[place] Group Notes:  (Nursing/MHT/Case Management/Adjunct)  Date:  01/01/2016  Time:  10:08 PM  Type of Therapy:  AA group  Participation Level:  Active  Participation Quality:  Appropriate and Attentive  Affect:  Appropriate  Cognitive:  Alert and Appropriate  Insight:  Appropriate  Engagement in Group:  Supportive  Modes of Intervention:  Support  Summary of Progress/Problems: Participated well; Shared her own story as well.  Maureen Chatters [facility] 01/01/2016, 10:08 PM

## 2016-01-01 NOTE — Progress Notes (Signed)
Data. Patient denies SI/HI/AVH. Patient isolating in her room most of shift. She did spend some time out in the dayroom, but with minimal interactions with peers. Patient affect was blunt and her mood is depressed, per patient. Patient did not complete a self assessment even with staff encouragement. Action. Emotional support and encouragement offered. Education provided on medication, indications and side effect. Q 15 minute checks done for safety. Response. Safety on the unit maintained through 15 minute checks.  Medications taken as prescribed. Attended groups. Remained calm and appropriate through out shift.
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
## 2016-01-01 NOTE — Progress Notes (Signed)
Note for lab orders  Patient has not had a TSH for over a year and her last 2 once to show at doubling and TSH are both both stayed well within the normal limits so we'll recheck. We will order a hemoglobin A1c, lipid panel and prolactin secondary to being on risperidone.  Linard Millers, MD

## 2016-01-01 NOTE — Tx Team (Signed)
Interdisciplinary Treatment and Diagnostic Plan Update  01/01/2016 Time of Session: 9:30AM MADDISYN BURSTEIN MRN: [id number]  Principal Diagnosis: Major depressive disorder, recurrent severe without psychotic features ([place])  Secondary Diagnoses: Principal Problem:   Major depressive disorder, recurrent severe without psychotic features ([place]) Active Problems:   Alcohol dependence ([place])   Current Medications:  Current Facility-Administered Medications  Medication Dose Route Frequency Provider Last Rate Last Dose  . acetaminophen (TYLENOL) tablet 650 mg  650 mg Oral Q4H PRN Patrecia Pour, NP      . albuterol (PROVENTIL HFA;VENTOLIN HFA) 108 (90 Base) MCG/ACT inhaler 2 puff  2 puff Inhalation Q6H PRN Kerrie Buffalo, NP   2 puff at 12/31/15 2011  . alum & mag hydroxide-simeth (MAALOX/MYLANTA) 200-200-20 MG/5ML suspension 30 mL  30 mL Oral PRN Patrecia Pour, NP      . amoxicillin (AMOXIL) capsule 500 mg  500 mg Oral TID Patrecia Pour, NP   500 mg at 12/31/15 1825  . benzocaine (ORAJEL) 10 % mucosal gel   Mouth/Throat TID PRN Linard Millers, MD      . gabapentin (NEURONTIN) capsule 100 mg  100 mg Oral TID Patrecia Pour, NP   100 mg at 12/31/15 1825  . hydrOXYzine (ATARAX/VISTARIL) tablet 25 mg  25 mg Oral Q6H PRN Kerrie Buffalo, NP   25 mg at 12/31/15 2147  . ibuprofen (ADVIL,MOTRIN) tablet 600 mg  600 mg Oral Q8H PRN Kerrie Buffalo, NP   600 mg at 01/01/16 0641  . Influenza vac split quadrivalent PF (FLUARIX) injection 0.5 mL  0.5 mL Intramuscular Tomorrow-1000 Kerrie Buffalo, NP      . LORazepam (ATIVAN) tablet 1 mg  1 mg Oral Q6H PRN Linard Millers, MD      . magnesium hydroxide (MILK OF MAGNESIA) suspension 30 mL  30 mL Oral Daily PRN Patrecia Pour, NP      . multivitamin with minerals tablet 1 tablet  1 tablet Oral Daily Linard Millers, MD   1 tablet at 12/31/15 1511  . nicotine (NICODERM CQ - dosed in mg/24 hours) patch 21 mg  21 mg Transdermal Daily Linard Millers, MD   21 mg at 12/31/15 1528  . ondansetron (ZOFRAN) tablet 4 mg  4 mg Oral Q8H PRN Patrecia Pour, NP      . pneumococcal 23 valent vaccine (PNU-IMMUNE) injection 0.5 mL  0.5 mL Intramuscular Tomorrow-1000 Kerrie Buffalo, NP      . risperiDONE (RISPERDAL) tablet 1 mg  1 mg Oral QHS Patrecia Pour, NP   1 mg at 12/31/15 2147  . thiamine (VITAMIN B-1) tablet 100 mg  100 mg Oral Daily Linard Millers, MD      . traZODone (DESYREL) tablet 100 mg  100 mg Oral QHS PRN Patrecia Pour, NP   100 mg at 12/31/15 2147   PTA Medications: Prescriptions Prior to Admission  Medication Sig Dispense Refill Last Dose  . albuterol (PROVENTIL HFA;VENTOLIN HFA) 108 (90 Base) MCG/ACT inhaler Inhale 2 puffs into the lungs every 4 (four) hours as needed for wheezing or shortness of breath. (Patient not taking: Reported on 12/31/2015) 1 Inhaler 0 Not Taking at Unknown time  . amoxicillin (AMOXIL) 500 MG capsule Take 1 capsule (500 mg total) by mouth 3 (three) times daily. (Patient not taking: Reported on 12/31/2015) 21 capsule 0 Not Taking at Unknown time  . bisacodyl (DULCOLAX) 5 MG EC tablet Take 1 tablet (5 mg total) by mouth 2 (two)  times daily as needed (constipation). (Patient not taking: Reported on 12/25/2015) 14 tablet 0 Not Taking at Unknown time  . carbamazepine (TEGRETOL XR) 200 MG 12 hr tablet Take 1 tablet (200 mg total) by mouth 2 (two) times daily. Mood stabilization. (Patient not taking: Reported on 12/31/2015) 60 tablet 0 Not Taking at Unknown time  . cyclobenzaprine (FLEXERIL) 5 MG tablet Take 1 tablet (5 mg total) by mouth 3 (three) times daily as needed for muscle spasms. (Patient not taking: Reported on 12/31/2015) 21 tablet 0 Not Taking at Unknown time  . gabapentin (NEURONTIN) 100 MG capsule Take 1 capsule (100 mg total) by mouth 3 (three) times daily. Anxiety, neuropathic pain. (Patient not taking: Reported on 12/31/2015) 90 capsule 0 Not Taking at Unknown time  .  HYDROcodone-acetaminophen (NORCO/VICODIN) 5-325 MG tablet Take 1 tablet by mouth every 4 (four) hours as needed. (Patient not taking: Reported on 12/31/2015) 10 tablet 0 Not Taking at Unknown time  . hydrOXYzine (ATARAX/VISTARIL) 10 MG tablet Take 1 tablet (10 mg total) by mouth every 6 (six) hours as needed for itching. (Patient not taking: Reported on 12/31/2015) 30 tablet 0 Not Taking at Unknown time  . ibuprofen (ADVIL,MOTRIN) 200 MG tablet Take 400 mg by mouth every 6 (six) hours as needed for moderate pain.   12/30/2015 at Unknown time  . ibuprofen (ADVIL,MOTRIN) 800 MG tablet Take 1 tablet (800 mg total) by mouth 3 (three) times daily. (Patient not taking: Reported on 12/25/2015) 21 tablet 0 Not Taking at Unknown time  . metroNIDAZOLE (FLAGYL) 500 MG tablet Take 1 tablet (500 mg total) by mouth 2 (two) times daily. (Patient not taking: Reported on 12/25/2015) 14 tablet 0 Not Taking at Unknown time  . ondansetron (ZOFRAN ODT) 4 MG disintegrating tablet Take 1 tablet (4 mg total) by mouth every 8 (eight) hours as needed for nausea or vomiting. (Patient not taking: Reported on 12/31/2015) 20 tablet 0 Not Taking at Unknown time  . risperiDONE (RISPERDAL) 1 MG tablet Take 1 tablet (1 mg total) by mouth at bedtime. (Patient not taking: Reported on 12/31/2015) 30 tablet 0 Not Taking at Unknown time  . traZODone (DESYREL) 100 MG tablet Take 1 tablet (100 mg total) by mouth at bedtime as needed for sleep. (Patient not taking: Reported on 12/31/2015) 30 tablet 0 Not Taking at Unknown time    Patient Stressors: Financial difficulties Health problems Legal issue Occupational concerns Substance abuse  Patient Strengths: Average or above average intelligence Communication skills Motivation for treatment/growth Supportive family/friends  Treatment Modalities: Medication Management, Group therapy, Case management,  1 to 1 session with clinician, Psychoeducation, Recreational therapy.   Physician  Treatment Plan for Primary Diagnosis: Major depressive disorder, recurrent severe without psychotic features ([place]) Long Term Goal(s): Improvement in symptoms so as ready for discharge Improvement in symptoms so as ready for discharge   Short Term Goals: Ability to disclose and discuss suicidal ideas Ability to demonstrate self-control will improve Ability to maintain clinical measurements within normal limits will improve Ability to identify changes in lifestyle to reduce recurrence of condition will improve Ability to identify and develop effective coping behaviors will improve Compliance with prescribed medications will improve Ability to identify triggers associated with substance abuse/mental health issues will improve  Medication Management: Evaluate patient's response, side effects, and tolerance of medication regimen.  Therapeutic Interventions: 1 to 1 sessions, Unit Group sessions and Medication administration.  Evaluation of Outcomes: Progressing  Physician Treatment Plan for Secondary Diagnosis: Principal Problem:   Major depressive  disorder, recurrent severe without psychotic features ([place]) Active Problems:   Alcohol dependence ([place])  Long Term Goal(s): Improvement in symptoms so as ready for discharge Improvement in symptoms so as ready for discharge   Short Term Goals: Ability to disclose and discuss suicidal ideas Ability to demonstrate self-control will improve Ability to maintain clinical measurements within normal limits will improve Ability to identify changes in lifestyle to reduce recurrence of condition will improve Ability to identify and develop effective coping behaviors will improve Compliance with prescribed medications will improve Ability to identify triggers associated with substance abuse/mental health issues will improve     Medication Management: Evaluate patient's response, side effects, and tolerance of medication regimen.  Therapeutic  Interventions: 1 to 1 sessions, Unit Group sessions and Medication administration.  Evaluation of Outcomes: Progressing   RN Treatment Plan for Primary Diagnosis: Major depressive disorder, recurrent severe without psychotic features ([place]) Long Term Goal(s): Knowledge of disease and therapeutic regimen to maintain health will improve  Short Term Goals: Ability to remain free from injury will improve, Ability to disclose and discuss suicidal ideas and Ability to identify and develop effective coping behaviors will improve  Medication Management: RN will administer medications as ordered by provider, will assess and evaluate patient's response and provide education to patient for prescribed medication. RN will report any adverse and/or side effects to prescribing provider.  Therapeutic Interventions: 1 on 1 counseling sessions, Psychoeducation, Medication administration, Evaluate responses to treatment, Monitor vital signs and CBGs as ordered, Perform/monitor CIWA, COWS, AIMS and Fall Risk screenings as ordered, Perform wound care treatments as ordered.  Evaluation of Outcomes: Progressing   LCSW Treatment Plan for Primary Diagnosis: Major depressive disorder, recurrent severe without psychotic features ([place]) Long Term Goal(s): Safe transition to appropriate next level of care at discharge, Engage patient in therapeutic group addressing interpersonal concerns.  Short Term Goals: Engage patient in aftercare planning with referrals and resources, Increase ability to appropriately verbalize feelings and Facilitate patient progression through stages of change regarding substance use diagnoses and concerns  Therapeutic Interventions: Assess for all discharge needs, 1 to 1 time with Social worker, Explore available resources and support systems, Assess for adequacy in community support network, Educate family and significant other(s) on suicide prevention, Complete Psychosocial Assessment,  Interpersonal group therapy.  Evaluation of Outcomes: Progressing   Progress in Treatment: Attending groups: Yes Participating in groups: Yes. Taking medication as prescribed: Yes. Toleration medication: Yes. Family/Significant other contact made: No, will contact:  family/support person if pt consents. Patient understands diagnosis: Yes. Discussing patient identified problems/goals with staff: Yes. Medical problems stabilized or resolved: Yes. Denies suicidal/homicidal ideation: Yes. Issues/concerns per patient self-inventory: No. Other: n/a   New problem(s) identified: Yes, Describe:  pt continues to isolate and reports high anxiety.  New Short Term/Long Term Goal(s):  Discharge Plan or Barriers: CSW assessing for appropriate referrals. Pt new to unit. Last admission to [facility] was 06/01/13.   Reason for Continuation of Hospitalization: Anxiety Depression Medication stabilization Withdrawal symptoms  Estimated Length of Stay: 2-4 days   Attendees: Patient: 01/01/2016 8:11 AM  Physician: Dr. Sharolyn Douglas MD 01/01/2016 8:11 AM  Nursing: Lavonna Rua RN 01/01/2016 8:11 AM  RN Care Manager: 01/01/2016 8:11 AM  Social Worker: Maxie Better, LCSW 01/01/2016 8:11 AM  Recreational Therapist:  01/01/2016 8:11 AM  Other:  01/01/2016 8:11 AM  Other:  01/01/2016 8:11 AM  Other: 01/01/2016 8:11 AM    Scribe for Treatment Team: Kimber Relic Smart, LCSW 01/01/2016 8:11 AM

## 2016-01-02 LAB — PROLACTIN: PROLACTIN: 32.7 ng/mL — AB (ref 4.8–23.3)

## 2016-01-02 LAB — TSH: TSH: 1.995 u[IU]/mL (ref 0.350–4.500)

## 2016-01-02 MED ORDER — GABAPENTIN 100 MG PO CAPS
100.0000 mg | ORAL_CAPSULE | Freq: Every day | ORAL | Status: DC
  Administered 2016-01-02: 100 mg via ORAL
  Filled 2016-01-02 (×3): qty 1

## 2016-01-02 NOTE — Progress Notes (Signed)
Patient did attend the evening speaker AA meeting.  

## 2016-01-02 NOTE — Progress Notes (Signed)
D.  Pt pleasant on approach, complaint of neck pain (see pain flowsheet).  Pt was positive for evening AA group, interacting appropriately with peers on the unit.  Pt denies SI/HI/hallucinations at this time.  A.  Support and encouragement offered, medication given as ordered  R. Pt remains safe on the unit, will continue to monitor.

## 2016-01-02 NOTE — [facility] Group Notes (Signed)
  [place] LCSW Group Therapy Note  01/02/2016  At 10:10 - 11:10 AM  Type of Therapy and Topic:  Group Therapy: Avoiding Self-Sabotaging and Enabling Behaviors  Participation Level:  Active  Participation Quality:  Appropriate  Affect:  Depressed and Tearful  Cognitive:  Appropriate  Insight:  Developing/Improving  Engagement in Therapy:  Engaged   Therapeutic models used: Cognitive Behavioral Therapy,  Person-Centered Therapy and Motivational Interviewing  Modes of Intervention:  Discussion, Exploration, Orientation, Rapport Building, Socialization and Support   Summary of Progress/Problems:  The main focus of today's process group was for the patient to identify ways in which they have in the past sabotaged their own recovery. Motivational Interviewing was utilized to identify motivation they may have for wanting to change. The Stages of Change were explained using a handout, and patients identified where they currently are with regard to stages of change. Patient reports she feels like she is in contemplation stage of change following relapse with multiple losses. Patient feels torn on how to proceed and processed her feelings of hopelessness verses hope.    Sheilah Pigeon, LCSW

## 2016-01-02 NOTE — Progress Notes (Signed)
Data. Patient denies SI/HI/AVH. Patient interacting well with staff and other patients. Has C/O anxiety, but has not requested any medications.  Action. Emotional support and encouragement offered. Education provided on medication, indications and side effect. Q 15 minute checks done for safety. Response. Safety on the unit maintained through 15 minute checks.  Medications taken as prescribed. Attended groups. Remained calm and appropriate through out shift.

## 2016-01-02 NOTE — Progress Notes (Signed)
D: Pt is flat, isolative and withdrawn to self even while in the dayroom. Pt endorsed moderate anxiety, moderate depression and moderate chronic back pain; states, "My back pain is bad at this time." Pt at the time of assessment denies SI, HI or AVH. Pt remained calm and cooperative.  A: Medications offered as prescribed.  Support, encouragement, and safe environment provided.  15-minute safety checks continue. R: Pt was med compliant.  Pt attended [place] group meeting. Safety checks continue

## 2016-01-02 NOTE — Progress Notes (Signed)
[facility] MD Progress Note  01/02/2016 12:00 PM  Patient Active Problem List   Diagnosis Date Noted  . Major depressive disorder, single episode, severe without psychosis ([place]) 12/31/2015  . Major depressive disorder, recurrent severe without psychotic features ([place]) 12/31/2015  . Alcohol dependence ([place]) 06/07/2013  . Cocaine abuse 06/07/2013  . DEPRESSION, MAJOR, RECURRENT 08/17/2007  . ANXIETY STATE NOS 11/15/2006    Diagnosis:Depression and alcohol use disorder  Subjective: Patient reports some nausea and shakiness but denies other withdrawal symptoms at present. She states her mood is currently "all right does "although she is somewhat tired and she denies any current suicidal or homicidal ideation, plan or intent and states she is not having any auditory hallucinations at present. She states to cut down on neurontin as she feels she is sedate during the day.  Objective: Well-developed well-nourished woman who is lying in bed in no apparent distress and easily arousable no motor abnormalities noted mood is described as "all right" affect is congruent, thought processes linear and goal-directed thought content denies current auditory hallucinations denies current suicidal or homicidal ideation, plan or intent alert and oriented 3 insight and judgment are fair IQ appears in average range. No psychosis and insight improving.        Current Facility-Administered Medications (Respiratory):  .  albuterol (PROVENTIL HFA;VENTOLIN HFA) 108 (90 Base) MCG/ACT inhaler 2 puff   Current Facility-Administered Medications (Analgesics):  .  acetaminophen (TYLENOL) tablet 650 mg .  ibuprofen (ADVIL,MOTRIN) tablet 600 mg     Current Facility-Administered Medications (Other):  .  alum & mag hydroxide-simeth (MAALOX/MYLANTA) 200-200-20 MG/5ML suspension 30 mL .  amoxicillin (AMOXIL) capsule 500 mg .  benzocaine (ORAJEL) 10 % mucosal gel .  gabapentin (NEURONTIN) capsule 100 mg .  hydrOXYzine  (ATARAX/VISTARIL) tablet 25 mg .  Influenza vac split quadrivalent PF (FLUARIX) injection 0.5 mL .  lidocaine (LIDODERM) 5 % 1 patch .  LORazepam (ATIVAN) tablet 1 mg .  magnesium hydroxide (MILK OF MAGNESIA) suspension 30 mL .  multivitamin with minerals tablet 1 tablet .  nicotine (NICODERM CQ - dosed in mg/24 hours) patch 21 mg .  ondansetron (ZOFRAN) tablet 4 mg .  pneumococcal 23 valent vaccine (PNU-IMMUNE) injection 0.5 mL .  QUEtiapine (SEROQUEL) tablet 50 mg .  thiamine (VITAMIN B-1) tablet 100 mg  No current outpatient prescriptions on file.  Vital Signs:Blood pressure 114/75, pulse 100, temperature 98.4 F (36.9 C), resp. rate 16, height 5' 4.57" (1.64 m), weight 84.4 kg (186 lb), last menstrual period 12/13/2015.    Lab Results:  Results for orders placed or performed during the hospital encounter of 12/31/15 (from the past 48 hour(s))  Prolactin     Status: Abnormal   Collection Time: 01/01/16  6:30 PM  Result Value Ref Range   Prolactin 32.7 (H) 4.8 - 23.3 ng/mL    Comment: (NOTE) Performed At: [facility] [place], [place] [id number] Lindon Romp MD [id number] Performed at [facility]   Lipid panel     Status: Abnormal   Collection Time: 01/01/16  6:30 PM  Result Value Ref Range   Cholesterol 193 0 - 200 mg/dL   Triglycerides 236 (H) <150 mg/dL   HDL 66 >40 mg/dL   Total CHOL/HDL Ratio 2.9 RATIO   VLDL 47 (H) 0 - 40 mg/dL   LDL Cholesterol 80 0 - 99 mg/dL    Comment:        Total Cholesterol/HDL:CHD Risk Coronary Heart Disease Risk Table  Men   Women  1/2 Average Risk   3.4   3.3  Average Risk       5.0   4.4  2 X Average Risk   9.6   7.1  3 X Average Risk  23.4   11.0        Use the calculated Patient Ratio above and the CHD Risk Table to determine the patient's CHD Risk.        ATP III CLASSIFICATION (LDL):  <100     mg/dL   Optimal  100-129  mg/dL   Near or Above                     Optimal  130-159  mg/dL   Borderline  160-189  mg/dL   High  >190     mg/dL   Very High Performed at [facility]   TSH     Status: None   Collection Time: 01/02/16  6:41 AM  Result Value Ref Range   TSH 1.995 0.350 - 4.500 uIU/mL    Comment: Performed by a 3rd Generation assay with a functional sensitivity of <=0.01 uIU/mL. Performed at [facility]     Physical Findings: AIMS: Facial and Oral Movements Muscles of Facial Expression: None, normal Lips and Perioral Area: None, normal Jaw: None, normal Tongue: None, normal,Extremity Movements Upper (arms, wrists, hands, fingers): None, normal Lower (legs, knees, ankles, toes): None, normal, Trunk Movements Neck, shoulders, hips: None, normal, Overall Severity Severity of abnormal movements (highest score from questions above): None, normal Incapacitation due to abnormal movements: None, normal Patient's awareness of abnormal movements (rate only patient's report): No Awareness, Dental Status Current problems with teeth and/or dentures?: No Does patient usually wear dentures?: No  CIWA:  CIWA-Ar Total: 3 COWS:      Assessment/Plan: Patient appears to be stabilizing and does not currently exhibit any significant withdrawal symptoms. At present plan will continue current meds.  Lower neurontin to 100mg  qhs only as she feels tired during the day Possible discharge by Monday.   Merian Capron, MD 01/02/2016, 12:00 PM

## 2016-01-02 NOTE — BHH Group Notes (Signed)
Goals Group  Date:  01/02/2016  Time:  0900  Type of Therapy:  Nurse Education  /  Goals Group:  The group focuses on   Participation Level:  Did Not Attend  Participation Quality:  N/A  Affect:  N/A  Cognitive:  N/A  Insight:  N/A  Engagement in Group:N/A    Modes of Intervention:  N/A  Summary of Progress/Problems:  Lauralyn Primes 01/02/2016, 11:06 AM

## 2016-01-03 DIAGNOSIS — F1020 Alcohol dependence, uncomplicated: Secondary | ICD-10-CM

## 2016-01-03 LAB — HEMOGLOBIN A1C
HEMOGLOBIN A1C: 5.5 % (ref 4.8–5.6)
MEAN PLASMA GLUCOSE: 111 mg/dL

## 2016-01-03 MED ORDER — ONDANSETRON 4 MG PO TBDP
ORAL_TABLET | ORAL | Status: AC
  Administered 2016-01-03: 4 mg
  Filled 2016-01-03: qty 1

## 2016-01-03 MED ORDER — GABAPENTIN 100 MG PO CAPS
100.0000 mg | ORAL_CAPSULE | Freq: Three times a day (TID) | ORAL | Status: DC
  Filled 2016-01-03 (×2): qty 1

## 2016-01-03 MED ORDER — MAGNESIUM CITRATE PO SOLN
1.0000 | Freq: Once | ORAL | Status: AC
  Administered 2016-01-03: 1 via ORAL

## 2016-01-03 MED ORDER — GABAPENTIN 100 MG PO CAPS
100.0000 mg | ORAL_CAPSULE | Freq: Three times a day (TID) | ORAL | Status: DC
  Administered 2016-01-03 – 2016-01-04 (×4): 100 mg via ORAL
  Filled 2016-01-03: qty 1
  Filled 2016-01-03: qty 21
  Filled 2016-01-03: qty 1
  Filled 2016-01-03: qty 21
  Filled 2016-01-03 (×4): qty 1
  Filled 2016-01-03: qty 21

## 2016-01-03 MED ORDER — LORAZEPAM 1 MG PO TABS
1.0000 mg | ORAL_TABLET | Freq: Once | ORAL | Status: AC
  Administered 2016-01-03: 1 mg via ORAL

## 2016-01-03 MED ORDER — ONDANSETRON 4 MG PO TBDP
ORAL_TABLET | ORAL | Status: AC
  Filled 2016-01-03: qty 1

## 2016-01-03 MED ORDER — QUETIAPINE FUMARATE 100 MG PO TABS
100.0000 mg | ORAL_TABLET | Freq: Every day | ORAL | Status: DC
  Administered 2016-01-03: 100 mg via ORAL
  Filled 2016-01-03: qty 1
  Filled 2016-01-03: qty 7
  Filled 2016-01-03: qty 1

## 2016-01-03 NOTE — BHH Group Notes (Signed)
[facility] Group Notes:  (Nursing/MHT/Case Management/Adjunct)  Date:  01/03/2016  Time:  10:28 AM  Type of Therapy:  Psychoeducational Skills  Participation Level:  Active  Participation Quality:  Appropriate  Affect:  Appropriate  Cognitive:  Appropriate  Insight:  Appropriate  Engagement in Group:  Engaged  Modes of Intervention:  Discussion  Summary of Progress/Problems: Pt did attend self inventory group, pt reported that she was negative SI/HI, no AH/VH noted. Pt rated her depression as a 7, and her helplessness/hopelessness as a 5. Pt rated her anxiety as a 9.     Pt reported concerns about her medication being changed, Dr. Nicole Cella made aware.   Benancio Deeds Shanta 01/03/2016, 10:28 AM
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
## 2016-01-03 NOTE — Progress Notes (Signed)
[facility] MD Progress Note  01/03/2016 12:29 PM  Patient Active Problem List   Diagnosis Date Noted  . Major depressive disorder, single episode, severe without psychosis ([place]) 12/31/2015  . Major depressive disorder, recurrent severe without psychotic features ([place]) 12/31/2015  . Alcohol dependence ([place]) 06/07/2013  . Cocaine abuse 06/07/2013  . DEPRESSION, MAJOR, RECURRENT 08/17/2007  . ANXIETY STATE NOS 11/15/2006    Diagnosis:Depression and alcohol use disorder  Subjective: Patient reports some nausea and shakiness but denies other withdrawal symptoms at present. She appears to be down and was tearful. Says it may have since we cut down gabapentin. No  current suicidal or homicidal ideation, plan or intent and states she is having som auditory hallucinations but not commanding.  She  Objective: Well-developed well-nourished woman who is noted to be mobile today. Not in  bed in no apparent distress but endorses feeling down and teary.Thought processes linear and goal-directed thought content, admits to some auditory hallucinations denies current suicidal or homicidal ideation, plan or intent alert and oriented 3 insight and judgment are fair IQ appears in average range.  Mood has gone upset and feeling down.       Current Facility-Administered Medications (Respiratory):  .  albuterol (PROVENTIL HFA;VENTOLIN HFA) 108 (90 Base) MCG/ACT inhaler 2 puff   Current Facility-Administered Medications (Analgesics):  .  acetaminophen (TYLENOL) tablet 650 mg .  ibuprofen (ADVIL,MOTRIN) tablet 600 mg     Current Facility-Administered Medications (Other):  .  alum & mag hydroxide-simeth (MAALOX/MYLANTA) 200-200-20 MG/5ML suspension 30 mL .  amoxicillin (AMOXIL) capsule 500 mg .  benzocaine (ORAJEL) 10 % mucosal gel .  gabapentin (NEURONTIN) capsule 100 mg .  hydrOXYzine (ATARAX/VISTARIL) tablet 25 mg .  lidocaine (LIDODERM) 5 % 1 patch .  LORazepam (ATIVAN) tablet 1 mg .  magnesium  hydroxide (MILK OF MAGNESIA) suspension 30 mL .  multivitamin with minerals tablet 1 tablet .  nicotine (NICODERM CQ - dosed in mg/24 hours) patch 21 mg .  ondansetron (ZOFRAN) tablet 4 mg .  QUEtiapine (SEROQUEL) tablet 100 mg .  thiamine (VITAMIN B-1) tablet 100 mg  No current outpatient prescriptions on file.  Vital Signs:Blood pressure 108/75, pulse (!) 107, temperature 98.7 F (37.1 C), temperature source Oral, resp. rate 16, height 5' 4.57" (1.64 m), weight 84.4 kg (186 lb), last menstrual period 12/13/2015.    Lab Results:  Results for orders placed or performed during the hospital encounter of 12/31/15 (from the past 48 hour(s))  Prolactin     Status: Abnormal   Collection Time: 01/01/16  6:30 PM  Result Value Ref Range   Prolactin 32.7 (H) 4.8 - 23.3 ng/mL    Comment: (NOTE) Performed At: [facility] [place], [place] [id number] Lindon Romp MD [id number] Performed at [facility]   Lipid panel     Status: Abnormal   Collection Time: 01/01/16  6:30 PM  Result Value Ref Range   Cholesterol 193 0 - 200 mg/dL   Triglycerides 236 (H) <150 mg/dL   HDL 66 >40 mg/dL   Total CHOL/HDL Ratio 2.9 RATIO   VLDL 47 (H) 0 - 40 mg/dL   LDL Cholesterol 80 0 - 99 mg/dL    Comment:        Total Cholesterol/HDL:CHD Risk Coronary Heart Disease Risk Table                     Men   Women  1/2 Average Risk   3.4  3.3  Average Risk       5.0   4.4  2 X Average Risk   9.6   7.1  3 X Average Risk  23.4   11.0        Use the calculated Patient Ratio above and the CHD Risk Table to determine the patient's CHD Risk.        ATP III CLASSIFICATION (LDL):  <100     mg/dL   Optimal  100-129  mg/dL   Near or Above                    Optimal  130-159  mg/dL   Borderline  160-189  mg/dL   High  >190     mg/dL   Very High Performed at [facility]   TSH     Status: None   Collection Time: 01/02/16  6:41 AM  Result Value Ref Range    TSH 1.995 0.350 - 4.500 uIU/mL    Comment: Performed by a 3rd Generation assay with a functional sensitivity of <=0.01 uIU/mL. Performed at [facility]     Physical Findings: AIMS: Facial and Oral Movements Muscles of Facial Expression: None, normal Lips and Perioral Area: None, normal Jaw: None, normal Tongue: None, normal,Extremity Movements Upper (arms, wrists, hands, fingers): None, normal Lower (legs, knees, ankles, toes): None, normal, Trunk Movements Neck, shoulders, hips: None, normal, Overall Severity Severity of abnormal movements (highest score from questions above): None, normal Incapacitation due to abnormal movements: None, normal Patient's awareness of abnormal movements (rate only patient's report): No Awareness, Dental Status Current problems with teeth and/or dentures?: No Does patient usually wear dentures?: No  CIWA:  CIWA-Ar Total: 0 COWS:      Assessment/Plan: Patient appears somewhat more depressed.  Will increase back gabapentin to 100mg  tid  increase seroquel 100mg  qhs for sleep and mood stabilization.    Possible discharge by Monday or tuesday  Merian Capron, MD 01/03/2016, 12:29 PM Patient ID: Gina Shelton, female   DOB: 1975/12/28, 40 y.o.   MRN: [id number]

## 2016-01-03 NOTE — Progress Notes (Signed)
Patient did attend the evening speaker AA meeting.  

## 2016-01-03 NOTE — BHH Group Notes (Signed)
    [place] LCSW Group Therapy  01/03/2016  At 10:10 until 11:00 AM   Type of Therapy:  Group Therapy  Participation Level:  Active  Participation Quality:  Attentive and Sharing  Affect:  Flat  Cognitive:  Alert and Oriented  Insight:  Developing/Improving  Engagement in Therapy:  Developing/Improving  Modes of Intervention:  Discussion, Exploration, Rapport Building, Socialization and Support  Summary of Progress/Problems: Topic for today was thoughts and feelings regarding discharge. We discussed fears of upcoming changes including judgements, expectations and stigma of mental health issues. We then discussed supports: what constitutes a supportive framework, identification of supports and what to do when others are not supportive. Patients then processed the quote of "Proceed as if success is inevitable." Patient attended group despite experiencing a high level of physical pain (9 on scale of 1 to 10). Patient processed her feelings of being overwhelmed verses at peace and was able to distinguish that her feelings of being overwhelmed often lead to substance use for her. Patient processed how quote of success may actually get her closer to success.    Sheilah Pigeon, LCSW

## 2016-01-03 NOTE — Progress Notes (Signed)
Patient ID: Gina Shelton, female   DOB: 02-Jun-1975, 40 y.o.   MRN: [id number]     D: Pt has been very flat and depressed on the unit today. Pt has also been very tearful. Pt reported that she was not ready for discharge and that she just needed help. Pt also reported that some changes were made to her medication regimen, and that she needed her regimen to go back to the way that it was. Dr. Nicole Cella was made aware of patients concerns, new orders noted. Pt reported that his depression was a 5, his hopelessness was a 5, and his anxiety was a 8. Pt reported that her goal for today was to get prepared for discharge. Pt reported being negative SI/HI, no AH/VH noted. A: 15 min checks continued for patient safety. R: Pt safety maintained.

## 2016-01-04 MED ORDER — HYDROXYZINE HCL 25 MG PO TABS: 25.0000 mg | ORAL_TABLET | Freq: Four times a day (QID) | ORAL | 0 refills | Status: DC | PRN

## 2016-01-04 MED ORDER — AMOXICILLIN 500 MG PO CAPS: 500.0000 mg | ORAL_CAPSULE | Freq: Three times a day (TID) | ORAL | 0 refills | Status: DC

## 2016-01-04 MED ORDER — QUETIAPINE FUMARATE 100 MG PO TABS: 100.0000 mg | ORAL_TABLET | Freq: Every day | ORAL | 0 refills | Status: DC

## 2016-01-04 MED ORDER — NICOTINE 21 MG/24HR TD PT24: 21.0000 mg | MEDICATED_PATCH | Freq: Every day | TRANSDERMAL | 0 refills | Status: DC

## 2016-01-04 MED ORDER — GABAPENTIN 100 MG PO CAPS: 100.0000 mg | ORAL_CAPSULE | Freq: Three times a day (TID) | ORAL | 0 refills | Status: DC

## 2016-01-04 NOTE — Progress Notes (Signed)
Recreation Therapy Notes  Date: 01/04/16 Time: 0930 Location: 300 Hall Dayroom  Group Topic: Stress Management  Goal Area(s) Addresses:  Patient will verbalize importance of using healthy stress management.  Patient will identify positive emotions associated with healthy stress management.   Behavioral Response: Engaged  Intervention: Stress Management  Activity :  Floating on a Cloud.  LRT introduced the stress management technique of guided imagery.  LRT read a script which allowed patients to participate in the activity.  Patients were to follow along as LRT read script.  Education:  Stress Management, Discharge Planning.   Education Outcome: Acknowledges edcuation/In group clarification offered/Needs additional education  Clinical Observations/Feedback: Pt attended group.    Victorino Sparrow, LRT/CTRS         Victorino Sparrow A 01/04/2016 11:41 AM

## 2016-01-04 NOTE — BHH Suicide Risk Assessment (Signed)
[facility] Discharge Suicide Risk Assessment   Principal Problem: Major depressive disorder, recurrent severe without psychotic features [facility]) Discharge Diagnoses:  Patient Active Problem List   Diagnosis Date Noted  . Major depressive disorder, single episode, severe without psychosis ([place]) [F32.2] 12/31/2015  . Major depressive disorder, recurrent severe without psychotic features ([place]) [F33.2] 12/31/2015  . Alcohol dependence ([place]) [F10.20] 06/07/2013  . Cocaine abuse [F14.10] 06/07/2013  . DEPRESSION, MAJOR, RECURRENT [F33.9] 08/17/2007  . ANXIETY STATE NOS [F41.1] 11/15/2006    Total Time spent with patient: 15 minutes  Musculoskeletal: Strength & Muscle Tone: within normal limits Gait & Station: normal Patient leans: N/A  Psychiatric Specialty Exam: ROS  Blood pressure 116/72, pulse (!) 131, temperature 98.7 F (37.1 C), temperature source Oral, resp. rate 16, height 5' 4.57" (1.64 m), weight 84.4 kg (186 lb), last menstrual period 12/13/2015.Body mass index is 31.37 kg/m.  General Appearance: Casual  Eye Contact::  Good  Speech:  Clear and Coherent409  Volume:  Normal  Mood:  Euthymic  Affect:  Congruent  Thought Process:  Coherent  Orientation:  Full (Time, Place, and Person)  Thought Content:  Negative  Suicidal Thoughts:  No  Homicidal Thoughts:  No  Memory:  Negative  Judgement:  Fair  Insight:  Fair  Psychomotor Activity:  Normal  Concentration:  Good  Recall:  Good  Fund of Knowledge:Good  Language: Good  Akathisia:  No  Handed:  Right  AIMS (if indicated):     Assets:  Resilience  Sleep:  Number of Hours: 5.5  Cognition: WNL  ADL's:  Intact   Mental Status Per Nursing Assessment::   On Admission:  Suicidal ideation indicated by patient, Self-harm thoughts  Demographic Factors:  NA  Loss Factors: Decrease in vocational status  Historical Factors: Family history of suicide  Risk Reduction Factors:   Responsible for children under 6 years of  age, Sense of responsibility to family and Employed  Continued Clinical Symptoms:  Alcohol/Substance Abuse/Dependencies  Cognitive Features That Contribute To Risk:  None    Suicide Risk:  Mild:  Suicidal ideation of limited frequency, intensity, duration, and specificity.  There are no identifiable plans, no associated intent, mild dysphoria and related symptoms, good self-control (both objective and subjective assessment), few other risk factors, and identifiable protective factors, including available and accessible social support.  Follow-up Information    [facility] .   Specialty:  Behavioral Health Why:  Walk in between 8am-9am Monday through Friday (preferably within 48 hours of discharge from the hospital) for hospital follow-up/medication management/assessment for counseling services. Thank you.  Contact information: [place] [address] [phone]           Plan Of Care/Follow-up recommendations:  Other:  Patient should remain in follow-up and keep her appointment with [facility] to address ongoing concerns about anxiety and mood. Currently she denies any suicidal or homicidal ideation plan or intent or any auditory hallucinations.  Linard Millers, MD 01/04/2016, 10:35 AM

## 2016-01-04 NOTE — Progress Notes (Signed)
Patient discharged to lobby. Patient was stable and appreciative at that time. All papers, samples and prescriptions were given and valuables returned. Verbal understanding expressed. Denies SI/HI and A/VH. Patient given opportunity to express concerns and ask questions.  

## 2016-01-04 NOTE — Plan of Care (Signed)
Problem: Activity: Goal: Interest or engagement in activities will improve Outcome: Progressing Pt did attend evening AA group   

## 2016-01-04 NOTE — BHH Group Notes (Signed)
Pt attended spiritual care group on grief and loss facilitated by chaplain Takeesha Isley   Group opened with brief discussion and psycho-social ed around grief and loss in relationships and in relation to self - identifying life patterns, circumstances, changes that cause losses. Established group norm of speaking from own life experience. Group goal of establishing open and affirming space for members to share loss and experience with grief, normalize grief experience and provide psycho social education and grief support.     

## 2016-01-04 NOTE — Progress Notes (Signed)
  [facility] Adult Case Management Discharge Plan :  Will you be returning to the same living situation after discharge:  Yes,  home with her mother At discharge, do you have transportation home?: Yes,  bus pass provided Do you have the ability to pay for your medications: Yes,  mental health  Release of information consent forms completed and submitted to medical records by CSW.  Patient to Follow up at: Follow-up Information    [facility] .   Specialty:  Behavioral Health Why:  Walk in between 8am-9am Monday through Friday (preferably within 48 hours of discharge from the hospital) for hospital follow-up/medication management/assessment for counseling services. Thank you.  Contact information: [place] [address] [phone]           Next level of care provider has access to [place] and Suicide Prevention discussed: Yes,  Contact attempts made with pt's mother. SPE completed with pt.  Have you used any form of tobacco in the last 30 days? (Cigarettes, Smokeless Tobacco, Cigars, and/or Pipes): Yes  Has patient been referred to the Quitline?: Patient refused referral  Patient has been referred for addiction treatment: Yes  Shahzain Kiester N Smart LCSW 01/04/2016, 10:56 AM
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
## 2016-01-04 NOTE — Tx Team (Signed)
Interdisciplinary Treatment and Diagnostic Plan Update  01/04/2016 Time of Session: 9:30AM Gina Shelton MRN: [id number]  Principal Diagnosis: Major depressive disorder, recurrent severe without psychotic features ([place])  Secondary Diagnoses: Principal Problem:   Major depressive disorder, recurrent severe without psychotic features ([place]) Active Problems:   Alcohol dependence ([place])   Current Medications:  Current Facility-Administered Medications  Medication Dose Route Frequency Provider Last Rate Last Dose  . acetaminophen (TYLENOL) tablet 650 mg  650 mg Oral Q4H PRN Patrecia Pour, NP   650 mg at 01/01/16 1357  . albuterol (PROVENTIL HFA;VENTOLIN HFA) 108 (90 Base) MCG/ACT inhaler 2 puff  2 puff Inhalation Q6H PRN Kerrie Buffalo, NP   2 puff at 01/03/16 1259  . alum & mag hydroxide-simeth (MAALOX/MYLANTA) 200-200-20 MG/5ML suspension 30 mL  30 mL Oral PRN Patrecia Pour, NP      . amoxicillin (AMOXIL) capsule 500 mg  500 mg Oral TID Patrecia Pour, NP   500 mg at 01/04/16 0808  . benzocaine (ORAJEL) 10 % mucosal gel   Mouth/Throat TID PRN Linard Millers, MD      . gabapentin (NEURONTIN) capsule 100 mg  100 mg Oral TID Merian Capron, MD   100 mg at 01/04/16 0808  . hydrOXYzine (ATARAX/VISTARIL) tablet 25 mg  25 mg Oral Q6H PRN Kerrie Buffalo, NP   25 mg at 01/04/16 0813  . ibuprofen (ADVIL,MOTRIN) tablet 600 mg  600 mg Oral Q8H PRN Kerrie Buffalo, NP   600 mg at 01/04/16 0126  . lidocaine (LIDODERM) 5 % 1 patch  1 patch Transdermal Q24H Rozetta Nunnery, NP   1 patch at 01/03/16 2201  . magnesium hydroxide (MILK OF MAGNESIA) suspension 30 mL  30 mL Oral Daily PRN Patrecia Pour, NP      . multivitamin with minerals tablet 1 tablet  1 tablet Oral Daily Linard Millers, MD   1 tablet at 01/04/16 [phone]  . nicotine (NICODERM CQ - dosed in mg/24 hours) patch 21 mg  21 mg Transdermal Daily Linard Millers, MD   21 mg at 01/04/16 0809  . ondansetron (ZOFRAN) tablet 4 mg  4 mg  Oral Q8H PRN Patrecia Pour, NP   4 mg at 01/03/16 1017  . QUEtiapine (SEROQUEL) tablet 100 mg  100 mg Oral QHS Merian Capron, MD   100 mg at 01/03/16 2201  . thiamine (VITAMIN B-1) tablet 100 mg  100 mg Oral Daily Linard Millers, MD   100 mg at 01/04/16 5102   PTA Medications: Prescriptions Prior to Admission  Medication Sig Dispense Refill Last Dose  . albuterol (PROVENTIL HFA;VENTOLIN HFA) 108 (90 Base) MCG/ACT inhaler Inhale 2 puffs into the lungs every 4 (four) hours as needed for wheezing or shortness of breath. (Patient not taking: Reported on 12/31/2015) 1 Inhaler 0 Not Taking at Unknown time  . amoxicillin (AMOXIL) 500 MG capsule Take 1 capsule (500 mg total) by mouth 3 (three) times daily. (Patient not taking: Reported on 12/31/2015) 21 capsule 0 Not Taking at Unknown time  . bisacodyl (DULCOLAX) 5 MG EC tablet Take 1 tablet (5 mg total) by mouth 2 (two) times daily as needed (constipation). (Patient not taking: Reported on 12/25/2015) 14 tablet 0 Not Taking at Unknown time  . carbamazepine (TEGRETOL XR) 200 MG 12 hr tablet Take 1 tablet (200 mg total) by mouth 2 (two) times daily. Mood stabilization. (Patient not taking: Reported on 12/31/2015) 60 tablet 0 Not Taking at Unknown time  .  cyclobenzaprine (FLEXERIL) 5 MG tablet Take 1 tablet (5 mg total) by mouth 3 (three) times daily as needed for muscle spasms. (Patient not taking: Reported on 12/31/2015) 21 tablet 0 Not Taking at Unknown time  . gabapentin (NEURONTIN) 100 MG capsule Take 1 capsule (100 mg total) by mouth 3 (three) times daily. Anxiety, neuropathic pain. (Patient not taking: Reported on 12/31/2015) 90 capsule 0 Not Taking at Unknown time  . HYDROcodone-acetaminophen (NORCO/VICODIN) 5-325 MG tablet Take 1 tablet by mouth every 4 (four) hours as needed. (Patient not taking: Reported on 12/31/2015) 10 tablet 0 Not Taking at Unknown time  . hydrOXYzine (ATARAX/VISTARIL) 10 MG tablet Take 1 tablet (10 mg total) by mouth  every 6 (six) hours as needed for itching. (Patient not taking: Reported on 12/31/2015) 30 tablet 0 Not Taking at Unknown time  . ibuprofen (ADVIL,MOTRIN) 200 MG tablet Take 400 mg by mouth every 6 (six) hours as needed for moderate pain.   12/30/2015 at Unknown time  . ibuprofen (ADVIL,MOTRIN) 800 MG tablet Take 1 tablet (800 mg total) by mouth 3 (three) times daily. (Patient not taking: Reported on 12/25/2015) 21 tablet 0 Not Taking at Unknown time  . metroNIDAZOLE (FLAGYL) 500 MG tablet Take 1 tablet (500 mg total) by mouth 2 (two) times daily. (Patient not taking: Reported on 12/25/2015) 14 tablet 0 Not Taking at Unknown time  . ondansetron (ZOFRAN ODT) 4 MG disintegrating tablet Take 1 tablet (4 mg total) by mouth every 8 (eight) hours as needed for nausea or vomiting. (Patient not taking: Reported on 12/31/2015) 20 tablet 0 Not Taking at Unknown time  . risperiDONE (RISPERDAL) 1 MG tablet Take 1 tablet (1 mg total) by mouth at bedtime. (Patient not taking: Reported on 12/31/2015) 30 tablet 0 Not Taking at Unknown time  . traZODone (DESYREL) 100 MG tablet Take 1 tablet (100 mg total) by mouth at bedtime as needed for sleep. (Patient not taking: Reported on 12/31/2015) 30 tablet 0 Not Taking at Unknown time    Patient Stressors: Financial difficulties Health problems Legal issue Occupational concerns Substance abuse  Patient Strengths: Average or above average intelligence Communication skills Motivation for treatment/growth Supportive family/friends  Treatment Modalities: Medication Management, Group therapy, Case management,  1 to 1 session with clinician, Psychoeducation, Recreational therapy.   Physician Treatment Plan for Primary Diagnosis: Major depressive disorder, recurrent severe without psychotic features ([place]) Long Term Goal(s): Improvement in symptoms so as ready for discharge Improvement in symptoms so as ready for discharge   Short Term Goals: Ability to disclose and  discuss suicidal ideas Ability to demonstrate self-control will improve Ability to maintain clinical measurements within normal limits will improve Ability to identify changes in lifestyle to reduce recurrence of condition will improve Ability to identify and develop effective coping behaviors will improve Compliance with prescribed medications will improve Ability to identify triggers associated with substance abuse/mental health issues will improve  Medication Management: Evaluate patient's response, side effects, and tolerance of medication regimen.  Therapeutic Interventions: 1 to 1 sessions, Unit Group sessions and Medication administration.  Evaluation of Outcomes: Met  Physician Treatment Plan for Secondary Diagnosis: Principal Problem:   Major depressive disorder, recurrent severe without psychotic features ([place]) Active Problems:   Alcohol dependence ([place])  Long Term Goal(s): Improvement in symptoms so as ready for discharge Improvement in symptoms so as ready for discharge   Short Term Goals: Ability to disclose and discuss suicidal ideas Ability to demonstrate self-control will improve Ability to maintain clinical measurements within normal  limits will improve Ability to identify changes in lifestyle to reduce recurrence of condition will improve Ability to identify and develop effective coping behaviors will improve Compliance with prescribed medications will improve Ability to identify triggers associated with substance abuse/mental health issues will improve     Medication Management: Evaluate patient's response, side effects, and tolerance of medication regimen.  Therapeutic Interventions: 1 to 1 sessions, Unit Group sessions and Medication administration.  Evaluation of Outcomes: Met   RN Treatment Plan for Primary Diagnosis: Major depressive disorder, recurrent severe without psychotic features ([place]) Long Term Goal(s): Knowledge of disease and therapeutic regimen  to maintain health will improve  Short Term Goals: Ability to remain free from injury will improve, Ability to disclose and discuss suicidal ideas and Ability to identify and develop effective coping behaviors will improve  Medication Management: RN will administer medications as ordered by provider, will assess and evaluate patient's response and provide education to patient for prescribed medication. RN will report any adverse and/or side effects to prescribing provider.  Therapeutic Interventions: 1 on 1 counseling sessions, Psychoeducation, Medication administration, Evaluate responses to treatment, Monitor vital signs and CBGs as ordered, Perform/monitor CIWA, COWS, AIMS and Fall Risk screenings as ordered, Perform wound care treatments as ordered.  Evaluation of Outcomes: Met   LCSW Treatment Plan for Primary Diagnosis: Major depressive disorder, recurrent severe without psychotic features ([place]) Long Term Goal(s): Safe transition to appropriate next level of care at discharge, Engage patient in therapeutic group addressing interpersonal concerns.  Short Term Goals: Engage patient in aftercare planning with referrals and resources, Increase ability to appropriately verbalize feelings and Facilitate patient progression through stages of change regarding substance use diagnoses and concerns  Therapeutic Interventions: Assess for all discharge needs, 1 to 1 time with Social worker, Explore available resources and support systems, Assess for adequacy in community support network, Educate family and significant other(s) on suicide prevention, Complete Psychosocial Assessment, Interpersonal group therapy.  Evaluation of Outcomes: Met   Progress in Treatment: Attending groups: Yes Participating in groups: Yes. Taking medication as prescribed: Yes. Toleration medication: Yes. Family/Significant other contact made: SPE completed with pt; contact attempts made with pt's mother.  Patient  understands diagnosis: Yes. Discussing patient identified problems/goals with staff: Yes. Medical problems stabilized or resolved: Yes. Denies suicidal/homicidal ideation: Yes. Issues/concerns per patient self-inventory: No. Other: n/a  Discharge Plan or Barriers: Pt plans to discharge home with her mother and child. Pt plans to follow-up at [facility]. [facility] [employer] information provided to pt. She was also given bus pass.   Reason for Continuation of Hospitalization: none  Estimated Length of Stay: d/c today   Attendees: Patient: 01/04/2016 10:53 AM  Physician: Dr. Sharolyn Douglas MD 01/04/2016 10:53 AM  Nursing: Towanda Malkin RN 01/04/2016 10:53 AM  RN Care Manager: Lars Pinks RN 01/04/2016 10:53 AM  Social Worker: Maxie Better, LCSW 01/04/2016 10:53 AM  Recreational Therapist:  01/04/2016 10:53 AM  Other:  01/04/2016 10:53 AM  Other:  01/04/2016 10:53 AM  Other: 01/04/2016 10:53 AM    Scribe for Treatment Team: [place], LCSW 01/04/2016 10:53 AM

## 2016-01-13 ENCOUNTER — Encounter (HOSPITAL_COMMUNITY): Payer: Self-pay

## 2016-01-13 ENCOUNTER — Emergency Department (HOSPITAL_COMMUNITY)
Admission: EM | Admit: 2016-01-13 | Discharge: 2016-01-14 | Disposition: A | Discharge status: Home / Self Care | Payer: Self-pay | Attending: Emergency Medicine | Admitting: Emergency Medicine

## 2016-01-13 DIAGNOSIS — R1013 Epigastric pain: Secondary | ICD-10-CM | POA: Insufficient documentation

## 2016-01-13 DIAGNOSIS — F17210 Nicotine dependence, cigarettes, uncomplicated: Secondary | ICD-10-CM | POA: Insufficient documentation

## 2016-01-13 LAB — CBC WITH DIFFERENTIAL/PLATELET
BASOS ABS: 0 10*3/uL (ref 0.0–0.1)
BASOS PCT: 0 %
EOS ABS: 0.3 10*3/uL (ref 0.0–0.7)
Eosinophils Relative: 3 %
HEMATOCRIT: 35.5 % — AB (ref 36.0–46.0)
HEMOGLOBIN: 11.4 g/dL — AB (ref 12.0–15.0)
Lymphocytes Relative: 29 %
Lymphs Abs: 3 10*3/uL (ref 0.7–4.0)
MCH: 28.8 pg (ref 26.0–34.0)
MCHC: 32.1 g/dL (ref 30.0–36.0)
MCV: 89.6 fL (ref 78.0–100.0)
MONO ABS: 0.7 10*3/uL (ref 0.1–1.0)
Monocytes Relative: 7 %
NEUTROS ABS: 6.2 10*3/uL (ref 1.7–7.7)
NEUTROS PCT: 61 %
Platelets: 319 10*3/uL (ref 150–400)
RBC: 3.96 MIL/uL (ref 3.87–5.11)
RDW: 16 % — AB (ref 11.5–15.5)
WBC: 10.1 10*3/uL (ref 4.0–10.5)

## 2016-01-13 LAB — I-STAT BETA HCG BLOOD, ED (MC, WL, AP ONLY)
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
## 2016-01-14 ENCOUNTER — Emergency Department (HOSPITAL_COMMUNITY): Payer: Self-pay

## 2016-01-14 LAB — COMPREHENSIVE METABOLIC PANEL
ALBUMIN: 3.3 g/dL — AB (ref 3.5–5.0)
ALK PHOS: 119 U/L (ref 38–126)
ALT: 17 U/L (ref 14–54)
AST: 19 U/L (ref 15–41)
Anion gap: 6 (ref 5–15)
BUN: 8 mg/dL (ref 6–20)
CO2: 23 mmol/L (ref 22–32)
Calcium: 9 mg/dL (ref 8.9–10.3)
Chloride: 109 mmol/L (ref 101–111)
Creatinine, Ser: 0.6 mg/dL (ref 0.44–1.00)
GFR calc Af Amer: >60 mL/min (ref >60)
GFR calc non Af Amer: >60 mL/min (ref >60)
GLUCOSE: 111 mg/dL — AB (ref 65–99)
POTASSIUM: 3.7 mmol/L (ref 3.5–5.1)
Sodium: 138 mmol/L (ref 135–145)
TOTAL PROTEIN: 6.6 g/dL (ref 6.5–8.1)

## 2016-01-14 LAB — URINE MICROSCOPIC-ADD ON

## 2016-01-14 LAB — URINALYSIS, ROUTINE W REFLEX MICROSCOPIC
Bilirubin Urine: NEGATIVE
Glucose, UA: NEGATIVE mg/dL
KETONES UR: NEGATIVE mg/dL
LEUKOCYTES UA: NEGATIVE
NITRITE: NEGATIVE
PH: 6.5 (ref 5.0–8.0)
Protein, ur: NEGATIVE mg/dL
SPECIFIC GRAVITY, URINE: 1.019 (ref 1.005–1.030)

## 2016-01-14 LAB — LIPASE, BLOOD: Lipase: 43 U/L (ref 11–51)

## 2016-01-14 MED ORDER — MORPHINE SULFATE (PF) 4 MG/ML IV SOLN
4.0000 mg | Freq: Once | INTRAVENOUS | Status: AC
  Administered 2016-01-14: 4 mg via INTRAVENOUS
  Filled 2016-01-14: qty 1

## 2016-01-14 MED ORDER — OXYCODONE-ACETAMINOPHEN 5-325 MG PO TABS
1.0000 | ORAL_TABLET | Freq: Once | ORAL | Status: AC
  Administered 2016-01-14: 1 via ORAL
  Filled 2016-01-14: qty 1

## 2016-01-14 MED ORDER — SUCRALFATE 1 GM/10ML PO SUSP: 1.0000 g | Freq: Three times a day (TID) | ORAL | 0 refills | Status: DC

## 2016-01-14 MED ORDER — FAMOTIDINE 20 MG PO TABS: 20.0000 mg | ORAL_TABLET | Freq: Two times a day (BID) | ORAL | 0 refills | Status: DC

## 2016-01-14 MED ORDER — ONDANSETRON HCL 4 MG PO TABS: 4.0000 mg | ORAL_TABLET | Freq: Four times a day (QID) | ORAL | 0 refills | Status: DC

## 2016-01-14 MED ORDER — SODIUM CHLORIDE 0.9 % IV BOLUS (SEPSIS)
1000.0000 mL | Freq: Once | INTRAVENOUS | Status: AC
  Administered 2016-01-14: 1000 mL via INTRAVENOUS

## 2016-01-14 MED ORDER — ONDANSETRON HCL 4 MG/2ML IJ SOLN
4.0000 mg | Freq: Once | INTRAMUSCULAR | Status: AC
  Administered 2016-01-14: 4 mg via INTRAVENOUS
  Filled 2016-01-14: qty 2
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
## 2016-01-14 NOTE — ED Notes (Signed)
T. Carlota Raspberry PA notified that pt. Is requesting to speak with her prior to discharge .

## 2016-01-14 NOTE — ED Notes (Signed)
Patient transported to X-ray 

## 2016-08-30 ENCOUNTER — Encounter (HOSPITAL_COMMUNITY): Payer: Self-pay | Admitting: *Deleted

## 2016-08-30 ENCOUNTER — Emergency Department (HOSPITAL_COMMUNITY)
Admission: EM | Admit: 2016-08-30 | Discharge: 2016-08-30 | Disposition: A | Discharge status: Home / Self Care | Payer: Self-pay | Attending: Emergency Medicine | Admitting: Emergency Medicine

## 2016-08-30 ENCOUNTER — Emergency Department (HOSPITAL_COMMUNITY): Payer: Self-pay

## 2016-08-30 DIAGNOSIS — H05012 Cellulitis of left orbit: Secondary | ICD-10-CM | POA: Insufficient documentation

## 2016-08-30 DIAGNOSIS — F17210 Nicotine dependence, cigarettes, uncomplicated: Secondary | ICD-10-CM | POA: Insufficient documentation

## 2016-08-30 DIAGNOSIS — F332 Major depressive disorder, recurrent severe without psychotic features: Secondary | ICD-10-CM | POA: Insufficient documentation

## 2016-08-30 DIAGNOSIS — L03213 Periorbital cellulitis: Secondary | ICD-10-CM

## 2016-08-30 DIAGNOSIS — Z79899 Other long term (current) drug therapy: Secondary | ICD-10-CM | POA: Insufficient documentation

## 2016-08-30 LAB — I-STAT CHEM 8, ED
BUN: 8 mg/dL (ref 6–20)
CALCIUM ION: 1.19 mmol/L (ref 1.15–1.40)
CHLORIDE: 107 mmol/L (ref 101–111)
CREATININE: 0.4 mg/dL — AB (ref 0.44–1.00)
Glucose, Bld: 102 mg/dL — ABNORMAL HIGH (ref 65–99)
HEMATOCRIT: 37 % (ref 36.0–46.0)
Hemoglobin: 12.6 g/dL (ref 12.0–15.0)
Potassium: 3.8 mmol/L (ref 3.5–5.1)
SODIUM: 138 mmol/L (ref 135–145)
TCO2: 24 mmol/L (ref 0–100)

## 2016-08-30 LAB — I-STAT BETA HCG BLOOD, ED (MC, WL, AP ONLY): I-stat hCG, quantitative: <5 m[IU]/mL (ref <5)

## 2016-08-30 MED ORDER — HYDROCODONE-ACETAMINOPHEN 5-325 MG PO TABS
1.0000 | ORAL_TABLET | Freq: Once | ORAL | Status: AC
  Administered 2016-08-30: 1 via ORAL
  Filled 2016-08-30: qty 1

## 2016-08-30 MED ORDER — AMOXICILLIN-POT CLAVULANATE 875-125 MG PO TABS: 1.0000 | ORAL_TABLET | Freq: Two times a day (BID) | ORAL | 0 refills | Status: DC

## 2016-08-30 MED ORDER — ERYTHROMYCIN 5 MG/GM OP OINT: TOPICAL_OINTMENT | OPHTHALMIC | 0 refills | Status: DC

## 2016-08-30 MED ORDER — FLUORESCEIN SODIUM 0.6 MG OP STRP
1.0000 | ORAL_STRIP | Freq: Once | OPHTHALMIC | Status: AC
Start: 2016-08-30 — End: 2016-08-30
  Administered 2016-08-30: 1 via OPHTHALMIC
  Filled 2016-08-30: qty 1

## 2016-08-30 MED ORDER — IOPAMIDOL (ISOVUE-300) INJECTION 61%
INTRAVENOUS | Status: AC
  Administered 2016-08-30: 75 mL
  Filled 2016-08-30: qty 75

## 2016-08-30 MED ORDER — TETRACAINE HCL 0.5 % OP SOLN
2.0000 [drp] | Freq: Once | OPHTHALMIC | Status: AC
  Administered 2016-08-30: 2 [drp] via OPHTHALMIC
  Filled 2016-08-30: qty 4

## 2016-08-30 NOTE — Discharge Instructions (Signed)
Please read instructions below. Take the antibiotic, Augmentin, 2 times per day until gone. Apply 1/2 inch ribbon of ointment to your eye, 4 times per day for 1 week. Take 600mg  Advil every 6 hours for pain and inflammation. You can use warm compresses or ice to added relief. Schedule an appointment with your primary care or urgent care to follow up in 3 days. Return to the ER for vision changes, fever, new or worsening symptoms.

## 2016-08-30 NOTE — ED Notes (Signed)
Woods lamp at bedside.  

## 2016-08-30 NOTE — ED Triage Notes (Signed)
Pt states that she had a sty on her left eyelid. States that it burst and now has "green pus" drainage. Pt noted to have swelling to eye.

## 2016-08-30 NOTE — ED Provider Notes (Signed)
[facility] DEPT Provider Note   CSN: [id number] Arrival date & time: 08/30/16  1201  By signing my name below, I, Jeanell Sparrow, attest that this documentation has been prepared under the direction and in the presence of non-physician practitioner, [place] Russo, PA-C. Electronically Signed: Jeanell Sparrow, Scribe. 08/30/2016. 1:45 PM.  History   Chief Complaint Chief Complaint  Patient presents with  . Eye Drainage   The history is provided by the patient. No language interpreter was used.   HPI Comments: Gina Shelton is a 41 y.o. female who presents to the Emergency Department complaining of acute onset of constant moderate left eye pain that started 5 days ago. She states she had a stye to her upper eyelid that burst yesterday with green drainage. She has been taking Tylenol (last dose: 5 hours ago) with temporary relief. Denies foreign body sensation. She reports associated facial swelling, headache, and nausea. Denies any fever, visual disturbance, vomiting, or other complaints at this time. Does not wear contact lenses.   PCP: None  Past Medical History:  Diagnosis Date  . Anxiety   . Depression   . Depression   . GERD (gastroesophageal reflux disease)   . Gout    ble  . Migraine   . Migraine     Patient Active Problem List   Diagnosis Date Noted  . Major depressive disorder, single episode, severe without psychosis ([place]) 12/31/2015  . Major depressive disorder, recurrent severe without psychotic features ([place]) 12/31/2015  . Alcohol dependence ([place]) 06/07/2013  . Cocaine abuse 06/07/2013  . DEPRESSION, MAJOR, RECURRENT 08/17/2007  . ANXIETY STATE NOS 11/15/2006    Past Surgical History:  Procedure Laterality Date  . CESAREAN SECTION  infection at incission requiring return to OR x 2  . TUBAL LIGATION      OB History    Gravida Para Term Preterm AB Living   3 3 0 0 0     SAB TAB Ectopic Multiple Live Births   0 0 0           Home Medications     Prior to Admission medications   Medication Sig Start Date End Date Taking? Authorizing Provider  albuterol (PROVENTIL HFA;VENTOLIN HFA) 108 (90 Base) MCG/ACT inhaler Inhale 2 puffs into the lungs every 4 (four) hours as needed for wheezing or shortness of breath. 09/23/15  Yes Rogue Bussing, MD  aspirin EC 81 MG tablet Take 81 mg by mouth daily.   Yes [provider]  gabapentin (NEURONTIN) 100 MG capsule Take 1 capsule (100 mg total) by mouth 3 (three) times daily. Patient taking differently: Take 100 mg by mouth 2 (two) times daily.  01/04/16  Yes Derrill Center, NP  ibuprofen (ADVIL,MOTRIN) 200 MG tablet Take 200-800 mg by mouth every 6 (six) hours as needed for mild pain.   Yes [provider]  White Petrolatum-Mineral Oil (STYE OP) Place 1-5 drops into both eyes daily as needed (pain).   Yes [provider]  amoxicillin (AMOXIL) 500 MG capsule Take 1 capsule (500 mg total) by mouth 3 (three) times daily. Patient not taking: Reported on 01/14/2016 01/04/16   Derrill Center, NP  amoxicillin-clavulanate (AUGMENTIN) 875-125 MG tablet Take 1 tablet by mouth every 12 (twelve) hours. 08/30/16   Russo, [place] N, PA-C  erythromycin ophthalmic ointment Place a 1/2 inch ribbon of ointment into the lower eyelid, 4 times per day for 7 days. 08/30/16   Russo, [place] N, PA-C  famotidine (PEPCID) 20 MG  tablet Take 1 tablet (20 mg total) by mouth 2 (two) times daily. 01/14/16   Delos Haring, PA-C  hydrOXYzine (ATARAX/VISTARIL) 25 MG tablet Take 1 tablet (25 mg total) by mouth every 6 (six) hours as needed for anxiety. 01/04/16   Derrill Center, NP  nicotine (NICODERM CQ - DOSED IN MG/24 HOURS) 21 mg/24hr patch Place 1 patch (21 mg total) onto the skin daily. Patient not taking: Reported on 01/14/2016 01/05/16   Derrill Center, NP  ondansetron (ZOFRAN) 4 MG tablet Take 1 tablet (4 mg total) by mouth every 6 (six) hours. 01/14/16   Delos Haring, PA-C  QUEtiapine  (SEROQUEL) 100 MG tablet Take 1 tablet (100 mg total) by mouth at bedtime. Patient not taking: Reported on 08/30/2016 01/04/16   Derrill Center, NP  sucralfate (CARAFATE) 1 GM/10ML suspension Take 10 mLs (1 g total) by mouth 4 (four) times daily -  with meals and at bedtime. 01/14/16   Delos Haring, PA-C    Family History Family History  Problem Relation Age of Onset  . Hypertension Mother   . Arthritis Sister   . Anesthesia problems Neg Hx   . Hypotension Neg Hx   . Malignant hyperthermia Neg Hx   . Pseudochol deficiency Neg Hx     Social History Social History  Substance Use Topics  . Smoking status: Current Every Day Smoker    Packs/day: 1.00    Years: 0.00    Types: Cigarettes  . Smokeless tobacco: Never Used     Comment: Currently using Nicotine patch  . Alcohol use 0.0 oz/week     Comment: occasionally- 2 drinks per week     Allergies   Patient has no known allergies.   Review of Systems Review of Systems  Constitutional: Negative for fever.  HENT: Positive for facial swelling.   Eyes: Positive for pain and discharge. Negative for photophobia and visual disturbance.  Gastrointestinal: Positive for nausea. Negative for vomiting.  Neurological: Positive for headaches.     Physical Exam Updated Vital Signs BP (!) 128/99 (BP Location: Right Arm)   Pulse 92   Temp 98.9 F (37.2 C) (Oral)   Resp 16   LMP 08/09/2016   SpO2 99%   Physical Exam  Constitutional: She appears well-developed and well-nourished.  HENT:  Head: Normocephalic and atraumatic.  Eyes: EOM are normal. Pupils are equal, round, and reactive to light. No foreign body present in the left eye.  Left upper eyelid with edema and erythema. Diffusely tender, with increased tenderness over the medial portion. Edema surrounding eye over brow, maxilla and laterally to ear. Left conjunctiva is mildly injected.  Yellow-green discharge present. Conjunctiva visualized under Woods lamp with fluorescein;  no fluorescein uptake noted. Right eye appears normal.   Pulmonary/Chest: Effort normal.  Psychiatric: She has a normal mood and affect. Her behavior is normal.  Nursing note and vitals reviewed.    ED Treatments / Results  DIAGNOSTIC STUDIES: Oxygen Saturation is 99% on RA, normal by my interpretation.    COORDINATION OF CARE: 1:49 PM- Pt advised of plan for treatment and pt agrees.  Labs (all labs ordered are listed, but only abnormal results are displayed) Labs Reviewed  I-STAT CHEM 8, ED - Abnormal; Notable for the following:       Result Value   Creatinine, Ser 0.40 (*)    Glucose, Bld 102 (*)    All other components within normal limits  I-STAT BETA HCG BLOOD, ED ([facility], [facility], [facility] ONLY)  EKG  EKG Interpretation None       Radiology Ct Orbits W Contrast  Result Date: 08/30/2016 CLINICAL DATA:  LEFT eye swelling and pain for 5 days, drainage for 1 day. EXAM: CT ORBITS WITH CONTRAST TECHNIQUE: Multidetector CT images was performed according to the standard protocol following intravenous contrast administration. CONTRAST:  45mL ISOVUE-300 IOPAMIDOL (ISOVUE-300) INJECTION 61% COMPARISON:  CT HEAD October 29, 2015 FINDINGS: ORBITS: Intact ocular globes. Lenses are located. Normal appearance of the optic nerve sheath complexes. Preservation of the orbital fat. Normal appearance of the extraocular muscles which are well located. Superior ophthalmic veins are not enlarged. SINUSES: Trace paranasal sinus mucosal thickening without air-fluid levels. Mastoid air cells are well aerated. INTRACRANIAL CONTENTS: Normal. OSSEOUS STRUCTURES/ SOFT TISSUES: Mild LEFT periorbital soft tissue swelling and subcutaneous fat stranding without focal fluid collection, subcutaneous gas or radiopaque foreign bodies. IMPRESSION: LEFT periorbital cellulitis without abscess or postseptal involvement. Electronically Signed   By: Elon Alas M.D.   On: 08/30/2016 15:10    Procedures Procedures  (including critical care time)  Medications Ordered in ED Medications  HYDROcodone-acetaminophen (NORCO/VICODIN) 5-325 MG per tablet 1 tablet (1 tablet Oral Given 08/30/16 1405)  iopamidol (ISOVUE-300) 61 % injection (75 mLs  Contrast Given 08/30/16 1449)  fluorescein ophthalmic strip 1 strip (1 strip Left Eye Given 08/30/16 1609)  tetracaine (PONTOCAINE) 0.5 % ophthalmic solution 2 drop (2 drops Left Eye Given 08/30/16 1609)     Initial Impression / Assessment and Plan / ED Course  I have reviewed the triage vital signs and the nursing notes.  Pertinent labs & imaging results that were available during my care of the patient were reviewed by me and considered in my medical decision making (see chart for details).     Patient presenting with left eye edema and pain. CT of the orbits done due to amount of tenderness with exam surrounding the eye. CT showing periorbital cellulitis, without abscess or post septal involvement. No obvious corneal abrasion. Patient with abnormal visual acuity to left, however reports she is having difficulty with seeing through her swollen eyelid and is unable to clear the discharge in her eye to see clearly. Will treat with PO Augmentin for cellulitis and topical erythromycin. Symptomatic management for pain. Patient is to follow-up with primary care/urgent care in 3 days. She is to return to the ER sooner for fever, worsening symptoms, or continued blurry vision. Patient is afebrile, safe for discharge home.   Pt discussed with Quincy Carnes, PA-C.  Discussed results, findings, treatment and follow up. Patient advised of return precautions. Patient verbalized understanding and agreed with plan.  Final Clinical Impressions(s) / ED Diagnoses   Final diagnoses:  Periorbital cellulitis of left eye    New Prescriptions Discharge Medication List as of 08/30/2016  4:18 PM    START taking these medications   Details  amoxicillin-clavulanate (AUGMENTIN) 875-125 MG  tablet Take 1 tablet by mouth every 12 (twelve) hours., Starting Tue 08/30/2016, Print    erythromycin ophthalmic ointment Place a 1/2 inch ribbon of ointment into the lower eyelid, 4 times per day for 7 days., Print       I personally performed the services described in this documentation, which was scribed in my presence. The recorded information has been reviewed and is accurate.    Russo, [place] N, PA-C 08/30/16 1825    Quintella Reichert, MD 08/31/16 1037

## 2016-10-13 ENCOUNTER — Emergency Department (HOSPITAL_COMMUNITY): Payer: Medicaid Other

## 2016-10-13 ENCOUNTER — Encounter (HOSPITAL_COMMUNITY): Payer: Self-pay | Admitting: Neurology

## 2016-10-13 ENCOUNTER — Emergency Department (HOSPITAL_COMMUNITY)
Admission: EM | Admit: 2016-10-13 | Discharge: 2016-10-13 | Disposition: A | Discharge status: Home / Self Care | Payer: Medicaid Other | Attending: Emergency Medicine | Admitting: Emergency Medicine

## 2016-10-13 DIAGNOSIS — F17210 Nicotine dependence, cigarettes, uncomplicated: Secondary | ICD-10-CM | POA: Insufficient documentation

## 2016-10-13 DIAGNOSIS — Z87898 Personal history of other specified conditions: Secondary | ICD-10-CM | POA: Diagnosis not present

## 2016-10-13 DIAGNOSIS — R51 Headache: Secondary | ICD-10-CM | POA: Diagnosis present

## 2016-10-13 DIAGNOSIS — I10 Essential (primary) hypertension: Secondary | ICD-10-CM | POA: Insufficient documentation

## 2016-10-13 DIAGNOSIS — Z7982 Long term (current) use of aspirin: Secondary | ICD-10-CM | POA: Diagnosis not present

## 2016-10-13 DIAGNOSIS — R202 Paresthesia of skin: Secondary | ICD-10-CM | POA: Insufficient documentation

## 2016-10-13 DIAGNOSIS — Z79899 Other long term (current) drug therapy: Secondary | ICD-10-CM | POA: Diagnosis not present

## 2016-10-13 DIAGNOSIS — G43501 Persistent migraine aura without cerebral infarction, not intractable, with status migrainosus: Secondary | ICD-10-CM | POA: Diagnosis not present

## 2016-10-13 LAB — I-STAT TROPONIN, ED: Troponin i, poc: 0 ng/mL (ref 0.00–0.08)

## 2016-10-13 LAB — PROTIME-INR
INR: 0.98
Prothrombin Time: 13 seconds (ref 11.4–15.2)

## 2016-10-13 LAB — CBC
HCT: 37.3 % (ref 36.0–46.0)
HEMOGLOBIN: 11.9 g/dL — AB (ref 12.0–15.0)
MCH: 28 pg (ref 26.0–34.0)
MCHC: 31.9 g/dL (ref 30.0–36.0)
MCV: 87.8 fL (ref 78.0–100.0)
Platelets: 263 10*3/uL (ref 150–400)
RBC: 4.25 MIL/uL (ref 3.87–5.11)
RDW: 17.5 % — ABNORMAL HIGH (ref 11.5–15.5)
WBC: 7.5 10*3/uL (ref 4.0–10.5)

## 2016-10-13 LAB — I-STAT CHEM 8, ED
BUN: 7 mg/dL (ref 6–20)
CHLORIDE: 107 mmol/L (ref 101–111)
CREATININE: 0.5 mg/dL (ref 0.44–1.00)
Calcium, Ion: 1.09 mmol/L — ABNORMAL LOW (ref 1.15–1.40)
Glucose, Bld: 96 mg/dL (ref 65–99)
HEMATOCRIT: 39 % (ref 36.0–46.0)
Hemoglobin: 13.3 g/dL (ref 12.0–15.0)
Potassium: 4.2 mmol/L (ref 3.5–5.1)
SODIUM: 139 mmol/L (ref 135–145)
TCO2: 26 mmol/L (ref 0–100)

## 2016-10-13 LAB — COMPREHENSIVE METABOLIC PANEL
ALT: 14 U/L (ref 14–54)
ANION GAP: 8 (ref 5–15)
AST: 17 U/L (ref 15–41)
Albumin: 3.7 g/dL (ref 3.5–5.0)
Alkaline Phosphatase: 109 U/L (ref 38–126)
BUN: 7 mg/dL (ref 6–20)
CHLORIDE: 107 mmol/L (ref 101–111)
CO2: 23 mmol/L (ref 22–32)
Calcium: 8.9 mg/dL (ref 8.9–10.3)
Creatinine, Ser: 0.57 mg/dL (ref 0.44–1.00)
GFR calc non Af Amer: >60 mL/min (ref >60)
Glucose, Bld: 96 mg/dL (ref 65–99)
Potassium: 4.2 mmol/L (ref 3.5–5.1)
SODIUM: 138 mmol/L (ref 135–145)
Total Bilirubin: 0.6 mg/dL (ref 0.3–1.2)
Total Protein: 6.4 g/dL — ABNORMAL LOW (ref 6.5–8.1)

## 2016-10-13 LAB — DIFFERENTIAL
BASOS ABS: 0 10*3/uL (ref 0.0–0.1)
BASOS PCT: 0 %
Eosinophils Absolute: 0.1 10*3/uL (ref 0.0–0.7)
Eosinophils Relative: 1 %
LYMPHS ABS: 2.6 10*3/uL (ref 0.7–4.0)
LYMPHS PCT: 34 %
Monocytes Absolute: 0.6 10*3/uL (ref 0.1–1.0)
Monocytes Relative: 8 %
NEUTROS ABS: 4.3 10*3/uL (ref 1.7–7.7)
NEUTROS PCT: 57 %

## 2016-10-13 LAB — APTT: APTT: 24 s (ref 24–36)

## 2016-10-13 MED ORDER — METOCLOPRAMIDE HCL 5 MG/ML IJ SOLN
10.0000 mg | Freq: Once | INTRAMUSCULAR | Status: AC
  Administered 2016-10-13: 10 mg via INTRAVENOUS
  Filled 2016-10-13: qty 2

## 2016-10-13 MED ORDER — KETOROLAC TROMETHAMINE 15 MG/ML IJ SOLN
15.0000 mg | Freq: Once | INTRAMUSCULAR | Status: AC
  Administered 2016-10-13: 15 mg via INTRAVENOUS
  Filled 2016-10-13: qty 1

## 2016-10-13 MED ORDER — LORAZEPAM 2 MG/ML IJ SOLN
1.0000 mg | INTRAMUSCULAR | Status: DC | PRN
  Administered 2016-10-13: 1 mg via INTRAVENOUS
  Filled 2016-10-13: qty 1

## 2016-10-13 MED ORDER — LISINOPRIL 10 MG PO TABS: 10.0000 mg | ORAL_TABLET | Freq: Every day | ORAL | 1 refills | Status: DC

## 2016-10-13 NOTE — Consult Note (Signed)
Requesting Physician: Dr. Kathrynn Humble    Chief Complaint: code stroke  History obtained from:  Patient     HPI:                                                                                                                                         Gina Shelton is an 41 y.o. female with 2 weeks HA that comes and goes associated with light headed and dizzy sensation. She sits down and it goes away. Today she had another HA associated with intermittent right sided numbness and throbbing periorbital HA. Due to some shaking noted by her family she was brought to hospital. He BP in EMS 170/110 while in hospital 157/91.      Date last known well: Date: 10/13/2016 Time last known well: Time: 12:40 tPA Given: No: minimal symptoms  Modified Rankin: Rankin Score=0    Past Medical History:  Diagnosis Date  . Anxiety   . Depression   . Depression   . GERD (gastroesophageal reflux disease)   . Gout    ble  . Migraine   . Migraine     Past Surgical History:  Procedure Laterality Date  . CESAREAN SECTION  infection at incission requiring return to OR x 2  . TUBAL LIGATION      Family History  Problem Relation Age of Onset  . Hypertension Mother   . Arthritis Sister   . Anesthesia problems Neg Hx   . Hypotension Neg Hx   . Malignant hyperthermia Neg Hx   . Pseudochol deficiency Neg Hx    Social History:  reports that she has been smoking Cigarettes.  She has been smoking about 1.00 pack per day for the past 0.00 years. She has never used smokeless tobacco. She reports that she drinks alcohol. She reports that she does not use drugs.  Allergies: No Known Allergies  Medications:                                                                                                                           No current facility-administered medications for this encounter.    Current Outpatient Prescriptions  Medication Sig Dispense Refill  . albuterol (PROVENTIL HFA;VENTOLIN HFA) 108 (90  Base) MCG/ACT inhaler Inhale 2 puffs into the lungs every 4 (four) hours as needed for wheezing or shortness of breath. 1 Inhaler 0  .  amoxicillin (AMOXIL) 500 MG capsule Take 1 capsule (500 mg total) by mouth 3 (three) times daily. (Patient not taking: Reported on 01/14/2016) 6 capsule 0  . amoxicillin-clavulanate (AUGMENTIN) 875-125 MG tablet Take 1 tablet by mouth every 12 (twelve) hours. 14 tablet 0  . aspirin EC 81 MG tablet Take 81 mg by mouth daily.    Marland Kitchen erythromycin ophthalmic ointment Place a 1/2 inch ribbon of ointment into the lower eyelid, 4 times per day for 7 days. 3.5 g 0  . famotidine (PEPCID) 20 MG tablet Take 1 tablet (20 mg total) by mouth 2 (two) times daily. 30 tablet 0  . gabapentin (NEURONTIN) 100 MG capsule Take 1 capsule (100 mg total) by mouth 3 (three) times daily. (Patient taking differently: Take 100 mg by mouth 2 (two) times daily. ) 90 capsule 0  . hydrOXYzine (ATARAX/VISTARIL) 25 MG tablet Take 1 tablet (25 mg total) by mouth every 6 (six) hours as needed for anxiety. 30 tablet 0  . ibuprofen (ADVIL,MOTRIN) 200 MG tablet Take 200-800 mg by mouth every 6 (six) hours as needed for mild pain.    . nicotine (NICODERM CQ - DOSED IN MG/24 HOURS) 21 mg/24hr patch Place 1 patch (21 mg total) onto the skin daily. (Patient not taking: Reported on 01/14/2016) 28 patch 0  . ondansetron (ZOFRAN) 4 MG tablet Take 1 tablet (4 mg total) by mouth every 6 (six) hours. 12 tablet 0  . QUEtiapine (SEROQUEL) 100 MG tablet Take 1 tablet (100 mg total) by mouth at bedtime. (Patient not taking: Reported on 08/30/2016) 30 tablet 0  . sucralfate (CARAFATE) 1 GM/10ML suspension Take 10 mLs (1 g total) by mouth 4 (four) times daily -  with meals and at bedtime. 420 mL 0  . White Petrolatum-Mineral Oil (STYE OP) Place 1-5 drops into both eyes daily as needed (pain).       ROS:                                                                                                                                        History obtained from the patient  General ROS: negative for - chills, fatigue, fever, night sweats, weight gain or weight loss Psychological ROS: negative for - behavioral disorder, hallucinations, memory difficulties, mood swings or suicidal ideation Ophthalmic ROS: negative for - blurry vision, double vision, eye pain or loss of vision ENT ROS: negative for - epistaxis, nasal discharge, oral lesions, sore throat, tinnitus or vertigo Allergy and Immunology ROS: negative for - hives or itchy/watery eyes Hematological and Lymphatic ROS: negative for - bleeding problems, bruising or swollen lymph nodes Endocrine ROS: negative for - galactorrhea, hair pattern changes, polydipsia/polyuria or temperature intolerance Respiratory ROS: negative for - cough, hemoptysis, shortness of breath or wheezing Cardiovascular ROS: negative for - chest pain, dyspnea on exertion, edema or irregular heartbeat Gastrointestinal ROS: negative for - abdominal pain, diarrhea, hematemesis, nausea/vomiting  or stool incontinence Genito-Urinary ROS: negative for - dysuria, hematuria, incontinence or urinary frequency/urgency Musculoskeletal ROS: negative for - joint swelling or muscular weakness Neurological ROS: as noted in HPI Dermatological ROS: negative for rash and skin lesion changes  Neurologic Examination:                                                                                                      Blood pressure (!) 129/96, pulse 73, temperature 98.3 F (36.8 C), temperature source Oral, resp. rate 16, height 5' 2.5" (1.588 m), weight 90.7 kg (200 lb), last menstrual period 10/09/2016, SpO2 100 %.  HEENT-  Normocephalic, no lesions, without obvious abnormality.  Normal external eye and conjunctiva.  Normal TM's bilaterally.  Normal auditory canals and external ears. Normal external nose, mucus membranes and septum.  Normal pharynx. Cardiovascular- S1, S2 normal, pulses palpable throughout   Lungs-  chest clear, no wheezing, rales, normal symmetric air entry Abdomen- normal findings: bowel sounds normal Extremities- no edema Lymph-no adenopathy palpable Musculoskeletal-no joint tenderness, deformity or swelling Skin-warm and dry, no hyperpigmentation, vitiligo, or suspicious lesions  Neurological Examination Mental Status: Alert, oriented, thought content appropriate.  Speech fluent without evidence of aphasia.  Able to follow 3 step commands without difficulty. Cranial Nerves: II: Discs flat bilaterally; Visual fields grossly normal,  III,IV, VI: ptosis not present, extra-ocular motions intact bilaterally, pupils equal, round, reactive to light and accommodation V,VII: smile symmetric, facial light touch sensation normal bilaterally VIII: hearing normal bilaterally IX,X: uvula rises symmetrically XI: bilateral shoulder shrug XII: midline tongue extension Motor: Right : Upper extremity   5/5    Left:     Upper extremity   5/5  Lower extremity   5/5     Lower extremity   5/5 Tone and bulk:normal tone throughout; no atrophy noted Sensory: Pinprick and light touch decreased on the right side Deep Tendon Reflexes: 2+ and symmetric throughout Plantars: Right: downgoing   Left: downgoing Cerebellar: normal finger-to-nose, normal rapid alternating movements and normal heel-to-shin test Gait: not tested       Lab Results: Basic Metabolic Panel:  Recent Labs Lab 10/13/16 1438  NA 139  K 4.2  CL 107  GLUCOSE 96  BUN 7  CREATININE 0.50    Liver Function Tests: No results for input(s): AST, ALT, ALKPHOS, BILITOT, PROT, ALBUMIN in the last 168 hours. No results for input(s): LIPASE, AMYLASE in the last 168 hours. No results for input(s): AMMONIA in the last 168 hours.  CBC:  Recent Labs Lab 10/13/16 1427 10/13/16 1438  WBC 7.5  --   NEUTROABS 4.3  --   HGB 11.9* 13.3  HCT 37.3 39.0  MCV 87.8  --   PLT 263  --     Cardiac Enzymes: No results for input(s):  CKTOTAL, CKMB, CKMBINDEX, TROPONINI in the last 168 hours.  Lipid Panel: No results for input(s): CHOL, TRIG, HDL, CHOLHDL, VLDL, LDLCALC in the last 168 hours.  CBG: No results for input(s): GLUCAP in the last 168 hours.  Microbiology: Results for orders placed or performed during the hospital  encounter of 11/12/15  Wet prep, genital     Status: Abnormal   Collection Time: 11/12/15  6:54 AM  Result Value Ref Range Status   Yeast Wet Prep HPF POC NONE SEEN NONE SEEN Final   Trich, Wet Prep PRESENT (A) NONE SEEN Final   Clue Cells Wet Prep HPF POC NONE SEEN NONE SEEN Final   WBC, Wet Prep HPF POC MANY (A) NONE SEEN Final   Sperm NONE SEEN  Final    Coagulation Studies: No results for input(s): LABPROT, INR in the last 72 hours.  Imaging: Ct Head Code Stroke Wo Contrast  Result Date: 10/13/2016 CLINICAL DATA:  Code stroke. Headache with intermittent RIGHT-sided numbness and tingling in the arm and leg. History of migraine headaches. EXAM: CT HEAD WITHOUT CONTRAST TECHNIQUE: Contiguous axial images were obtained from the base of the skull through the vertex without intravenous contrast. COMPARISON:  10/19/2015. FINDINGS: Brain: No evidence of acute infarction, hemorrhage, hydrocephalus, extra-axial collection or mass lesion/mass effect. Normal cerebral volume. No white matter disease. Vascular: No hyperdense vessel or unexpected calcification. Skull: Normal. Negative for fracture or focal lesion. Sinuses/Orbits: Slight layering fluid in the RIGHT frontal sinus. Mild mucosal thickening in both ethmoid sinuses. Maxillary and sphenoid regions essentially clear. Other: None. ASPECTS ([place] Stroke Program Early CT Score) - Ganglionic level infarction (caudate, lentiform nuclei, internal capsule, insula, M1-M3 cortex): 7 - Supraganglionic infarction (M4-M6 cortex): 3 Total score (0-10 with 10 being normal): 10 IMPRESSION: *Negative for acute or focal intracranial findings, with mild chronic sinus  disease. *ASPECTS is 10. A call was placed to the stroke neurologist at 1443 hours, 10/13/2016. Electronically Signed   By: Staci Righter M.D.   On: 10/13/2016 14:44       Assessment and plan discussed with with attending physician and they are in agreement.    Etta Quill PA-C [facility] [phone]  10/13/2016, 2:48 PM   Assessment: 41 y.o. female with HA associated with MIGRAGTING numbness from shoulder to arm and leg. NIHSS 1 due to numbness. CT head is normal.   Stroke Risk Factors - hypertension  Recommend: # Migraine cocktail # BP control # STAT MRI Head w.o contrast  NEUROHOSPITALIST ADDENDUM Ms. Naomie Crow is a 41 year old female with history of migraines for over 10 years, untreated hypertension and current smoker presents to the ER after developing right sided numbness earlier today around 12:30. She was complaining of a headache prior to her sensory symptoms and describes it as a sensation that started from her shoulder and then slowly progressed down to her legs. Her symptoms lasted for about 20 minutes and her mother told her to call EMS. Her symptoms went away however return again while in the ER: This time she also had some facial numbness in addition to right arm and leg numbness. She also states that she feels slightly weaker on her right side. Her blood pressure was elevated at 371 systolic when EMS arrived and documented 696 systolic in the ER.  She describes her headache as a pressure on the right side of her forehead and is associated with nausea and photophobia. She states that she has been having increased frequency of headaches in the last 2 weeks occurring almost daily. They usually last for short periods of time and are not like her typical migraine headaches the last for a few hours and are much more infrequent.   She takes ibuprofen daily for headaches and uses it for treating her gout symptoms as well. She previously  had used Imitrex for the  treatment of her migraines over 1-1/2 years ago however stopped because she no longer has health insurance.  Examination she is subjective sensation over her right face arm and leg. She has a weaker finger grip in her right hand compared to her left, however no pronator drift. She also has slightly weaker right lower extremity strength however no drift. Her visual fields are normal, symmetric face. Normal coordination, normal reflexes.    Assessment and plan  The patient most likely has a complicated migraine and would benefit from a migraine cocktail. However given her vascular risk factors and the first time she is having focal symptoms, I think it is reasonable to obtain a limited MRI to rule out acute stroke. If negative, the patient can be discharged after counseling for smoking cessation, reduce caffeine intake, headache diary and blood pressure management.        Karena Addison Briauna Gilmartin MD [facility] [phone]  If 7pm to 7am, please call on call as listed on AMION.

## 2016-10-13 NOTE — ED Provider Notes (Signed)
[name] Provider Note   CSN: [id number] Arrival date & time: 10/13/16  1317   An emergency department physician performed an initial assessment on this suspected stroke patient at 1420.  History   Chief Complaint Chief Complaint  Patient presents with  . Headache    HPI Gina Shelton is a 41 y.o. female.  HPI  Pt with hx of cocaine use, HTN - uncontrolled and 1 ppd smoking comes in with cc of headaches x 2 weeks, with new numbness that started today. Headache is R sided, throbbing and sharp and pt has associated numbness to the entire R side, where she feels heavy as well. Pt has had migraines - but they were long time ago, and she has never had similar symptoms with it.   Past Medical History:  Diagnosis Date  . Anxiety   . Depression   . Depression   . GERD (gastroesophageal reflux disease)   . Gout    ble  . Migraine   . Migraine     Patient Active Problem List   Diagnosis Date Noted  . Major depressive disorder, single episode, severe without psychosis ([place]) 12/31/2015  . Major depressive disorder, recurrent severe without psychotic features ([place]) 12/31/2015  . Alcohol dependence ([place]) 06/07/2013  . Cocaine abuse 06/07/2013  . DEPRESSION, MAJOR, RECURRENT 08/17/2007  . ANXIETY STATE NOS 11/15/2006    Past Surgical History:  Procedure Laterality Date  . CESAREAN SECTION  infection at incission requiring return to OR x 2  . TUBAL LIGATION      OB History    Gravida Para Term Preterm AB Living   3 3 0 0 0     SAB TAB Ectopic Multiple Live Births   0 0 0           Home Medications    Prior to Admission medications   Medication Sig Start Date End Date Taking? Authorizing Provider  albuterol (PROVENTIL HFA;VENTOLIN HFA) 108 (90 Base) MCG/ACT inhaler Inhale 2 puffs into the lungs every 4 (four) hours as needed for wheezing or shortness of breath. 09/23/15  Yes Rogue Bussing, MD  aspirin EC 81 MG tablet Take 81 mg by mouth daily.    Yes [provider]  famotidine (PEPCID) 20 MG tablet Take 1 tablet (20 mg total) by mouth 2 (two) times daily. 01/14/16  Yes Carlota Raspberry, Tiffany, PA-C  gabapentin (NEURONTIN) 100 MG capsule Take 1 capsule (100 mg total) by mouth 3 (three) times daily. Patient taking differently: Take 100 mg by mouth 2 (two) times daily.  01/04/16  Yes Derrill Center, NP  hydrOXYzine (ATARAX/VISTARIL) 25 MG tablet Take 1 tablet (25 mg total) by mouth every 6 (six) hours as needed for anxiety. 01/04/16  Yes Derrill Center, NP  ibuprofen (ADVIL,MOTRIN) 200 MG tablet Take 200-800 mg by mouth every 6 (six) hours as needed for mild pain.   Yes [provider]  sucralfate (CARAFATE) 1 GM/10ML suspension Take 10 mLs (1 g total) by mouth 4 (four) times daily -  with meals and at bedtime. 01/14/16  Yes Carlota Raspberry, Tiffany, PA-C  White Petrolatum-Mineral Oil (STYE OP) Place 1-5 drops into both eyes daily as needed (pain).   Yes [provider]  amoxicillin (AMOXIL) 500 MG capsule Take 1 capsule (500 mg total) by mouth 3 (three) times daily. Patient not taking: Reported on 01/14/2016 01/04/16   Derrill Center, NP  amoxicillin-clavulanate (AUGMENTIN) 875-125 MG tablet Take 1 tablet by mouth every 12 (twelve) hours.  Patient not taking: Reported on 10/13/2016 08/30/16   Russo, [place] N, PA-C  erythromycin ophthalmic ointment Place a 1/2 inch ribbon of ointment into the lower eyelid, 4 times per day for 7 days. Patient not taking: Reported on 10/13/2016 08/30/16   Russo, [place] N, PA-C  lisinopril (PRINIVIL,ZESTRIL) 10 MG tablet Take 1 tablet (10 mg total) by mouth daily. 10/13/16   Varney Biles, MD  nicotine (NICODERM CQ - DOSED IN MG/24 HOURS) 21 mg/24hr patch Place 1 patch (21 mg total) onto the skin daily. Patient not taking: Reported on 01/14/2016 01/05/16   Derrill Center, NP  ondansetron (ZOFRAN) 4 MG tablet Take 1 tablet (4 mg total) by mouth every 6 (six) hours. Patient not taking: Reported on 10/13/2016  01/14/16   Delos Haring, PA-C  QUEtiapine (SEROQUEL) 100 MG tablet Take 1 tablet (100 mg total) by mouth at bedtime. Patient not taking: Reported on 08/30/2016 01/04/16   Derrill Center, NP    Family History Family History  Problem Relation Age of Onset  . Hypertension Mother   . Arthritis Sister   . Anesthesia problems Neg Hx   . Hypotension Neg Hx   . Malignant hyperthermia Neg Hx   . Pseudochol deficiency Neg Hx     Social History Social History  Substance Use Topics  . Smoking status: Current Every Day Smoker    Packs/day: 1.00    Years: 0.00    Types: Cigarettes  . Smokeless tobacco: Never Used     Comment: Currently using Nicotine patch  . Alcohol use 0.0 oz/week     Comment: occasionally- 2 drinks per week     Allergies   Patient has no known allergies.   Review of Systems Review of Systems  Constitutional: Positive for activity change.  Neurological: Positive for numbness and headaches.  All other systems reviewed and are negative.    Physical Exam Updated Vital Signs BP (!) 141/81   Pulse 80   Temp 98.3 F (36.8 C) (Oral)   Resp (!) 22   Ht 5' 2.5" (1.588 m)   Wt 90.7 kg (200 lb)   LMP 10/09/2016   SpO2 99%   BMI 36.00 kg/m   Physical Exam  Constitutional: She is oriented to person, place, and time. She appears well-developed and well-nourished.  HENT:  Head: Normocephalic and atraumatic.  Eyes: Pupils are equal, round, and reactive to light. EOM are normal.  Neck: Neck supple.  Cardiovascular: Normal rate, regular rhythm and normal heart sounds.   No murmur heard. Pulmonary/Chest: Effort normal. No respiratory distress.  Abdominal: Soft. She exhibits no distension. There is no tenderness. There is no rebound and no guarding.  Neurological: She is alert and oriented to person, place, and time.  Cerebellar exam is normal (finger to nose) patient is able to discriminate between sharp and dull, but pt has R sided hemiparesis Motor exam is  4+/5   Skin: Skin is warm and dry.  Nursing note and vitals reviewed.     ED Treatments / Results  Labs (all labs ordered are listed, but only abnormal results are displayed) Labs Reviewed  CBC - Abnormal; Notable for the following:       Result Value   Hemoglobin 11.9 (*)    RDW 17.5 (*)    All other components within normal limits  COMPREHENSIVE METABOLIC PANEL - Abnormal; Notable for the following:    Total Protein 6.4 (*)    All other components within normal limits  I-STAT CHEM 8,  ED - Abnormal; Notable for the following:    Calcium, Ion 1.09 (*)    All other components within normal limits  PROTIME-INR  APTT  DIFFERENTIAL  I-STAT TROPONIN, ED    EKG  EKG Interpretation None       Radiology Mr Brain Wo Contrast  Result Date: 10/13/2016 CLINICAL DATA:  41 y/o F; Two weeks of headaches with lightheadedness and dizziness. Intermittent right-sided numbness. Some shaking was noted by the family. EXAM: MRI HEAD WITHOUT CONTRAST TECHNIQUE: Multiplanar, multiecho pulse sequences of the brain and surrounding structures were obtained without intravenous contrast. COMPARISON:  10/13/2016 CT head. FINDINGS: Brain: No acute infarction, hemorrhage, hydrocephalus, extra-axial collection or mass lesion. Vascular: Normal flow voids. Skull and upper cervical spine: Normal marrow signal. Sinuses/Orbits: Mild diffuse paranasal sinus mucosal thickening. Orbits are unremarkable. No abnormal signal of mastoid air cells. Other: None. IMPRESSION: No acute intracranial abnormality identified. Mild paranasal sinus disease. Otherwise unremarkable MR brain. Electronically Signed   By: Kristine Garbe M.D.   On: 10/13/2016 19:18   Ct Head Code Stroke Wo Contrast  Result Date: 10/13/2016 CLINICAL DATA:  Code stroke. Headache with intermittent RIGHT-sided numbness and tingling in the arm and leg. History of migraine headaches. EXAM: CT HEAD WITHOUT CONTRAST TECHNIQUE: Contiguous axial images  were obtained from the base of the skull through the vertex without intravenous contrast. COMPARISON:  10/19/2015. FINDINGS: Brain: No evidence of acute infarction, hemorrhage, hydrocephalus, extra-axial collection or mass lesion/mass effect. Normal cerebral volume. No white matter disease. Vascular: No hyperdense vessel or unexpected calcification. Skull: Normal. Negative for fracture or focal lesion. Sinuses/Orbits: Slight layering fluid in the RIGHT frontal sinus. Mild mucosal thickening in both ethmoid sinuses. Maxillary and sphenoid regions essentially clear. Other: None. ASPECTS ([place] Stroke Program Early CT Score) - Ganglionic level infarction (caudate, lentiform nuclei, internal capsule, insula, M1-M3 cortex): 7 - Supraganglionic infarction (M4-M6 cortex): 3 Total score (0-10 with 10 being normal): 10 IMPRESSION: *Negative for acute or focal intracranial findings, with mild chronic sinus disease. *ASPECTS is 10. A call was placed to the stroke neurologist at 1443 hours, 10/13/2016. Electronically Signed   By: Staci Righter M.D.   On: 10/13/2016 14:44    Procedures Procedures (including critical care time)  Medications Ordered in ED Medications  ketorolac (TORADOL) 15 MG/ML injection 15 mg (15 mg Intravenous Given 10/13/16 1544)  metoCLOPramide (REGLAN) injection 10 mg (10 mg Intravenous Given 10/13/16 1544)     Initial Impression / Assessment and Plan / ED Course  I have reviewed the triage vital signs and the nursing notes.  Pertinent labs & imaging results that were available during my care of the patient were reviewed by me and considered in my medical decision making (see chart for details).     Pt comes in with cc of headaches and R sided numbness.  Pt has hx of 1ppd smoking and remote hx of cocaine use. Pt reports headaches x 2 weeks, with new R sided numbness that started this morning. Pt has subjective numbness to the RUE and RLE along with heaviness. Neuro consulted -  recommend MRI DWI.  In the interim - pt's headache has improved with toradol and reglan. Dr. Venora Maples will f.u.  Results from the ER workup discussed with the patient face to face and all questions answered to the best of my ability.  Pt will be started on lisinopril. We have given her info on [facility].  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

## 2016-10-13 NOTE — Discharge Instructions (Addendum)
°  We saw you in the ER for headaches. All the labs and imaging are normal. We suspect migraine headaches.  Please take motrin or tylenol round the clock for the next 6 hours, and take other meds prescribed only for break through pain. See your doctor if the pain persists, as you might need better medications or a specialist.  Prescribed is a BP medicine as well. It should be $10 or less at [facility] See [facility].

## 2016-10-13 NOTE — ED Notes (Signed)
Patient transported to MRI 

## 2016-10-13 NOTE — ED Triage Notes (Signed)
Per ems- she was sitting on couch, developed severe h/a, numbness down right arm and leg today at 1240. Ems arrived, numbness went away, feeling returning to right arm. No facial droop, arm drift, no slurred speech. Tingling to right hand at current. Hx of migraines. BP 150/110, HR 84, RR 18, 96% RA, CBG 88, 10/10 h/a.

## 2016-10-13 NOTE — ED Notes (Signed)
Dr. Lorraine Lax, neurology reports to cancel code stroke.

## 2016-10-13 NOTE — ED Notes (Signed)
Pt reports intermittent tingling to right arm and leg. None at this time. Dr. Kathrynn Humble made aware,will continue to monitor sx. There are no neuro deficits at this time.

## 2016-10-13 NOTE — ED Notes (Signed)
Patient verbalized understanding of discharge instructions and denies any further needs or questions at this time. VS stable. Patient ambulatory with steady gait. RN escorted to ED entrance.   

## 2016-10-13 NOTE — ED Notes (Signed)
Neuro at bedside.

## 2017-02-01 IMAGING — CR DG CHEST 2V
2 series · 2 of 2 positions shown · non-contrast
Comparison: 12/04/2013

CLINICAL DATA: Cough and chest pain

EXAM:
CHEST  2 VIEW

[chest lat]
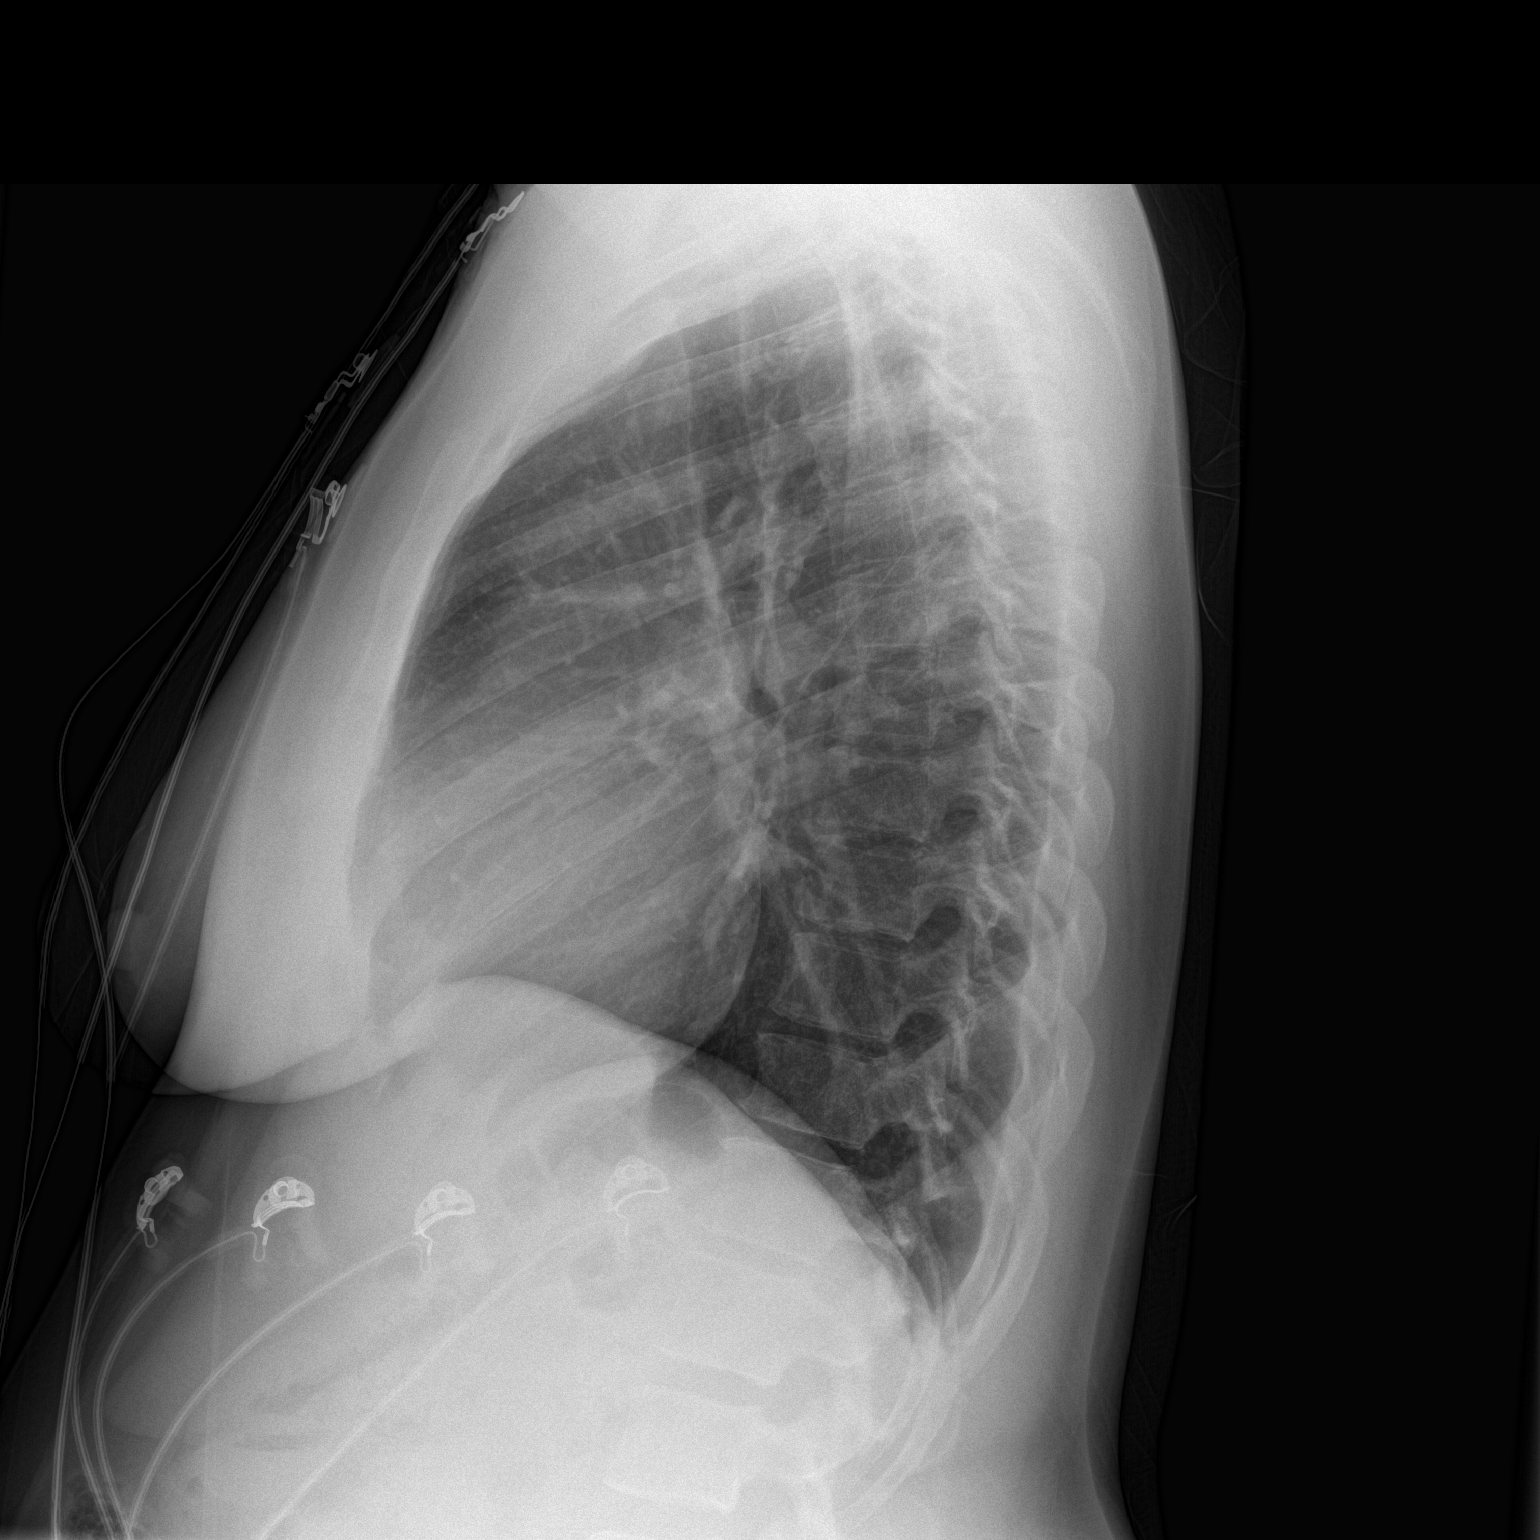

[chest ap]
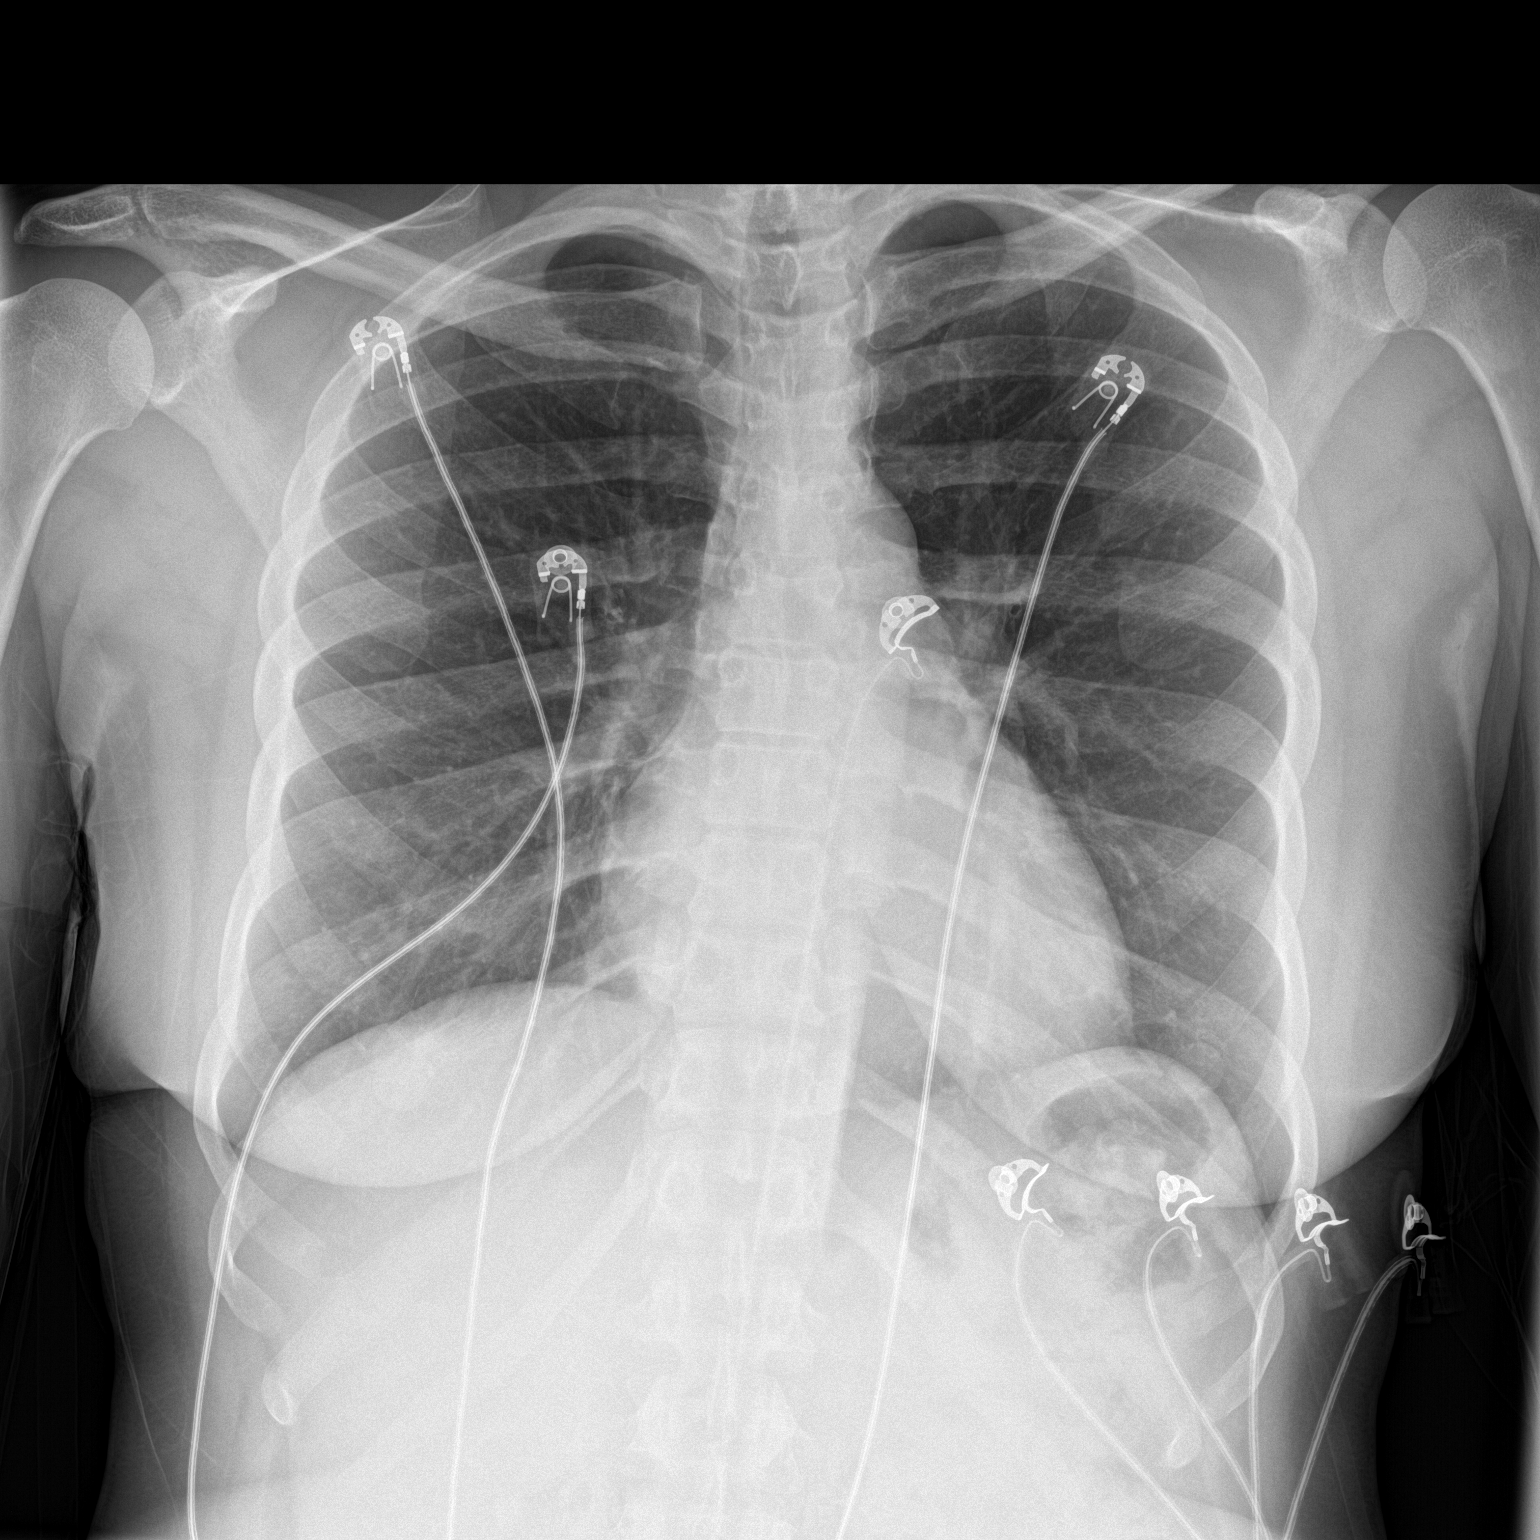

[2 of 2 positions shown; findings below may reference images not displayed]

FINDINGS: The heart size and mediastinal contours are within normal limits.
Both lungs are clear. The visualized skeletal structures are
unremarkable.
IMPRESSION: No active cardiopulmonary disease.

## 2017-03-30 ENCOUNTER — Other Ambulatory Visit: Payer: Self-pay

## 2017-03-30 ENCOUNTER — Emergency Department (HOSPITAL_COMMUNITY)
Admission: EM | Admit: 2017-03-30 | Discharge: 2017-03-30 | Disposition: A | Discharge status: Home / Self Care | Payer: Medicaid Other | Attending: Emergency Medicine | Admitting: Emergency Medicine

## 2017-03-30 ENCOUNTER — Emergency Department (HOSPITAL_COMMUNITY): Payer: Medicaid Other

## 2017-03-30 ENCOUNTER — Encounter (HOSPITAL_COMMUNITY): Payer: Self-pay | Admitting: Emergency Medicine

## 2017-03-30 DIAGNOSIS — Z79899 Other long term (current) drug therapy: Secondary | ICD-10-CM | POA: Diagnosis not present

## 2017-03-30 DIAGNOSIS — F17210 Nicotine dependence, cigarettes, uncomplicated: Secondary | ICD-10-CM | POA: Insufficient documentation

## 2017-03-30 DIAGNOSIS — J40 Bronchitis, not specified as acute or chronic: Secondary | ICD-10-CM | POA: Diagnosis not present

## 2017-03-30 DIAGNOSIS — R0602 Shortness of breath: Secondary | ICD-10-CM | POA: Diagnosis present

## 2017-03-30 DIAGNOSIS — R05 Cough: Secondary | ICD-10-CM

## 2017-03-30 DIAGNOSIS — R059 Cough, unspecified: Secondary | ICD-10-CM

## 2017-03-30 LAB — CBC
HEMATOCRIT: 38.4 % (ref 36.0–46.0)
HEMOGLOBIN: 12.5 g/dL (ref 12.0–15.0)
MCH: 29.8 pg (ref 26.0–34.0)
MCHC: 32.6 g/dL (ref 30.0–36.0)
MCV: 91.6 fL (ref 78.0–100.0)
Platelets: 267 10*3/uL (ref 150–400)
RBC: 4.19 MIL/uL (ref 3.87–5.11)
RDW: 15.1 % (ref 11.5–15.5)
WBC: 6.6 10*3/uL (ref 4.0–10.5)

## 2017-03-30 LAB — I-STAT TROPONIN, ED: Troponin i, poc: 0 ng/mL (ref 0.00–0.08)

## 2017-03-30 LAB — BASIC METABOLIC PANEL
ANION GAP: 11 (ref 5–15)
BUN: 7 mg/dL (ref 6–20)
CHLORIDE: 107 mmol/L (ref 101–111)
CO2: 20 mmol/L — AB (ref 22–32)
Calcium: 9.2 mg/dL (ref 8.9–10.3)
Creatinine, Ser: 0.66 mg/dL (ref 0.44–1.00)
GFR calc non Af Amer: >60 mL/min (ref >60)
Glucose, Bld: 90 mg/dL (ref 65–99)
Potassium: 4.2 mmol/L (ref 3.5–5.1)
Sodium: 138 mmol/L (ref 135–145)

## 2017-03-30 LAB — I-STAT BETA HCG BLOOD, ED (MC, WL, AP ONLY): I-stat hCG, quantitative: <5 m[IU]/mL (ref <5)

## 2017-03-30 MED ORDER — ALBUTEROL SULFATE HFA 108 (90 BASE) MCG/ACT IN AERS
2.0000 | INHALATION_SPRAY | Freq: Once | RESPIRATORY_TRACT | Status: AC
  Administered 2017-03-30: 2 via RESPIRATORY_TRACT
  Filled 2017-03-30: qty 6.7

## 2017-03-30 MED ORDER — PREDNISONE 20 MG PO TABS: 40.0000 mg | ORAL_TABLET | Freq: Every day | ORAL | 0 refills | Status: AC

## 2017-03-30 MED ORDER — ALBUTEROL SULFATE (2.5 MG/3ML) 0.083% IN NEBU: 5.0000 mg | INHALATION_SOLUTION | Freq: Once | RESPIRATORY_TRACT | Status: DC

## 2017-03-30 MED ORDER — ALBUTEROL SULFATE (2.5 MG/3ML) 0.083% IN NEBU
5.0000 mg | INHALATION_SOLUTION | Freq: Once | RESPIRATORY_TRACT | Status: AC
  Administered 2017-03-30: 5 mg via RESPIRATORY_TRACT
  Filled 2017-03-30: qty 6

## 2017-03-30 MED ORDER — PREDNISONE 20 MG PO TABS
60.0000 mg | ORAL_TABLET | Freq: Once | ORAL | Status: AC
  Administered 2017-03-30: 60 mg via ORAL
  Filled 2017-03-30: qty 3

## 2017-03-30 MED ORDER — IPRATROPIUM BROMIDE 0.02 % IN SOLN
0.5000 mg | Freq: Once | RESPIRATORY_TRACT | Status: AC
  Administered 2017-03-30: 0.5 mg via RESPIRATORY_TRACT
  Filled 2017-03-30: qty 2.5

## 2017-03-30 NOTE — ED Notes (Signed)
Pt ambulated to restroom with specimen cup for urine.

## 2017-03-30 NOTE — ED Triage Notes (Signed)
Pt arrives from home via [facility] c/o SOB with non productive cough x 3 weeks. Pt reports fever last week, afebrile in triage. Pt reports needing to use inhaler more frequently than normal. Pt speaking in full sentences, resp e/u, breath sounds clear.

## 2017-03-30 NOTE — ED Notes (Signed)
Patient ambulated in hallway with assistance. Patient states" that she had some increased shortness of breath while walking". Oxygen levels were 99% prior to ambulation and decreased to 96 % on room air. Pulse rate maintained in the mid to high 80's

## 2017-03-30 NOTE — ED Notes (Signed)
Pt states she understands instrcutions. Home stbl;e via wc with friend.

## 2017-03-30 NOTE — Discharge Instructions (Signed)
Evaluation today has been, shortness of breath and cough likely due to bronchitis, and this is likely chronic in nature due to your smoking history.  Please continue to use your albuterol inhaler every 4 hours for the next 24 hours and then needed, and take prednisone as directed for the next 5 days.  It is extremely important that you call today to schedule follow-up appointment with the [facility].  If you have worsening shortness of breath, chest pain, swelling or pain in your lower legs or other new or concerning symptoms please return to the ED for reevaluation.

## 2017-03-30 NOTE — ED Provider Notes (Signed)
[facility] EMERGENCY DEPARTMENT Provider Note   CSN: [id number] Arrival date & time: 03/30/17  0740     History   Chief Complaint Chief Complaint  Patient presents with  . Shortness of Breath    HPI  Gina Shelton is a 42 y.o. Female history of COPD, current tobacco use, migraines, gout, GERD, anxiety and depression, presents to the ED for evaluation of shortness of breath with a nonproductive cough for the past 3 weeks.  Patient reports a couple weeks ago she got what seems like an upper respiratory infection, but has had persistent lingering cough, with some intermittent wheezing at home, patient reports she has had to use her albuterol inhaler more frequently than normal.  Reports some low-grade fevers last week but none this week.  Has not followed up outpatient or been seen for this problem in the past 3 weeks.  Patient reports some intermittent chest pain as well as lateral rib pain, thinks this is just soreness from coughing but last night shortness of breath seemed to get worse, with some increased chest pain, so patient decided to come in for evaluation. Denies exogenous estrogen use, lower extremity pain or swelling, recent travel or immobilization, history of PE or DVT, family or personal history of bleeding or clotting disorders, or hemoptysis.  Patient reports despite these symptoms she continues to smoke at least a pack per day of cigarettes.  She does not have a primary doctor who she follows up with for management of her COPD.       Past Medical History:  Diagnosis Date  . Anxiety   . Depression   . Depression   . GERD (gastroesophageal reflux disease)   . Gout    ble  . Migraine   . Migraine     Patient Active Problem List   Diagnosis Date Noted  . Major depressive disorder, single episode, severe without psychosis ([place]) 12/31/2015  . 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 Alcohol  dependence ([place]) 06/07/2013  . Cocaine abuse ([place]) 06/07/2013  . DEPRESSION, MAJOR, RECURRENT 08/17/2007  . ANXIETY STATE NOS 11/15/2006    Past Surgical History:  Procedure Laterality Date  . CESAREAN SECTION  infection at incission requiring return to OR x 2  . TUBAL LIGATION      OB History    Gravida Para Term Preterm AB Living   3 3 0 0 0     SAB TAB Ectopic Multiple Live Births   0 0 0           Home Medications    Prior to Admission medications   Medication Sig Start Date End Date Taking? Authorizing Provider  albuterol (PROVENTIL HFA;VENTOLIN HFA) 108 (90 Base) MCG/ACT inhaler Inhale 2 puffs into the lungs every 4 (four) hours as needed for wheezing or shortness of breath. 09/23/15  Yes Rogue Bussing, MD  gabapentin (NEURONTIN) 100 MG capsule Take 1 capsule (100 mg total) by mouth 3 (three) times daily. Patient taking differently: Take 100 mg by mouth 2 (two) times daily.  01/04/16  Yes Derrill Center, NP  ibuprofen (ADVIL,MOTRIN) 200 MG tablet Take 200-800 mg by mouth every 6 (six) hours as needed for mild pain.   Yes [provider]  White Petrolatum-Mineral Oil (STYE OP) Place 1-5 drops into both eyes daily as needed (pain).   Yes [provider]  erythromycin ophthalmic ointment Place a 1/2 inch ribbon of ointment into the lower eyelid,  4 times per day for 7 days. Patient not taking: Reported on 10/13/2016 08/30/16   Robinson, [place] N, PA-C  famotidine (PEPCID) 20 MG tablet Take 1 tablet (20 mg total) by mouth 2 (two) times daily. Patient not taking: Reported on 03/30/2017 01/14/16   Delos Haring, PA-C  hydrOXYzine (ATARAX/VISTARIL) 25 MG tablet Take 1 tablet (25 mg total) by mouth every 6 (six) hours as needed for anxiety. Patient not taking: Reported on 03/30/2017 01/04/16   Derrill Center, NP  lisinopril (PRINIVIL,ZESTRIL) 10 MG tablet Take 1 tablet (10 mg total) by mouth daily. 10/13/16   Varney Biles, MD  nicotine (NICODERM CQ - DOSED  IN MG/24 HOURS) 21 mg/24hr patch Place 1 patch (21 mg total) onto the skin daily. Patient not taking: Reported on 01/14/2016 01/05/16   Derrill Center, NP  QUEtiapine (SEROQUEL) 100 MG tablet Take 1 tablet (100 mg total) by mouth at bedtime. Patient not taking: Reported on 08/30/2016 01/04/16   Derrill Center, NP  sucralfate (CARAFATE) 1 GM/10ML suspension Take 10 mLs (1 g total) by mouth 4 (four) times daily -  with meals and at bedtime. Patient not taking: Reported on 03/30/2017 01/14/16   Delos Haring, PA-C    Family History Family History  Problem Relation Age of Onset  . Hypertension Mother   . Arthritis Sister   . Anesthesia problems Neg Hx   . Hypotension Neg Hx   . Malignant hyperthermia Neg Hx   . Pseudochol deficiency Neg Hx     Social History Social History   Tobacco Use  . Smoking status: Current Every Day Smoker    Packs/day: 1.00    Years: 0.00    Pack years: 0.00    Types: Cigarettes  . Smokeless tobacco: Never Used  . Tobacco comment: Currently using Nicotine patch  Substance Use Topics  . Alcohol use: Yes    Alcohol/week: 0.0 oz    Comment: occasionally- 2 drinks per week  . Drug use: No     Allergies   Patient has no known allergies.   Review of Systems Review of Systems  Constitutional: Negative for chills and fever.  HENT: Negative for congestion, rhinorrhea and sore throat.   Eyes: Negative for discharge, redness and itching.  Respiratory: Positive for cough, chest tightness, shortness of breath and wheezing. Negative for stridor.   Cardiovascular: Negative for chest pain, palpitations and leg swelling.  Gastrointestinal: Negative for abdominal pain, nausea and vomiting.  Genitourinary: Negative for dysuria and frequency.  Musculoskeletal: Negative for arthralgias and myalgias.  Skin: Negative for color change and pallor.  Neurological: Negative for dizziness, weakness, light-headedness and headaches.     Physical Exam Updated Vital  Signs BP (!) 150/103   Pulse 84   Temp 98.6 F (37 C) (Oral)   Resp 20   LMP 03/11/2017 (Exact Date)   SpO2 100%   Physical Exam  Constitutional: She is oriented to person, place, and time. She appears well-developed and well-nourished. No distress.  Patient appears much older than stated age  HENT:  Head: Normocephalic and atraumatic.  Eyes: Right eye exhibits no discharge. Left eye exhibits no discharge.  Neck: Neck supple.  Cardiovascular: Normal rate, regular rhythm, normal heart sounds and intact distal pulses.  Pulmonary/Chest: Effort normal. No respiratory distress.  Normal work of breathing, respirations are equal and unlabored, lungs with scattered end expiratory wheezes throughout both lung fields, and patient sounds tight with decreased length of expiration, no rhonchi or rails, patient is able to  speak in full sentences  Abdominal: Soft. Bowel sounds are normal. She exhibits no distension and no mass. There is no tenderness. There is no guarding.  Musculoskeletal: She exhibits no edema or deformity.  Bilateral lower extremities without edema or tenderness  Neurological: She is alert and oriented to person, place, and time. Coordination normal.  Speech is clear, able to follow commands CN III-XII intact Normal strength in upper and lower extremities bilaterally including dorsiflexion and plantar flexion, strong and equal grip strength Sensation normal to light and sharp touch Moves extremities without ataxia, coordination intact  Skin: Skin is warm and dry. Capillary refill takes less than 2 seconds. She is not diaphoretic.  Psychiatric: She has a normal mood and affect. Her behavior is normal.  Nursing note and vitals reviewed.    ED Treatments / Results  Labs (all labs ordered are listed, but only abnormal results are displayed) Labs Reviewed  BASIC METABOLIC PANEL - Abnormal; Notable for the following components:      Result Value   CO2 20 (*)    All other  components within normal limits  CBC  I-STAT BETA HCG BLOOD, ED ([facility], [facility], [facility] ONLY)  I-STAT TROPONIN, ED    EKG  EKG Interpretation  Date/Time:  Thursday March 30 2017 07:37:35 EST Ventricular Rate:  76 PR Interval:  138 QRS Duration: 94 QT Interval:  370 QTC Calculation: 416 R Axis:   18 Text Interpretation:  Normal sinus rhythm Normal ECG Confirmed by Fredia Sorrow [phone]) on 03/30/2017 11:27:25 AM       Radiology Dg Chest 2 View  Result Date: 03/30/2017 CLINICAL DATA:  Cough. EXAM: CHEST  2 VIEW COMPARISON:  Radiographs of January 14, 2016. FINDINGS: The heart size and mediastinal contours are within normal limits. Both lungs are clear. No pneumothorax or pleural effusion is noted. The visualized skeletal structures are unremarkable. IMPRESSION: No active cardiopulmonary disease. Electronically Signed   By: Marijo Conception, M.D.   On: 03/30/2017 08:31    Procedures Procedures (including critical care time)  Medications Ordered in ED Medications  albuterol (PROVENTIL) (2.5 MG/3ML) 0.083% nebulizer solution 5 mg (5 mg Nebulization Given 03/30/17 1029)  ipratropium (ATROVENT) nebulizer solution 0.5 mg (0.5 mg Nebulization Given 03/30/17 1029)  predniSONE (DELTASONE) tablet 60 mg (60 mg Oral Given 03/30/17 1201)  albuterol (PROVENTIL HFA;VENTOLIN HFA) 108 (90 Base) MCG/ACT inhaler 2 puff (2 puffs Inhalation Given 03/30/17 1349)     Initial Impression / Assessment and Plan / ED Course  I have reviewed the triage vital signs and the nursing notes.  Pertinent labs & imaging results that were available during my care of the patient were reviewed by me and considered in my medical decision making (see chart for details).  Patient presents for evaluation of persistent cough and shortness of breath.  Patient is a chronic smoker and currently smokes 1 pack/day, initially had URI symptoms all resolved aside from persistent cough and patient has had worsening shortness of breath.   Denies chest pain. Denies exogenous estrogen use, lower extremity pain or swelling, recent travel or immobilization, history of PE or DVT, family or personal history of bleeding or clotting disorders, or hemoptysis.  On exam patient is mildly hypertensive, she has been diagnosed with hypertension but is not currently taking any blood pressure medications, vitals otherwise normal, no tachycardia, tachypnea or hypoxia.  On exam patient has some scattered end expiratory wheezes, and lungs overall sound tight, will give nebulizer treatment and steroids and reevaluate.  [facility]  evaluation has been reassuring chest x-ray shows no evidence of active cardiopulmonary disease, no leukocytosis and hemoglobin normal, no electrolyte derangements requiring intervention, troponin negative and EKG without remarkable changes.  No suggestion for acute cardiac process, and patient is PERC negative.  Patient able to ambulate in hallway and maintained O2 sats above 96, pulse rate remained in the 80s, patient does endorse some increased shortness of breath for walking but able to walk and talk without difficulty.  At this time patient is stable for discharge home, albuterol inhaler provided here in the ED as well as prescription for 5-day burst of steroids.  Patient instructed to use inhaler every 4 hours for the next 24 hours and.  Stressed importance of patient following up with community health and wellness clinic for chronic management of her COPD, and counseled patient on smoking cessation.  Strict return precautions provided.  Patient expresses understanding and is in agreement with plan.  Discussed with Dr. Rogene Houston who is in agreement with plan  Final Clinical Impressions(s) / ED Diagnoses   Final diagnoses:  Shortness of breath  Cough  Bronchitis    ED Discharge Orders        Ordered    predniSONE (DELTASONE) 20 MG tablet  Daily     03/30/17 1340       Benedetto Goad [place], [place] 03/31/17 [id number]    Fredia Sorrow, MD 04/01/17 [phone]

## 2017-05-21 ENCOUNTER — Emergency Department (HOSPITAL_COMMUNITY): Payer: Medicaid Other

## 2017-05-21 ENCOUNTER — Encounter (HOSPITAL_COMMUNITY): Payer: Self-pay | Admitting: Emergency Medicine

## 2017-05-21 ENCOUNTER — Emergency Department (HOSPITAL_COMMUNITY)
Admission: EM | Admit: 2017-05-21 | Discharge: 2017-05-21 | Disposition: A | Discharge status: Home / Self Care | Payer: Medicaid Other | Attending: Emergency Medicine | Admitting: Emergency Medicine

## 2017-05-21 DIAGNOSIS — K6289 Other specified diseases of anus and rectum: Secondary | ICD-10-CM | POA: Insufficient documentation

## 2017-05-21 DIAGNOSIS — N888 Other specified noninflammatory disorders of cervix uteri: Secondary | ICD-10-CM

## 2017-05-21 DIAGNOSIS — R102 Pelvic and perineal pain: Secondary | ICD-10-CM | POA: Diagnosis not present

## 2017-05-21 DIAGNOSIS — Z79899 Other long term (current) drug therapy: Secondary | ICD-10-CM | POA: Insufficient documentation

## 2017-05-21 DIAGNOSIS — F17210 Nicotine dependence, cigarettes, uncomplicated: Secondary | ICD-10-CM | POA: Diagnosis not present

## 2017-05-21 LAB — URINALYSIS, ROUTINE W REFLEX MICROSCOPIC
BACTERIA UA: NONE SEEN
BILIRUBIN URINE: NEGATIVE
Glucose, UA: NEGATIVE mg/dL
KETONES UR: NEGATIVE mg/dL
Nitrite: NEGATIVE
Protein, ur: NEGATIVE mg/dL
Specific Gravity, Urine: 1.021 (ref 1.005–1.030)
pH: 6 (ref 5.0–8.0)

## 2017-05-21 LAB — CBC
HEMATOCRIT: 38.8 % (ref 36.0–46.0)
Hemoglobin: 12.5 g/dL (ref 12.0–15.0)
MCH: 30.1 pg (ref 26.0–34.0)
MCHC: 32.2 g/dL (ref 30.0–36.0)
MCV: 93.5 fL (ref 78.0–100.0)
Platelets: 231 10*3/uL (ref 150–400)
RBC: 4.15 MIL/uL (ref 3.87–5.11)
RDW: 16.1 % — AB (ref 11.5–15.5)
WBC: 6.4 10*3/uL (ref 4.0–10.5)

## 2017-05-21 LAB — LIPASE, BLOOD: LIPASE: 27 U/L (ref 11–51)

## 2017-05-21 LAB — COMPREHENSIVE METABOLIC PANEL
ALT: 15 U/L (ref 14–54)
AST: 21 U/L (ref 15–41)
Albumin: 3.6 g/dL (ref 3.5–5.0)
Alkaline Phosphatase: 114 U/L (ref 38–126)
Anion gap: 9 (ref 5–15)
BUN: 5 mg/dL — AB (ref 6–20)
CHLORIDE: 105 mmol/L (ref 101–111)
CO2: 22 mmol/L (ref 22–32)
CREATININE: 0.56 mg/dL (ref 0.44–1.00)
Calcium: 8.8 mg/dL — ABNORMAL LOW (ref 8.9–10.3)
GFR calc Af Amer: >60 mL/min (ref >60)
GFR calc non Af Amer: >60 mL/min (ref >60)
Glucose, Bld: 122 mg/dL — ABNORMAL HIGH (ref 65–99)
POTASSIUM: 3.6 mmol/L (ref 3.5–5.1)
SODIUM: 136 mmol/L (ref 135–145)
Total Bilirubin: 0.3 mg/dL (ref 0.3–1.2)
Total Protein: 6.9 g/dL (ref 6.5–8.1)

## 2017-05-21 LAB — I-STAT BETA HCG BLOOD, ED (MC, WL, AP ONLY)

## 2017-05-21 MED ORDER — KETOROLAC TROMETHAMINE 30 MG/ML IJ SOLN
30.0000 mg | Freq: Once | INTRAMUSCULAR | Status: AC
  Administered 2017-05-21: 30 mg via INTRAVENOUS
  Filled 2017-05-21: qty 1

## 2017-05-21 MED ORDER — ACETAMINOPHEN 500 MG PO TABS
1000.0000 mg | ORAL_TABLET | Freq: Once | ORAL | Status: AC
Start: 2017-05-21 — End: 2017-05-21
  Administered 2017-05-21: 1000 mg via ORAL
  Filled 2017-05-21: qty 2

## 2017-05-21 MED ORDER — IOPAMIDOL (ISOVUE-300) INJECTION 61%
INTRAVENOUS | Status: AC
  Administered 2017-05-21: 100 mL
  Filled 2017-05-21: qty 100

## 2017-05-21 NOTE — ED Triage Notes (Signed)
Pt states 4 days of abdominal pain, generalized, with constipation. Pt also states itching to her rectum with some bleeding when she tries to have a BM. Pt states "I get constipated a lot"

## 2017-05-21 NOTE — ED Provider Notes (Signed)
[facility] EMERGENCY DEPARTMENT Provider Note   CSN: [id number] Arrival date & time: 05/21/17  1825     History   Chief Complaint Chief Complaint  Patient presents with  . Abdominal Pain  . Constipation    HPI Gina Shelton is a 42 y.o. female.  HPI 42 year old female who presents the emergency department with complaints of rectal pain and constipation.  She states her last bowel movement was 4 days ago.  She reports pain with movement of her bowels.  She states some bleeding noted when she attempts to have a bowel movement.  She also reports itching of her rectum.  She has a history of recurrent constipation.  She normally takes MiraLAX.  She has not tried it at this time.  She reports irregular menstrual cycle.  She denies vaginal discharge.  She denies pain with intercourse.  Denies significant lower abdominal discomfort.   Past Medical History:  Diagnosis Date  . Anxiety   . Depression   . Depression   . GERD (gastroesophageal reflux disease)   . Gout    ble  . Migraine   . Migraine     Patient Active Problem List   Diagnosis Date Noted  . Major depressive disorder, single episode, severe without psychosis ([place]) 12/31/2015  . Major depressive disorder, recurrent severe without psychotic features ([place]) 12/31/2015  . Alcohol dependence ([place]) 06/07/2013  . Cocaine abuse ([place]) 06/07/2013  . DEPRESSION, MAJOR, RECURRENT 08/17/2007  . ANXIETY STATE NOS 11/15/2006    Past Surgical History:  Procedure Laterality Date  . CESAREAN SECTION  infection at incission requiring return to OR x 2  . TUBAL LIGATION      OB History    Gravida Para Term Preterm AB Living   3 3 0 0 0     SAB TAB Ectopic Multiple Live Births   0 0 0           Home Medications    Prior to Admission medications   Medication Sig Start Date End Date Taking? Authorizing Provider  albuterol (PROVENTIL HFA;VENTOLIN HFA) 108 (90 Base) MCG/ACT inhaler Inhale 2 puffs into the  lungs every 4 (four) hours as needed for wheezing or shortness of breath. 09/23/15  Yes Rogue Bussing, MD  gabapentin (NEURONTIN) 100 MG capsule Take 1 capsule (100 mg total) by mouth 3 (three) times daily. Patient taking differently: Take 100 mg by mouth as needed.  01/04/16  Yes Derrill Center, NP  ibuprofen (ADVIL,MOTRIN) 200 MG tablet Take 200-800 mg by mouth every 6 (six) hours as needed for mild pain.   Yes [provider]  erythromycin ophthalmic ointment Place a 1/2 inch ribbon of ointment into the lower eyelid, 4 times per day for 7 days. Patient not taking: Reported on 10/13/2016 08/30/16   Robinson, [place] N, PA-C  famotidine (PEPCID) 20 MG tablet Take 1 tablet (20 mg total) by mouth 2 (two) times daily. Patient not taking: Reported on 03/30/2017 01/14/16   Delos Haring, PA-C  hydrOXYzine (ATARAX/VISTARIL) 25 MG tablet Take 1 tablet (25 mg total) by mouth every 6 (six) hours as needed for anxiety. Patient not taking: Reported on 03/30/2017 01/04/16   Derrill Center, NP  lisinopril (PRINIVIL,ZESTRIL) 10 MG tablet Take 1 tablet (10 mg total) by mouth daily. Patient not taking: Reported on 05/21/2017 10/13/16   Varney Biles, MD  nicotine (NICODERM CQ - DOSED IN MG/24 HOURS) 21 mg/24hr patch Place 1 patch (21 mg total) onto the skin  daily. Patient not taking: Reported on 01/14/2016 01/05/16   Derrill Center, NP  QUEtiapine (SEROQUEL) 100 MG tablet Take 1 tablet (100 mg total) by mouth at bedtime. Patient not taking: Reported on 08/30/2016 01/04/16   Derrill Center, NP  sucralfate (CARAFATE) 1 GM/10ML suspension Take 10 mLs (1 g total) by mouth 4 (four) times daily -  with meals and at bedtime. Patient not taking: Reported on 03/30/2017 01/14/16   Delos Haring, PA-C  White Petrolatum-Mineral Oil (STYE OP) Place 1-5 drops into both eyes daily as needed (pain).    [provider]    Family History Family History  Problem Relation Age of Onset  . Hypertension  Mother   . Arthritis Sister   . Anesthesia problems Neg Hx   . Hypotension Neg Hx   . Malignant hyperthermia Neg Hx   . Pseudochol deficiency Neg Hx     Social History Social History   Tobacco Use  . Smoking status: Current Every Day Smoker    Packs/day: 1.00    Years: 0.00    Pack years: 0.00    Types: Cigarettes  . Smokeless tobacco: Never Used  . Tobacco comment: Currently using Nicotine patch  Substance Use Topics  . Alcohol use: Yes    Alcohol/week: 0.0 oz    Comment: occasionally- 2 drinks per week  . Drug use: No     Allergies   Patient has no known allergies.   Review of Systems Review of Systems  All other systems reviewed and are negative.    Physical Exam Updated Vital Signs BP 129/84 (BP Location: Right Arm)   Pulse 74   Temp 98.8 F (37.1 C) (Oral)   Resp 16   Ht 5' 2.5" (1.588 m)   Wt 90.3 kg (199 lb)   LMP 05/07/2017   SpO2 98%   BMI 35.82 kg/m   Physical Exam  Constitutional: She is oriented to person, place, and time. She appears well-developed and well-nourished. No distress.  HENT:  Head: Normocephalic and atraumatic.  Eyes: EOM are normal.  Neck: Normal range of motion.  Cardiovascular: Normal rate, regular rhythm and normal heart sounds.  Pulmonary/Chest: Effort normal and breath sounds normal.  Abdominal: Soft. She exhibits no distension. There is no tenderness.  Genitourinary:  Genitourinary Comments: No rectal bleeding.  No external hemorrhoids.  No palpable internal hemorrhoids.  No stool palpated.  Vaginal exam deferred  Musculoskeletal: Normal range of motion.  Neurological: She is alert and oriented to person, place, and time.  Skin: Skin is warm and dry.  Psychiatric: She has a normal mood and affect. Judgment normal.  Nursing note and vitals reviewed.    ED Treatments / Results  Labs (all labs ordered are listed, but only abnormal results are displayed) Labs Reviewed  COMPREHENSIVE METABOLIC PANEL - Abnormal;  Notable for the following components:      Result Value   Glucose, Bld 122 (*)    BUN 5 (*)    Calcium 8.8 (*)    All other components within normal limits  CBC - Abnormal; Notable for the following components:   RDW 16.1 (*)    All other components within normal limits  URINALYSIS, ROUTINE W REFLEX MICROSCOPIC - Abnormal; Notable for the following components:   APPearance HAZY (*)    Hgb urine dipstick SMALL (*)    Leukocytes, UA TRACE (*)    Squamous Epithelial / LPF 6-30 (*)    All other components within normal limits  LIPASE,  BLOOD  I-STAT BETA HCG BLOOD, ED ([facility], [facility], [facility] ONLY)    EKG  EKG Interpretation None       Radiology Ct Abdomen Pelvis W Contrast  Result Date: 05/21/2017 CLINICAL DATA:  Acute onset of mid to lower abdominal pain. Pus and blood from the rectum. Lack of bowel movements. EXAM: CT ABDOMEN AND PELVIS WITH CONTRAST TECHNIQUE: Multidetector CT imaging of the abdomen and pelvis was performed using the standard protocol following bolus administration of intravenous contrast. CONTRAST:  142mL ISOVUE-300 IOPAMIDOL (ISOVUE-300) INJECTION 61% COMPARISON:  Pelvic ultrasound, and CT of the abdomen and pelvis performed 11/12/2015 FINDINGS: Lower chest: The visualized lung bases are grossly clear. The visualized portions of the mediastinum are unremarkable. 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 The gallbladder is grossly unremarkable. A Phrygian cap is noted. The common bile duct remains normal in caliber. Pancreas: The pancreas is within normal limits. Spleen: The spleen is unremarkable in appearance. Adrenals/Urinary Tract: The adrenal glands are unremarkable in appearance. There is incomplete rotation of the right kidney. There is no evidence of hydronephrosis. No renal or ureteral stones are identified. No perinephric stranding is seen. Stomach/Bowel: The stomach is  unremarkable in appearance. The small bowel is within normal limits. The appendix is normal in caliber, without evidence of appendicitis. The colon is unremarkable in appearance. Vascular/Lymphatic: The abdominal aorta is unremarkable in appearance. The inferior vena cava is grossly unremarkable. No retroperitoneal lymphadenopathy is seen. No pelvic sidewall lymphadenopathy is identified. Reproductive: There appears to be a mildly heterogeneous 6.3 x 5.0 x 6.3 cm mass at the left side of the cervix, extending into surrounding soft tissues. This is concerning for primary cervical malignancy. The remainder of the uterus is grossly unremarkable. The mass abuts the left ovary. The right ovary is unremarkable in appearance. Other: Mild soft tissue inflammation is noted within the pelvis. Musculoskeletal: No acute osseous abnormalities are identified. The visualized musculature is unremarkable in appearance. IMPRESSION: 1. Mildly heterogeneous 6.3 cm mass at the left side of the cervix, concerning for primary cervical malignancy. The mass abuts the left ovary. Mild surrounding soft tissue inflammation noted within the pelvis. No definite lymphadenopathy identified at this time. 2. 9 cm giant cavernous hemangioma within the liver has increased in size from prior studies. These results were called by telephone at the time of interpretation on 05/21/2017 at 9:47 pm to Dr. Jola Schmidt, who verbally acknowledged these results. Electronically Signed   By: Garald Balding M.D.   On: 05/21/2017 21:50    Procedures Procedures (including critical care time)  Medications Ordered in ED Medications  ketorolac (TORADOL) 30 MG/ML injection 30 mg (30 mg Intravenous Given 05/21/17 2046)  acetaminophen (TYLENOL) tablet 1,000 mg (1,000 mg Oral Given 05/21/17 2039)  iopamidol (ISOVUE-300) 61 % injection (100 mLs  Contrast Given 05/21/17 2122)     Initial Impression / Assessment and Plan / ED Course  I have reviewed the triage  vital signs and the nursing notes.  Pertinent labs & imaging results that were available during my care of the patient were reviewed by me and considered in my medical decision making (see chart for details).     Patient will be discharged home with MiraLAX.  No clear etiology for her rectal pain.  She will need outpatient primary care and possible GI follow-up for this.  She was incidentally found to have a 6 cm cervical mass concerning for cervical  cancer.  She will need direct visualization of this by a gynecologist.  I referred her to the [facility] gynecology clinic.  I do not feel like direct visualization will change her disposition from the emergency department.  This can be performed as an outpatient.  She understands the possibility of cancer and understands the importance of close follow-up.  This information was provided to the patient and the patient's daughter.  All questions answered.  Final Clinical Impressions(s) / ED Diagnoses   Final diagnoses:  Rectal pain  Cervical mass    ED Discharge Orders    None       Jola Schmidt, MD 05/21/17 2308

## 2017-05-21 NOTE — ED Notes (Signed)
Patient able to ambulate independently  

## 2017-05-21 NOTE — ED Notes (Signed)
Patient to CT.

## 2017-05-22 ENCOUNTER — Inpatient Hospital Stay (HOSPITAL_COMMUNITY): Payer: Medicaid Other

## 2017-05-22 ENCOUNTER — Encounter (HOSPITAL_COMMUNITY): Payer: Self-pay

## 2017-05-22 ENCOUNTER — Inpatient Hospital Stay (HOSPITAL_COMMUNITY)
Admission: AD | Admit: 2017-05-22 | Discharge: 2017-05-22 | Disposition: A | Discharge status: Home / Self Care | Payer: Medicaid Other | Source: Ambulatory Visit | Attending: Obstetrics & Gynecology | Admitting: Obstetrics & Gynecology

## 2017-05-22 DIAGNOSIS — F419 Anxiety disorder, unspecified: Secondary | ICD-10-CM | POA: Insufficient documentation

## 2017-05-22 DIAGNOSIS — F329 Major depressive disorder, single episode, unspecified: Secondary | ICD-10-CM | POA: Diagnosis not present

## 2017-05-22 DIAGNOSIS — R1907 Generalized intra-abdominal and pelvic swelling, mass and lump: Secondary | ICD-10-CM | POA: Diagnosis not present

## 2017-05-22 DIAGNOSIS — Z79899 Other long term (current) drug therapy: Secondary | ICD-10-CM | POA: Insufficient documentation

## 2017-05-22 DIAGNOSIS — D1803 Hemangioma of intra-abdominal structures: Secondary | ICD-10-CM

## 2017-05-22 DIAGNOSIS — M109 Gout, unspecified: Secondary | ICD-10-CM | POA: Insufficient documentation

## 2017-05-22 DIAGNOSIS — F17210 Nicotine dependence, cigarettes, uncomplicated: Secondary | ICD-10-CM | POA: Insufficient documentation

## 2017-05-22 DIAGNOSIS — E669 Obesity, unspecified: Secondary | ICD-10-CM

## 2017-05-22 DIAGNOSIS — N888 Other specified noninflammatory disorders of cervix uteri: Secondary | ICD-10-CM

## 2017-05-22 DIAGNOSIS — K219 Gastro-esophageal reflux disease without esophagitis: Secondary | ICD-10-CM | POA: Insufficient documentation

## 2017-05-22 DIAGNOSIS — N83209 Unspecified ovarian cyst, unspecified side: Secondary | ICD-10-CM

## 2017-05-22 DIAGNOSIS — K5900 Constipation, unspecified: Secondary | ICD-10-CM | POA: Insufficient documentation

## 2017-05-22 DIAGNOSIS — E66811 Obesity, class 1: Secondary | ICD-10-CM

## 2017-05-22 DIAGNOSIS — K5909 Other constipation: Secondary | ICD-10-CM

## 2017-05-22 LAB — URINALYSIS, ROUTINE W REFLEX MICROSCOPIC
BILIRUBIN URINE: NEGATIVE
GLUCOSE, UA: NEGATIVE mg/dL
KETONES UR: NEGATIVE mg/dL
Nitrite: NEGATIVE
Protein, ur: NEGATIVE mg/dL
Specific Gravity, Urine: 1.024 (ref 1.005–1.030)
pH: 6 (ref 5.0–8.0)

## 2017-05-22 LAB — WET PREP, GENITAL
SPERM: NONE SEEN
Trich, Wet Prep: NONE SEEN
YEAST WET PREP: NONE SEEN

## 2017-05-22 NOTE — MAU Provider Note (Signed)
History     CSN: [id number]  Arrival date and time: 05/22/17 1147  Seen by provider at [facility] Complaint  Patient presents with  . Constipation  . Rectal Pain   HPI Gina Shelton 42 y.o.  Client comes in with rectal pain and unable to have a BM.  Was at the ER yesterday and told to use Miralax.  Has not yet used this as she says it feels like something will fall out if she pushes.  Had  CT scan yesterday and was found to have a cervical mass and mass in her liver.  Notes and CT results reviewed.  Client reports having pap smear done 2 years ago.  No pap results found on chart.   OB History    Gravida Para Term Preterm AB Living   3 3 0 0 0     SAB TAB Ectopic Multiple Live Births   0 0 0          Past Medical History:  Diagnosis Date  . Anxiety   . Depression   . Depression   . GERD (gastroesophageal reflux disease)   . Gout    ble  . Migraine   . Migraine     Past Surgical History:  Procedure Laterality Date  . CESAREAN SECTION  infection at incission requiring return to OR x 2  . TUBAL LIGATION      Family History  Problem Relation Age of Onset  . Hypertension Mother   . Arthritis Sister   . Anesthesia problems Neg Hx   . Hypotension Neg Hx   . Malignant hyperthermia Neg Hx   . Pseudochol deficiency Neg Hx     Social History   Tobacco Use  . Smoking status: Current Every Day Smoker    Packs/day: 1.00    Years: 0.00    Pack years: 0.00    Types: Cigarettes  . Smokeless tobacco: Never Used  . Tobacco comment: Currently using Nicotine patch  Substance Use Topics  . Alcohol use: Yes    Alcohol/week: 0.0 oz    Comment: 1 beer daily  . Drug use: No    Allergies: No Known Allergies  Medications Prior to Admission  Medication Sig Dispense Refill Last Dose  . albuterol (PROVENTIL HFA;VENTOLIN HFA) 108 (90 Base) MCG/ACT inhaler Inhale 2 puffs into the lungs every 4 (four) hours as needed for wheezing or shortness of breath. 1 Inhaler 0  unknown at prn  . erythromycin ophthalmic ointment Place a 1/2 inch ribbon of ointment into the lower eyelid, 4 times per day for 7 days. (Patient not taking: Reported on 10/13/2016) 3.5 g 0 Not Taking at Unknown time  . famotidine (PEPCID) 20 MG tablet Take 1 tablet (20 mg total) by mouth 2 (two) times daily. (Patient not taking: Reported on 03/30/2017) 30 tablet 0 Not Taking at Unknown time  . gabapentin (NEURONTIN) 100 MG capsule Take 1 capsule (100 mg total) by mouth 3 (three) times daily. (Patient taking differently: Take 100 mg by mouth as needed. ) 90 capsule 0 05/19/2017  . hydrOXYzine (ATARAX/VISTARIL) 25 MG tablet Take 1 tablet (25 mg total) by mouth every 6 (six) hours as needed for anxiety. (Patient not taking: Reported on 03/30/2017) 30 tablet 0 Not Taking at Unknown time  . ibuprofen (ADVIL,MOTRIN) 200 MG tablet Take 200-800 mg by mouth every 6 (six) hours as needed for mild pain.   05/21/2017 at Unknown time  . lisinopril (PRINIVIL,ZESTRIL) 10 MG tablet  Take 1 tablet (10 mg total) by mouth daily. (Patient not taking: Reported on 05/21/2017) 60 tablet 1 Not Taking at Unknown time  . nicotine (NICODERM CQ - DOSED IN MG/24 HOURS) 21 mg/24hr patch Place 1 patch (21 mg total) onto the skin daily. (Patient not taking: Reported on 01/14/2016) 28 patch 0 Not Taking at Unknown time  . QUEtiapine (SEROQUEL) 100 MG tablet Take 1 tablet (100 mg total) by mouth at bedtime. (Patient not taking: Reported on 08/30/2016) 30 tablet 0 Not Taking at Unknown time  . sucralfate (CARAFATE) 1 GM/10ML suspension Take 10 mLs (1 g total) by mouth 4 (four) times daily -  with meals and at bedtime. (Patient not taking: Reported on 03/30/2017) 420 mL 0 Not Taking at Unknown time  . White Petrolatum-Mineral Oil (STYE OP) Place 1-5 drops into both eyes daily as needed (pain).   Past Month at Unknown time    Review of Systems  Constitutional: Negative for fever.  Gastrointestinal: Positive for abdominal pain, constipation and  rectal pain. Negative for diarrhea.  Genitourinary: Negative for dysuria, vaginal bleeding and vaginal discharge.   Physical Exam   Blood pressure (!) 139/91, pulse 87, temperature 98.6 F (37 C), temperature source Oral, resp. rate 15, height 5' 2.5" (1.588 m), weight 192 lb (87.1 kg), last menstrual period 05/07/2017, SpO2 99 %.  Physical Exam  Nursing note and vitals reviewed. Constitutional: She is oriented to person, place, and time. She appears well-developed and well-nourished.  HENT:  Head: Normocephalic.  Eyes: EOM are normal.  Neck: Neck supple.  GI: Soft. There is no tenderness. There is no rebound and no guarding.  Genitourinary:  Genitourinary Comments: Speculum exam: Vagina - Small amount of white discharge, no odor Cervix - difficult to visualize   - to client's left Bimanual exam: Cervix closed - feels like a normal multiparous cervix Uterus tender, unable to size due to habitus Adnexa non tender, no masses palpated GC/Chlam, wet prep done Chaperone present for exam.   Musculoskeletal: Normal range of motion.  Neurological: She is alert and oriented to person, place, and time.  Skin: Skin is warm and dry.  Psychiatric: She has a normal mood and affect.    MAU Course  Procedures Results for orders placed or performed during the hospital encounter of 05/22/17 (from the past 24 hour(s))  Urinalysis, Routine w reflex microscopic     Status: Abnormal   Collection Time: 05/22/17 12:01 PM  Result Value Ref Range   Color, Urine YELLOW YELLOW   APPearance HAZY (A) CLEAR   Specific Gravity, Urine 1.024 1.005 - 1.030   pH 6.0 5.0 - 8.0   Glucose, UA NEGATIVE NEGATIVE mg/dL   Hgb urine dipstick SMALL (A) NEGATIVE   Bilirubin Urine NEGATIVE NEGATIVE   Ketones, ur NEGATIVE NEGATIVE mg/dL   Protein, ur NEGATIVE NEGATIVE mg/dL   Nitrite NEGATIVE NEGATIVE   Leukocytes, UA SMALL (A) NEGATIVE   RBC / HPF 0-5 0 - 5 RBC/hpf   WBC, UA 0-5 0 - 5 WBC/hpf   Bacteria, UA RARE  (A) NONE SEEN   Squamous Epithelial / LPF 6-30 (A) NONE SEEN   Mucus PRESENT   Wet prep, genital     Status: Abnormal   Collection Time: 05/22/17 12:30 PM  Result Value Ref Range   Yeast Wet Prep HPF POC NONE SEEN NONE SEEN   Trich, Wet Prep NONE SEEN NONE SEEN   Clue Cells Wet Prep HPF POC PRESENT (A) NONE SEEN   WBC, Wet  Prep HPF POC FEW (A) NONE SEEN   Sperm NONE SEEN     MDM CLINICAL DATA:  Cervical mass - seen on CT scan.  EXAM: TRANSABDOMINAL AND TRANSVAGINAL ULTRASOUND OF PELVIS  TECHNIQUE: Both transabdominal and transvaginal ultrasound examinations of the pelvis were performed. Transabdominal technique was performed for global imaging of the pelvis including uterus, ovaries, adnexal regions, and pelvic cul-de-sac. It was necessary to proceed with endovaginal exam following the transabdominal exam to visualize the cervix and endometrial complex.  COMPARISON:  CT dated 05/21/2017  FINDINGS: Uterus  Measurements: 9.3 x 5.9 x 6.8 cm. No fibroids or other mass visualized.  Endometrium  Thickness: 13 mm.  No focal abnormality visualized.  Right ovary  Measurements: 3.4 x 3 x 3 cm.  Normal appearance/no adnexal mass.  Left ovary  Measurements: 5 x 2.1 x 3.2 cm.  Normal appearance/no adnexal mass.  Other findings  No abnormal free fluid.  IMPRESSION: 1. The masslike structure identified at the left side of the cervix on CT of 05/21/2017, concerning for primary cervical malignancy, could not be reproduced on today's ultrasound, possibly related to the uterine lie. If clinical findings are indeterminate, would consider MRI of the uterus/cervix for definitive imaging characterization. 2. Both ovaries appear normal. No mass or free fluid identified in either adnexal region. 3. Uterus appears unremarkable on today's ultrasound. No endometrial mass or endometrial thickening.  Consult with Dr. Harolyn Rutherford and discussed the plan of  care.  Assessment and Plan  Constipation Unresolved if client has a cervical mass - seen on CT but not seen on pelvic ultrasound Liver hemangioma  Plan You need to have a MRI to evaluate the mass seen on your cervix during the CT scan.  It was not seen today on ultrasound.  You need the MRI to make sure that nothing is missed and to confirm the results of the other 2 procedures.  [facility] will call to schedule the MRI.  Also you need to see a GI specialist as your liver has a hemangioma and it needs to be followed by a specialized MD.  Will ask the clinic to call you to schedule an appointment in 4-6 weeks so the results of the MRI, CT scan and Ultrasound can be further discussed with you.  Go ahead and take the miralax daily to see if your symptoms  Gina Shelton 05/22/2017, 1:28 PM

## 2017-05-22 NOTE — MAU Note (Signed)
Pt reports she was seen at [facility] ED yesterday for constipation and pain that radiates to her rectum. Pain is constant. Today states the pain much worse.

## 2017-05-22 NOTE — Discharge Instructions (Signed)
You need to have a MRI to evaluate the mass seen on your cervix during the CT scan.  It was not seen today on ultrasound.  You need the MRI to make sure that nothing is missed and to confirm the results of the other 2 procedures.  [facility] will call to schedule the MRI.  Also you need to see a GI specialist as your liver has a hemangioma and it needs to be followed by a specialized MD.  Will ask the clinic to call you to schedule an appointment in 4-6 weeks so the results of the MRI, CT scan and Ultrasound can be further discussed with you.  Go ahead and take the miralax daily to see if your symptoms

## 2017-05-23 LAB — RPR: RPR: NONREACTIVE

## 2017-05-23 LAB — GC/CHLAMYDIA PROBE AMP (~~LOC~~) NOT AT ARMC
CHLAMYDIA, DNA PROBE: NEGATIVE
NEISSERIA GONORRHEA: NEGATIVE

## 2017-05-23 LAB — HIV ANTIBODY (ROUTINE TESTING W REFLEX): HIV Screen 4th Generation wRfx: NONREACTIVE

## 2017-05-26 ENCOUNTER — Other Ambulatory Visit: Payer: Self-pay | Admitting: Obstetrics & Gynecology

## 2017-05-29 ENCOUNTER — Ambulatory Visit
Admission: RE | Admit: 2017-05-29 | Discharge: 2017-05-29 | Disposition: A | Discharge status: Home / Self Care | Payer: Medicaid Other | Source: Ambulatory Visit | Attending: Obstetrics & Gynecology | Admitting: Obstetrics & Gynecology

## 2017-05-29 DIAGNOSIS — R1907 Generalized intra-abdominal and pelvic swelling, mass and lump: Secondary | ICD-10-CM

## 2017-05-29 MED ORDER — GADOBENATE DIMEGLUMINE 529 MG/ML IV SOLN
18.0000 mL | Freq: Once | INTRAVENOUS | Status: AC | PRN
  Administered 2017-05-29: 18 mL via INTRAVENOUS

## 2017-05-31 ENCOUNTER — Telehealth: Payer: Self-pay

## 2017-05-31 NOTE — Telephone Encounter (Signed)
Called pt to inform her of her results. Per Dr. Harolyn Rutherford Normal MRI and pelvic ultrasound, no cervical mass seen as reported on recent CT scan. If she has any GYN concerns or no recent pap, can be scheduled to see any provider. Pt states that she is having some itching and discharge x 4 days. Pt is scheduled for an appt on 06/19/17. Advised pt to call front office to see if her appt can can be changed if her symptoms persists.Pt verbalized understanding.

## 2017-06-13 ENCOUNTER — Inpatient Hospital Stay (HOSPITAL_COMMUNITY)
Admission: AD | Admit: 2017-06-13 | Discharge: 2017-06-13 | Discharge status: Against Medical Advice | Payer: Medicaid Other | Source: Ambulatory Visit | Attending: Obstetrics and Gynecology | Admitting: Obstetrics and Gynecology

## 2017-06-13 ENCOUNTER — Other Ambulatory Visit: Payer: Self-pay

## 2017-06-13 DIAGNOSIS — R109 Unspecified abdominal pain: Secondary | ICD-10-CM | POA: Diagnosis present

## 2017-06-13 DIAGNOSIS — Z5321 Procedure and treatment not carried out due to patient leaving prior to being seen by health care provider: Secondary | ICD-10-CM | POA: Diagnosis not present

## 2017-06-13 LAB — URINALYSIS, ROUTINE W REFLEX MICROSCOPIC
Bilirubin Urine: NEGATIVE
GLUCOSE, UA: NEGATIVE mg/dL
KETONES UR: NEGATIVE mg/dL
Nitrite: NEGATIVE
Protein, ur: NEGATIVE mg/dL
Specific Gravity, Urine: 1.008 (ref 1.005–1.030)
pH: 7 (ref 5.0–8.0)

## 2017-06-13 LAB — POCT PREGNANCY, URINE: Preg Test, Ur: NEGATIVE

## 2017-06-13 NOTE — Progress Notes (Signed)
Pt left prior to being seen ?

## 2017-06-13 NOTE — MAU Note (Signed)
Pt presents with c/o abdominal, lower back, and anal pain.  Pt reports pain started approx 1 week ago.  States anus feels numb, and has used bathroom on self twice (BM) without knowing. LMP 06/04/2017

## 2017-06-20 ENCOUNTER — Other Ambulatory Visit (HOSPITAL_COMMUNITY)
Admission: RE | Admit: 2017-06-20 | Discharge: 2017-06-20 | Disposition: A | Discharge status: Home / Self Care | Payer: Medicaid Other | Source: Ambulatory Visit | Attending: Obstetrics & Gynecology | Admitting: Obstetrics & Gynecology

## 2017-06-20 ENCOUNTER — Ambulatory Visit (INDEPENDENT_AMBULATORY_CARE_PROVIDER_SITE_OTHER): Payer: Medicaid Other | Admitting: Obstetrics and Gynecology

## 2017-06-20 ENCOUNTER — Telehealth: Payer: Self-pay

## 2017-06-20 ENCOUNTER — Encounter: Payer: Self-pay | Admitting: Obstetrics and Gynecology

## 2017-06-20 VITALS — BP 151/102 | HR 84 | Wt 184.6 lb

## 2017-06-20 DIAGNOSIS — Z0000 Encounter for general adult medical examination without abnormal findings: Secondary | ICD-10-CM | POA: Diagnosis not present

## 2017-06-20 DIAGNOSIS — Z01419 Encounter for gynecological examination (general) (routine) without abnormal findings: Secondary | ICD-10-CM | POA: Insufficient documentation

## 2017-06-20 DIAGNOSIS — D1803 Hemangioma of intra-abdominal structures: Secondary | ICD-10-CM

## 2017-06-20 NOTE — Progress Notes (Signed)
Subjective:     Gina Shelton is a 42 y.o. female and is here for a comprehensive physical exam. The patient reports normal monthly period lasting 5 days. She is sexually active using BTL for contraception. She reports being informed of trich exposure. Patient also complaints of stool incontinence for the past week. She states that it happens 2-3 times per week. She denies any pelvic pain or abnormal discharge  Past Medical History:  Diagnosis Date  . Anxiety   . Depression   . Depression   . GERD (gastroesophageal reflux disease)   . Gout    ble  . Migraine   . Migraine    Past Surgical History:  Procedure Laterality Date  . CESAREAN SECTION  infection at incission requiring return to OR x 2  . TUBAL LIGATION     Family History  Problem Relation Age of Onset  . Hypertension Mother   . Arthritis Sister   . Anesthesia problems Neg Hx   . Hypotension Neg Hx   . Malignant hyperthermia Neg Hx   . Pseudochol deficiency Neg Hx     Social History   Socioeconomic History  . Marital status: Single    Spouse name: Not on file  . Number of children: Not on file  . Years of education: Not on file  . Highest education level: Not on file  Occupational History  . Not on file  Social Needs  . Financial resource strain: Not on file  . Food insecurity:    Worry: Not on file    Inability: Not on file  . Transportation needs:    Medical: Not on file    Non-medical: Not on file  Tobacco Use  . Smoking status: Current Every Day Smoker    Packs/day: 1.00    Years: 0.00    Pack years: 0.00    Types: Cigarettes  . Smokeless tobacco: Never Used  . Tobacco comment: Currently using Nicotine patch  Substance and Sexual Activity  . Alcohol use: Yes    Alcohol/week: 0.0 oz    Comment: 1 beer daily  . Drug use: No  . Sexual activity: Yes  Lifestyle  . Physical activity:    Days per week: Not on file    Minutes per session: Not on file  . Stress: Not on file  Relationships  .  Social connections:    Talks on phone: Not on file    Gets together: Not on file    Attends religious service: Not on file    Active member of club or organization: Not on file    Attends meetings of clubs or organizations: Not on file    Relationship status: Not on file  . Intimate partner violence:    Fear of current or ex partner: Not on file    Emotionally abused: Not on file    Physically abused: Not on file    Forced sexual activity: Not on file  Other Topics Concern  . Not on file  Social History Narrative   ** Merged History Encounter **       Health Maintenance  Topic Date Due  . PAP SMEAR  10/13/2006  . INFLUENZA VACCINE  10/12/2017  . TETANUS/TDAP  07/30/2025  . HIV Screening  Completed       Review of Systems Pertinent items are noted in HPI.   Objective:  Blood pressure (!) 151/102, pulse 84, weight 184 lb 9.6 oz (83.7 kg), last menstrual period 06/04/2017.  GENERAL: Well-developed, well-nourished female in no acute distress.  HEENT: Normocephalic, atraumatic. Sclerae anicteric.  NECK: Supple. Normal thyroid.  LUNGS: Clear to auscultation bilaterally.  HEART: Regular rate and rhythm. BREASTS: Symmetric in size. No palpable masses or lymphadenopathy, skin changes, or nipple drainage. ABDOMEN: Soft, nontender, nondistended. No organomegaly. PELVIC: Normal external female genitalia. Vagina is pink and rugated.  Normal discharge. Normal appearing cervix. Uterus is normal in size. No adnexal mass or tenderness. EXTREMITIES: No cyanosis, clubbing, or edema, 2+ distal pulses.    Assessment:    Healthy female exam.      Plan:    Pap smear with STI screen performed Patient will be contacted with any abnormal results Referral made to GI to follow up on liver hemangioma and stool incontinence.  See After Visit Summary for Counseling Recommendations

## 2017-06-20 NOTE — Telephone Encounter (Signed)
Called pt to inform her of her appt scheduled for June 27, 2017 at 2:15pm at Time. Pt verbalized understanding and had no questions.

## 2017-06-20 NOTE — Progress Notes (Signed)
Pt c/o lower abd pain and rectal pain with occasional bowel incontinence. Would like to be checked for stds, patient got a text from her partner saying he had trichomonas.

## 2017-06-21 LAB — CYTOLOGY - PAP
Bacterial vaginitis: POSITIVE — AB
CHLAMYDIA, DNA PROBE: NEGATIVE
Candida vaginitis: NEGATIVE
Diagnosis: NEGATIVE
HPV (WINDOPATH): NOT DETECTED
Neisseria Gonorrhea: NEGATIVE
TRICH (WINDOWPATH): NEGATIVE

## 2017-06-21 LAB — RPR: RPR Ser Ql: NONREACTIVE

## 2017-06-21 LAB — HEPATITIS B SURFACE ANTIGEN: Hepatitis B Surface Ag: NEGATIVE

## 2017-06-21 LAB — HEPATITIS C ANTIBODY: Hep C Virus Ab: <0.1 s/co ratio (ref 0.0–0.9)

## 2017-06-21 LAB — HIV ANTIBODY (ROUTINE TESTING W REFLEX): HIV SCREEN 4TH GENERATION: NONREACTIVE

## 2017-06-22 MED ORDER — METRONIDAZOLE 500 MG PO TABS: 500.0000 mg | ORAL_TABLET | Freq: Two times a day (BID) | ORAL | 0 refills | Status: DC

## 2017-06-22 NOTE — Addendum Note (Signed)
Addended by: Mora Bellman on: 06/22/2017 02:16 PM   Modules accepted: Orders

## 2017-06-26 ENCOUNTER — Telehealth: Payer: Self-pay

## 2017-06-26 NOTE — Telephone Encounter (Signed)
-----   Message from Mora Bellman, MD sent at 06/22/2017  2:16 PM EDT ----- Please inform patient of BV infection. Rx has been e-prescribed

## 2017-06-26 NOTE — Telephone Encounter (Signed)
Called pt to inform her that she tested positive for BV.Medication was sent to patient's pharmacy. Pt verbalized understanding and had no questions.

## 2017-08-23 ENCOUNTER — Encounter: Payer: Self-pay | Admitting: Obstetrics and Gynecology

## 2017-09-26 ENCOUNTER — Emergency Department (HOSPITAL_COMMUNITY): Payer: Medicaid Other

## 2017-09-26 ENCOUNTER — Other Ambulatory Visit: Payer: Self-pay

## 2017-09-26 ENCOUNTER — Encounter (HOSPITAL_COMMUNITY): Payer: Self-pay

## 2017-09-26 ENCOUNTER — Observation Stay (HOSPITAL_COMMUNITY)
Admission: EM | Admit: 2017-09-26 | Discharge: 2017-09-27 | Disposition: A | Discharge status: Home / Self Care | Payer: Medicaid Other | Attending: Internal Medicine | Admitting: Internal Medicine

## 2017-09-26 DIAGNOSIS — F1910 Other psychoactive substance abuse, uncomplicated: Secondary | ICD-10-CM | POA: Diagnosis not present

## 2017-09-26 DIAGNOSIS — Z8249 Family history of ischemic heart disease and other diseases of the circulatory system: Secondary | ICD-10-CM | POA: Diagnosis not present

## 2017-09-26 DIAGNOSIS — Z6831 Body mass index (BMI) 31.0-31.9, adult: Secondary | ICD-10-CM | POA: Diagnosis not present

## 2017-09-26 DIAGNOSIS — E669 Obesity, unspecified: Secondary | ICD-10-CM | POA: Diagnosis not present

## 2017-09-26 DIAGNOSIS — R0789 Other chest pain: Principal | ICD-10-CM | POA: Insufficient documentation

## 2017-09-26 DIAGNOSIS — I160 Hypertensive urgency: Secondary | ICD-10-CM | POA: Diagnosis not present

## 2017-09-26 DIAGNOSIS — M109 Gout, unspecified: Secondary | ICD-10-CM | POA: Diagnosis not present

## 2017-09-26 DIAGNOSIS — K219 Gastro-esophageal reflux disease without esophagitis: Secondary | ICD-10-CM | POA: Diagnosis not present

## 2017-09-26 DIAGNOSIS — F17210 Nicotine dependence, cigarettes, uncomplicated: Secondary | ICD-10-CM | POA: Diagnosis not present

## 2017-09-26 DIAGNOSIS — R079 Chest pain, unspecified: Secondary | ICD-10-CM

## 2017-09-26 DIAGNOSIS — I10 Essential (primary) hypertension: Secondary | ICD-10-CM

## 2017-09-26 DIAGNOSIS — I5030 Unspecified diastolic (congestive) heart failure: Secondary | ICD-10-CM | POA: Insufficient documentation

## 2017-09-26 DIAGNOSIS — F1411 Cocaine abuse, in remission: Secondary | ICD-10-CM | POA: Insufficient documentation

## 2017-09-26 DIAGNOSIS — I110 Hypertensive heart disease with heart failure: Secondary | ICD-10-CM | POA: Diagnosis not present

## 2017-09-26 DIAGNOSIS — Z9851 Tubal ligation status: Secondary | ICD-10-CM | POA: Insufficient documentation

## 2017-09-26 DIAGNOSIS — Z79899 Other long term (current) drug therapy: Secondary | ICD-10-CM | POA: Diagnosis not present

## 2017-09-26 DIAGNOSIS — Z9889 Other specified postprocedural states: Secondary | ICD-10-CM | POA: Insufficient documentation

## 2017-09-26 LAB — RAPID URINE DRUG SCREEN, HOSP PERFORMED
Amphetamines: NOT DETECTED
Benzodiazepines: NOT DETECTED
COCAINE: NOT DETECTED
OPIATES: NOT DETECTED
TETRAHYDROCANNABINOL: POSITIVE — AB

## 2017-09-26 LAB — BASIC METABOLIC PANEL
ANION GAP: 9 (ref 5–15)
BUN: 7 mg/dL (ref 6–20)
CO2: 23 mmol/L (ref 22–32)
Calcium: 9.1 mg/dL (ref 8.9–10.3)
Chloride: 107 mmol/L (ref 98–111)
Creatinine, Ser: 0.75 mg/dL (ref 0.44–1.00)
GFR calc Af Amer: >60 mL/min (ref >60)
GFR calc non Af Amer: >60 mL/min (ref >60)
Glucose, Bld: 95 mg/dL (ref 70–99)
Potassium: 4.1 mmol/L (ref 3.5–5.1)
SODIUM: 139 mmol/L (ref 135–145)

## 2017-09-26 LAB — I-STAT TROPONIN, ED
TROPONIN I, POC: 0 ng/mL (ref 0.00–0.08)
Troponin i, poc: 0 ng/mL (ref 0.00–0.08)

## 2017-09-26 LAB — CBC
HEMATOCRIT: 39.8 % (ref 36.0–46.0)
Hemoglobin: 12.5 g/dL (ref 12.0–15.0)
MCH: 30.3 pg (ref 26.0–34.0)
MCHC: 31.4 g/dL (ref 30.0–36.0)
MCV: 96.4 fL (ref 78.0–100.0)
Platelets: 289 10*3/uL (ref 150–400)
RBC: 4.13 MIL/uL (ref 3.87–5.11)
RDW: 14.5 % (ref 11.5–15.5)
WBC: 7 10*3/uL (ref 4.0–10.5)

## 2017-09-26 LAB — D-DIMER, QUANTITATIVE: D-Dimer, Quant: 0.48 ug/mL-FEU (ref 0.00–0.50)

## 2017-09-26 LAB — I-STAT BETA HCG BLOOD, ED (MC, WL, AP ONLY): I-stat hCG, quantitative: <5 m[IU]/mL (ref <5)

## 2017-09-26 MED ORDER — ONDANSETRON HCL 4 MG PO TABS: 4.0000 mg | ORAL_TABLET | Freq: Four times a day (QID) | ORAL | Status: DC | PRN

## 2017-09-26 MED ORDER — IBUPROFEN 200 MG PO TABS
800.0000 mg | ORAL_TABLET | Freq: Three times a day (TID) | ORAL | Status: DC | PRN
Start: 2017-09-26 — End: 2017-09-27
  Administered 2017-09-27: 800 mg via ORAL
  Filled 2017-09-26: qty 4

## 2017-09-26 MED ORDER — AMLODIPINE BESYLATE 5 MG PO TABS: 10.0000 mg | ORAL_TABLET | Freq: Every day | ORAL | Status: DC

## 2017-09-26 MED ORDER — ACETAMINOPHEN 500 MG PO TABS
1000.0000 mg | ORAL_TABLET | Freq: Once | ORAL | Status: AC
  Administered 2017-09-26: 1000 mg via ORAL
  Filled 2017-09-26: qty 2

## 2017-09-26 MED ORDER — SODIUM CHLORIDE 0.9% FLUSH
3.0000 mL | Freq: Two times a day (BID) | INTRAVENOUS | Status: DC
Start: 2017-09-26 — End: 2017-09-27
  Administered 2017-09-26: 3 mL via INTRAVENOUS

## 2017-09-26 MED ORDER — ONDANSETRON HCL 4 MG/2ML IJ SOLN: 4.0000 mg | Freq: Four times a day (QID) | INTRAMUSCULAR | Status: DC | PRN

## 2017-09-26 MED ORDER — AMLODIPINE BESYLATE 10 MG PO TABS
10.0000 mg | ORAL_TABLET | Freq: Every day | ORAL | Status: DC
  Administered 2017-09-26 – 2017-09-27 (×2): 10 mg via ORAL
  Filled 2017-09-26: qty 2
  Filled 2017-09-26: qty 1

## 2017-09-26 MED ORDER — HYDRALAZINE HCL 20 MG/ML IJ SOLN: 10.0000 mg | INTRAMUSCULAR | Status: DC | PRN

## 2017-09-26 MED ORDER — ACETAMINOPHEN 325 MG PO TABS: 650.0000 mg | ORAL_TABLET | Freq: Four times a day (QID) | ORAL | Status: DC | PRN

## 2017-09-26 MED ORDER — ACETAMINOPHEN 650 MG RE SUPP: 650.0000 mg | Freq: Four times a day (QID) | RECTAL | Status: DC | PRN

## 2017-09-26 MED ORDER — HYDRALAZINE HCL 20 MG/ML IJ SOLN
10.0000 mg | Freq: Once | INTRAMUSCULAR | Status: AC
  Administered 2017-09-26: 10 mg via INTRAVENOUS
  Filled 2017-09-26: qty 1

## 2017-09-26 MED ORDER — MORPHINE SULFATE (PF) 4 MG/ML IV SOLN
4.0000 mg | Freq: Once | INTRAVENOUS | Status: AC
  Administered 2017-09-26: 4 mg via INTRAVENOUS
  Filled 2017-09-26: qty 1

## 2017-09-26 MED ORDER — IPRATROPIUM-ALBUTEROL 0.5-2.5 (3) MG/3ML IN SOLN: 3.0000 mL | RESPIRATORY_TRACT | Status: DC | PRN

## 2017-09-26 MED ORDER — HYDRALAZINE HCL 20 MG/ML IJ SOLN: 10.0000 mg | Freq: Once | INTRAMUSCULAR | Status: DC

## 2017-09-26 MED ORDER — ENOXAPARIN SODIUM 40 MG/0.4ML ~~LOC~~ SOLN
40.0000 mg | SUBCUTANEOUS | Status: DC
  Filled 2017-09-26: qty 0.4

## 2017-09-26 MED ORDER — LORATADINE 10 MG PO TABS
10.0000 mg | ORAL_TABLET | Freq: Every day | ORAL | Status: DC
  Filled 2017-09-26: qty 1

## 2017-09-26 NOTE — ED Notes (Signed)
PA aware of bp 

## 2017-09-26 NOTE — H&P (Signed)
History and Physical    Gina Shelton ALP:[id number] DOB: 02-26-1976 DOA: 09/26/2017  Referring MD/NP/PA:  Jhonnie Garner PCP: Patient, No Pcp Per  Patient coming from: Home   Chief Complaint: Chest pain  I have personally briefly reviewed patient's old medical records in [place]   HPI: Gina Shelton is a 42 y.o. female with medical history significant of anxiety, depression, migraine headaches, and GERD; who resents with complaints of 2 weeks of chest pain.  Pain is described as sharp and located on across both sides of her chest with radiation to her back with inspiration.  Patient reports trying to utilize ibuprofen and Tylenol without relief of symptoms.  Heavy breathing or movements worsen symptoms.  Associated symptoms include complaints of nausea, sweats, mild shortness of breath, subjective fevers, seasonal allergies, and unchanged chronic dry cough which she relates to issues history of smoking.  Patient reports that she has cut back smoking down to half a pack of cigarettes per day.  Patient reports having chest wall pain previously in the past, but reports never being this severe.   ED Course: Upon admission into the emergency department patient was seen to have blood pressures elevated up to 195/127.  Lab work was relatively unremarkable including 2 negative troponins.  Chest x-ray did not show any acute abnormalities patient was given hydralazine, morphine, and tylenol.  TRH called to admit for hypertensive urgency.   Review of Systems  Constitutional: Positive for diaphoresis, fever and malaise/fatigue. Negative for chills.  HENT: Negative for ear discharge and ear pain.   Eyes: Negative for photophobia and pain.  Respiratory: Positive for cough and shortness of breath. Negative for sputum production.   Cardiovascular: Positive for chest pain. Negative for leg swelling.  Gastrointestinal: Positive for nausea. Negative for abdominal pain, blood in stool and vomiting.    Genitourinary: Negative for dysuria and frequency.  Musculoskeletal: Positive for myalgias.  Skin: Negative for itching and rash.  Neurological: Negative for focal weakness and loss of consciousness.  Endo/Heme/Allergies: Positive for environmental allergies.  Psychiatric/Behavioral: Negative for memory loss and substance abuse.    Past Medical History:  Diagnosis Date  . Anxiety   . Depression   . Depression   . GERD (gastroesophageal reflux disease)   . Gout    ble  . Migraine   . Migraine     Past Surgical History:  Procedure Laterality Date  . CESAREAN SECTION  infection at incission requiring return to OR x 2  . TUBAL LIGATION       reports that she has been smoking cigarettes.  She has been smoking about 1.00 pack per day for the past 0.00 years. She has never used smokeless tobacco. She reports that she drinks alcohol. She reports that she does not use drugs.  No Known Allergies  Family History  Problem Relation Age of Onset  . Hypertension Mother   . Arthritis Sister   . Anesthesia problems Neg Hx   . Hypotension Neg Hx   . Malignant hyperthermia Neg Hx   . Pseudochol deficiency Neg Hx     Prior to Admission medications   Medication Sig Start Date End Date Taking? Authorizing Provider  acetaminophen (TYLENOL) 500 MG tablet Take 500 mg by mouth every 6 (six) hours as needed for mild pain, moderate pain, fever or headache.   Yes [provider]  albuterol (PROVENTIL HFA;VENTOLIN HFA) 108 (90 Base) MCG/ACT inhaler Inhale 2 puffs into the lungs every 4 (four) hours as needed  for wheezing or shortness of breath. 09/23/15  Yes Rogue Bussing, MD  ibuprofen (ADVIL,MOTRIN) 200 MG tablet Take 800 mg by mouth every 6 (six) hours as needed for mild pain.    Yes [provider]  gabapentin (NEURONTIN) 100 MG capsule Take 1 capsule (100 mg total) by mouth 3 (three) times daily. Patient not taking: Reported on 06/20/2017 01/04/16   Derrill Center,  NP    Physical Exam:  Constitutional: NAD, calm, comfortable Vitals:   09/26/17 2000 09/26/17 2033 09/26/17 2045 09/26/17 2100  BP: (!) 175/111 (!) 194/109 (!) 179/97 (!) 195/127  Pulse: 62 (!) 59 61 61  Resp: 17 18 20 17   Temp:      TempSrc:      SpO2: 100% 100% 100% 100%   Eyes: PERRL, lids and conjunctivae normal ENMT: Mucous membranes are moist. Posterior pharynx clear of any exudate or lesions.Normal dentition.  Neck: normal, supple, no masses, no thyromegaly Respiratory: clear to auscultation bilaterally, no wheezing, no crackles. Normal respiratory effort. No accessory muscle use.  Cardiovascular: Regular rate and rhythm, no murmurs / rubs / gallops. No extremity edema. 2+ pedal pulses. No carotid bruits.  Abdomen: no tenderness, no masses palpated. No hepatosplenomegaly. Bowel sounds positive.  Musculoskeletal: no clubbing / cyanosis. No joint deformity upper and lower extremities. Good ROM, no contractures. Normal muscle tone.  Skin: no rashes, lesions, ulcers. No induration Neurologic: CN 2-12 grossly intact. Sensation intact, DTR normal. Strength 5/5 in all 4.  Psychiatric: Normal judgment and insight. Alert and oriented x 3. Normal mood.     Labs on Admission: I have personally reviewed following labs and imaging studies  CBC: Recent Labs  Lab 09/26/17 1239  WBC 7.0  HGB 12.5  HCT 39.8  MCV 96.4  PLT 664   Basic Metabolic Panel: Recent Labs  Lab 09/26/17 1239  NA 139  K 4.1  CL 107  CO2 23  GLUCOSE 95  BUN 7  CREATININE 0.75  CALCIUM 9.1   GFR: CrCl cannot be calculated (Unknown ideal weight.). Liver Function Tests: No results for input(s): AST, ALT, ALKPHOS, BILITOT, PROT, ALBUMIN in the last 168 hours. No results for input(s): LIPASE, AMYLASE in the last 168 hours. No results for input(s): AMMONIA in the last 168 hours. Coagulation Profile: No results for input(s): INR, PROTIME in the last 168 hours. Cardiac Enzymes: No results for input(s):  CKTOTAL, CKMB, CKMBINDEX, TROPONINI in the last 168 hours. BNP (last 3 results) No results for input(s): PROBNP in the last 8760 hours. HbA1C: No results for input(s): HGBA1C in the last 72 hours. CBG: No results for input(s): GLUCAP in the last 168 hours. Lipid Profile: No results for input(s): CHOL, HDL, LDLCALC, TRIG, CHOLHDL, LDLDIRECT in the last 72 hours. Thyroid Function Tests: No results for input(s): TSH, T4TOTAL, FREET4, T3FREE, THYROIDAB in the last 72 hours. Anemia Panel: No results for input(s): VITAMINB12, FOLATE, FERRITIN, TIBC, IRON, RETICCTPCT in the last 72 hours. Urine analysis:    Component Value Date/Time   COLORURINE YELLOW 06/13/2017 1250   APPEARANCEUR CLEAR 06/13/2017 1250   LABSPEC 1.008 06/13/2017 1250   PHURINE 7.0 06/13/2017 1250   GLUCOSEU NEGATIVE 06/13/2017 1250   HGBUR MODERATE (A) 06/13/2017 1250   HGBUR trace-intact 11/15/2007 1632   BILIRUBINUR NEGATIVE 06/13/2017 1250   KETONESUR NEGATIVE 06/13/2017 1250   PROTEINUR NEGATIVE 06/13/2017 1250   UROBILINOGEN 1.0 11/07/2014 1605   NITRITE NEGATIVE 06/13/2017 1250   LEUKOCYTESUR TRACE (A) 06/13/2017 1250   Sepsis Labs: No  results found for this or any previous visit (from the past 240 hour(s)).   Radiological Exams on Admission: Dg Chest 2 View  Result Date: 09/26/2017 CLINICAL DATA:  Chest pain over the last 2 weeks, worsening EXAM: CHEST - 2 VIEW COMPARISON:  Chest x-ray of 03/30/2017 FINDINGS: No active infiltrate or effusion is seen. Mediastinal and hilar contours are unremarkable and the heart is within normal limits in size. No acute bony abnormality is seen. IMPRESSION: No active cardiopulmonary disease. Electronically Signed   By: Ivar Drape M.D.   On: 09/26/2017 13:07    EKG: Independently reviewed.  Sinus bradycardia 58 bpm  Assessment/Plan Hypertensive urgency: Acute.  Patient presents with blood pressure up to 195/127.  Patient does not appear to be on any blood pressure  medications at home.  Patient reports lack of primary care provider and health insurance. - Admit to a telemetry bed - Start amlodipine - Hydralazine IV as needed for elevated blood pressures - Case management order placed for need of PCP/ lacks insurance  Chest pain: Bilateral chest pain that appears reproducible with palpation.  Chest pain appears to be typical in nature and she has had 2 negative troponins.  Suspect possibly costochondritis. - Follow-up d-dimer  - Trend cardiac troponin - Check echocardiogram - Consider need of consult to cardiology in a.m.   History of Polysubstance abuse: Patient had denies any recent use of any illicit drugs.  Previous history of cocaine use. - Follow-up UDS  DVT prophylaxis: Lovenox  Code Status: Full  Family Communication: No family present at bedside Disposition Plan: Discharge home once medically stable Consults called: None Admission status: Observation  Norval Morton MD [facility] Pager [phone]   If 7PM-7AM, please contact night-coverage [email or web address] Password [facility]  09/26/2017, 9:33 PM

## 2017-09-26 NOTE — ED Provider Notes (Signed)
[place] EMERGENCY DEPARTMENT Provider Note   CSN: [id number] Arrival date & time: 09/26/17  1209     History   Chief Complaint Chief Complaint  Patient presents with  . Chest Pain    HPI Jamita KAYLEEN ALIG is a 42 y.o. female.  HPI  Patient is a 42 year old female with a history of anxiety, depression, GERD, and elevated blood pressure (not on antihypertensives), presenting for bilateral chest pain.  Patient reports that she has had intermittent chest pain for the past 2 weeks, but is been constant for the past 24 hours.  Patient reports that sometimes the pain is pleuritic, and sometimes it is exertional and associated with shortness of breath.  Patient denies any personal history of cardiac disease, and denies any family history of early MI.  Patient reports that she has a history of elevated blood pressures in medical visits, but denies being on any antihypertensives, she lost her insurance presently.  Patient reports that she has been clean of cocaine for the past 5 years.  Patient denies any recent illnesses, recent immobilization, hospitalization, cancer treatment, estrogen use, lower extremity edema or calf tenderness.  No history DVT/PE.  Past Medical History:  Diagnosis Date  . Anxiety   . Depression   . Depression   . GERD (gastroesophageal reflux disease)   . Gout    ble  . Migraine   . Migraine     Patient Active Problem List   Diagnosis Date Noted  . Obesity (BMI 30.0-34.9) 05/22/2017  . Ovarian cyst 05/22/2017  . Cavernous hemangioma of liver 05/22/2017  . Major depressive disorder, single episode, severe without psychosis ([place]) 12/31/2015  . Major depressive disorder, recurrent severe without psychotic features ([place]) 12/31/2015  . Alcohol dependence ([place]) 06/07/2013  . Cocaine abuse ([place]) 06/07/2013  . DEPRESSION, MAJOR, RECURRENT 08/17/2007  . ANXIETY STATE NOS 11/15/2006    Past Surgical History:  Procedure Laterality Date  .  CESAREAN SECTION  infection at incission requiring return to OR x 2  . TUBAL LIGATION       OB History    Gravida  3   Para  3   Term  0   Preterm  0   AB  0   Living        SAB  0   TAB  0   Ectopic  0   Multiple      Live Births               Home Medications    Prior to Admission medications   Medication Sig Start Date End Date Taking? Authorizing Provider  acetaminophen (TYLENOL) 500 MG tablet Take 500 mg by mouth every 6 (six) hours as needed for mild pain, moderate pain, fever or headache.   Yes [provider]  albuterol (PROVENTIL HFA;VENTOLIN HFA) 108 (90 Base) MCG/ACT inhaler Inhale 2 puffs into the lungs every 4 (four) hours as needed for wheezing or shortness of breath. 09/23/15  Yes Rogue Bussing, MD  ibuprofen (ADVIL,MOTRIN) 200 MG tablet Take 800 mg by mouth every 6 (six) hours as needed for mild pain.    Yes [provider]  gabapentin (NEURONTIN) 100 MG capsule Take 1 capsule (100 mg total) by mouth 3 (three) times daily. Patient not taking: Reported on 06/20/2017 01/04/16   Derrill Center, NP    Family History Family History  Problem Relation Age of Onset  . Hypertension Mother   . Arthritis Sister   .  Anesthesia problems Neg Hx   . Hypotension Neg Hx   . Malignant hyperthermia Neg Hx   . Pseudochol deficiency Neg Hx     Social History Social History   Tobacco Use  . Smoking status: Current Every Day Smoker    Packs/day: 1.00    Years: 0.00    Pack years: 0.00    Types: Cigarettes  . Smokeless tobacco: Never Used  . Tobacco comment: Currently using Nicotine patch  Substance Use Topics  . Alcohol use: Yes    Comment: 1 beer daily  . Drug use: No     Allergies   Patient has no known allergies.   Review of Systems Review of Systems  Constitutional: Negative for chills and fever.  Respiratory: Positive for chest tightness and shortness of breath. Negative for cough.   Cardiovascular: Positive  for chest pain. Negative for palpitations and leg swelling.  Gastrointestinal: Negative for nausea and vomiting.  Skin: Negative for rash.  All other systems reviewed and are negative.    Physical Exam Updated Vital Signs BP (!) 167/110   Pulse 74   Temp 98 F (36.7 C)   Resp 16   LMP 09/25/2017   SpO2 100%   Physical Exam  Constitutional: She appears well-developed and well-nourished. No distress.  HENT:  Head: Normocephalic and atraumatic.  Mouth/Throat: Oropharynx is clear and moist.  Eyes: Pupils are equal, round, and reactive to light. Conjunctivae and EOM are normal.  Neck: Normal range of motion. Neck supple.  Cardiovascular: Normal rate, regular rhythm, S1 normal and S2 normal.  No murmur heard. Pulses:      Radial pulses are 2+ on the right side, and 2+ on the left side.       Dorsalis pedis pulses are 2+ on the right side, and 2+ on the left side.  Patient has bilateral chest wall tenderness.  Pulmonary/Chest: Effort normal and breath sounds normal. She has no wheezes. She has no rales.  Abdominal: Soft. She exhibits no distension. There is no tenderness. There is no guarding.  Musculoskeletal: Normal range of motion. She exhibits no edema or deformity.  Lymphadenopathy:    She has no cervical adenopathy.  Neurological: She is alert.  Cranial nerves grossly intact. Patient moves extremities symmetrically and with good coordination.  Skin: Skin is warm and dry. No rash noted. No erythema.  Erythema of the anterior thorax without any lesions  Psychiatric: She has a normal mood and affect. Her behavior is normal. Judgment and thought content normal.  Nursing note and vitals reviewed.    ED Treatments / Results  Labs (all labs ordered are listed, but only abnormal results are displayed) Labs Reviewed  BASIC METABOLIC PANEL  CBC  I-STAT TROPONIN, ED  I-STAT BETA HCG BLOOD, ED ([facility], [facility], [facility] ONLY)    EKG EKG Interpretation  Date/Time:  Tuesday September 26 2017  12:15:38 EDT Ventricular Rate:  85 PR Interval:  122 QRS Duration: 88 QT Interval:  362 QTC Calculation: 430 R Axis:   68 Text Interpretation:  Normal sinus rhythm Normal ECG No significant change since last tracing Confirmed by Merrily Pew [phone]) on 09/26/2017 8:51:24 PM  ED ECG REPORT   Date: 09/26/2017  Rate: 58  Rhythm: normal sinus rhythm  QRS Axis: normal  Intervals: PR prolonged (slightly)  ST/T Wave abnormalities: normal  Conduction Disutrbances:none  Narrative Interpretation:   Old EKG Reviewed: unchanged   Radiology Dg Chest 2 View  Result Date: 09/26/2017 CLINICAL DATA:  Chest pain over  the last 2 weeks, worsening EXAM: CHEST - 2 VIEW COMPARISON:  Chest x-ray of 03/30/2017 FINDINGS: No active infiltrate or effusion is seen. Mediastinal and hilar contours are unremarkable and the heart is within normal limits in size. No acute bony abnormality is seen. IMPRESSION: No active cardiopulmonary disease. Electronically Signed   By: Ivar Drape M.D.   On: 09/26/2017 13:07    Procedures Procedures (including critical care time)  Medications Ordered in ED Medications - No data to display   Initial Impression / Assessment and Plan / ED Course  I have reviewed the triage vital signs and the nursing notes.  Pertinent labs & imaging results that were available during my care of the patient were reviewed by me and considered in my medical decision making (see chart for details).  Clinical Course as of Sep 26 2144  Tue Sep 26, 2017  2143 Spoke with Dr. Tamala Julian of [facility] regarding admission for patient. Appreciate Dr. Thompson Caul involvement in the care of this patient.   [AM]    Clinical Course User Index [AM] Albesa Seen, PA-C    Differential diagnosis includes ACS, pulmonary embolism, hypertensive urgency/emergency,  thoracic aortic dissection, herpes zoster, pneumonia, bronchitis.  Patient has a heart score of 2 for story and risk factors.  Patient is low  risk via Wells and PERC criteria.  Doubt TAD, as patient has equal pulses in all extremities, and pain does not radiate to the back.  Additionally, no high risk family history for this condition.  Initial troponin is negative.  No other abnormalities on lab work.  Initial repeat EKG is without any active ischemic changes.  Chest x-ray without acute cardia pulmonary abnormalities.  D-dimer is pending.   Patient has persistently elevated blood pressure emergency department.  Raises concern for hypertensive urgency.  Will discuss admission with hospitalist.  Patient has history of cocaine use, reports no use in 5 years.  Will obtain UDS before using labetalol.  We will temporize with hydralazine.  Vitals:   09/26/17 2000 09/26/17 2033  BP: (!) 175/111 (!) 194/109  Pulse: 62 (!) 59  Resp: 17 18  Temp:    SpO2: 100% 100%   Patient to be admitted for hypertensive urgency.  Final Clinical Impressions(s) / ED Diagnoses   Final diagnoses:  Chest pain, unspecified type  Elevated blood pressure reading in office with diagnosis of hypertension    ED Discharge Orders    None       Tamala Julian 09/26/17 2147    Mesner, Corene Cornea, MD 09/26/17 [phone]

## 2017-09-26 NOTE — ED Triage Notes (Signed)
Pt reports she has been having chest pain X2 weeks. She states she initially thought it was chest wall pain but has gotten persistently worse. She states pain has been constant since last night. She reports worse with movement and breathing.

## 2017-09-27 ENCOUNTER — Other Ambulatory Visit (HOSPITAL_COMMUNITY): Payer: Self-pay

## 2017-09-27 ENCOUNTER — Observation Stay (HOSPITAL_BASED_OUTPATIENT_CLINIC_OR_DEPARTMENT_OTHER): Payer: Medicaid Other

## 2017-09-27 DIAGNOSIS — F1910 Other psychoactive substance abuse, uncomplicated: Secondary | ICD-10-CM | POA: Clinically undetermined

## 2017-09-27 DIAGNOSIS — R079 Chest pain, unspecified: Secondary | ICD-10-CM

## 2017-09-27 DIAGNOSIS — I160 Hypertensive urgency: Secondary | ICD-10-CM

## 2017-09-27 DIAGNOSIS — I5030 Unspecified diastolic (congestive) heart failure: Secondary | ICD-10-CM | POA: Diagnosis not present

## 2017-09-27 LAB — CBC
HCT: 37.5 % (ref 36.0–46.0)
HEMOGLOBIN: 12.2 g/dL (ref 12.0–15.0)
MCH: 30 pg (ref 26.0–34.0)
MCHC: 32.5 g/dL (ref 30.0–36.0)
MCV: 92.4 fL (ref 78.0–100.0)
Platelets: 268 10*3/uL (ref 150–400)
RBC: 4.06 MIL/uL (ref 3.87–5.11)
RDW: 13.9 % (ref 11.5–15.5)
WBC: 6.9 10*3/uL (ref 4.0–10.5)

## 2017-09-27 LAB — BASIC METABOLIC PANEL
Anion gap: 9 (ref 5–15)
BUN: 5 mg/dL — ABNORMAL LOW (ref 6–20)
CHLORIDE: 106 mmol/L (ref 98–111)
CO2: 23 mmol/L (ref 22–32)
CREATININE: 0.54 mg/dL (ref 0.44–1.00)
Calcium: 9 mg/dL (ref 8.9–10.3)
GFR calc non Af Amer: >60 mL/min (ref >60)
GLUCOSE: 110 mg/dL — AB (ref 70–99)
Potassium: 3.6 mmol/L (ref 3.5–5.1)
Sodium: 138 mmol/L (ref 135–145)

## 2017-09-27 LAB — ECHOCARDIOGRAM COMPLETE: Weight: 2839.52 oz

## 2017-09-27 LAB — TROPONIN I
Troponin I: <0.03 ng/mL (ref <0.03)
Troponin I: <0.03 ng/mL (ref <0.03)

## 2017-09-27 LAB — MRSA PCR SCREENING: MRSA by PCR: NEGATIVE

## 2017-09-27 MED ORDER — AMLODIPINE BESYLATE 10 MG PO TABS: 10.0000 mg | ORAL_TABLET | Freq: Every day | ORAL | 0 refills | Status: DC

## 2017-09-27 MED ORDER — CAPSAICIN 0.025 % EX CREA
TOPICAL_CREAM | Freq: Two times a day (BID) | CUTANEOUS | Status: DC
  Filled 2017-09-27: qty 60

## 2017-09-27 MED ORDER — CAPSAICIN 0.025 % EX CREA: TOPICAL_CREAM | Freq: Two times a day (BID) | CUTANEOUS | 0 refills | Status: DC

## 2017-09-27 MED ORDER — OXYCODONE-ACETAMINOPHEN 5-325 MG PO TABS
2.0000 | ORAL_TABLET | Freq: Once | ORAL | Status: AC
  Administered 2017-09-27: 2 via ORAL
  Filled 2017-09-27: qty 2

## 2017-09-27 NOTE — Progress Notes (Signed)
  Echocardiogram 2D Echocardiogram has been performed.  Gina Shelton 09/27/2017, 4:41 PM

## 2017-09-27 NOTE — Care Management Note (Addendum)
Case Management Note  Patient Details  Name: Gina Shelton MRN: [id number] Date of Birth: [birth year]  Subjective/Objective:      Pt admitted with hypertension emergency            Action/Plan:  PTA independent from home.  Pt informed CM that she only has family planning medicaid - not full access.  CM confirmed with [facility] that pt has family medicaid at least however full access medicaid can not be excluded. CM arranged PCP appt at [facility] on 8/14 AT 8:50am.  CM communicated appt and explained that pt will be able to utilize [facility] pharmacy at discharge.   CM requested attending to prescribe cost friendly medications at discharge as pt can not be matched with active insurance coverage.    Expected Discharge Date:                  Expected Discharge Plan:  Home/Self Care  In-House Referral:     Discharge planning Services  CM Consult  Post Acute Care Choice:    Choice offered to:     DME Arranged:    DME Agency:     HH Arranged:    HH Agency:     Status of Service:  In process, will continue to follow  If discussed at Long Length of Stay Meetings, dates discussed:    Additional Comments:  Maryclare Labrador, RN 09/27/2017, 3:02 PM

## 2017-09-27 NOTE — Progress Notes (Addendum)
Continue to await echo reading before d/c-- if not back before 7 will have pcp follow up at appointment. Gina Shelton

## 2017-09-28 NOTE — Discharge Summary (Signed)
Physician Discharge Summary  ANNALEAH ARATA WSF:[id number] DOB: April 09, 1975 DOA: 09/26/2017  PCP: Patient, No Pcp Per-- [facility]  Admit date: 09/26/2017 Discharge date: 09/28/2017  Admitted From: home Discharge disposition: home   Recommendations for Outpatient Follow-Up:   1. Titration of BP meds 2. Marijuana cessation   Discharge Diagnosis:   Principal Problem:   Hypertensive urgency Active Problems:   Chest pain   Polysubstance abuse [facility])    Discharge Condition: Improved.  Diet recommendation: Low sodium, heart healthy  Wound care: None.  Code status: Full.   History of Present Illness:   Lacresia VALOR [place] is a 42 y.o. female with medical history significant of anxiety, depression, migraine headaches, and GERD; who resents with complaints of 2 weeks of chest pain.  Pain is described as sharp and located on across both sides of her chest with radiation to her back with inspiration.  Patient reports trying to utilize ibuprofen and Tylenol without relief of symptoms.  Heavy breathing or movements worsen symptoms.  Associated symptoms include complaints of nausea, sweats, mild shortness of breath, subjective fevers, seasonal allergies, and unchanged chronic dry cough which she relates to issues history of smoking.  Patient reports that she has cut back smoking down to half a pack of cigarettes per day.  Patient reports having chest wall pain previously in the past, but reports never being this severe.     Hospital Course by Problem:   Hypertensive urgency: Acute.  Patient presents with blood pressure up to 195/127.  Patient does not appear to be on any blood pressure medications at home.  Patient reports lack of primary care provider and health insurance. - Started amlodipine with improvement - $4 at [facility] -pcp appointment made for patient  Chest pain:  chest pain that appears reproducible with palpation.  Chest pain  appears to be typical in nature and she has had 2 negative troponins.  Suspect possibly costochondritis. -topical treatment -echo:Left ventricle: The cavity size was normal. There was mild   concentric hypertrophy. Systolic function was normal. The   estimated ejection fraction was in the range of 60% to 65%. Wall   motion was normal; there were no regional wall motion   abnormalities. Doppler parameters are consistent with abnormal   left ventricular relaxation (grade 1 diastolic dysfunction).   History of Polysubstance abuse: Patient had denies any recent use of any illicit drugs.      Medical Consultants:      Discharge Exam:   Vitals:   09/27/17 1600 09/27/17 1811  BP:  (!) 156/98  Pulse: 69 (!) 58  Resp: (!) 21 17  Temp:  98.1 F (36.7 C)  SpO2: 100% 100%   Vitals:   09/27/17 0322 09/27/17 0700 09/27/17 1600 09/27/17 1811  BP: (!) 156/83 (!) 139/95  (!) 156/98  Pulse: (!) 57 61 69 (!) 58  Resp: 20 20 (!) 21 17  Temp: 97.9 F (36.6 C) 97.9 F (36.6 C)  98.1 F (36.7 C)  TempSrc: Oral Oral  Oral  SpO2: 100% 99% 100% 100%  Weight:        General exam: Appears calm and comfortable. Chest wall tender to palpation   The results of significant diagnostics from this hospitalization (including imaging, microbiology, ancillary and laboratory) are listed below for reference.     Procedures and Diagnostic Studies:   Dg Chest 2 View  Result Date: 09/26/2017 CLINICAL DATA:  Chest pain over the last 2  weeks, worsening EXAM: CHEST - 2 VIEW COMPARISON:  Chest x-ray of 03/30/2017 FINDINGS: No active infiltrate or effusion is seen. Mediastinal and hilar contours are unremarkable and the heart is within normal limits in size. No acute bony abnormality is seen. IMPRESSION: No active cardiopulmonary disease. Electronically Signed   By: Ivar Drape M.D.   On: 09/26/2017 13:07     Labs:   Basic Metabolic Panel: Recent Labs  Lab 09/26/17 1239 09/27/17 0520  NA 139 138    K 4.1 3.6  CL 107 106  CO2 23 23  GLUCOSE 95 110*  BUN 7 5*  CREATININE 0.75 0.54  CALCIUM 9.1 9.0   GFR Estimated Creatinine Clearance: 93 mL/min (by C-G formula based on SCr of 0.54 mg/dL). Liver Function Tests: No results for input(s): AST, ALT, ALKPHOS, BILITOT, PROT, ALBUMIN in the last 168 hours. No results for input(s): LIPASE, AMYLASE in the last 168 hours. No results for input(s): AMMONIA in the last 168 hours. Coagulation profile No results for input(s): INR, PROTIME in the last 168 hours.  CBC: Recent Labs  Lab 09/26/17 1239 09/27/17 0520  WBC 7.0 6.9  HGB 12.5 12.2  HCT 39.8 37.5  MCV 96.4 92.4  PLT 289 268   Cardiac Enzymes: Recent Labs  Lab 09/27/17 0018 09/27/17 0520 09/27/17 1132  TROPONINI <0.03 <0.03 <0.03   BNP: Invalid input(s): POCBNP CBG: No results for input(s): GLUCAP in the last 168 hours. D-Dimer Recent Labs    09/26/17 2032  DDIMER 0.48   Hgb A1c No results for input(s): HGBA1C in the last 72 hours. Lipid Profile No results for input(s): CHOL, HDL, LDLCALC, TRIG, CHOLHDL, LDLDIRECT in the last 72 hours. Thyroid function studies No results for input(s): TSH, T4TOTAL, T3FREE, THYROIDAB in the last 72 hours.  Invalid input(s): FREET3 Anemia work up No results for input(s): VITAMINB12, FOLATE, FERRITIN, TIBC, IRON, RETICCTPCT in the last 72 hours. Microbiology Recent Results (from the past 240 hour(s))  MRSA PCR Screening     Status: None   Collection Time: 09/27/17  2:27 AM  Result Value Ref Range Status   MRSA by PCR NEGATIVE NEGATIVE Final    Comment:        The GeneXpert MRSA Assay (FDA approved for NASAL specimens only), is one component of a comprehensive MRSA colonization surveillance program. It is not intended to diagnose MRSA infection nor to guide or monitor treatment for MRSA infections. Performed at [facility], [place] [address], [place],  [address]      Discharge Instructions:   Discharge  Instructions    Diet - low sodium heart healthy   Complete by:  As directed    Discharge instructions   Complete by:  As directed    amlodipine is on the $4 list of medications at [facility]- fill your script there please   Increase activity slowly   Complete by:  As directed      Allergies as of 09/27/2017   No Known Allergies     Medication List    STOP taking these medications   gabapentin 100 MG capsule Commonly known as:  NEURONTIN     TAKE these medications   acetaminophen 500 MG tablet Commonly known as:  TYLENOL Take 500 mg by mouth every 6 (six) hours as needed for mild pain, moderate pain, fever or headache.   albuterol 108 (90 Base) MCG/ACT inhaler Commonly known as:  PROVENTIL HFA;VENTOLIN HFA Inhale 2 puffs into the lungs every 4 (four) hours as  needed for wheezing or shortness of breath.   amLODipine 10 MG tablet Commonly known as:  NORVASC Take 1 tablet (10 mg total) by mouth daily.   capsaicin 0.025 % cream Commonly known as:  ZOSTRIX Apply topically 2 (two) times daily.   ibuprofen 200 MG tablet Commonly known as:  ADVIL,MOTRIN Take 800 mg by mouth every 6 (six) hours as needed for mild pain.      Follow-up Information    [facility] on 10/25/2017.   Why:  Primary Care Physician appt at 8:50am.   Contact information: [place] [address] [place]. Go to.   Why:  at your earliest convenience - go to this clinic pharmacy and apply for blue card (medication assitance card) Contact information: [address] [place] [address] [phone]           Time coordinating discharge: 35 min  Signed:  Geradine Girt  [facility] 09/28/2017, 3:39 PM

## 2017-10-25 ENCOUNTER — Ambulatory Visit (INDEPENDENT_AMBULATORY_CARE_PROVIDER_SITE_OTHER): Payer: Self-pay | Admitting: Physician Assistant

## 2017-10-25 ENCOUNTER — Encounter (INDEPENDENT_AMBULATORY_CARE_PROVIDER_SITE_OTHER): Payer: Self-pay | Admitting: Physician Assistant

## 2017-10-25 ENCOUNTER — Other Ambulatory Visit: Payer: Self-pay

## 2017-10-25 VITALS — BP 125/86 | HR 115 | Temp 98.7°F | Ht 65.0 in | Wt 174.4 lb

## 2017-10-25 DIAGNOSIS — G8929 Other chronic pain: Secondary | ICD-10-CM

## 2017-10-25 DIAGNOSIS — M5441 Lumbago with sciatica, right side: Secondary | ICD-10-CM

## 2017-10-25 DIAGNOSIS — Z131 Encounter for screening for diabetes mellitus: Secondary | ICD-10-CM

## 2017-10-25 DIAGNOSIS — J029 Acute pharyngitis, unspecified: Secondary | ICD-10-CM

## 2017-10-25 LAB — POCT GLYCOSYLATED HEMOGLOBIN (HGB A1C): Hemoglobin A1C: 5.2 % (ref 4.0–5.6)

## 2017-10-25 MED ORDER — BENZOCAINE-MENTHOL 15-2.6 MG MT LOZG: 1.0000 | LOZENGE | OROMUCOSAL | 0 refills | Status: DC | PRN

## 2017-10-25 MED ORDER — NAPROXEN 500 MG PO TABS: 500.0000 mg | ORAL_TABLET | Freq: Two times a day (BID) | ORAL | 0 refills | Status: DC

## 2017-10-25 MED ORDER — AMOXICILLIN-POT CLAVULANATE 875-125 MG PO TABS: 1.0000 | ORAL_TABLET | Freq: Two times a day (BID) | ORAL | 0 refills | Status: DC

## 2017-10-25 NOTE — Progress Notes (Signed)
Pt complains of body aches , headache, ear/throat pain onset four days ago

## 2017-10-25 NOTE — Progress Notes (Signed)
Subjective:  Patient ID: Gina Shelton, female    DOB: April 21, 1975  Age: 42 y.o. MRN: [id number]  CC: cold   HPI Gina Shelton is a 42 y.o. female with a medical history of anxiety, depression, GERD, Gout, and Migraine presents with four day history of ear discomfort, nasal congestion, cough, body aches, fever, and sore throat. No close contacts with the same. Has taken Ibuprofen with little relief. Does not endorse any other associated symptoms.    Also has chronic lower back pain with right sided sciatica x5-6 years. Pain currently at 0/10 but can suddenly go to a 10/10. Taking Gabapentin with moderate relief but can not take during the day due to sedative effect. Does not endorse saddle paresthesia, urinary/fecal incontinence, weakness, paralysis, edema, or limited aROM.       Outpatient Medications Prior to Visit  Medication Sig Dispense Refill  . albuterol (PROVENTIL HFA;VENTOLIN HFA) 108 (90 Base) MCG/ACT inhaler Inhale 2 puffs into the lungs every 4 (four) hours as needed for wheezing or shortness of breath. 1 Inhaler 0  . amLODipine (NORVASC) 10 MG tablet Take 1 tablet (10 mg total) by mouth daily. 30 tablet 0  . capsaicin (ZOSTRIX) 0.025 % cream Apply topically 2 (two) times daily. 45 g 0  . ibuprofen (ADVIL,MOTRIN) 200 MG tablet Take 800 mg by mouth every 6 (six) hours as needed for mild pain.     Marland Kitchen acetaminophen (TYLENOL) 500 MG tablet Take 500 mg by mouth every 6 (six) hours as needed for mild pain, moderate pain, fever or headache.     No facility-administered medications prior to visit.      ROS Review of Systems  Constitutional: Positive for fever and malaise/fatigue. Negative for chills.  HENT: Positive for congestion, ear pain and sore throat.   Eyes: Negative for blurred vision.  Respiratory: Positive for cough. Negative for shortness of breath.   Cardiovascular: Negative for chest pain and palpitations.  Gastrointestinal: Negative for abdominal pain and  nausea.  Genitourinary: Negative for dysuria and hematuria.  Musculoskeletal: Negative for joint pain and myalgias.  Skin: Negative for rash.  Neurological: Negative for tingling and headaches.  Psychiatric/Behavioral: Negative for depression. The patient is not nervous/anxious.     Objective:  BP 125/86 (BP Location: Left Arm, Patient Position: Sitting, Cuff Size: Normal)   Pulse (!) 115   Temp 98.7 F (37.1 C) (Oral)   Ht 5\' 5"  (1.651 m)   Wt 174 lb 6.4 oz (79.1 kg)   LMP 10/21/2017 (Exact Date)   SpO2 97%   BMI 29.02 kg/m   BP/Weight 10/25/2017 09/27/2017 12/05/2681  Systolic BP 419 622 -  Diastolic BP 86 98 -  Wt. (Lbs) 174.4 - 177.47  BMI 29.02 - 31.94  Some encounter information is confidential and restricted. Go to Review Flowsheets activity to see all data.      Physical Exam  Constitutional: She is oriented to person, place, and time.  Well developed, well nourished, NAD, polite  HENT:  Head: Normocephalic and atraumatic.  Left sided tonsillar exudates with generalized  mild erythema of the oropharynx.  Eyes: No scleral icterus.  Neck: Normal range of motion. Neck supple. No thyromegaly present.  Cardiovascular: Normal rate, regular rhythm and normal heart sounds.  Pulmonary/Chest: Effort normal and breath sounds normal.  Musculoskeletal: She exhibits no edema.  Full aROM of back  Lymphadenopathy:    She has cervical adenopathy.  Neurological: She is alert and oriented to person, place, and time.  Skin:  Skin is warm and dry. No rash noted. No erythema. No pallor.  Psychiatric: She has a normal mood and affect. Her behavior is normal. Thought content normal.  Vitals reviewed.    Assessment & Plan:     1. Acute pharyngitis, unspecified etiology - naproxen (NAPROSYN) 500 MG tablet; Take 1 tablet (500 mg total) by mouth 2 (two) times daily with a meal.  Dispense: 30 tablet; Refill: 0 - amoxicillin-clavulanate (AUGMENTIN) 875-125 MG tablet; Take 1 tablet by  mouth 2 (two) times daily for 10 days.  Dispense: 20 tablet; Refill: 0 - Benzocaine-Menthol (CEPACOL EXTRA STRENGTH) 15-2.6 MG LOZG; Use as directed 1 lozenge in the mouth or throat every 2 (two) hours as needed.  Dispense: 50 lozenge; Refill: 0  2. Screening for diabetes mellitus - HgB A1c 5.2%  3. Chronic right-sided low back pain with right-sided sciatica - DG Lumbar Spine Complete; Future   Meds ordered this encounter  Medications  . naproxen (NAPROSYN) 500 MG tablet    Sig: Take 1 tablet (500 mg total) by mouth 2 (two) times daily with a meal.    Dispense:  30 tablet    Refill:  0    Order Specific Question:   Supervising Provider    Answer:   Charlott Rakes [[id number]]  . amoxicillin-clavulanate (AUGMENTIN) 875-125 MG tablet    Sig: Take 1 tablet by mouth 2 (two) times daily for 10 days.    Dispense:  20 tablet    Refill:  0    Order Specific Question:   Supervising Provider    Answer:   Charlott Rakes [[id number]]  . Benzocaine-Menthol (CEPACOL EXTRA STRENGTH) 15-2.6 MG LOZG    Sig: Use as directed 1 lozenge in the mouth or throat every 2 (two) hours as needed.    Dispense:  50 lozenge    Refill:  0    Order Specific Question:   Supervising Provider    Answer:   Charlott Rakes [[id number]]    Follow-up: Return if symptoms worsen or fail to improve.   Clent Demark PA

## 2017-10-25 NOTE — Patient Instructions (Signed)

## 2017-10-26 ENCOUNTER — Telehealth (INDEPENDENT_AMBULATORY_CARE_PROVIDER_SITE_OTHER): Payer: Self-pay | Admitting: Physician Assistant

## 2017-10-26 ENCOUNTER — Other Ambulatory Visit (INDEPENDENT_AMBULATORY_CARE_PROVIDER_SITE_OTHER): Payer: Self-pay | Admitting: Physician Assistant

## 2017-10-26 DIAGNOSIS — J029 Acute pharyngitis, unspecified: Secondary | ICD-10-CM

## 2017-10-26 MED ORDER — AMOXICILLIN-POT CLAVULANATE 875-125 MG PO TABS: 1.0000 | ORAL_TABLET | Freq: Two times a day (BID) | ORAL | 0 refills | Status: AC

## 2017-10-26 NOTE — Telephone Encounter (Signed)
Spoke with patient and advised she use [facility]. Please send to [facility]. Nat Christen, CMA

## 2017-10-26 NOTE — Telephone Encounter (Signed)
Sent to [facility]

## 2017-10-26 NOTE — Telephone Encounter (Signed)
Patient called to informal that she went to [facility] to pick up her prescription for  amoxicillin-clavulanate (AUGMENTIN) 875-125 MG tablet   And was told it would cost $51  Patient wants to know if PCP can prescribe her any thing that will not cost as much  Please Advice [phone]  Thank you Emmit Pomfret

## 2017-11-06 MED FILL — AMOX-CLAV 875-125 MG TABLET: 875-125 | 10 days supply | Qty: 20 | Fill #0

## 2018-04-03 ENCOUNTER — Other Ambulatory Visit: Payer: Self-pay

## 2018-04-03 ENCOUNTER — Emergency Department (HOSPITAL_COMMUNITY)
Admission: EM | Admit: 2018-04-03 | Discharge: 2018-04-03 | Disposition: A | Discharge status: Home / Self Care | Payer: Medicaid Other | Attending: Emergency Medicine | Admitting: Emergency Medicine

## 2018-04-03 DIAGNOSIS — F17210 Nicotine dependence, cigarettes, uncomplicated: Secondary | ICD-10-CM | POA: Insufficient documentation

## 2018-04-03 DIAGNOSIS — K602 Anal fissure, unspecified: Secondary | ICD-10-CM | POA: Insufficient documentation

## 2018-04-03 DIAGNOSIS — K529 Noninfective gastroenteritis and colitis, unspecified: Secondary | ICD-10-CM

## 2018-04-03 DIAGNOSIS — K921 Melena: Secondary | ICD-10-CM | POA: Insufficient documentation

## 2018-04-03 LAB — COMPREHENSIVE METABOLIC PANEL
ALBUMIN: 3.9 g/dL (ref 3.5–5.0)
ALK PHOS: 92 U/L (ref 38–126)
ALT: 14 U/L (ref 0–44)
AST: 19 U/L (ref 15–41)
Anion gap: 10 (ref 5–15)
BILIRUBIN TOTAL: 0.4 mg/dL (ref 0.3–1.2)
BUN: 6 mg/dL (ref 6–20)
CALCIUM: 9 mg/dL (ref 8.9–10.3)
CO2: 21 mmol/L — AB (ref 22–32)
CREATININE: 0.8 mg/dL (ref 0.44–1.00)
Chloride: 106 mmol/L (ref 98–111)
GFR calc Af Amer: >60 mL/min (ref >60)
GFR calc non Af Amer: >60 mL/min (ref >60)
Glucose, Bld: 106 mg/dL — ABNORMAL HIGH (ref 70–99)
Potassium: 3.7 mmol/L (ref 3.5–5.1)
Sodium: 137 mmol/L (ref 135–145)
TOTAL PROTEIN: 7.4 g/dL (ref 6.5–8.1)

## 2018-04-03 LAB — URINALYSIS, ROUTINE W REFLEX MICROSCOPIC
Bilirubin Urine: NEGATIVE
GLUCOSE, UA: NEGATIVE mg/dL
Hgb urine dipstick: NEGATIVE
Ketones, ur: NEGATIVE mg/dL
Leukocytes, UA: NEGATIVE
Nitrite: NEGATIVE
Protein, ur: NEGATIVE mg/dL
Specific Gravity, Urine: 1.016 (ref 1.005–1.030)
pH: 7 (ref 5.0–8.0)

## 2018-04-03 LAB — CBC WITH DIFFERENTIAL/PLATELET
ABS IMMATURE GRANULOCYTES: 0.02 10*3/uL (ref 0.00–0.07)
BASOS PCT: 0 %
Basophils Absolute: 0 10*3/uL (ref 0.0–0.1)
EOS ABS: 0.1 10*3/uL (ref 0.0–0.5)
Eosinophils Relative: 1 %
HCT: 38.7 % (ref 36.0–46.0)
HEMOGLOBIN: 12.2 g/dL (ref 12.0–15.0)
IMMATURE GRANULOCYTES: 0 %
LYMPHS ABS: 1.7 10*3/uL (ref 0.7–4.0)
Lymphocytes Relative: 22 %
MCH: 29.6 pg (ref 26.0–34.0)
MCHC: 31.5 g/dL (ref 30.0–36.0)
MCV: 93.9 fL (ref 80.0–100.0)
MONO ABS: 0.6 10*3/uL (ref 0.1–1.0)
Monocytes Relative: 8 %
Neutro Abs: 5.4 10*3/uL (ref 1.7–7.7)
Neutrophils Relative %: 69 %
PLATELETS: 263 10*3/uL (ref 150–400)
RBC: 4.12 MIL/uL (ref 3.87–5.11)
RDW: 17.2 % — ABNORMAL HIGH (ref 11.5–15.5)
WBC: 7.8 10*3/uL (ref 4.0–10.5)
nRBC: 0 % (ref 0.0–0.2)

## 2018-04-03 LAB — LIPASE, BLOOD: LIPASE: 26 U/L (ref 11–51)

## 2018-04-03 LAB — PREGNANCY, URINE: PREG TEST UR: NEGATIVE

## 2018-04-03 LAB — POC OCCULT BLOOD, ED: Fecal Occult Bld: POSITIVE — AB

## 2018-04-03 MED ORDER — ALUM & MAG HYDROXIDE-SIMETH 200-200-20 MG/5ML PO SUSP
15.0000 mL | Freq: Once | ORAL | Status: AC
  Administered 2018-04-03: 15 mL via ORAL
  Filled 2018-04-03: qty 30

## 2018-04-03 MED ORDER — AMLODIPINE BESYLATE 10 MG PO TABS: 10.0000 mg | ORAL_TABLET | Freq: Every day | ORAL | 0 refills | Status: DC

## 2018-04-03 MED ORDER — AMLODIPINE BESYLATE 5 MG PO TABS
10.0000 mg | ORAL_TABLET | Freq: Every day | ORAL | Status: DC
  Administered 2018-04-03: 10 mg via ORAL
  Filled 2018-04-03: qty 2

## 2018-04-03 MED ORDER — ONDANSETRON 4 MG PO TBDP
4.0000 mg | ORAL_TABLET | Freq: Once | ORAL | Status: AC
  Administered 2018-04-03: 4 mg via ORAL
  Filled 2018-04-03: qty 1

## 2018-04-03 MED ORDER — ACETAMINOPHEN 500 MG PO TABS
1000.0000 mg | ORAL_TABLET | Freq: Once | ORAL | Status: AC
  Administered 2018-04-03: 1000 mg via ORAL
  Filled 2018-04-03: qty 2

## 2018-04-03 NOTE — Discharge Instructions (Signed)
Your work up here has been normal. It is important that you schedule an appointment with your regular doctor for management of your high blood pressure and follow-up regarding the blood in your stool.  As discussed that is likely from an anal fissure given normal exam.  I have sent amlodipine 10 mg for your blood pressure to your pharmacy in order to get you to your regular doctor.  If you develop any chest pain, shortness of breath, fevers or worsening abdominal pain or bleeding then please do not hesitate to return to the emergency room.

## 2018-04-03 NOTE — ED Provider Notes (Signed)
[facility] EMERGENCY DEPARTMENT Provider Note   CSN: [id number] Arrival date & time: 04/03/18  4098   History   Chief Complaint No chief complaint on file.   HPI Gina Shelton is a 43 y.o. female with PMH ofanxiety/depression, migraine headaches, fibroids, and GERD.  Patient reports that for the past week she has had abdominal pain that radiates to her lower back.  States that then 4 days ago she also developed diarrhea with bright red blood as well as vomiting that had small amount of blood in it as well.  She reports that she has been nauseous and having stomach pain after eating.    Past Medical History:  Diagnosis Date  . Anxiety   . Depression   . Depression   . GERD (gastroesophageal reflux disease)   . Gout    ble  . Migraine   . Migraine     Patient Active Problem List   Diagnosis Date Noted  . Chest pain 09/27/2017  . Polysubstance abuse ([place]) 09/27/2017  . Hypertensive urgency 09/26/2017  . Obesity (BMI 30.0-34.9) 05/22/2017  . Ovarian cyst 05/22/2017  . Cavernous hemangioma of liver 05/22/2017  . Major depressive disorder, single episode, severe without psychosis ([place]) 12/31/2015  . 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 Alcohol dependence ([place]) 06/07/2013  . Cocaine abuse ([place]) 06/07/2013  . DEPRESSION, MAJOR, RECURRENT 08/17/2007  . ANXIETY STATE NOS 11/15/2006    Past Surgical History:  Procedure Laterality Date  . CESAREAN SECTION  infection at incission requiring return to OR x 2  . TUBAL LIGATION       OB History    Gravida  3   Para  3   Term  0   Preterm  0   AB  0   Living        SAB  0   TAB  0   Ectopic  0   Multiple      Live Births               Home Medications    Prior to Admission medications   Medication Sig Start Date End Date Taking? Authorizing Provider  acetaminophen (TYLENOL) 500 MG tablet Take 500 mg by mouth every 6 (six)  hours as needed for mild pain or headache.    Yes [provider]  albuterol (PROVENTIL HFA;VENTOLIN HFA) 108 (90 Base) MCG/ACT inhaler Inhale 2 puffs into the lungs every 4 (four) hours as needed for wheezing or shortness of breath. 09/23/15  Yes Rogue Bussing, MD  capsaicin (ZOSTRIX) 0.025 % cream Apply topically 2 (two) times daily. Patient taking differently: Apply 1 application topically as needed (pain).  09/27/17  Yes Vann, Jessica U, DO  ibuprofen (ADVIL,MOTRIN) 200 MG tablet Take 800 mg by mouth every 6 (six) hours as needed for mild pain.    Yes [provider]  amLODipine (NORVASC) 10 MG tablet Take 1 tablet (10 mg total) by mouth daily for 30 days. 04/03/18 05/03/18  Deno Etienne, DO  Benzocaine-Menthol (CEPACOL EXTRA STRENGTH) 15-2.6 MG LOZG Use as directed 1 lozenge in the mouth or throat every 2 (two) hours as needed. Patient not taking: Reported on 04/03/2018 10/25/17   Clent Demark, PA-C  naproxen (NAPROSYN) 500 MG tablet Take 1 tablet (500 mg total) by mouth 2 (two) times daily with a meal. Patient not taking: Reported on 04/03/2018 10/25/17   Clent Demark, PA-C    Family History  Family History  Problem Relation Age of Onset  . Hypertension Mother   . Arthritis Sister   . Anesthesia problems Neg Hx   . Hypotension Neg Hx   . Malignant hyperthermia Neg Hx   . Pseudochol deficiency Neg Hx     Social History Social History   Tobacco Use  . Smoking status: Current Every Day Smoker    Packs/day: 1.00    Years: 0.00    Pack years: 0.00    Types: Cigarettes  . Smokeless tobacco: Never Used  Substance Use Topics  . Alcohol use: Yes    Comment: 1 beer daily  . Drug use: No     Allergies   Patient has no known allergies.   Review of Systems Review of Systems  Constitutional: Negative for chills and fever.  HENT: Positive for postnasal drip.   Respiratory: Negative for cough, chest tightness and shortness of breath.     Cardiovascular: Negative for chest pain and leg swelling.  Gastrointestinal: Positive for abdominal pain, anal bleeding, blood in stool, diarrhea, nausea, rectal pain and vomiting.  Genitourinary: Negative for dysuria.  Neurological: Negative for dizziness and light-headedness.  All other systems reviewed and are negative.    Physical Exam Updated Vital Signs BP (!) 151/83   Pulse 72   Temp 98.5 F (36.9 C) (Oral)   Resp 16   Ht 5' 2.5" (1.588 m)   Wt 77.1 kg   SpO2 97%   BMI 30.60 kg/m   Physical Exam Vitals signs and nursing note reviewed.  Constitutional:      General: She is not in acute distress.    Appearance: Normal appearance. She is normal weight.  HENT:     Head: Normocephalic and atraumatic.     Nose: Nose normal.     Mouth/Throat:     Mouth: Mucous membranes are moist.     Pharynx: Oropharynx is clear.  Eyes:     Extraocular Movements: Extraocular movements intact.     Pupils: Pupils are equal, round, and reactive to light.  Neck:     Musculoskeletal: Normal range of motion and neck supple.  Cardiovascular:     Rate and Rhythm: Normal rate and regular rhythm.     Heart sounds: Normal heart sounds.  Pulmonary:     Effort: Pulmonary effort is normal.     Breath sounds: Normal breath sounds.  Abdominal:     General: Abdomen is flat. Bowel sounds are normal.     Palpations: Abdomen is soft. There is no mass.     Tenderness: There is abdominal tenderness. There is no guarding or rebound.  Musculoskeletal:        General: No swelling.  Skin:    General: Skin is warm.  Neurological:     General: No focal deficit present.     Mental Status: She is alert and oriented to person, place, and time.  Psychiatric:        Mood and Affect: Mood normal.        Thought Content: Thought content normal.        Judgment: Judgment normal.      ED Treatments / Results  Labs (all labs ordered are listed, but only abnormal results are displayed) Labs Reviewed   COMPREHENSIVE METABOLIC PANEL - Abnormal; Notable for the following components:      Result Value   CO2 21 (*)    Glucose, Bld 106 (*)    All other components within normal limits  CBC WITH  DIFFERENTIAL/PLATELET - Abnormal; Notable for the following components:   RDW 17.2 (*)    All other components within normal limits  POC OCCULT BLOOD, ED - Abnormal; Notable for the following components:   Fecal Occult Bld POSITIVE (*)    All other components within normal limits  LIPASE, BLOOD  PREGNANCY, URINE  URINALYSIS, ROUTINE W REFLEX MICROSCOPIC    EKG None  Radiology No results found.  Procedures Procedures (including critical care time)  Medications Ordered in ED Medications  amLODipine (NORVASC) tablet 10 mg (10 mg Oral Given 04/03/18 1125)  ondansetron (ZOFRAN-ODT) disintegrating tablet 4 mg (4 mg Oral Given 04/03/18 1002)  alum & mag hydroxide-simeth (MAALOX/MYLANTA) 200-200-20 MG/5ML suspension 15 mL (15 mLs Oral Given 04/03/18 1011)  acetaminophen (TYLENOL) tablet 1,000 mg (1,000 mg Oral Given 04/03/18 1031)     Initial Impression / Assessment and Plan / ED Course  I have reviewed the triage vital signs and the nursing notes.  Pertinent labs & imaging results that were available during my care of the patient were reviewed by me and considered in my medical decision making (see chart for details).   43 yo female here with abdominal pain with BRBPR, nausea. Will obtain CMP, lipase, CBC with diff, UA and Upreg. Will give zofran and maalox.  Overall patient not very tender on abdominal exam.  Labs are grossly normal, UA wnl, U preg negative. Patient reports improved symptoms with the maalox and zofran. Tylenol has helped with headache. Rectal exam with no signs of hemorrhoids and occult positive for blood, but given history and exam findings, likely related to anal fissure.  CBC WNL.   Patient instructed to follow-up with her regular doctor for management of her high blood  pressure and given amlodipine 10 mg to take until she is able to follow-up with her PCP.  Patient also given strict return precautions including increased bleeding per rectum.  [place] Jerid Catherman, DO PGY-2, [facility]   Final Clinical Impressions(s) / ED Diagnoses   Final diagnoses:  Gastroenteritis  Anal fissure    ED Discharge Orders         Ordered    amLODipine (NORVASC) 10 MG tablet  Daily,   Status:  Discontinued     04/03/18 1213    amLODipine (NORVASC) 10 MG tablet  Daily     04/03/18 [place], [place], DO 04/03/18 [place], DO 04/03/18 1412

## 2018-04-03 NOTE — ED Triage Notes (Addendum)
Pt reports bright red blood in her vomit and stool for the past four days. Pt reports that she has been having pain in her abdomen and back for four days as well. Pt also reports she has been out of her blood pressure medication for about a month

## 2018-05-18 ENCOUNTER — Other Ambulatory Visit: Payer: Self-pay

## 2018-05-18 ENCOUNTER — Ambulatory Visit (INDEPENDENT_AMBULATORY_CARE_PROVIDER_SITE_OTHER): Payer: Self-pay | Admitting: Primary Care

## 2018-05-18 ENCOUNTER — Encounter (INDEPENDENT_AMBULATORY_CARE_PROVIDER_SITE_OTHER): Payer: Self-pay | Admitting: Primary Care

## 2018-05-18 VITALS — BP 172/94 | HR 76 | Temp 97.5°F | Ht 62.5 in | Wt 174.2 lb

## 2018-05-18 DIAGNOSIS — Z6831 Body mass index (BMI) 31.0-31.9, adult: Secondary | ICD-10-CM

## 2018-05-18 DIAGNOSIS — F10220 Alcohol dependence with intoxication, uncomplicated: Secondary | ICD-10-CM

## 2018-05-18 DIAGNOSIS — F332 Major depressive disorder, recurrent severe without psychotic features: Secondary | ICD-10-CM

## 2018-05-18 DIAGNOSIS — I160 Hypertensive urgency: Secondary | ICD-10-CM

## 2018-05-18 DIAGNOSIS — E669 Obesity, unspecified: Secondary | ICD-10-CM

## 2018-05-18 MED ORDER — HYDROCHLOROTHIAZIDE 25 MG PO TABS: 25.0000 mg | ORAL_TABLET | Freq: Every day | ORAL | 3 refills | Status: DC

## 2018-05-18 MED ORDER — AMLODIPINE BESYLATE 10 MG PO TABS: 10.0000 mg | ORAL_TABLET | Freq: Every day | ORAL | 3 refills | Status: DC

## 2018-05-18 MED FILL — HYDROCHLOROTHIAZIDE 25 MG T: 25 | 30 days supply | Qty: 30 | Fill #0

## 2018-05-18 MED FILL — AMLODIPINE BESYLATE 10 MG T: 10 | 30 days supply | Qty: 30 | Fill #0

## 2018-05-18 NOTE — Progress Notes (Signed)
Pt complains of constipation with no relief taking miralax

## 2018-05-18 NOTE — Progress Notes (Signed)
Acute Office Visit  Subjective:    Patient ID: Gina Shelton, female    DOB: [birth year], 43 y.o.   MRN: [id number]  Chief Complaint  Patient presents with  . Dizziness  . Epistaxis  . Nausea    HPI Patient is in today for an acute visit c/o dizziness, epistaxis and nausea. Out of Bp medications since the middle of Nov. Elevated Bp is associated with her s/s.  Past Medical History:  Diagnosis Date  . Anxiety   . Depression   . Depression   . GERD (gastroesophageal reflux disease)   . Gout    ble  . Migraine   . Migraine     Past Surgical History:  Procedure Laterality Date  . CESAREAN SECTION  infection at incission requiring return to OR x 2  . TUBAL LIGATION      Family History  Problem Relation Age of Onset  . Hypertension Mother   . Arthritis Sister   . Anesthesia problems Neg Hx   . Hypotension Neg Hx   . Malignant hyperthermia Neg Hx   . Pseudochol deficiency Neg Hx     Social History   Socioeconomic History  . Marital status: Single    Spouse name: Not on file  . Number of children: Not on file  . Years of education: Not on file  . Highest education level: Not on file  Occupational History  . Not on file  Social Needs  . Financial resource strain: Not on file  . Food insecurity:    Worry: Not on file    Inability: Not on file  . Transportation needs:    Medical: Not on file    Non-medical: Not on file  Tobacco Use  . Smoking status: Current Every Day Smoker    Packs/day: 1.00    Years: 0.00    Pack years: 0.00    Types: Cigarettes  . Smokeless tobacco: Never Used  Substance and Sexual Activity  . Alcohol use: Yes    Comment: 1 beer daily  . Drug use: No  . Sexual activity: Yes  Lifestyle  . Physical activity:    Days per week: Not on file    Minutes per session: Not on file  . Stress: Not on file  Relationships  . Social connections:    Talks on phone: Not on file    Gets together: Not on file    Attends religious service:  Not on file    Active member of club or organization: Not on file    Attends meetings of clubs or organizations: Not on file    Relationship status: Not on file  . Intimate partner violence:    Fear of current or ex partner: Not on file    Emotionally abused: Not on file    Physically abused: Not on file    Forced sexual activity: Not on file  Other Topics Concern  . Not on file  Social History Narrative   ** Merged History Encounter **        Outpatient Medications Prior to Visit  Medication Sig Dispense Refill  . capsaicin (ZOSTRIX) 0.025 % cream Apply topically 2 (two) times daily. (Patient taking differently: Apply 1 application topically as needed (pain). ) 45 g 0  . ibuprofen (ADVIL,MOTRIN) 200 MG tablet Take 800 mg by mouth every 6 (six) hours as needed for mild pain.     Marland Kitchen amLODipine (NORVASC) 10 MG tablet Take 1 tablet (10 mg total) by mouth  daily for 30 days. 30 tablet 0  . acetaminophen (TYLENOL) 500 MG tablet Take 500 mg by mouth every 6 (six) hours as needed for mild pain or headache.     . albuterol (PROVENTIL HFA;VENTOLIN HFA) 108 (90 Base) MCG/ACT inhaler Inhale 2 puffs into the lungs every 4 (four) hours as needed for wheezing or shortness of breath. 1 Inhaler 0  . Benzocaine-Menthol (CEPACOL EXTRA STRENGTH) 15-2.6 MG LOZG Use as directed 1 lozenge in the mouth or throat every 2 (two) hours as needed. (Patient not taking: Reported on 04/03/2018) 50 lozenge 0  . naproxen (NAPROSYN) 500 MG tablet Take 1 tablet (500 mg total) by mouth 2 (two) times daily with a meal. (Patient not taking: Reported on 04/03/2018) 30 tablet 0   No facility-administered medications prior to visit.     No Known Allergies  Review of Systems  Constitutional: Positive for weight loss.  HENT: Positive for nosebleeds.   Eyes: Negative.   Respiratory: Positive for cough.   Cardiovascular: Negative.   Gastrointestinal: Positive for nausea and vomiting.  Genitourinary: Negative.    Musculoskeletal: Negative.   Skin: Positive for rash.  Neurological: Positive for headaches.  Endo/Heme/Allergies: Negative.   Psychiatric/Behavioral: Positive for depression.       Objective:    Physical Exam  Constitutional: She is oriented to person, place, and time. She appears well-developed and well-nourished.  HENT:  Head: Normocephalic.  Eyes: Pupils are equal, round, and reactive to light.  Neck: Normal range of motion. Neck supple.  Cardiovascular: Normal rate and regular rhythm.  Pulmonary/Chest: Effort normal and breath sounds normal.  Abdominal: Soft. Bowel sounds are normal. She exhibits distension.  Musculoskeletal: Normal range of motion.  Neurological: She is alert and oriented to person, place, and time.  Skin: Skin is warm and dry.  Psychiatric: She has a normal mood and affect.    BP (!) 174/91 (BP Location: Right Arm, Patient Position: Sitting, Cuff Size: Normal)   Pulse 68   Temp (!) 97.5 F (36.4 C) (Oral)   Ht 5' 2.5" (1.588 m)   Wt 174 lb 3.2 oz (79 kg)   LMP 04/22/2018 (Exact Date)   SpO2 91%   BMI 31.35 kg/m  Wt Readings from Last 3 Encounters:  05/18/18 174 lb 3.2 oz (79 kg)  04/03/18 170 lb (77.1 kg)  10/25/17 174 lb 6.4 oz (79.1 kg)    There are no preventive care reminders to display for this patient.  There are no preventive care reminders to display for this patient.   Lab Results  Component Value Date   TSH 1.995 01/02/2016   Lab Results  Component Value Date   WBC 7.8 04/03/2018   HGB 12.2 04/03/2018   HCT 38.7 04/03/2018   MCV 93.9 04/03/2018   PLT 263 04/03/2018   Lab Results  Component Value Date   NA 137 04/03/2018   K 3.7 04/03/2018   CO2 21 (L) 04/03/2018   GLUCOSE 106 (H) 04/03/2018   BUN 6 04/03/2018   CREATININE 0.80 04/03/2018   BILITOT 0.4 04/03/2018   ALKPHOS 92 04/03/2018   AST 19 04/03/2018   ALT 14 04/03/2018   PROT 7.4 04/03/2018   ALBUMIN 3.9 04/03/2018   CALCIUM 9.0 04/03/2018   ANIONGAP 10  04/03/2018   Lab Results  Component Value Date   CHOL 193 01/01/2016   Lab Results  Component Value Date   HDL 66 01/01/2016   Lab Results  Component Value Date   LDLCALC 80 01/01/2016  Lab Results  Component Value Date   TRIG 236 (H) 01/01/2016   Lab Results  Component Value Date   CHOLHDL 2.9 01/01/2016   Lab Results  Component Value Date   HGBA1C 5.2 10/25/2017      1. Hypertensive urgency Non compliant with medication d/w risk of heart attack and stroke   2. Major depressive disorder, recurrent severe without psychotic features ([place]) No medication on file will reassess each visit questioning in relations to alcohol  3. Alcohol dependence with uncomplicated intoxication ([place]) Each visit d/w professional tx AA  4. Obesity (BMI 30.0-34.9) Discussed healthy eating choices and exercise   Assessment & Plan:   Problem List Items Addressed This Visit    None       No orders of the defined types were placed in this encounter.    [name], NP

## 2018-05-21 ENCOUNTER — Telehealth (INDEPENDENT_AMBULATORY_CARE_PROVIDER_SITE_OTHER): Payer: Self-pay | Admitting: Primary Care

## 2018-05-21 NOTE — Telephone Encounter (Signed)
Patient called an left a voicemail stating that PCP gave her a new medication on March 6. Patient states the her face has broken out and would like to know if she should continue taking the medication or discontinue taking it.   Please advise [phone]  Thank you Emmit Pomfret

## 2018-05-21 NOTE — Telephone Encounter (Signed)
Stop medication immediately take benadryl for allergic reaction  Make a rtn visit

## 2018-05-21 NOTE — Telephone Encounter (Signed)
FWD to PCP. Tempestt S Roberts, CMA  

## 2018-05-22 NOTE — Telephone Encounter (Signed)
Patient is aware to stop the medication that was recently added and take benadryl. Call office if no improvement. Nat Christen, CMA

## 2018-05-23 ENCOUNTER — Ambulatory Visit (INDEPENDENT_AMBULATORY_CARE_PROVIDER_SITE_OTHER): Payer: Self-pay

## 2018-05-23 ENCOUNTER — Ambulatory Visit (HOSPITAL_COMMUNITY)
Admission: EM | Admit: 2018-05-23 | Discharge: 2018-05-23 | Disposition: A | Discharge status: Home / Self Care | Payer: Self-pay | Attending: Family Medicine | Admitting: Family Medicine

## 2018-05-23 ENCOUNTER — Encounter (HOSPITAL_COMMUNITY): Payer: Self-pay | Admitting: Emergency Medicine

## 2018-05-23 DIAGNOSIS — S5001XA Contusion of right elbow, initial encounter: Secondary | ICD-10-CM

## 2018-05-23 DIAGNOSIS — F17210 Nicotine dependence, cigarettes, uncomplicated: Secondary | ICD-10-CM

## 2018-05-23 DIAGNOSIS — W19XXXA Unspecified fall, initial encounter: Secondary | ICD-10-CM

## 2018-05-23 DIAGNOSIS — S59911A Unspecified injury of right forearm, initial encounter: Secondary | ICD-10-CM

## 2018-05-23 DIAGNOSIS — M25521 Pain in right elbow: Secondary | ICD-10-CM

## 2018-05-23 DIAGNOSIS — I10 Essential (primary) hypertension: Secondary | ICD-10-CM

## 2018-05-23 MED ORDER — HYDROCODONE-ACETAMINOPHEN 5-325 MG PO TABS: 1.0000 | ORAL_TABLET | Freq: Four times a day (QID) | ORAL | 0 refills | Status: DC | PRN

## 2018-05-23 NOTE — ED Provider Notes (Addendum)
[place]    CSN: [id number] Arrival date & time: 05/23/18  1129     History   Chief Complaint No chief complaint on file.   HPI Gina Shelton is a 43 y.o. female.   Pt states her leg gave out yesterday and she fell and landed on her R wrist.  Her legs gave out on her she thinks because of "gout".  She has had sudden bouts of pain in her knees and ankles in the past.  Patient is now complaining about right elbow pain and wrist pain.  Each of these gets worse when she tries to move it or press over the proximal or distal radius.  She did not hit her head.  She has no leg pain.  She has not had any chest pain or difficulty breathing.     Past Medical History:  Diagnosis Date  . Anxiety   . Depression   . Depression   . GERD (gastroesophageal reflux disease)   . Gout    ble  . Migraine   . Migraine     Patient Active Problem List   Diagnosis Date Noted  . Chest pain 09/27/2017  . Polysubstance abuse ([place]) 09/27/2017  . Hypertensive urgency 09/26/2017  . Obesity (BMI 30.0-34.9) 05/22/2017  . Ovarian cyst 05/22/2017  . Cavernous hemangioma of liver 05/22/2017  . Major depressive disorder, single episode, severe without psychosis ([place]) 12/31/2015  . Major depressive disorder, recurrent severe without psychotic features ([place]) 12/31/2015  . Alcohol dependence ([place]) 06/07/2013  . Cocaine abuse ([place]) 06/07/2013  . DEPRESSION, MAJOR, RECURRENT 08/17/2007  . ANXIETY STATE NOS 11/15/2006    Past Surgical History:  Procedure Laterality Date  . CESAREAN SECTION  infection at incission requiring return to OR x 2  . TUBAL LIGATION      OB History    Gravida  3   Para  3   Term  0   Preterm  0   AB  0   Living        SAB  0   TAB  0   Ectopic  0   Multiple      Live Births               Home Medications    Prior to Admission medications   Medication Sig Start Date End Date Taking? Authorizing Provider  amLODipine (NORVASC) 10  MG tablet Take 1 tablet (10 mg total) by mouth daily for 30 days. 05/18/18 06/17/18  [name], NP  capsaicin (ZOSTRIX) 0.025 % cream Apply topically 2 (two) times daily. Patient taking differently: Apply 1 application topically as needed (pain).  09/27/17   Geradine Girt, DO  hydrochlorothiazide (HYDRODIURIL) 25 MG tablet Take 1 tablet (25 mg total) by mouth daily. 05/18/18   [name], NP  HYDROcodone-acetaminophen (NORCO) 5-325 MG tablet Take 1 tablet by mouth every 6 (six) hours as needed for moderate pain. 05/23/18   Robyn Haber, MD  ibuprofen (ADVIL,MOTRIN) 200 MG tablet Take 800 mg by mouth every 6 (six) hours as needed for mild pain.     [provider]    Family History Family History  Problem Relation Age of Onset  . Hypertension Mother   . Arthritis Sister   . Anesthesia problems Neg Hx   . Hypotension Neg Hx   . Malignant hyperthermia Neg Hx   . Pseudochol deficiency Neg Hx     Social History Social History   Tobacco Use  .  Smoking status: Current Every Day Smoker    Packs/day: 1.00    Years: 0.00    Pack years: 0.00    Types: Cigarettes  . Smokeless tobacco: Never Used  Substance Use Topics  . Alcohol use: Yes    Comment: 1 beer daily  . Drug use: No     Allergies   Patient has no known allergies.   Review of Systems Review of Systems   Physical Exam Triage Vital Signs ED Triage Vitals  Enc Vitals Group     BP 05/23/18 1140 (!) 151/98     Pulse Rate 05/23/18 1140 100     Resp 05/23/18 1140 16     Temp 05/23/18 1140 97.8 F (36.6 C)     Temp src --      SpO2 05/23/18 1140 99 %     Weight --      Height --      Head Circumference --      Peak Flow --      Pain Score 05/23/18 1141 8     Pain Loc --      Pain Edu? --      Excl. in [place]? --    No data found.  Updated Vital Signs BP (!) 151/98   Pulse 100   Temp 97.8 F (36.6 C)   Resp 16   SpO2 99%    Physical Exam Vitals signs and nursing note reviewed.   Constitutional:      Appearance: Normal appearance.  HENT:     Head: Normocephalic and atraumatic.  Neck:     Musculoskeletal: Normal range of motion and neck supple.  Pulmonary:     Effort: Pulmonary effort is normal.  Musculoskeletal:        General: Swelling, tenderness and signs of injury present.     Comments: Patient is holding her right arm in flexion and has tenderness over the wrist (radius) and proximal radius.  She refuses to move her wrist or elbow because of pain.  Skin:    General: Skin is warm and dry.  Neurological:     General: No focal deficit present.     Mental Status: She is alert and oriented to person, place, and time.  Psychiatric:        Mood and Affect: Mood normal.        Thought Content: Thought content normal.      UC Treatments / Results  Labs (all labs ordered are listed, but only abnormal results are displayed) Labs Reviewed - No data to display  EKG None  Radiology Dg Forearm Right  Result Date: 05/23/2018 CLINICAL DATA:  Patient states that she fell last night fall on her right side injuring her right arm, pain along the medial side from elbow to the wrist. No Hx fall EXAM: RIGHT FOREARM - 2 VIEW COMPARISON:  None. FINDINGS: No fracture of the radius or ulna. The elbow joint and the wrist joint appear normal on two views. IMPRESSION: No fracture or dislocation. Electronically Signed   By: Suzy Bouchard M.D.   On: 05/23/2018 12:50    Procedures Procedures (including critical care time)  Medications Ordered in UC Medications - No data to display  Initial Impression / Assessment and Plan / UC Course  I have reviewed the triage vital signs and the nursing notes.  Pertinent labs & imaging results that were available during my care of the patient were reviewed by me and considered in my medical decision making (see  chart for details).    Final Clinical Impressions(s) / UC Diagnoses   Final diagnoses:  None     Discharge  Instructions     Follow up with Dr. Stann Mainland  The radiologist is calling the x-ray negative, yet I suspect there may be a hairline fracture    ED Prescriptions    Medication Sig Dispense Auth. Provider   HYDROcodone-acetaminophen (NORCO) 5-325 MG tablet Take 1 tablet by mouth every 6 (six) hours as needed for moderate pain. 12 tablet Robyn Haber, MD     Controlled Substance Prescriptions [place] Controlled Substance Registry consulted? Not Applicable   Robyn Haber, MD 05/23/18 1319    Robyn Haber, MD 05/29/18 [phone]

## 2018-05-23 NOTE — ED Notes (Signed)
Ortho has been paged and are on their way.

## 2018-05-23 NOTE — Discharge Instructions (Addendum)
Follow up with Dr. Stann Mainland  The radiologist is calling the x-ray negative, yet I suspect there may be a hairline fracture

## 2018-05-23 NOTE — ED Triage Notes (Signed)
Pt states her leg gave out yesterday and she fell and landed on her R wrist.

## 2018-05-23 NOTE — Progress Notes (Signed)
Orthopedic Tech Progress Note Patient Details:  Gina Shelton 05/18/1975 [id number]  Ortho Devices Type of Ortho Device: Sugartong splint Ortho Device/Splint Location: Right Hand  Ortho Device/Splint Interventions: Ordered, Application, Adjustment   Post Interventions Patient Tolerated: Well Instructions Provided: Adjustment of device, Care of device   Arwin Bisceglia J Chistian Kasler 05/23/2018, 1:26 PM

## 2018-05-29 ENCOUNTER — Telehealth (HOSPITAL_COMMUNITY): Payer: Self-pay | Admitting: Emergency Medicine

## 2018-05-29 NOTE — Telephone Encounter (Signed)
Thank you.  Done.

## 2018-06-01 ENCOUNTER — Ambulatory Visit (INDEPENDENT_AMBULATORY_CARE_PROVIDER_SITE_OTHER): Payer: Self-pay

## 2018-06-01 ENCOUNTER — Telehealth (INDEPENDENT_AMBULATORY_CARE_PROVIDER_SITE_OTHER): Payer: Self-pay | Admitting: Primary Care

## 2018-06-01 ENCOUNTER — Other Ambulatory Visit: Payer: Self-pay

## 2018-06-01 VITALS — BP 117/78 | HR 87 | Temp 98.2°F

## 2018-06-01 DIAGNOSIS — Z0130 Encounter for examination of blood pressure without abnormal findings: Secondary | ICD-10-CM

## 2018-06-01 NOTE — Telephone Encounter (Signed)
Patient is not having any breathing issues. She is aware to stop medication and take benadryl. Can not schedule to be re-evaluated for BP at this time due to new COVID-19 restrictions and all non urgent visits being cancelled. Nat Christen, CMA

## 2018-06-01 NOTE — Telephone Encounter (Signed)
Any breathing problems if face is breaking out stop medication take benadryl 25 mg for allergic reaction Return to office to re-eval Bp

## 2018-06-01 NOTE — Telephone Encounter (Signed)
Patient stopped taking her hydrochlorothiazide (HYDRODIURIL) 25 MG tablet  And is still taking amLODipine (NORVASC) 10 MG tablet but patient is still experiencing breakouts in her face. Please f/u with pt.

## 2018-07-25 ENCOUNTER — Encounter (HOSPITAL_COMMUNITY): Payer: Self-pay

## 2018-07-25 ENCOUNTER — Ambulatory Visit (HOSPITAL_COMMUNITY)
Admission: EM | Admit: 2018-07-25 | Discharge: 2018-07-25 | Disposition: A | Discharge status: Home / Self Care | Payer: Medicaid Other | Attending: Family Medicine | Admitting: Family Medicine

## 2018-07-25 ENCOUNTER — Other Ambulatory Visit: Payer: Self-pay

## 2018-07-25 ENCOUNTER — Ambulatory Visit (INDEPENDENT_AMBULATORY_CARE_PROVIDER_SITE_OTHER): Payer: Medicaid Other

## 2018-07-25 DIAGNOSIS — M79631 Pain in right forearm: Secondary | ICD-10-CM

## 2018-07-25 NOTE — ED Provider Notes (Signed)
[place]   [id number] 07/25/18 Arrival Time: 7858  ASSESSMENT & PLAN:  1. Right forearm pain    I have personally viewed the imaging studies ordered this visit. No fractures noted.  D/C plaster splint. Return to work without restrictions; note provided. Does request wrist splint to use while at work if needed; works as a Scientist, water quality. I feel this is reasonable. OTC analgesics as needed.  Reviewed expectations re: course of current medical issues. Questions answered. Outlined signs and symptoms indicating need for more acute intervention. Patient verbalized understanding. After Visit Summary given.  SUBJECTIVE: History from: patient. Seen here on 05/23/2018; note reviewed. Unable to afford orthopaedic follow up.  Gina Shelton is a 43 y.o. female who reports L forearm pain. Has been wearing forearm splint since previous visit. Could not afford orthopaedic follow up. Feeling better. Here for re-evaluation. Occasional dull pain. No extremity sensation changes or weakness. No specific aggravating or alleviating factors reported.   Past Surgical History:  Procedure Laterality Date  . CESAREAN SECTION  infection at incission requiring return to OR x 2  . TUBAL LIGATION      ROS: As per HPI. All other systems negative.   OBJECTIVE:  Vitals:   07/25/18 1235 07/25/18 1237  BP:  (!) 151/98  Pulse:  70  Resp:  16  Temp:  98.5 F (36.9 C)  TempSrc:  Oral  SpO2:  100%  Weight: 78 kg     General appearance: alert; no distress Extremities: . RUE: warm and well perfused; no specific point tenderness over forearm; without gross deformities; with no swelling; with no bruising; ROM: normal without reported discomfort CV: brisk extremity capillary refill of RUE; 2+ radial pulse of RUE. Skin: warm and dry; no visible rashes Neurologic: gait normal; normal reflexes of RUE and LUE; normal sensation of RUE and LUE; normal strength of RUE and LUE Psychological: alert and  cooperative; normal mood and affect  No Known Allergies  Imaging: Dg Forearm Right  Result Date: 07/25/2018 CLINICAL DATA:  Pain following injury approximately 1 month prior EXAM: RIGHT FOREARM - 2 VIEW COMPARISON:  May 23, 2018 FINDINGS: Frontal and lateral views obtained. No fracture or dislocation. Joint spaces appear normal. No erosive change. No abnormal periosteal reaction. IMPRESSION: No fracture or dislocation.  No evident arthropathy. Electronically Signed   By: Lowella Grip III M.D.   On: 07/25/2018 13:28   Past Medical History:  Diagnosis Date  . Anxiety   . Depression   . Depression   . GERD (gastroesophageal reflux disease)   . Gout    ble  . Migraine   . Migraine    Social History   Socioeconomic History  . Marital status: Single    Spouse name: Not on file  . Number of children: Not on file  . Years of education: Not on file  . Highest education level: Not on file  Occupational History  . Not on file  Social Needs  . Financial resource strain: Not on file  . Food insecurity:    Worry: Not on file    Inability: Not on file  . Transportation needs:    Medical: Not on file    Non-medical: Not on file  Tobacco Use  . Smoking status: Current Every Day Smoker    Packs/day: 1.00    Years: 0.00    Pack years: 0.00    Types: Cigarettes  . Smokeless tobacco: Never Used  Substance and Sexual Activity  . Alcohol use:  Yes    Comment: 1 beer daily  . Drug use: No  . Sexual activity: Yes  Lifestyle  . Physical activity:    Days per week: Not on file    Minutes per session: Not on file  . Stress: Not on file  Relationships  . Social connections:    Talks on phone: Not on file    Gets together: Not on file    Attends religious service: Not on file    Active member of club or organization: Not on file    Attends meetings of clubs or organizations: Not on file    Relationship status: Not on file  Other Topics Concern  . Not on file  Social History  Narrative   ** Merged History Encounter **       Family History  Problem Relation Age of Onset  . Hypertension Mother   . Arthritis Sister   . Anesthesia problems Neg Hx   . Hypotension Neg Hx   . Malignant hyperthermia Neg Hx   . Pseudochol deficiency Neg Hx    Past Surgical History:  Procedure Laterality Date  . CESAREAN SECTION  infection at incission requiring return to OR x 2  . TUBAL LIGATION        Vanessa Kick, MD 08/06/18 [phone]

## 2018-07-25 NOTE — ED Triage Notes (Signed)
Pt states she wants to have her right arm rechecked . Pt states she can not afford to go see the Orthopedic. Pt states she's in pain.

## 2018-09-08 ENCOUNTER — Other Ambulatory Visit: Payer: Self-pay

## 2018-09-08 ENCOUNTER — Ambulatory Visit (HOSPITAL_COMMUNITY)
Admission: EM | Admit: 2018-09-08 | Discharge: 2018-09-08 | Disposition: A | Discharge status: Home / Self Care | Payer: Medicaid Other | Attending: Physician Assistant | Admitting: Physician Assistant

## 2018-09-08 ENCOUNTER — Encounter (HOSPITAL_COMMUNITY): Payer: Self-pay | Admitting: Emergency Medicine

## 2018-09-08 DIAGNOSIS — R0789 Other chest pain: Secondary | ICD-10-CM

## 2018-09-08 DIAGNOSIS — J302 Other seasonal allergic rhinitis: Secondary | ICD-10-CM

## 2018-09-08 DIAGNOSIS — Z720 Tobacco use: Secondary | ICD-10-CM

## 2018-09-08 MED ORDER — LEVOCETIRIZINE DIHYDROCHLORIDE 5 MG PO TABS: 5.0000 mg | ORAL_TABLET | Freq: Every evening | ORAL | 0 refills | Status: DC

## 2018-09-08 MED ORDER — MELOXICAM 7.5 MG PO TABS: 7.5000 mg | ORAL_TABLET | Freq: Every day | ORAL | 0 refills | Status: DC

## 2018-09-08 MED ORDER — FLUTICASONE PROPIONATE 50 MCG/ACT NA SUSP: 2.0000 | Freq: Every day | NASAL | 0 refills | Status: DC

## 2018-09-08 MED ORDER — ALBUTEROL SULFATE HFA 108 (90 BASE) MCG/ACT IN AERS
INHALATION_SPRAY | RESPIRATORY_TRACT | Status: AC
  Filled 2018-09-08: qty 6.7

## 2018-09-08 MED ORDER — ALBUTEROL SULFATE HFA 108 (90 BASE) MCG/ACT IN AERS
2.0000 | INHALATION_SPRAY | Freq: Once | RESPIRATORY_TRACT | Status: AC
  Administered 2018-09-08: 2 via RESPIRATORY_TRACT

## 2018-09-08 NOTE — Discharge Instructions (Signed)
Start xyzal and flonase for seasonal allergies. Albuterol as needed for wheezing. As discussed, currently symptoms most likely due to allergies. If develop cold symptoms such as cough, fever, chills, body aches, please self quarantine for 7 days since symptoms started AND more than 72 hours of no fever and cough prior to leaving the house. You can call COVID hotline [phone]) or use [facility] E visit online if you have questions, or symptoms worsens to determine where you should seek care. If experiencing shortness of breath, trouble breathing, call 911 and provide them with your current situation.   Start Mobic. Do not take ibuprofen (motrin/advil)/ naproxen (aleve) while on mobic. This can help with the left sided muscle pain. However, as discussed, may take a few weeks to completely resolve. If experiencing worsening chest pain with shortness of breath, nausea/vomiting, sweating, go to the ED for further evaluation needed.

## 2018-09-08 NOTE — ED Triage Notes (Signed)
Per pt she has been having allergies real bad with sneezing and itchy eyes for about 1 week. Pt also saying her chest on her left side is sore with touch and movement. She has had no fevers or chills. Some coughing when she lays down.

## 2018-09-08 NOTE — ED Provider Notes (Signed)
[facility]    CSN: [id number] Arrival date & time: 09/08/18  1311     History   Chief Complaint Chief Complaint  Patient presents with  . Allergies    HPI Gina Shelton is a 43 y.o. female.   42 year old female comes in for 1 week history allergy symptoms.  She has been having sneezing, itchy eyes, rhinorrhea, nasal congestion.  Has throat itching causing cough.  Denies fever, chills, night sweats.  Denies body aches.  Denies shortness of breath though has mild wheezing at nighttime.  Denies abdominal pain, nausea, vomiting.  Denies loss of taste or smell.  States out of albuterol to use at nighttime, and also requesting nebulizer machine to help with symptoms.  She has no sick contact.  Working at [employer] at this time, no obvious COVID contact.  Current everyday smoker, 0.5 to 1 pack/day.  She was also noticed left-sided chest pain for the past few days, pain only occurs with palpation of the chest, movement of the shoulder.  No associated shortness of breath, nausea, vomiting, diaphoresis, weakness, dizziness, syncope.  Denies heavy lifting, strenuous activity.  Denies drug use.  Denies personal or family history of heart disease.     Past Medical History:  Diagnosis Date  . Anxiety   . Depression   . Depression   . GERD (gastroesophageal reflux disease)   . Gout    ble  . Migraine   . Migraine     Patient Active Problem List   Diagnosis Date Noted  . Chest pain 09/27/2017  . Polysubstance abuse ([place]) 09/27/2017  . Hypertensive urgency 09/26/2017  . Obesity (BMI 30.0-34.9) 05/22/2017  . Ovarian cyst 05/22/2017  . Cavernous hemangioma of liver 05/22/2017  . Major depressive disorder, single episode, severe without psychosis ([place]) 12/31/2015  . 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 Alcohol dependence ([place]) 06/07/2013  . Cocaine abuse ([place]) 06/07/2013  . DEPRESSION, MAJOR, RECURRENT 08/17/2007   . ANXIETY STATE NOS 11/15/2006    Past Surgical History:  Procedure Laterality Date  . CESAREAN SECTION  infection at incission requiring return to OR x 2  . TUBAL LIGATION      OB History    Gravida  3   Para  3   Term  0   Preterm  0   AB  0   Living        SAB  0   TAB  0   Ectopic  0   Multiple      Live Births               Home Medications    Prior to Admission medications   Medication Sig Start Date End Date Taking? Authorizing Provider  amLODipine (NORVASC) 10 MG tablet Take 1 tablet (10 mg total) by mouth daily for 30 days. 05/18/18 06/17/18  [name], NP  capsaicin (ZOSTRIX) 0.025 % cream Apply topically 2 (two) times daily. Patient taking differently: Apply 1 application topically as needed (pain).  09/27/17   Geradine Girt, DO  fluticasone (FLONASE) 50 MCG/ACT nasal spray Place 2 sprays into both nostrils daily. 09/08/18   Tasia Catchings, Amy V, PA-C  hydrochlorothiazide (HYDRODIURIL) 25 MG tablet Take 1 tablet (25 mg total) by mouth daily. 05/18/18   [name], NP  HYDROcodone-acetaminophen (NORCO) 5-325 MG tablet Take 1 tablet by mouth every 6 (six) hours as needed for moderate pain. 05/23/18   Robyn Haber, MD  ibuprofen (ADVIL,MOTRIN) 200 MG tablet Take 800 mg by mouth every 6 (six) hours as needed for mild pain.     [provider]  levocetirizine (XYZAL) 5 MG tablet Take 1 tablet (5 mg total) by mouth every evening. 09/08/18   Tasia Catchings, Amy V, PA-C  meloxicam (MOBIC) 7.5 MG tablet Take 1 tablet (7.5 mg total) by mouth daily. 09/08/18   Ok Edwards, PA-C    Family History Family History  Problem Relation Age of Onset  . Hypertension Mother   . Arthritis Sister   . Anesthesia problems Neg Hx   . Hypotension Neg Hx   . Malignant hyperthermia Neg Hx   . Pseudochol deficiency Neg Hx     Social History Social History   Tobacco Use  . Smoking status: Current Every Day Smoker    Packs/day: 1.00    Years: 0.00    Pack years: 0.00     Types: Cigarettes  . Smokeless tobacco: Never Used  Substance Use Topics  . Alcohol use: Yes    Comment: 1 beer daily  . Drug use: No     Allergies   Patient has no known allergies.   Review of Systems Review of Systems  Reason unable to perform ROS: See HPI as above.     Physical Exam Triage Vital Signs ED Triage Vitals [09/08/18 1339]  Enc Vitals Group     BP (!) 171/105     Pulse Rate 93     Resp 16     Temp 98.7 F (37.1 C)     Temp Source Oral     SpO2 98 %     Weight      Height      Head Circumference      Peak Flow      Pain Score      Pain Loc      Pain Edu?      Excl. in [place]?    No data found.  Updated Vital Signs BP (!) 171/105 (BP Location: Right Arm)   Pulse 93   Temp 98.7 F (37.1 C) (Oral)   Resp 16   SpO2 98%   Physical Exam Constitutional:      General: She is not in acute distress.    Appearance: Normal appearance. She is not ill-appearing, toxic-appearing or diaphoretic.  HENT:     Head: Normocephalic and atraumatic.     Mouth/Throat:     Mouth: Mucous membranes are moist.     Pharynx: Oropharynx is clear. Uvula midline.  Eyes:     Extraocular Movements: Extraocular movements intact.     Conjunctiva/sclera: Conjunctivae normal.     Pupils: Pupils are equal, round, and reactive to light.  Neck:     Musculoskeletal: Normal range of motion and neck supple.  Cardiovascular:     Rate and Rhythm: Normal rate and regular rhythm.     Heart sounds: Normal heart sounds. No murmur. No friction rub. No gallop.   Pulmonary:     Effort: Pulmonary effort is normal. No accessory muscle usage, prolonged expiration, respiratory distress or retractions.     Comments: Lungs clear to auscultation without adventitious lung sounds. Chest:     Chest wall: Tenderness (left lateral chest) present.  Musculoskeletal:     Comments: Mild tenderness to palpation of the left shoulder. Full ROM of shoulder. Strength normal and equal bilaterally. Sensation  intact and equal bilaterally. Radial pulse 2+, cap refill <2s  Skin:    General: Skin  is warm and dry.  Neurological:     General: No focal deficit present.     Mental Status: She is alert and oriented to person, place, and time.      UC Treatments / Results  Labs (all labs ordered are listed, but only abnormal results are displayed) Labs Reviewed - No data to display  EKG None  Radiology No results found.  Procedures Procedures (including critical care time)  Medications Ordered in UC Medications  albuterol (VENTOLIN HFA) 108 (90 Base) MCG/ACT inhaler 2 puff (2 puffs Inhalation Given 09/08/18 1411)  albuterol (VENTOLIN HFA) 108 (90 Base) MCG/ACT inhaler (has no administration in time range)    Initial Impression / Assessment and Plan / UC Course  I have reviewed the triage vital signs and the nursing notes.  Pertinent labs & imaging results that were available during my care of the patient were reviewed by me and considered in my medical decision making (see chart for details).    History and exam more consistent with allergic rhinitis. Flonase and antihistamine as directed. Continue to monitor symptoms. Discussed if develop more viral symptoms such as worsening cough, fever, chills, body aches, to self quarantine. Self quarantine guidelines discussed. Return precautions given. Patient expresses understanding and agrees to plan.  Chest wall pain reproducible on exam.  Lower suspicion for ACS.  Will provide NSAIDs.  Return precautions given.  Patient expresses understanding and agrees with plan.  Final Clinical Impressions(s) / UC Diagnoses   Final diagnoses:  Seasonal allergic rhinitis, unspecified trigger  Left-sided chest wall pain    ED Prescriptions    Medication Sig Dispense Auth. Provider   levocetirizine (XYZAL) 5 MG tablet Take 1 tablet (5 mg total) by mouth every evening. 30 tablet Yu, Amy V, PA-C   fluticasone (FLONASE) 50 MCG/ACT nasal spray Place 2 sprays  into both nostrils daily. 1 g Yu, Amy V, PA-C   meloxicam (MOBIC) 7.5 MG tablet Take 1 tablet (7.5 mg total) by mouth daily. 15 tablet Tobin Chad, [place] 09/09/18 [phone]

## 2018-09-10 MED FILL — LEVOCETIRIZINE 5 MG TABLET: 5 | 30 days supply | Qty: 30 | Fill #0

## 2018-09-10 MED FILL — FLUTICASONE PROP 50 MCG SPR: 50 | 30 days supply | Qty: 16 | Fill #0

## 2018-09-10 MED FILL — MELOXICAM 7.5 MG TABLET: 7.5 | 15 days supply | Qty: 15 | Fill #0

## 2018-10-07 ENCOUNTER — Encounter (HOSPITAL_COMMUNITY): Payer: Self-pay

## 2018-10-07 ENCOUNTER — Emergency Department (HOSPITAL_COMMUNITY)
Admission: EM | Admit: 2018-10-07 | Discharge: 2018-10-07 | Disposition: A | Discharge status: Home / Self Care | Payer: Medicaid Other | Attending: Emergency Medicine | Admitting: Emergency Medicine

## 2018-10-07 ENCOUNTER — Other Ambulatory Visit: Payer: Self-pay

## 2018-10-07 DIAGNOSIS — Y999 Unspecified external cause status: Secondary | ICD-10-CM | POA: Diagnosis not present

## 2018-10-07 DIAGNOSIS — X118XXA Contact with other hot tap-water, initial encounter: Secondary | ICD-10-CM | POA: Insufficient documentation

## 2018-10-07 DIAGNOSIS — Z79899 Other long term (current) drug therapy: Secondary | ICD-10-CM | POA: Diagnosis not present

## 2018-10-07 DIAGNOSIS — F17210 Nicotine dependence, cigarettes, uncomplicated: Secondary | ICD-10-CM | POA: Diagnosis not present

## 2018-10-07 DIAGNOSIS — T24212A Burn of second degree of left thigh, initial encounter: Secondary | ICD-10-CM | POA: Diagnosis present

## 2018-10-07 DIAGNOSIS — Y92000 Kitchen of unspecified non-institutional (private) residence as  the place of occurrence of the external cause: Secondary | ICD-10-CM | POA: Insufficient documentation

## 2018-10-07 DIAGNOSIS — Y9389 Activity, other specified: Secondary | ICD-10-CM | POA: Diagnosis not present

## 2018-10-07 MED ORDER — OXYCODONE-ACETAMINOPHEN 5-325 MG PO TABS: 1.0000 | ORAL_TABLET | ORAL | 0 refills | Status: DC | PRN

## 2018-10-07 MED ORDER — IBUPROFEN 200 MG PO TABS
600.0000 mg | ORAL_TABLET | Freq: Once | ORAL | Status: AC
  Administered 2018-10-07: 19:00:00 600 mg via ORAL
  Filled 2018-10-07: qty 3

## 2018-10-07 MED ORDER — OXYCODONE-ACETAMINOPHEN 5-325 MG PO TABS
1.0000 | ORAL_TABLET | Freq: Once | ORAL | Status: AC
  Administered 2018-10-07: 1 via ORAL
  Filled 2018-10-07: qty 1

## 2018-10-07 MED ORDER — SILVER SULFADIAZINE 1 % EX CREA: 1.0000 "application " | TOPICAL_CREAM | Freq: Every day | CUTANEOUS | 0 refills | Status: DC

## 2018-10-07 NOTE — Discharge Instructions (Signed)
Take 600 mg of ibuprofen every 6 hours as needed for pain in addition to percocet as needed.
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
## 2018-10-08 MED FILL — SSD 1% CREAM: 1 | 30 days supply | Qty: 50 | Fill #0

## 2018-10-08 MED FILL — OXYCODONE-ACETAMINOPHEN 5-3: 5-325 | 2 days supply | Qty: 10 | Fill #0

## 2018-10-12 ENCOUNTER — Ambulatory Visit (HOSPITAL_COMMUNITY)
Admission: EM | Admit: 2018-10-12 | Discharge: 2018-10-12 | Disposition: A | Discharge status: Home / Self Care | Payer: Medicaid Other | Attending: Emergency Medicine | Admitting: Emergency Medicine

## 2018-10-12 ENCOUNTER — Encounter (HOSPITAL_COMMUNITY): Payer: Self-pay | Admitting: Emergency Medicine

## 2018-10-12 ENCOUNTER — Other Ambulatory Visit: Payer: Self-pay

## 2018-10-12 DIAGNOSIS — T24212D Burn of second degree of left thigh, subsequent encounter: Secondary | ICD-10-CM | POA: Diagnosis not present

## 2018-10-12 MED ORDER — SILVER SULFADIAZINE 1 % EX CREA
TOPICAL_CREAM | CUTANEOUS | Status: AC
  Filled 2018-10-12: qty 85

## 2018-10-12 MED ORDER — SILVER SULFADIAZINE 1 % EX CREA: TOPICAL_CREAM | Freq: Every day | CUTANEOUS | Status: DC

## 2018-10-12 MED ORDER — MELOXICAM 7.5 MG PO TABS: 7.5000 mg | ORAL_TABLET | Freq: Every day | ORAL | 0 refills | Status: DC
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
## 2018-10-12 NOTE — Discharge Instructions (Addendum)
Use Silvadene 1-2 times daily to prevent infection and control pain. May take naproxen twice daily as prescribed: Do not take ibuprofen, Mobic, BC powders with this. Return in 3 days for wound recheck. Go to ER if you develop worsening pain, purulent/malodorous discharge, fever.

## 2018-10-15 ENCOUNTER — Ambulatory Visit (HOSPITAL_COMMUNITY)
Admission: EM | Admit: 2018-10-15 | Discharge: 2018-10-15 | Disposition: A | Discharge status: Home / Self Care | Payer: Medicaid Other | Attending: Emergency Medicine | Admitting: Emergency Medicine

## 2018-10-15 ENCOUNTER — Other Ambulatory Visit: Payer: Self-pay

## 2018-10-15 ENCOUNTER — Encounter (HOSPITAL_COMMUNITY): Payer: Self-pay | Admitting: *Deleted

## 2018-10-15 DIAGNOSIS — Z5189 Encounter for other specified aftercare: Secondary | ICD-10-CM | POA: Diagnosis not present

## 2018-10-15 DIAGNOSIS — X101XXD Contact with hot food, subsequent encounter: Secondary | ICD-10-CM

## 2018-10-15 DIAGNOSIS — T24212D Burn of second degree of left thigh, subsequent encounter: Secondary | ICD-10-CM | POA: Diagnosis not present

## 2018-10-15 MED ORDER — SILVER SULFADIAZINE 1 % EX CREA
TOPICAL_CREAM | Freq: Once | CUTANEOUS | Status: AC
  Administered 2018-10-15: 13:00:00 via TOPICAL

## 2018-10-15 MED ORDER — CEPHALEXIN 500 MG PO CAPS: 500.0000 mg | ORAL_CAPSULE | Freq: Three times a day (TID) | ORAL | 0 refills | Status: AC

## 2018-10-15 MED FILL — CEPHALEXIN 500 MG CAPSULE: 500 | 7 days supply | Qty: 21 | Fill #0

## 2018-10-15 NOTE — Discharge Instructions (Addendum)
Continue to cleanse daily with soap and water.  Keep covered to keep clean. Apply ointment twice a day.  May try using ace wrap to help with compression and support.  Try to keep blisters intact, the broken areas may slough the dead skin.  May try following up with wound care as able. Continue with meloxicam to help some with pain. Cool compresses may also help.  I have sent for antibiotics to be started as well.  If symptoms worsen or do not improve in the next week to return to be seen or to follow up with your PCP.
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
## 2018-10-16 ENCOUNTER — Encounter (INDEPENDENT_AMBULATORY_CARE_PROVIDER_SITE_OTHER): Payer: Self-pay | Admitting: Primary Care

## 2018-10-16 ENCOUNTER — Telehealth (INDEPENDENT_AMBULATORY_CARE_PROVIDER_SITE_OTHER): Payer: Medicaid Other | Admitting: Primary Care

## 2018-10-16 DIAGNOSIS — T3110 Burns involving 10-19% of body surface with 0% to 9% third degree burns: Secondary | ICD-10-CM

## 2018-10-16 DIAGNOSIS — K219 Gastro-esophageal reflux disease without esophagitis: Secondary | ICD-10-CM

## 2018-10-16 DIAGNOSIS — Z720 Tobacco use: Secondary | ICD-10-CM | POA: Diagnosis not present

## 2018-10-16 DIAGNOSIS — I159 Secondary hypertension, unspecified: Secondary | ICD-10-CM

## 2018-10-16 MED ORDER — OMEPRAZOLE 20 MG PO CPDR: 20.0000 mg | DELAYED_RELEASE_CAPSULE | Freq: Every day | ORAL | 3 refills | Status: DC

## 2018-10-16 MED FILL — OMEPRAZOLE 20 MG CAP: 20 | 90 days supply | Qty: 90 | Fill #0
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
## 2018-10-17 ENCOUNTER — Other Ambulatory Visit (INDEPENDENT_AMBULATORY_CARE_PROVIDER_SITE_OTHER): Payer: Self-pay | Admitting: Primary Care

## 2018-10-17 ENCOUNTER — Telehealth (INDEPENDENT_AMBULATORY_CARE_PROVIDER_SITE_OTHER): Payer: Self-pay

## 2018-10-17 MED ORDER — SILVER SULFADIAZINE 1 % EX CREA: 1.0000 "application " | TOPICAL_CREAM | Freq: Every day | CUTANEOUS | 0 refills | Status: DC

## 2018-10-17 NOTE — Telephone Encounter (Signed)
FWD to PCP. Canuto Kingston S Gregroy Dombkowski, CMA  

## 2018-10-17 NOTE — Telephone Encounter (Signed)
She did not request refill sent in silver derm cream

## 2018-10-17 NOTE — Telephone Encounter (Signed)
Patient called to request a medication refill for silver sulfADIAZINE (SILVADENE) 1 % cream Patient states she only has 2 day worth of cream.   Please advice [phone]   Patient uses [facility]   Thank you Whitney Post

## 2018-10-22 ENCOUNTER — Telehealth (INDEPENDENT_AMBULATORY_CARE_PROVIDER_SITE_OTHER): Payer: Self-pay

## 2018-10-22 ENCOUNTER — Other Ambulatory Visit (INDEPENDENT_AMBULATORY_CARE_PROVIDER_SITE_OTHER): Payer: Self-pay | Admitting: Primary Care

## 2018-10-22 MED ORDER — SILVER SULFADIAZINE 1 % EX CREA: 1.0000 "application " | TOPICAL_CREAM | Freq: Every day | CUTANEOUS | 0 refills | Status: DC

## 2018-10-22 MED FILL — SSD 1% CREAM: 1 | 30 days supply | Qty: 50 | Fill #0

## 2018-10-22 NOTE — Telephone Encounter (Signed)
Patient called to inquire on refill of topical ointment for her wound. Prescription was printed. Please resend electronically to [facility]. Nat Christen, CMA

## 2018-10-26 ENCOUNTER — Telehealth (INDEPENDENT_AMBULATORY_CARE_PROVIDER_SITE_OTHER): Payer: Self-pay

## 2018-10-26 NOTE — Telephone Encounter (Signed)
Patient called to request medication refill for meloxicam (MOBIC) 7.5 MG tablet    Patient uses [facility]    Please advice [phone]  Thank you Whitney Post
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
## 2018-10-26 NOTE — Telephone Encounter (Signed)
FWD to PCP. Shunna Mikaelian S Elmus Mathes, CMA  

## 2018-10-30 ENCOUNTER — Encounter (HOSPITAL_BASED_OUTPATIENT_CLINIC_OR_DEPARTMENT_OTHER): Payer: Medicaid Other | Attending: Internal Medicine

## 2018-10-30 ENCOUNTER — Other Ambulatory Visit: Payer: Self-pay

## 2018-10-30 DIAGNOSIS — Y9389 Activity, other specified: Secondary | ICD-10-CM | POA: Diagnosis not present

## 2018-10-30 DIAGNOSIS — F17210 Nicotine dependence, cigarettes, uncomplicated: Secondary | ICD-10-CM | POA: Insufficient documentation

## 2018-10-30 DIAGNOSIS — T24222A Burn of second degree of left knee, initial encounter: Secondary | ICD-10-CM | POA: Insufficient documentation

## 2018-10-30 DIAGNOSIS — X19XXXA Contact with other heat and hot substances, initial encounter: Secondary | ICD-10-CM | POA: Diagnosis not present

## 2018-10-30 DIAGNOSIS — I10 Essential (primary) hypertension: Secondary | ICD-10-CM | POA: Diagnosis not present

## 2018-10-30 DIAGNOSIS — J449 Chronic obstructive pulmonary disease, unspecified: Secondary | ICD-10-CM | POA: Diagnosis not present

## 2018-10-30 DIAGNOSIS — T24212A Burn of second degree of left thigh, initial encounter: Secondary | ICD-10-CM | POA: Insufficient documentation

## 2018-10-31 MED FILL — SANTYL OINTMENT: 250 | 14 days supply | Qty: 30 | Fill #0

## 2018-10-31 MED FILL — MELOXICAM 15 MG TABLET: 15 | 21 days supply | Qty: 21 | Fill #0

## 2018-11-05 ENCOUNTER — Encounter (INDEPENDENT_AMBULATORY_CARE_PROVIDER_SITE_OTHER): Payer: Self-pay | Admitting: Primary Care

## 2018-11-05 NOTE — Telephone Encounter (Signed)
FWD to PCP. Kailash Hinze S Louellen Haldeman, CMA  
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
## 2018-11-06 DIAGNOSIS — T24212A Burn of second degree of left thigh, initial encounter: Secondary | ICD-10-CM | POA: Diagnosis not present

## 2018-11-13 ENCOUNTER — Other Ambulatory Visit: Payer: Self-pay

## 2018-11-13 ENCOUNTER — Encounter (HOSPITAL_BASED_OUTPATIENT_CLINIC_OR_DEPARTMENT_OTHER): Payer: Medicaid Other | Attending: Internal Medicine

## 2018-11-13 DIAGNOSIS — I10 Essential (primary) hypertension: Secondary | ICD-10-CM | POA: Diagnosis not present

## 2018-11-13 DIAGNOSIS — T24222A Burn of second degree of left knee, initial encounter: Secondary | ICD-10-CM | POA: Insufficient documentation

## 2018-11-13 DIAGNOSIS — Y93G3 Activity, cooking and baking: Secondary | ICD-10-CM | POA: Diagnosis not present

## 2018-11-13 DIAGNOSIS — J449 Chronic obstructive pulmonary disease, unspecified: Secondary | ICD-10-CM | POA: Diagnosis not present

## 2018-11-13 DIAGNOSIS — T24212A Burn of second degree of left thigh, initial encounter: Secondary | ICD-10-CM | POA: Diagnosis present

## 2018-11-13 DIAGNOSIS — X19XXXA Contact with other heat and hot substances, initial encounter: Secondary | ICD-10-CM | POA: Diagnosis not present

## 2018-11-20 DIAGNOSIS — T24222A Burn of second degree of left knee, initial encounter: Secondary | ICD-10-CM | POA: Diagnosis not present

## 2018-11-27 DIAGNOSIS — T24222A Burn of second degree of left knee, initial encounter: Secondary | ICD-10-CM | POA: Diagnosis not present

## 2018-12-20 MED FILL — HYDROCHLOROTHIAZIDE 25 MG T: 25 | 30 days supply | Qty: 30 | Fill #1

## 2019-04-04 ENCOUNTER — Ambulatory Visit (INDEPENDENT_AMBULATORY_CARE_PROVIDER_SITE_OTHER): Payer: Medicaid Other | Admitting: Primary Care

## 2019-04-04 ENCOUNTER — Encounter (INDEPENDENT_AMBULATORY_CARE_PROVIDER_SITE_OTHER): Payer: Self-pay | Admitting: Primary Care

## 2019-04-04 ENCOUNTER — Other Ambulatory Visit: Payer: Self-pay

## 2019-04-04 ENCOUNTER — Other Ambulatory Visit (HOSPITAL_COMMUNITY)
Admission: RE | Admit: 2019-04-04 | Discharge: 2019-04-04 | Disposition: A | Discharge status: Home / Self Care | Payer: Medicaid Other | Source: Ambulatory Visit | Attending: Primary Care | Admitting: Primary Care

## 2019-04-04 VITALS — BP 152/86 | HR 86 | Temp 97.2°F | Ht 62.5 in | Wt 175.0 lb

## 2019-04-04 DIAGNOSIS — J3089 Other allergic rhinitis: Secondary | ICD-10-CM

## 2019-04-04 DIAGNOSIS — N912 Amenorrhea, unspecified: Secondary | ICD-10-CM | POA: Diagnosis not present

## 2019-04-04 DIAGNOSIS — N898 Other specified noninflammatory disorders of vagina: Secondary | ICD-10-CM | POA: Diagnosis not present

## 2019-04-04 DIAGNOSIS — E669 Obesity, unspecified: Secondary | ICD-10-CM

## 2019-04-04 DIAGNOSIS — K219 Gastro-esophageal reflux disease without esophagitis: Secondary | ICD-10-CM

## 2019-04-04 DIAGNOSIS — I10 Essential (primary) hypertension: Secondary | ICD-10-CM | POA: Diagnosis not present

## 2019-04-04 DIAGNOSIS — Z23 Encounter for immunization: Secondary | ICD-10-CM

## 2019-04-04 DIAGNOSIS — Z6831 Body mass index (BMI) 31.0-31.9, adult: Secondary | ICD-10-CM

## 2019-04-04 DIAGNOSIS — M5441 Lumbago with sciatica, right side: Secondary | ICD-10-CM | POA: Diagnosis not present

## 2019-04-04 DIAGNOSIS — G8929 Other chronic pain: Secondary | ICD-10-CM

## 2019-04-04 DIAGNOSIS — Z720 Tobacco use: Secondary | ICD-10-CM

## 2019-04-04 DIAGNOSIS — Z13220 Encounter for screening for lipoid disorders: Secondary | ICD-10-CM

## 2019-04-04 MED ORDER — OMEPRAZOLE 20 MG PO CPDR: 20.0000 mg | DELAYED_RELEASE_CAPSULE | Freq: Every day | ORAL | 0 refills | Status: DC

## 2019-04-04 MED ORDER — AMLODIPINE BESYLATE 10 MG PO TABS: 10.0000 mg | ORAL_TABLET | Freq: Every day | ORAL | 1 refills | Status: DC

## 2019-04-04 MED ORDER — BLOOD PRESSURE MONITOR KIT: 1.0000 | PACK | Freq: Three times a day (TID) | 0 refills | Status: AC | PRN

## 2019-04-04 MED ORDER — IBUPROFEN 800 MG PO TABS: 600.0000 mg | ORAL_TABLET | Freq: Three times a day (TID) | ORAL | 1 refills | Status: DC | PRN

## 2019-04-04 MED ORDER — HYDROCHLOROTHIAZIDE 25 MG PO TABS: 25.0000 mg | ORAL_TABLET | Freq: Every day | ORAL | 1 refills | Status: DC

## 2019-04-04 MED FILL — HYDROCHLOROTHIAZIDE 25 MG T: 25 | 30 days supply | Qty: 30 | Fill #0

## 2019-04-04 MED FILL — IBUPROFEN 800 MG TABLET: 800 | 30 days supply | Qty: 30 | Fill #0

## 2019-04-04 MED FILL — OMEPRAZOLE 20 MG CAP: 20 | 30 days supply | Qty: 30 | Fill #0

## 2019-04-04 MED FILL — AMLODIPINE BESYLATE 10 MG T: 10 | 30 days supply | Qty: 30 | Fill #0

## 2019-04-04 NOTE — Patient Instructions (Signed)
MANAGEMENT OF CHRONIC CONSTIPATION  Drink fluids in the recommended amount everyday. Recommend amount is 8 cups of water daily. Do not replace water with Gatorade or Powerade as these should only be used when you are dehydrated.  Eat lots of high fiber foods-fruits, veggies, bran and whole grain instead of white bread Be active everyday. Inactivity makes constipation worse. Add psyllium daily (Metamucil) which comes in capsules now. Start very low dose and work up to recommended dose on bottle daily. Stay away from Milk of Magnesia or any magnesium containing laxative, unless you need it to clear things out rarely. It is an addictive laxative and your gut will become dependent on it. If that is not working, I would start Miralax, which you can buy in generic 17 gms daily. It's a powder and not an "addictive laxative". Take it every day and titrate the dose up or down to get the daily Bm. We will consider the use of other pharmacological treatments should the above recommendations prove to be unsuccessful.   

## 2019-04-04 NOTE — Progress Notes (Signed)
Established Patient Office Visit  Subjective:  Patient ID: Gina Shelton, female    DOB: Nov 03, 1975  Age: 44 y.o. MRN: [id number]  CC:  Chief Complaint  Patient presents with  . Abdominal Pain    with vaginal discharge     HPI Gina Shelton presents for  complains of pain while sitting or standing uncomfortable in pelvic area. She also atates when  she eats it burns and than she lays down for rest or sleep. No menstrual cycle since December not pregnant tubes tide. Blood pressure elevated out of medication for 2 weeks.   Past Medical History:  Diagnosis Date  . Anxiety   . Depression   . Depression   . GERD (gastroesophageal reflux disease)   . Gout    ble  . Migraine   . Migraine     Past Surgical History:  Procedure Laterality Date  . CESAREAN SECTION  infection at incission requiring return to OR x 2  . TUBAL LIGATION      Family History  Problem Relation Age of Onset  . Hypertension Mother   . Arthritis Sister   . Anesthesia problems Neg Hx   . Hypotension Neg Hx   . Malignant hyperthermia Neg Hx   . Pseudochol deficiency Neg Hx     Social History   Socioeconomic History  . Marital status: Single    Spouse name: Not on file  . Number of children: Not on file  . Years of education: Not on file  . Highest education level: Not on file  Occupational History  . Not on file  Tobacco Use  . Smoking status: Current Every Day Smoker    Packs/day: 1.00    Years: 0.00    Pack years: 0.00    Types: Cigarettes  . Smokeless tobacco: Never Used  Substance and Sexual Activity  . Alcohol use: Yes    Comment: Consumes several beers every other day per patient stated  . Drug use: No  . Sexual activity: Yes  Other Topics Concern  . Not on file  Social History Narrative          Social Determinants of Health   Financial Resource Strain:   . Difficulty of Paying Living Expenses: Not on file  Food Insecurity:   . Worried About Charity fundraiser in  the Last Year: Not on file  . Ran Out of Food in the Last Year: Not on file  Transportation Needs:   . Lack of Transportation (Medical): Not on file  . Lack of Transportation (Non-Medical): Not on file  Physical Activity:   . Days of Exercise per Week: Not on file  . Minutes of Exercise per Session: Not on file  Stress:   . Feeling of Stress : Not on file  Social Connections:   . Frequency of Communication with Friends and Family: Not on file  . Frequency of Social Gatherings with Friends and Family: Not on file  . Attends Religious Services: Not on file  . Active Member of Clubs or Organizations: Not on file  . Attends Archivist Meetings: Not on file  . Marital Status: Not on file  Intimate Partner Violence:   . Fear of Current or Ex-Partner: Not on file  . Emotionally Abused: Not on file  . Physically Abused: Not on file  . Sexually Abused: Not on file    Outpatient Medications Prior to Visit  Medication Sig Dispense Refill  . fluticasone (FLONASE) 50 MCG/ACT  nasal spray Place 2 sprays into both nostrils daily. 1 g 0  . amLODipine (NORVASC) 10 MG tablet Take 1 tablet (10 mg total) by mouth daily for 30 days. 30 tablet 3  . capsaicin (ZOSTRIX) 0.025 % cream Apply topically 2 (two) times daily. (Patient taking differently: Apply 1 application topically as needed (pain). ) 45 g 0  . hydrochlorothiazide (HYDRODIURIL) 25 MG tablet Take 1 tablet (25 mg total) by mouth daily. (Patient not taking: Reported on 04/04/2019) 90 tablet 3  . ibuprofen (ADVIL,MOTRIN) 200 MG tablet Take 800 mg by mouth every 6 (six) hours as needed for mild pain.     . meloxicam (MOBIC) 7.5 MG tablet Take 1 tablet (7.5 mg total) by mouth daily. 15 tablet 0  . omeprazole (PRILOSEC) 20 MG capsule Take 1 capsule (20 mg total) by mouth daily. 30 capsule 3  . silver sulfADIAZINE (SILVADENE) 1 % cream Apply 1 application topically daily. 50 g 0   No facility-administered medications prior to visit.    No  Known Allergies  ROS Review of Systems  Gastrointestinal: Positive for abdominal pain and nausea.  Genitourinary: Positive for pelvic pain, vaginal discharge and vaginal pain.  Musculoskeletal: Positive for back pain.       Sleeps with heating pad every night and takes tylenol   All other systems reviewed and are negative.     Objective:    Physical Exam  Constitutional: She is oriented to person, place, and time. She appears well-developed and well-nourished.  obese  HENT:  Head: Normocephalic.  Eyes: Pupils are equal, round, and reactive to light. EOM are normal.  Cardiovascular: Normal rate and regular rhythm.  Pulmonary/Chest: Effort normal and breath sounds normal.  Abdominal: Soft. Bowel sounds are normal. There is abdominal tenderness.  Musculoskeletal:        General: Normal range of motion.     Cervical back: Normal range of motion.  Neurological: She is oriented to person, place, and time. She has normal reflexes.  Skin: Skin is warm and dry.  Psychiatric: She has a normal mood and affect. Her behavior is normal. Judgment and thought content normal.    BP (!) 152/86 (BP Location: Right Arm, Patient Position: Sitting, Cuff Size: Normal)   Pulse 86   Temp (!) 97.2 F (36.2 C) (Temporal)   Ht 5' 2.5" (1.588 m)   Wt 175 lb (79.4 kg)   LMP 03/23/2019 (Exact Date)   SpO2 99%   BMI 31.50 kg/m  Wt Readings from Last 3 Encounters:  04/04/19 175 lb (79.4 kg)  10/07/18 176 lb 5.9 oz (80 kg)  07/25/18 172 lb (78 kg)     There are no preventive care reminders to display for this patient.  There are no preventive care reminders to display for this patient.  Lab Results  Component Value Date   TSH 1.460 04/04/2019   Lab Results  Component Value Date   WBC 7.8 04/04/2019   HGB 12.3 04/04/2019   HCT 37.4 04/04/2019   MCV 91 04/04/2019   PLT 281 04/04/2019   Lab Results  Component Value Date   NA 143 04/04/2019   K 4.3 04/04/2019   CO2 20 04/04/2019    GLUCOSE 88 04/04/2019   BUN 10 04/04/2019   CREATININE 0.66 04/04/2019   BILITOT <0.2 04/04/2019   ALKPHOS 140 (H) 04/04/2019   AST 16 04/04/2019   ALT 11 04/04/2019   PROT 7.0 04/04/2019   ALBUMIN 4.6 04/04/2019   CALCIUM 10.0 04/04/2019  ANIONGAP 10 04/03/2018   Lab Results  Component Value Date   CHOL 164 04/04/2019   Lab Results  Component Value Date   HDL 76 04/04/2019   Lab Results  Component Value Date   LDLCALC 66 04/04/2019   Lab Results  Component Value Date   TRIG 129 04/04/2019   Lab Results  Component Value Date   CHOLHDL 2.2 04/04/2019   Lab Results  Component Value Date   HGBA1C 5.2 10/25/2017      Assessment & Plan:  Gina Shelton was seen today for abdominal pain.  Diagnoses and all orders for this visit:  Tobacco abuse She is made aware of  increased risk for lung cancer and other respiratory diseases recommend cessation.  This will be reminded at each clinical visit.  Essential hypertension Counseled on blood pressure goal of less than 130/80, low-sodium, DASH diet, medication compliance, 150 minutes of moderate intensity exercise per week.  Lipid screening -     Lipid Panel  Gastroesophageal reflux disease without esophagitis Discussed eating small frequent meal, reduction in acidic foods, fried foods ,spicy foods, alcohol caffeine and tobacco and certain medications. Avoid laying down after eating 53mns-1hour, elevated head of the bed.   Chronic right-sided low back pain with right-sided sciatica BACK PAIN  Location: right side low back pain Quality: throbbing Onset: gradual frequent  Worse with: bending/lifting     Better with: rest Radiation: to thigh  Trauma: no  Best sitting/standing/leaning forward: no  Red Flags Fecal/urinary incontinence: no Numbness/Weakness: no  Fever/chills/sweats:no Night pain: no Unexplained weight loss: no  No relief with bedrest: no h/o cancer/immunosuppression:no  Environmental and seasonal  allergies  Vaginal discharge -     Cervicovaginal ancillary only  Obesity (BMI 30.0-34.9) Obesity is 30-39 indicating an excess in caloric intake or underlining conditions. This may lead to other co-morbidities such as respiratory disease, CVD, diabetes. Lifestyle modifications of diet and exercise may reduce obesity.  -     Lipid Panel  Amenorrhea She has not had a  menstrual cycle for at least 3 cycles and  Previously she had a  normal menstruation ..Marland KitchenUnderlying causes- pregnancy, dysfunction of the hypothamus, pituitary, uterus, ovaries or vagina. Will order blood work -     CMP14+EGFR; Future -     CBC with Differential -     TSH+Prl+FSH+TestT+LH+DHEA S... -     CMP14+EGFR  Need for immunization against influenza CDC recommends influenza vaccine yearly.  Flu intercanthus respiratory illness caused by influenza virus that affects the nose throat and sometimes the lungs.  Experts believe that liver spread managed by tract was made mainly by tiny droplets made when people with the flu cough sneeze or talk. -     Flu Vaccine QUAD 36+ mos IM  Other orders -     amLODipine (NORVASC) 10 MG tablet; Take 1 tablet (10 mg total) by mouth daily. -     omeprazole (PRILOSEC) 20 MG capsule; Take 1 capsule (20 mg total) by mouth daily. -     hydrochlorothiazide (HYDRODIURIL) 25 MG tablet; Take 1 tablet (25 mg total) by mouth daily. -     ibuprofen (ADVIL) 800 MG tablet; Take 1 tablet (800 mg total) by mouth every 8 (eight) hours as needed. -     Blood Pressure Monitor KIT; 1 kit by Does not apply route 3 (three) times daily as needed.   Meds ordered this encounter  Medications  . amLODipine (NORVASC) 10 MG tablet    Sig: Take 1  tablet (10 mg total) by mouth daily.    Dispense:  90 tablet    Refill:  1  . omeprazole (PRILOSEC) 20 MG capsule    Sig: Take 1 capsule (20 mg total) by mouth daily.    Dispense:  90 capsule    Refill:  0  . hydrochlorothiazide (HYDRODIURIL) 25 MG tablet    Sig:  Take 1 tablet (25 mg total) by mouth daily.    Dispense:  90 tablet    Refill:  1  . ibuprofen (ADVIL) 800 MG tablet    Sig: Take 1 tablet (800 mg total) by mouth every 8 (eight) hours as needed.    Dispense:  90 tablet    Refill:  1  . Blood Pressure Monitor KIT    Sig: 1 kit by Does not apply route 3 (three) times daily as needed.    Dispense:  1 kit    Refill:  0    Follow-up: Return in about 3 weeks (around 04/25/2019) for Bp folow up tele call after recieves monitor.    [name], NP

## 2019-04-10 LAB — CERVICOVAGINAL ANCILLARY ONLY
Bacterial Vaginitis (gardnerella): POSITIVE — AB
Candida Glabrata: NEGATIVE
Candida Vaginitis: NEGATIVE
Chlamydia: NEGATIVE
Comment: NEGATIVE
Comment: NEGATIVE
Comment: NEGATIVE
Comment: NEGATIVE
Comment: NEGATIVE
Comment: NORMAL
Neisseria Gonorrhea: NEGATIVE
Trichomonas: POSITIVE — AB

## 2019-04-13 LAB — CMP14+EGFR
ALT: 11 IU/L (ref 0–32)
AST: 16 IU/L (ref 0–40)
Albumin/Globulin Ratio: 1.9 (ref 1.2–2.2)
Albumin: 4.6 g/dL (ref 3.8–4.8)
Alkaline Phosphatase: 140 IU/L — ABNORMAL HIGH (ref 39–117)
BUN/Creatinine Ratio: 15 (ref 9–23)
BUN: 10 mg/dL (ref 6–24)
Bilirubin Total: <0.2 mg/dL (ref 0.0–1.2)
CO2: 20 mmol/L (ref 20–29)
Calcium: 10 mg/dL (ref 8.7–10.2)
Chloride: 107 mmol/L — ABNORMAL HIGH (ref 96–106)
Creatinine, Ser: 0.66 mg/dL (ref 0.57–1.00)
GFR calc Af Amer: 127 mL/min/{1.73_m2} (ref >59)
GFR calc non Af Amer: 110 mL/min/{1.73_m2} (ref >59)
Globulin, Total: 2.4 g/dL (ref 1.5–4.5)
Glucose: 88 mg/dL (ref 65–99)
Potassium: 4.3 mmol/L (ref 3.5–5.2)
Sodium: 143 mmol/L (ref 134–144)
Total Protein: 7 g/dL (ref 6.0–8.5)

## 2019-04-13 LAB — CBC WITH DIFFERENTIAL/PLATELET
Basophils Absolute: 0 10*3/uL (ref 0.0–0.2)
Basos: 0 %
EOS (ABSOLUTE): 0.1 10*3/uL (ref 0.0–0.4)
Eos: 1 %
Hematocrit: 37.4 % (ref 34.0–46.6)
Hemoglobin: 12.3 g/dL (ref 11.1–15.9)
Immature Grans (Abs): 0 10*3/uL (ref 0.0–0.1)
Immature Granulocytes: 0 %
Lymphocytes Absolute: 2 10*3/uL (ref 0.7–3.1)
Lymphs: 26 %
MCH: 29.9 pg (ref 26.6–33.0)
MCHC: 32.9 g/dL (ref 31.5–35.7)
MCV: 91 fL (ref 79–97)
Monocytes Absolute: 0.9 10*3/uL (ref 0.1–0.9)
Monocytes: 11 %
Neutrophils Absolute: 4.8 10*3/uL (ref 1.4–7.0)
Neutrophils: 62 %
Platelets: 281 10*3/uL (ref 150–450)
RBC: 4.11 x10E6/uL (ref 3.77–5.28)
RDW: 14.4 % (ref 11.7–15.4)
WBC: 7.8 10*3/uL (ref 3.4–10.8)

## 2019-04-13 LAB — TSH+PRL+FSH+TESTT+LH+DHEA S...
17-Hydroxyprogesterone: 138 ng/dL
Androstenedione: 207 ng/dL (ref 41–262)
DHEA-SO4: 343 ug/dL — ABNORMAL HIGH (ref 57.3–279.2)
FSH: 3.4 m[IU]/mL
LH: 7.8 m[IU]/mL
Prolactin: 12.9 ng/mL (ref 4.8–23.3)
TSH: 1.46 u[IU]/mL (ref 0.450–4.500)
Testosterone, Free: 2.1 pg/mL (ref 0.0–4.2)
Testosterone: 32 ng/dL (ref 8–48)

## 2019-04-13 LAB — LIPID PANEL
Chol/HDL Ratio: 2.2 ratio (ref 0.0–4.4)
Cholesterol, Total: 164 mg/dL (ref 100–199)
HDL: 76 mg/dL (ref >39)
LDL Chol Calc (NIH): 66 mg/dL (ref 0–99)
Triglycerides: 129 mg/dL (ref 0–149)
VLDL Cholesterol Cal: 22 mg/dL (ref 5–40)

## 2019-04-19 ENCOUNTER — Ambulatory Visit (INDEPENDENT_AMBULATORY_CARE_PROVIDER_SITE_OTHER): Payer: Medicaid Other

## 2019-04-19 ENCOUNTER — Telehealth (INDEPENDENT_AMBULATORY_CARE_PROVIDER_SITE_OTHER): Payer: Self-pay | Admitting: Primary Care

## 2019-04-19 NOTE — Telephone Encounter (Signed)
Spoke with patient informed needed treatment labs were visible on my chart.

## 2019-04-22 ENCOUNTER — Ambulatory Visit (INDEPENDENT_AMBULATORY_CARE_PROVIDER_SITE_OTHER): Payer: Medicaid Other | Admitting: Primary Care

## 2019-04-22 ENCOUNTER — Encounter (INDEPENDENT_AMBULATORY_CARE_PROVIDER_SITE_OTHER): Payer: Self-pay

## 2019-04-22 ENCOUNTER — Other Ambulatory Visit: Payer: Self-pay

## 2019-04-22 VITALS — BP 136/97 | HR 103 | Temp 97.5°F | Wt 175.4 lb

## 2019-04-22 DIAGNOSIS — A599 Trichomoniasis, unspecified: Secondary | ICD-10-CM

## 2019-04-22 DIAGNOSIS — Z720 Tobacco use: Secondary | ICD-10-CM

## 2019-04-22 DIAGNOSIS — I10 Essential (primary) hypertension: Secondary | ICD-10-CM

## 2019-04-22 MED ORDER — METRONIDAZOLE 500 MG PO TABS
2000.0000 mg | ORAL_TABLET | Freq: Once | ORAL | Status: AC
  Administered 2019-04-22: 11:00:00 2000 mg via ORAL

## 2019-04-22 NOTE — Progress Notes (Signed)
175.4  

## 2019-04-22 NOTE — Progress Notes (Signed)
Established Patient Office Visit  Subjective:  Patient ID: Gina Shelton, female    DOB: 08/22/1975  Age: 44 y.o. MRN: [id number]  CC:  Chief Complaint  Patient presents with  . std treatment    HPI [name] presents for treatment of tricamosis results were on my chart but patient unable to assess them. Appointment schedule for Bp follow up  Past Medical History:  Diagnosis Date  . Anxiety   . Depression   . Depression   . GERD (gastroesophageal reflux disease)   . Gout    ble  . Migraine   . Migraine     Past Surgical History:  Procedure Laterality Date  . CESAREAN SECTION  infection at incission requiring return to OR x 2  . TUBAL LIGATION      Family History  Problem Relation Age of Onset  . Hypertension Mother   . Arthritis Sister   . Anesthesia problems Neg Hx   . Hypotension Neg Hx   . Malignant hyperthermia Neg Hx   . Pseudochol deficiency Neg Hx     Social History   Socioeconomic History  . Marital status: Single    Spouse name: Not on file  . Number of children: Not on file  . Years of education: Not on file  . Highest education level: Not on file  Occupational History  . Not on file  Tobacco Use  . Smoking status: Current Every Day Smoker    Packs/day: 1.00    Years: 0.00    Pack years: 0.00    Types: Cigarettes  . Smokeless tobacco: Never Used  Substance and Sexual Activity  . Alcohol use: Yes    Comment: Consumes several beers every other day per patient stated  . Drug use: No  . Sexual activity: Yes  Other Topics Concern  . Not on file  Social History Narrative   ** Merged History Encounter **       Social Determinants of Health   Financial Resource Strain:   . Difficulty of Paying Living Expenses: Not on file  Food Insecurity:   . Worried About Charity fundraiser in the Last Year: Not on file  . Ran Out of Food in the Last Year: Not on file  Transportation Needs:   . Lack of Transportation (Medical): Not on file   . Lack of Transportation (Non-Medical): Not on file  Physical Activity:   . Days of Exercise per Week: Not on file  . Minutes of Exercise per Session: Not on file  Stress:   . Feeling of Stress : Not on file  Social Connections:   . Frequency of Communication with Friends and Family: Not on file  . Frequency of Social Gatherings with Friends and Family: Not on file  . Attends Religious Services: Not on file  . Active Member of Clubs or Organizations: Not on file  . Attends Archivist Meetings: Not on file  . Marital Status: Not on file  Intimate Partner Violence:   . Fear of Current or Ex-Partner: Not on file  . Emotionally Abused: Not on file  . Physically Abused: Not on file  . Sexually Abused: Not on file    Outpatient Medications Prior to Visit  Medication Sig Dispense Refill  . amLODipine (NORVASC) 10 MG tablet Take 1 tablet (10 mg total) by mouth daily. 90 tablet 1  . Blood Pressure Monitor KIT 1 kit by Does not apply route 3 (three) times daily as needed.  1 kit 0  . fluticasone (FLONASE) 50 MCG/ACT nasal spray Place 2 sprays into both nostrils daily. 1 g 0  . hydrochlorothiazide (HYDRODIURIL) 25 MG tablet Take 1 tablet (25 mg total) by mouth daily. 90 tablet 1  . ibuprofen (ADVIL) 800 MG tablet Take 1 tablet (800 mg total) by mouth every 8 (eight) hours as needed. 90 tablet 1  . omeprazole (PRILOSEC) 20 MG capsule Take 1 capsule (20 mg total) by mouth daily. 90 capsule 0   No facility-administered medications prior to visit.    No Known Allergies  ROS Review of Systems  Genitourinary: Positive for vaginal discharge and vaginal pain.  All other systems reviewed and are negative.     Objective:    Physical Exam  Constitutional: She is oriented to person, place, and time. She appears well-developed and well-nourished.  Cardiovascular: Normal rate and regular rhythm.  Pulmonary/Chest: Effort normal and breath sounds normal.  Abdominal: Soft.   Musculoskeletal:        General: Normal range of motion.     Cervical back: Normal range of motion and neck supple.  Neurological: She is oriented to person, place, and time.  Skin: Skin is warm and dry.  Psychiatric: She has a normal mood and affect. Her behavior is normal. Judgment and thought content normal.    Wt 175 lb 6.4 oz (79.6 kg)   LMP 03/23/2019 (Exact Date)   BMI 31.57 kg/m  Wt Readings from Last 3 Encounters:  04/22/19 175 lb 6.4 oz (79.6 kg)  04/04/19 175 lb (79.4 kg)  10/07/18 176 lb 5.9 oz (80 kg)     There are no preventive care reminders to display for this patient.  There are no preventive care reminders to display for this patient.  Lab Results  Component Value Date   TSH 1.460 04/04/2019   Lab Results  Component Value Date   WBC 7.8 04/04/2019   HGB 12.3 04/04/2019   HCT 37.4 04/04/2019   MCV 91 04/04/2019   PLT 281 04/04/2019   Lab Results  Component Value Date   NA 143 04/04/2019   K 4.3 04/04/2019   CO2 20 04/04/2019   GLUCOSE 88 04/04/2019   BUN 10 04/04/2019   CREATININE 0.66 04/04/2019   BILITOT <0.2 04/04/2019   ALKPHOS 140 (H) 04/04/2019   AST 16 04/04/2019   ALT 11 04/04/2019   PROT 7.0 04/04/2019   ALBUMIN 4.6 04/04/2019   CALCIUM 10.0 04/04/2019   ANIONGAP 10 04/03/2018   Lab Results  Component Value Date   CHOL 164 04/04/2019   Lab Results  Component Value Date   HDL 76 04/04/2019   Lab Results  Component Value Date   LDLCALC 66 04/04/2019   Lab Results  Component Value Date   TRIG 129 04/04/2019   Lab Results  Component Value Date   CHOLHDL 2.2 04/04/2019   Lab Results  Component Value Date   HGBA1C 5.2 10/25/2017      Assessment & Plan:  Bailie was seen today for std treatment and blood pressure check.  Diagnoses and all orders for this visit:  Trichimoniasis Sexually transmitted disease both partners need treatment . In clinic treated with 2gm of metronidazole . This covers BV that she also  has. -     metroNIDAZOLE (FLAGYL) tablet 2,000 mg  Tobacco abuse Each visit we will discuss her increased risk for lung cancer and other respiratory diseases recommend cessation.  This will be reminded at each clinical visit.  Essential hypertension  She is able to check Bp at home systolic ranges from 459-977 and diastolic 41-42  States is compliance with taking her amlodipine  7m and HCTZ 252mdaily but Bp does fluctuate depending on what she has eaten. Gentile reminder blood pressure goal </= 130/80, low-sodium, DASH diet, medication compliance, 150 minutes of moderate intensity exercise per week.     Meds ordered this encounter  Medications  . metroNIDAZOLE (FLAGYL) tablet 2,000 mg    Follow-up: No follow-ups on file.    MiKerin PernaNP

## 2019-04-22 NOTE — Patient Instructions (Signed)

## 2019-05-03 ENCOUNTER — Encounter (HOSPITAL_COMMUNITY): Payer: Self-pay

## 2019-05-03 ENCOUNTER — Emergency Department (HOSPITAL_COMMUNITY): Payer: Medicaid Other

## 2019-05-03 ENCOUNTER — Emergency Department (HOSPITAL_COMMUNITY)
Admission: EM | Admit: 2019-05-03 | Discharge: 2019-05-03 | Disposition: A | Discharge status: Home / Self Care | Payer: Medicaid Other | Attending: Emergency Medicine | Admitting: Emergency Medicine

## 2019-05-03 ENCOUNTER — Other Ambulatory Visit: Payer: Self-pay

## 2019-05-03 DIAGNOSIS — I10 Essential (primary) hypertension: Secondary | ICD-10-CM | POA: Diagnosis not present

## 2019-05-03 DIAGNOSIS — S39012A Strain of muscle, fascia and tendon of lower back, initial encounter: Secondary | ICD-10-CM

## 2019-05-03 DIAGNOSIS — Y92511 Restaurant or cafe as the place of occurrence of the external cause: Secondary | ICD-10-CM | POA: Diagnosis not present

## 2019-05-03 DIAGNOSIS — Z79899 Other long term (current) drug therapy: Secondary | ICD-10-CM | POA: Diagnosis not present

## 2019-05-03 DIAGNOSIS — M5442 Lumbago with sciatica, left side: Secondary | ICD-10-CM | POA: Insufficient documentation

## 2019-05-03 DIAGNOSIS — F17210 Nicotine dependence, cigarettes, uncomplicated: Secondary | ICD-10-CM | POA: Diagnosis not present

## 2019-05-03 DIAGNOSIS — S3992XA Unspecified injury of lower back, initial encounter: Secondary | ICD-10-CM | POA: Diagnosis present

## 2019-05-03 DIAGNOSIS — X501XXA Overexertion from prolonged static or awkward postures, initial encounter: Secondary | ICD-10-CM | POA: Diagnosis not present

## 2019-05-03 DIAGNOSIS — Y999 Unspecified external cause status: Secondary | ICD-10-CM | POA: Diagnosis not present

## 2019-05-03 DIAGNOSIS — Y939 Activity, unspecified: Secondary | ICD-10-CM | POA: Insufficient documentation

## 2019-05-03 DIAGNOSIS — M5432 Sciatica, left side: Secondary | ICD-10-CM

## 2019-05-03 LAB — URINALYSIS, ROUTINE W REFLEX MICROSCOPIC
Bilirubin Urine: NEGATIVE
Glucose, UA: NEGATIVE mg/dL
Ketones, ur: NEGATIVE mg/dL
Leukocytes,Ua: NEGATIVE
Nitrite: NEGATIVE
Protein, ur: NEGATIVE mg/dL
Specific Gravity, Urine: 1.012 (ref 1.005–1.030)
pH: 6 (ref 5.0–8.0)

## 2019-05-03 LAB — POC URINE PREG, ED: Preg Test, Ur: NEGATIVE

## 2019-05-03 MED ORDER — PREDNISONE 50 MG PO TABS: 50.0000 mg | ORAL_TABLET | Freq: Every day | ORAL | 0 refills | Status: DC

## 2019-05-03 MED ORDER — OXYCODONE-ACETAMINOPHEN 5-325 MG PO TABS
1.0000 | ORAL_TABLET | Freq: Once | ORAL | Status: AC
  Administered 2019-05-03: 20:00:00 1 via ORAL
  Filled 2019-05-03: qty 1

## 2019-05-03 MED ORDER — CYCLOBENZAPRINE HCL 10 MG PO TABS: 10.0000 mg | ORAL_TABLET | Freq: Every day | ORAL | 0 refills | Status: DC

## 2019-05-03 MED ORDER — TRAMADOL HCL 50 MG PO TABS: 50.0000 mg | ORAL_TABLET | Freq: Four times a day (QID) | ORAL | 0 refills | Status: DC | PRN

## 2019-05-03 NOTE — ED Triage Notes (Signed)
Pt BIB EMS from home. Pt reports lower back pain that radiates to legs that started an hour ago. Pt reports hx of back pain.

## 2019-05-03 NOTE — ED Provider Notes (Signed)
[facility] DEPT Provider Note   CSN: [id number] Arrival date & time: 05/03/19  1759     History Chief Complaint  Patient presents with  . Back Pain    Gina Shelton is a 44 y.o. female.  HPI Patient presents to the emergency department with lower back pain that started while she was at work.  Patient states she works at a [employer] and does a lot of standing and bending and twisting and walking.  Patient states that the pain radiates into her left lower extremity.  Patient not take any medications prior to arrival for her symptoms.  The patient denies chest pain, shortness of breath, headache,blurred vision, neck pain, fever, cough, weakness, numbness, dizziness, anorexia, edema, abdominal pain, nausea, vomiting, diarrhea, rash, dysuria, hematemesis, bloody stool, near syncope, or syncope.    Past Medical History:  Diagnosis Date  . Anxiety   . Depression   . Depression   . GERD (gastroesophageal reflux disease)   . Gout    ble  . Migraine   . Migraine     Patient Active Problem List   Diagnosis Date Noted  . Chest pain 09/27/2017  . Polysubstance abuse ([place]) 09/27/2017  . Hypertensive urgency 09/26/2017  . Obesity (BMI 30.0-34.9) 05/22/2017  . Ovarian cyst 05/22/2017  . Cavernous hemangioma of liver 05/22/2017  . Major depressive disorder, single episode, severe without psychosis ([place]) 12/31/2015  . 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 Alcohol dependence ([place]) 06/07/2013  . Cocaine abuse ([place]) 06/07/2013  . DEPRESSION, MAJOR, RECURRENT 08/17/2007  . ANXIETY STATE NOS 11/15/2006    Past Surgical History:  Procedure Laterality Date  . CESAREAN SECTION  infection at incission requiring return to OR x 2  . TUBAL LIGATION       OB History    Gravida  3   Para  3   Term  0   Preterm  0   AB  0   Living        SAB  0   TAB  0   Ectopic  0   Multiple        Live Births              Family History  Problem Relation Age of Onset  . Hypertension Mother   . Arthritis Sister   . Anesthesia problems Neg Hx   . Hypotension Neg Hx   . Malignant hyperthermia Neg Hx   . Pseudochol deficiency Neg Hx     Social History   Tobacco Use  . Smoking status: Current Every Day Smoker    Packs/day: 1.00    Years: 0.00    Pack years: 0.00    Types: Cigarettes  . Smokeless tobacco: Never Used  Substance Use Topics  . Alcohol use: Yes    Comment: Consumes several beers every other day per patient stated  . Drug use: No    Home Medications Prior to Admission medications   Medication Sig Start Date End Date Taking? Authorizing Provider  amLODipine (NORVASC) 10 MG tablet Take 1 tablet (10 mg total) by mouth daily. 04/04/19 05/04/19  [name], NP  Blood Pressure Monitor KIT 1 kit by Does not apply route 3 (three) times daily as needed. 04/04/19   [name], NP  fluticasone (FLONASE) 50 MCG/ACT nasal spray Place 2 sprays into both nostrils daily. 09/08/18   Tasia Catchings, Amy V, PA-C  hydrochlorothiazide (HYDRODIURIL) 25 MG tablet  Take 1 tablet (25 mg total) by mouth daily. 04/04/19   [name], NP  ibuprofen (ADVIL) 800 MG tablet Take 1 tablet (800 mg total) by mouth every 8 (eight) hours as needed. 04/04/19   [name], NP  omeprazole (PRILOSEC) 20 MG capsule Take 1 capsule (20 mg total) by mouth daily. 04/04/19   [name], NP    Allergies    Patient has no known allergies.  Review of Systems   Review of Systems All other systems negative except as documented in the HPI. All pertinent positives and negatives as reviewed in the HPI. Physical Exam Updated Vital Signs BP (!) 129/93 (BP Location: Left Arm)   Pulse 95   Temp 98.3 F (36.8 C) (Oral)   Resp 16   LMP 04/26/2019 (Approximate)   SpO2 99%   Physical Exam Vitals and nursing note reviewed.  Constitutional:      General: She is not in acute  distress.    Appearance: She is well-developed.  HENT:     Head: Normocephalic and atraumatic.  Eyes:     Pupils: Pupils are equal, round, and reactive to light.  Cardiovascular:     Rate and Rhythm: Normal rate and regular rhythm.     Heart sounds: Normal heart sounds. No murmur. No friction rub. No gallop.   Pulmonary:     Effort: Pulmonary effort is normal. No respiratory distress.     Breath sounds: Normal breath sounds. No wheezing.  Musculoskeletal:     Cervical back: Normal range of motion and neck supple.     Lumbar back: Tenderness present. No deformity, signs of trauma or bony tenderness.       Back:  Skin:    General: Skin is warm and dry.     Capillary Refill: Capillary refill takes less than 2 seconds.     Findings: No erythema or rash.  Neurological:     Mental Status: She is alert and oriented to person, place, and time.     Motor: No abnormal muscle tone.     Coordination: Coordination normal.  Psychiatric:        Behavior: Behavior normal.     ED Results / Procedures / Treatments   Labs (all labs ordered are listed, but only abnormal results are displayed) Labs Reviewed  URINALYSIS, ROUTINE W REFLEX MICROSCOPIC - Abnormal; Notable for the following components:      Result Value   Hgb urine dipstick SMALL (*)    Bacteria, UA RARE (*)    All other components within normal limits  POC URINE PREG, ED    EKG None  Radiology DG Lumbar Spine Complete  Result Date: 05/03/2019 CLINICAL DATA:  44 year old female with low back pain. EXAM: LUMBAR SPINE - COMPLETE 4+ VIEW COMPARISON:  Lumbar spine radiograph dated 11/02/2009. FINDINGS: Five lumbar type vertebra. There is no acute fracture or subluxation of the lumbar spine. There is degenerative changes with sclerosis and spurring most prominent at L3. Bilateral sacroiliitis, progressed since the prior radiograph. There is mild atherosclerotic calcification of the abdominal aorta. The soft tissues are unremarkable.  IMPRESSION: 1. No acute fracture or subluxation 2. Degenerative changes and bone spurring primarily involving L3, advanced for age. 3. Bilateral sacroiliitis, progressed since the prior radiograph. 4.  Aortic Atherosclerosis (ICD10-I70.0). Electronically Signed   By: Anner Crete M.D.   On: 05/03/2019 20:36    Procedures Procedures (including critical care time)  Medications Ordered in ED Medications  oxyCODONE-acetaminophen (PERCOCET/ROXICET) 5-325 MG  per tablet 1 tablet (1 tablet Oral Given 05/03/19 1954)    ED Course  I have reviewed the triage vital signs and the nursing notes.  Pertinent labs & imaging results that were available during my care of the patient were reviewed by me and considered in my medical decision making (see chart for details).    MDM Rules/Calculators/A&P                      Patient will be treated for lumbar strain with sciatica.  I have advised the patient of the plan and all questions were answered.  I advised her that her job most likely contributes to this she does have some degenerative changes noted on her x-ray which also set her up for lower back issues.  I advised her to follow-up with her primary care doctor.  Patient does not have any neurological deficits noted on exam he can ambulate without significant difficulties. Final Clinical Impression(s) / ED Diagnoses Final diagnoses:  None    Rx / DC Orders ED Discharge Orders    None       Rebeca Allegra 05/03/19 2112    Hayden Rasmussen, MD 05/04/19 1032

## 2019-05-03 NOTE — Discharge Instructions (Addendum)
Return here as needed.  Follow-up with your primary care doctor for a recheck.  Use ice and heat on your lower back.

## 2019-05-06 MED FILL — HYDROCHLOROTHIAZIDE 25 MG T: 25 | 30 days supply | Qty: 30 | Fill #1

## 2019-05-06 MED FILL — OMEPRAZOLE 20 MG CAP: 20 | 30 days supply | Qty: 30 | Fill #1

## 2019-05-06 MED FILL — AMLODIPINE BESYLATE 10 MG T: 10 | 30 days supply | Qty: 30 | Fill #1

## 2019-05-06 MED FILL — IBUPROFEN 800 MG TABLET: 800 | 30 days supply | Qty: 90 | Fill #1

## 2019-07-04 ENCOUNTER — Other Ambulatory Visit: Payer: Self-pay

## 2019-07-04 ENCOUNTER — Ambulatory Visit (INDEPENDENT_AMBULATORY_CARE_PROVIDER_SITE_OTHER): Payer: Medicaid Other

## 2019-07-04 ENCOUNTER — Ambulatory Visit (HOSPITAL_COMMUNITY)
Admission: EM | Admit: 2019-07-04 | Discharge: 2019-07-04 | Disposition: A | Discharge status: Home / Self Care | Payer: Medicaid Other | Attending: Family Medicine | Admitting: Family Medicine

## 2019-07-04 ENCOUNTER — Encounter (HOSPITAL_COMMUNITY): Payer: Self-pay

## 2019-07-04 DIAGNOSIS — M25512 Pain in left shoulder: Secondary | ICD-10-CM | POA: Diagnosis not present

## 2019-07-04 MED ORDER — ACETAMINOPHEN 500 MG PO TABS: 500.0000 mg | ORAL_TABLET | Freq: Four times a day (QID) | ORAL | 0 refills | Status: DC | PRN

## 2019-07-04 MED ORDER — KETOROLAC TROMETHAMINE 30 MG/ML IJ SOLN
30.0000 mg | Freq: Once | INTRAMUSCULAR | Status: AC
  Administered 2019-07-04: 30 mg via INTRAMUSCULAR

## 2019-07-04 MED ORDER — KETOROLAC TROMETHAMINE 30 MG/ML IJ SOLN
INTRAMUSCULAR | Status: AC
  Filled 2019-07-04: qty 1

## 2019-07-04 MED ORDER — CYCLOBENZAPRINE HCL 10 MG PO TABS: 10.0000 mg | ORAL_TABLET | Freq: Two times a day (BID) | ORAL | 0 refills | Status: DC | PRN

## 2019-07-04 MED FILL — CYCLOBENZAPRINE 10 MG TAB: 10 | 10 days supply | Qty: 20 | Fill #0

## 2019-07-04 NOTE — Discharge Instructions (Addendum)
Take the ibuprofen as prescribed.  Rest and elevate your arm.  Apply ice packs 2-3 times a day for up to 20 minutes each.    Follow up with your primary care provider or an orthopedist if you symptoms continue or worsen;  Or if you develop new symptoms, such as numbness, tingling, or weakness.    X-ray was negative today.  Follow-up with orthopedics as needed.  He may take ibuprofen with the Tylenol.  You may also take the muscle relaxer twice daily as needed for muscle spasms.
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
## 2019-07-11 MED FILL — IBUPROFEN 800 MG TABLET: 800 | 20 days supply | Qty: 60 | Fill #2

## 2019-07-11 MED FILL — AMLODIPINE BESYLATE 10 MG T: 10 | 30 days supply | Qty: 30 | Fill #2

## 2019-07-11 MED FILL — HYDROCHLOROTHIAZIDE 25 MG T: 25 | 30 days supply | Qty: 30 | Fill #2

## 2019-08-30 ENCOUNTER — Other Ambulatory Visit (INDEPENDENT_AMBULATORY_CARE_PROVIDER_SITE_OTHER): Payer: Self-pay | Admitting: Primary Care

## 2019-08-30 MED FILL — AMLODIPINE BESYLATE 10 MG T: 10 | 30 days supply | Qty: 30 | Fill #3

## 2019-08-30 MED FILL — HYDROCHLOROTHIAZIDE 25 MG T: 25 | 30 days supply | Qty: 30 | Fill #3

## 2019-08-30 MED FILL — OMEPRAZOLE 20 MG CAP: 20 | 30 days supply | Qty: 30 | Fill #2

## 2019-09-10 ENCOUNTER — Other Ambulatory Visit (HOSPITAL_COMMUNITY)
Admission: RE | Admit: 2019-09-10 | Discharge: 2019-09-10 | Disposition: A | Discharge status: Home / Self Care | Payer: Medicaid Other | Source: Ambulatory Visit | Attending: Primary Care | Admitting: Primary Care

## 2019-09-10 ENCOUNTER — Other Ambulatory Visit (INDEPENDENT_AMBULATORY_CARE_PROVIDER_SITE_OTHER): Payer: Self-pay | Admitting: Primary Care

## 2019-09-10 ENCOUNTER — Ambulatory Visit (INDEPENDENT_AMBULATORY_CARE_PROVIDER_SITE_OTHER): Payer: Medicaid Other | Admitting: Primary Care

## 2019-09-10 ENCOUNTER — Encounter (INDEPENDENT_AMBULATORY_CARE_PROVIDER_SITE_OTHER): Payer: Self-pay | Admitting: Primary Care

## 2019-09-10 ENCOUNTER — Other Ambulatory Visit: Payer: Self-pay

## 2019-09-10 VITALS — BP 133/81 | HR 113 | Temp 97.3°F | Resp 16 | Ht 64.5 in | Wt 171.0 lb

## 2019-09-10 DIAGNOSIS — J441 Chronic obstructive pulmonary disease with (acute) exacerbation: Secondary | ICD-10-CM | POA: Diagnosis not present

## 2019-09-10 DIAGNOSIS — N898 Other specified noninflammatory disorders of vagina: Secondary | ICD-10-CM | POA: Insufficient documentation

## 2019-09-10 DIAGNOSIS — M545 Low back pain, unspecified: Secondary | ICD-10-CM

## 2019-09-10 DIAGNOSIS — F332 Major depressive disorder, recurrent severe without psychotic features: Secondary | ICD-10-CM

## 2019-09-10 DIAGNOSIS — R350 Frequency of micturition: Secondary | ICD-10-CM | POA: Diagnosis not present

## 2019-09-10 DIAGNOSIS — F10220 Alcohol dependence with intoxication, uncomplicated: Secondary | ICD-10-CM

## 2019-09-10 LAB — POCT URINALYSIS DIP (CLINITEK)
Bilirubin, UA: NEGATIVE
Glucose, UA: NEGATIVE mg/dL
Ketones, POC UA: NEGATIVE mg/dL
Leukocytes, UA: NEGATIVE
Nitrite, UA: NEGATIVE
Spec Grav, UA: 1.025 (ref 1.010–1.025)
Urobilinogen, UA: 0.2 E.U./dL
pH, UA: 6 (ref 5.0–8.0)

## 2019-09-10 MED ORDER — AMLODIPINE BESYLATE 10 MG PO TABS: 10.0000 mg | ORAL_TABLET | Freq: Every day | ORAL | 1 refills | Status: DC

## 2019-09-10 MED ORDER — HYDROCHLOROTHIAZIDE 25 MG PO TABS: 25.0000 mg | ORAL_TABLET | Freq: Every day | ORAL | 1 refills | Status: DC

## 2019-09-10 MED ORDER — IBUPROFEN 800 MG PO TABS: 800.0000 mg | ORAL_TABLET | Freq: Three times a day (TID) | ORAL | 1 refills | Status: DC | PRN

## 2019-09-10 MED FILL — IBUPROFEN 800 MG TABLET: 800 | 30 days supply | Qty: 90 | Fill #0

## 2019-09-10 NOTE — Patient Instructions (Signed)

## 2019-09-10 NOTE — Progress Notes (Signed)
Acute Office Visit  Subjective:    Patient ID: Gina Shelton, female    DOB: 07-May-1975, 44 y.o.   MRN: [id number]  No chief complaint on file.   HPI Ms. Gina Shelton is a 44 year old female in today for an acute visit. She voices 2 days ago her feet were so swollen she could not get out of the bed to walk. Back pain 8/10  Ibuprofen 800 mg was working daily now she is having to take it 3 times a day for relief. 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 She also has to transfer her by her self to the wheel chair twice a day   Past Medical History:  Diagnosis Date  . Anxiety   . Depression   . Depression   . GERD (gastroesophageal reflux disease)   . Gout    ble  . Migraine   . Migraine     Past Surgical History:  Procedure Laterality Date  . CESAREAN SECTION  infection at incission requiring return to OR x 2  . TUBAL LIGATION      Family History  Problem Relation Age of Onset  . Hypertension Mother   . Arthritis Sister   . Anesthesia problems Neg Hx   . Hypotension Neg Hx   . Malignant hyperthermia Neg Hx   . Pseudochol deficiency Neg Hx     Social History   Socioeconomic History  . Marital status: Single    Spouse name: Not on file  . Number of children: Not on file  . Years of education: Not on file  . Highest education level: Not on file  Occupational History  . Not on file  Tobacco Use  . Smoking status: Current Every Day Smoker    Packs/day: 1.00    Years: 0.00    Pack years: 0.00    Types: Cigarettes  . Smokeless tobacco: Never Used  Vaping Use  . Vaping Use: Never used  Substance and Sexual Activity  . Alcohol use: Yes    Comment: Consumes several beers every other day per patient stated  . Drug use: No  . Sexual activity: Yes  Other Topics Concern  . Not on file  Social History Narrative   ** Merged History Encounter **       Social Determinants of Health   Financial Resource Strain:   .  Difficulty of Paying Living Expenses:   Food Insecurity:   . Worried About Charity fundraiser in the Last Year:   . Arboriculturist in the Last Year:   Transportation Needs:   . Film/video editor (Medical):   Marland Kitchen Lack of Transportation (Non-Medical):   Physical Activity:   . Days of Exercise per Week:   . Minutes of Exercise per Session:   Stress:   . Feeling of Stress :   Social Connections:   . Frequency of Communication with Friends and Family:   . Frequency of Social Gatherings with Friends and Family:   . Attends Religious Services:   . Active Member of Clubs or Organizations:   . Attends Archivist Meetings:   Marland Kitchen Marital Status:   Intimate Partner Violence:   . Fear of Current or Ex-Partner:   . Emotionally Abused:   Marland Kitchen Physically Abused:   . Sexually Abused:     Outpatient Medications Prior to Visit  Medication Sig Dispense Refill  . acetaminophen (TYLENOL) 500  MG tablet Take 1 tablet (500 mg total) by mouth every 6 (six) hours as needed. 30 tablet 0  . omeprazole (PRILOSEC) 20 MG capsule Take 1 capsule (20 mg total) by mouth daily. 90 capsule 0  . amLODipine (NORVASC) 10 MG tablet Take 1 tablet (10 mg total) by mouth daily. 90 tablet 1  . hydrochlorothiazide (HYDRODIURIL) 25 MG tablet Take 1 tablet (25 mg total) by mouth daily. 90 tablet 1  . ibuprofen (ADVIL) 800 MG tablet Take 1 tablet (800 mg total) by mouth every 8 (eight) hours as needed. 90 tablet 1  . Blood Pressure Monitor KIT 1 kit by Does not apply route 3 (three) times daily as needed. (Patient not taking: Reported on 09/10/2019) 1 kit 0  . cyclobenzaprine (FLEXERIL) 10 MG tablet Take 1 tablet (10 mg total) by mouth 2 (two) times daily as needed for muscle spasms. (Patient not taking: Reported on 09/10/2019) 20 tablet 0  . fluticasone (FLONASE) 50 MCG/ACT nasal spray Place 2 sprays into both nostrils daily. (Patient not taking: Reported on 09/10/2019) 1 g 0  . predniSONE (DELTASONE) 50 MG tablet Take 1  tablet (50 mg total) by mouth daily. (Patient not taking: Reported on 09/10/2019) 5 tablet 0  . traMADol (ULTRAM) 50 MG tablet Take 1 tablet (50 mg total) by mouth every 6 (six) hours as needed for severe pain. (Patient not taking: Reported on 09/10/2019) 15 tablet 0   No facility-administered medications prior to visit.    No Known Allergies  Review of Systems  Cardiovascular: Positive for leg swelling.  Genitourinary: Positive for vaginal discharge.  Musculoskeletal: Positive for back pain.  All other systems reviewed and are negative.      Objective:    Physical Exam Vitals reviewed.  Constitutional:      Appearance: Normal appearance. She is obese.  Neurological:     Mental Status: She is alert.     BP 133/81 (BP Location: Right Arm, Patient Position: Sitting, Cuff Size: Normal)   Pulse (!) 113   Temp (!) 97.3 F (36.3 C)   Resp 16   Ht 5' 4.5" (1.638 m)   Wt 171 lb (77.6 kg)   LMP 09/06/2019 (Approximate)   SpO2 98%   BMI 28.90 kg/m  Wt Readings from Last 3 Encounters:  09/10/19 171 lb (77.6 kg)  04/22/19 175 lb 6.4 oz (79.6 kg)  04/04/19 175 lb (79.4 kg)    Health Maintenance Due  Topic Date Due  . COVID-19 Vaccine (1) Never done    There are no preventive care reminders to display for this patient.   Lab Results  Component Value Date   TSH 1.460 04/04/2019   Lab Results  Component Value Date   WBC 7.8 04/04/2019   HGB 12.3 04/04/2019   HCT 37.4 04/04/2019   MCV 91 04/04/2019   PLT 281 04/04/2019   Lab Results  Component Value Date   NA 143 04/04/2019   K 4.3 04/04/2019   CO2 20 04/04/2019   GLUCOSE 88 04/04/2019   BUN 10 04/04/2019   CREATININE 0.66 04/04/2019   BILITOT <0.2 04/04/2019   ALKPHOS 140 (H) 04/04/2019   AST 16 04/04/2019   ALT 11 04/04/2019   PROT 7.0 04/04/2019   ALBUMIN 4.6 04/04/2019   CALCIUM 10.0 04/04/2019   ANIONGAP 10 04/03/2018   Lab Results  Component Value Date   CHOL 164 04/04/2019   Lab Results   Component Value Date   HDL 76 04/04/2019   Lab Results  Component Value Date   LDLCALC 66 04/04/2019   Lab Results  Component Value Date   TRIG 129 04/04/2019   Lab Results  Component Value Date   CHOLHDL 2.2 04/04/2019   Lab Results  Component Value Date   HGBA1C 5.2 10/25/2017       Assessment & Plan:  Diagnoses and all orders for this visit:  Vaginal discharge Described as green slimy color and texture -     Cervical anxcillary  Frequent urination POCT URINALYSIS DIP (CLINITEK)  Low back pain, unspecified back pain laterality, unspecified chronicity, unspecified whether sciatica present 1. Back Pain Patient reports that pain began 4 weeks . yes h/o back pain.  Pain is a 8/10.  It radiate.down legs to feet .  Worsens pain with movement . Laying down improves pain.  Patient has been taking ibuprofen  for pain with little relief.  Patient denies trauma or injury. No dysuria, hematuria, fevers, chills, nausea, vomiting, abdominal pain, renal stones.   Denies urinary retention/incontinence, bowel incontinence, weakness, falls, sensation changes or pain anywhere else. No h/o back surgeries.   Meds ordered this encounter  Medications  . amLODipine (NORVASC) 10 MG tablet    Sig: Take 1 tablet (10 mg total) by mouth daily.    Dispense:  90 tablet    Refill:  1  . hydrochlorothiazide (HYDRODIURIL) 25 MG tablet    Sig: Take 1 tablet (25 mg total) by mouth daily.    Dispense:  90 tablet    Refill:  1  . ibuprofen (ADVIL) 800 MG tablet    Sig: Take 1 tablet (800 mg total) by mouth every 8 (eight) hours as needed.    Dispense:  90 tablet    Refill:  1     [name], NP

## 2019-09-10 NOTE — Progress Notes (Signed)
Vaginal discharge and abdominal pain x 2 weeks Urinary frequency ____________________________  Body aches, muscle aches.  BLE swelling Taking ibuprofen 800 mg

## 2019-09-11 LAB — CERVICOVAGINAL ANCILLARY ONLY
Bacterial Vaginitis (gardnerella): POSITIVE — AB
Candida Glabrata: NEGATIVE
Candida Vaginitis: NEGATIVE
Chlamydia: NEGATIVE
Comment: NEGATIVE
Comment: NEGATIVE
Comment: NEGATIVE
Comment: NEGATIVE
Comment: NEGATIVE
Comment: NORMAL
Neisseria Gonorrhea: NEGATIVE
Trichomonas: NEGATIVE

## 2019-09-17 ENCOUNTER — Encounter: Payer: Self-pay | Admitting: Family Medicine

## 2019-09-17 ENCOUNTER — Other Ambulatory Visit: Payer: Self-pay

## 2019-09-17 ENCOUNTER — Ambulatory Visit: Payer: Medicaid Other | Admitting: Family Medicine

## 2019-09-17 ENCOUNTER — Ambulatory Visit (INDEPENDENT_AMBULATORY_CARE_PROVIDER_SITE_OTHER): Payer: Medicaid Other

## 2019-09-17 DIAGNOSIS — M461 Sacroiliitis, not elsewhere classified: Secondary | ICD-10-CM

## 2019-09-17 DIAGNOSIS — M5441 Lumbago with sciatica, right side: Secondary | ICD-10-CM

## 2019-09-17 DIAGNOSIS — M5442 Lumbago with sciatica, left side: Secondary | ICD-10-CM

## 2019-09-17 IMAGING — MR MR PELVIS WO/W CM
16 of 18 series · 42 of 48 positions shown · IV contrast (Multihance 18ml)
Comparison: Pelvic ultrasound 05/22/2017; CT abdomen pelvis
05/21/2017

CLINICAL DATA: Patient with possible pelvic mass on prior CT.

EXAM:
MRI PELVIS WITHOUT AND WITH CONTRAST
TECHNIQUE: Multiplanar multisequence MR imaging of the pelvis was performed
both before and after administration of intravenous contrast.
CONTRAST:  18mL MULTIHANCE GADOBENATE DIMEGLUMINE 529 MG/ML IV SOLN

[Series 3: STIR · coronal · right · 3.0mm · 1.25mm/px · 3 of 62 slices shown]
[im 1/62]
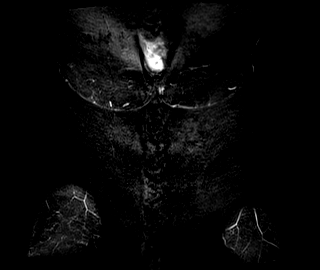
[im 31/62]
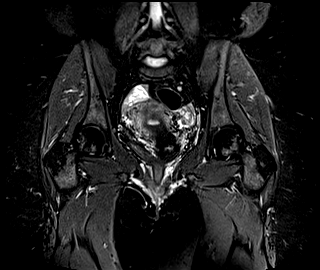
[im 62/62]
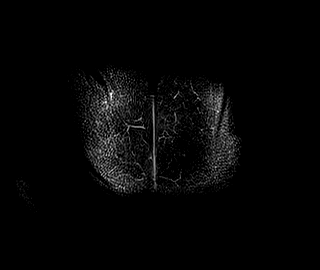

[Series 5: T2 · sagittal · right · 5.0mm · 0.78mm/px · 1 of 25 slices shown (1 of 3)]
[im 1/25]
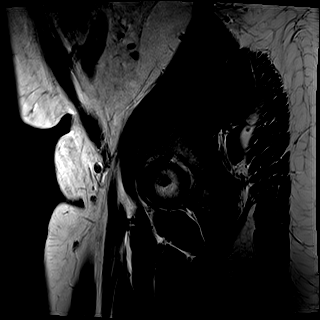

[Series 6: T2 · coronal · right · 5.0mm · 1.56mm/px · 1 of 39 slices shown (2 of 3)]
[im 1/39]
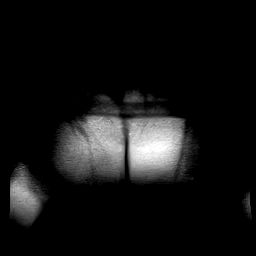

[Series 7: T2 · axial · right · 5.0mm · 0.41mm/px · 1 of 33 slices shown (3 of 3)]
[im 1/33]
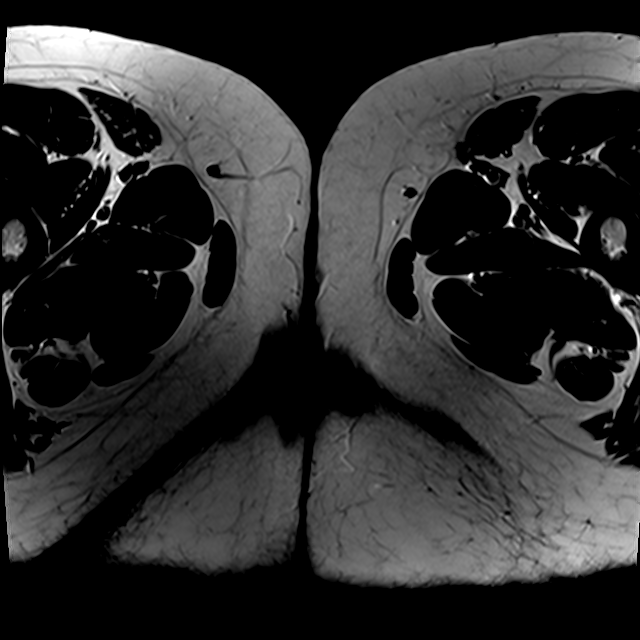

[Series 8: T2 fat-sat · axial · right · 5.0mm · 0.41mm/px · 1 of 33 slices shown]
[im 1/33]
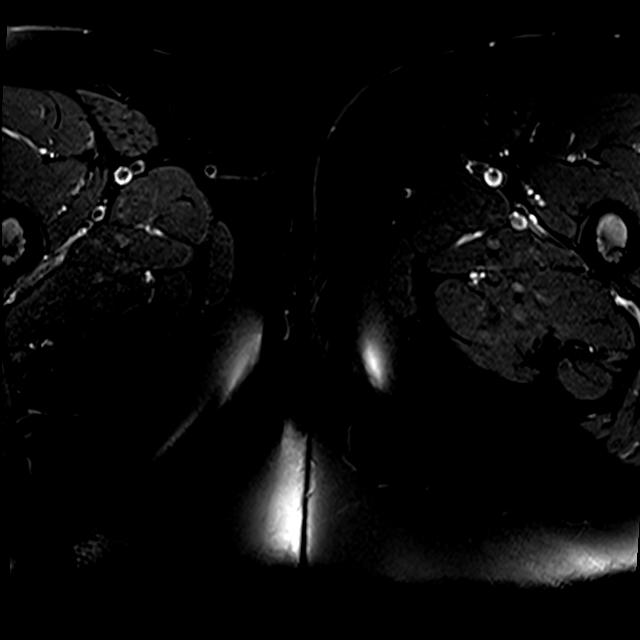

[Series 9: T1 · axial · right · 1.2mm · 0.75mm/px · z∈[-83,+108]mm · 6 of 160 slices shown]
[im 1/160]
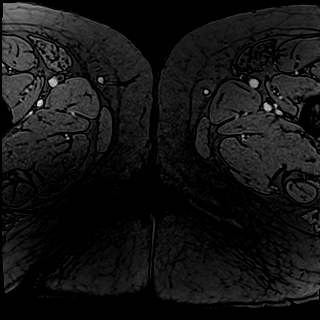
[im 32/160]
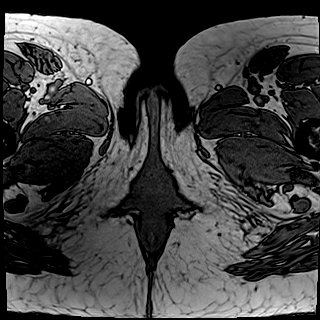
[im 64/160]
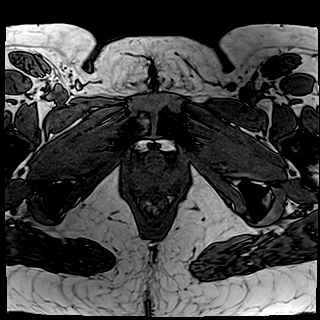
[im 96/160]
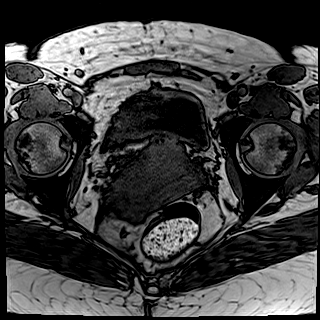
[im 128/160]
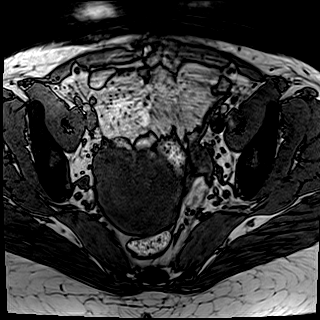
[im 160/160]
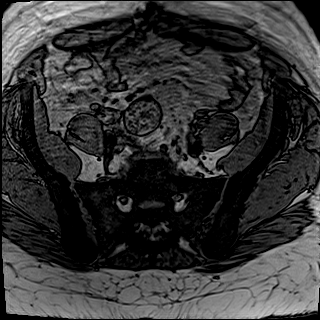

[Series 10: T1 fat-sat · axial · right · 1.2mm · 0.75mm/px · z∈[-73,+98]mm · 5 of 144 slices shown]
[im 1/144]
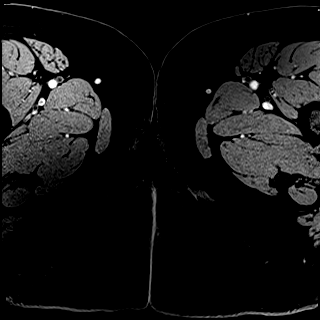
[im 36/144]
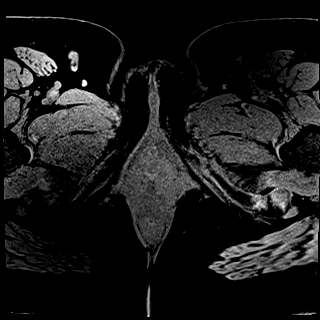
[im 72/144]
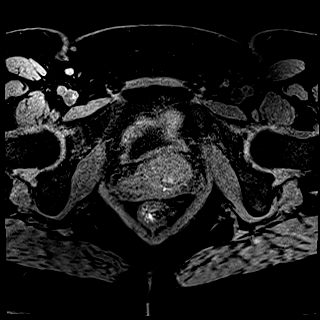
[im 108/144]
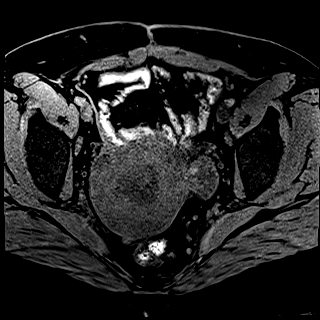
[im 144/144]
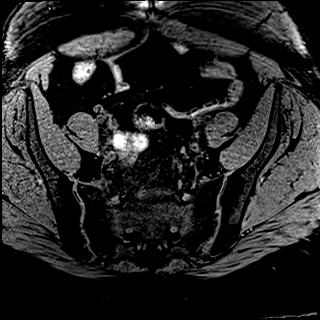

[Series 12: T1 dynamic · sagittal · non-contrast · right · 3.0mm · 0.75mm/px · 2 of 56 slices shown]
[im 1/56]
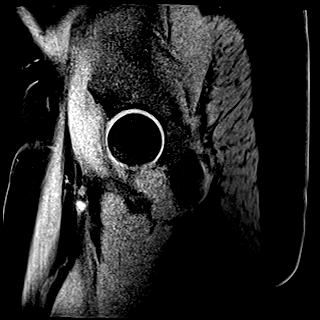
[im 56/56]
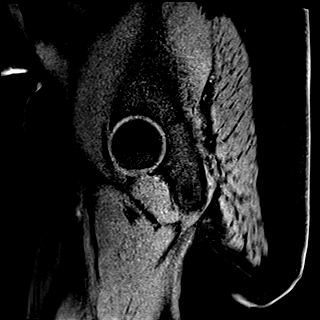

[Series 13: T1 dynamic post-contrast · sagittal · right · 3.0mm · 0.75mm/px · 2 of 56 slices shown (1 of 6)]
[im 1/56]
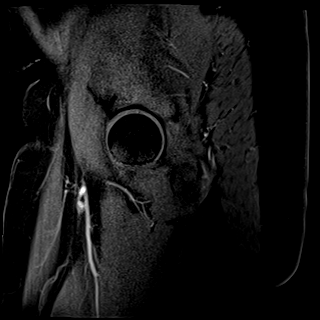
[im 56/56]
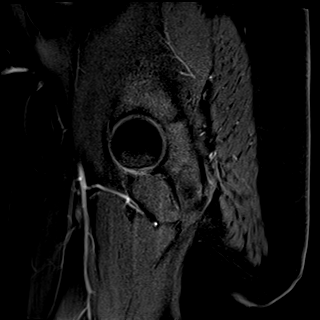

[Series 14: T1 dynamic post-contrast · sagittal · right · 3.0mm · 0.75mm/px · 2 of 56 slices shown (2 of 6)]
[im 1/56]
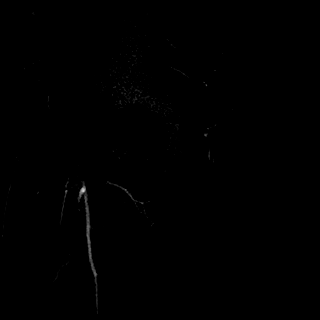
[im 56/56]
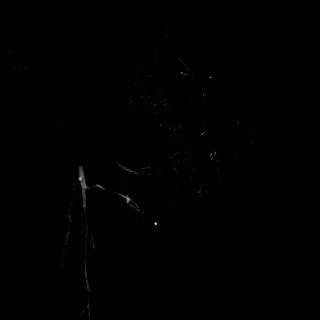

[Series 15: T1 dynamic post-contrast · sagittal · right · 3.0mm · 0.75mm/px · 2 of 56 slices shown (3 of 6)]
[im 1/56]
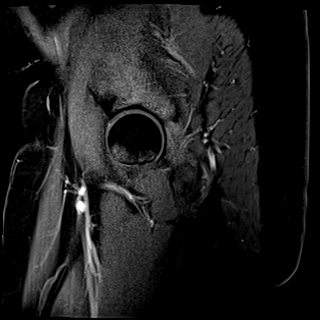
[im 56/56]
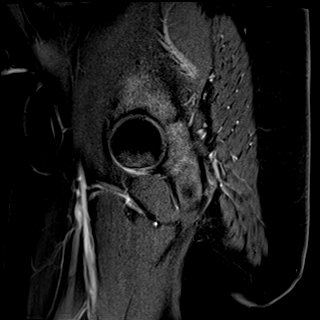

[Series 16: T1 dynamic post-contrast · sagittal · right · 3.0mm · 0.75mm/px · 2 of 56 slices shown (4 of 6)]
[im 1/56]
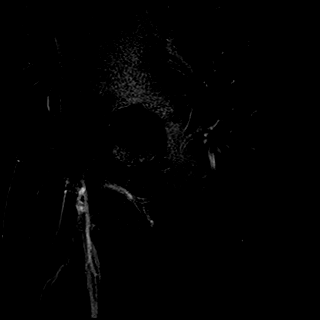
[im 56/56]
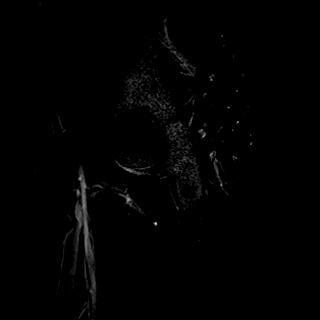

[Series 17: T1 dynamic post-contrast · sagittal · right · 3.0mm · 0.75mm/px · 2 of 56 slices shown (5 of 6)]
[im 1/56]
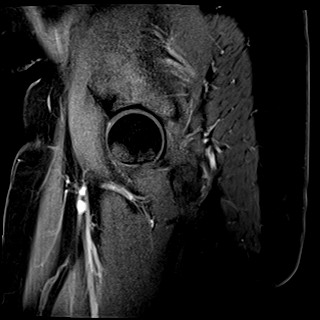
[im 56/56]
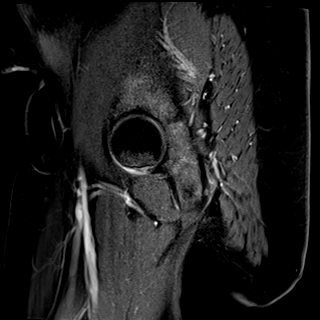

[Series 18: T1 dynamic post-contrast · sagittal · right · 3.0mm · 0.75mm/px · 2 of 56 slices shown (6 of 6)]
[im 1/56]
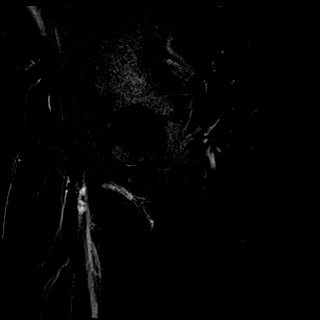
[im 56/56]
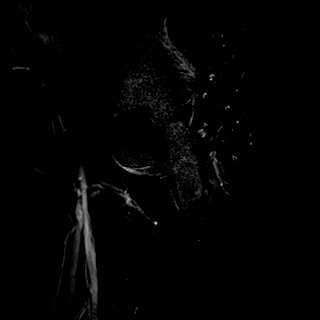

[Series 19: T1 fat-sat post-contrast · axial · right · 1.2mm · 0.75mm/px · z∈[-73,+98]mm · 5 of 144 slices shown (1 of 2)]
[im 1/144]
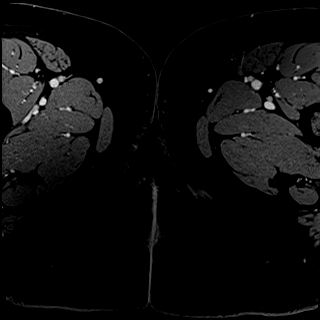
[im 36/144]
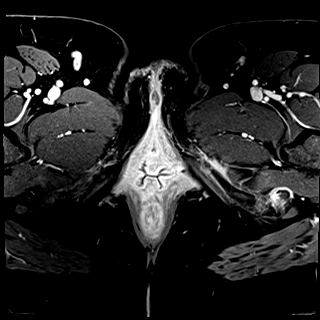
[im 72/144]
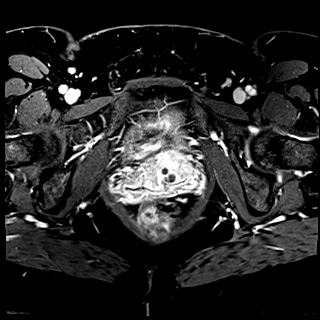
[im 108/144]
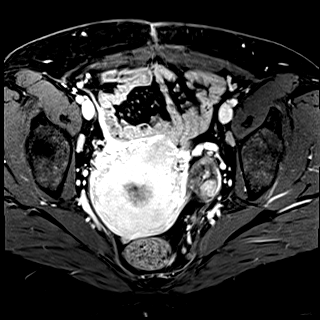
[im 144/144]
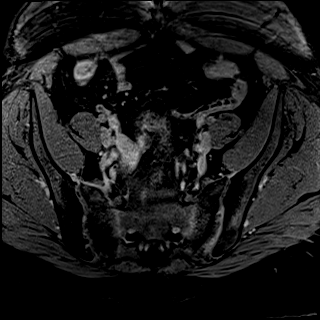

[Series 20: T1 fat-sat post-contrast · axial · right · 1.2mm · 0.75mm/px · z∈[-73,+98]mm · 5 of 144 slices shown (2 of 2)]
[im 1/144]
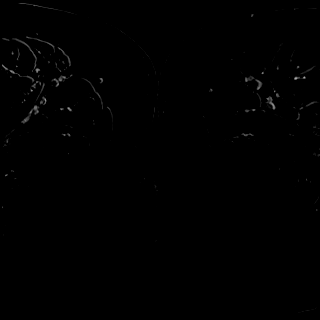
[im 36/144]
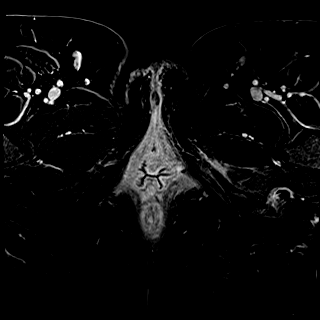
[im 72/144]
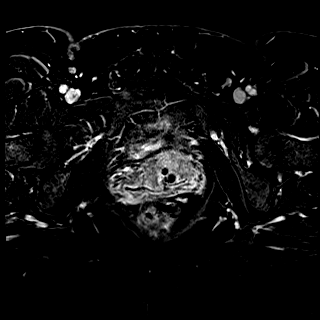
[im 108/144]
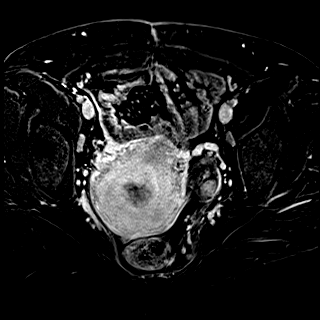
[im 144/144]
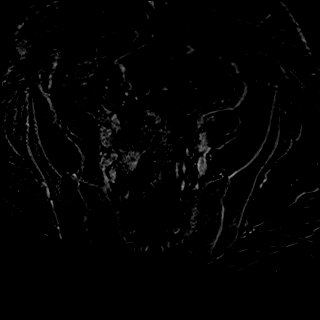

[42 of 48 positions shown; findings below may reference images not displayed]

FINDINGS: Urinary Tract:  Urinary bladder is unremarkable.  No hydronephrosis.

Bowel:  Visualized small and large bowel is unremarkable.

Vascular/Lymphatic: No pelvic adenopathy identified.

Reproductive: Normal appearing retroverted uterus. Post C-section
changes within the anterior lower uterine segment. No discrete
cervical mass is identified. Multiple nabothian cysts within the
cervix. Normal appearance of the right and left ovaries.

Other:  None

Musculoskeletal: No suspicious bone lesions identified.
IMPRESSION: Normal appearance of the cervix.  No cervical mass identified.

## 2019-09-17 MED ORDER — TIZANIDINE HCL 2 MG PO TABS: 2.0000 mg | ORAL_TABLET | Freq: Four times a day (QID) | ORAL | 1 refills | Status: DC | PRN

## 2019-09-17 MED FILL — tiZANidine HCL 2 MG TABS: 2 | 7 days supply | Qty: 60 | Fill #0
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
## 2019-09-18 LAB — C-REACTIVE PROTEIN: CRP: 14 mg/L — ABNORMAL HIGH (ref <8.0)

## 2019-09-18 LAB — CYCLIC CITRUL PEPTIDE ANTIBODY, IGG: Cyclic Citrullin Peptide Ab: <16 UNITS

## 2019-09-18 LAB — VITAMIN D 25 HYDROXY (VIT D DEFICIENCY, FRACTURES): Vit D, 25-Hydroxy: 9 ng/mL — ABNORMAL LOW (ref 30–100)

## 2019-09-18 LAB — SEDIMENTATION RATE: Sed Rate: 19 mm/h (ref 0–20)

## 2019-09-18 LAB — URIC ACID: Uric Acid, Serum: 2.9 mg/dL (ref 2.5–7.0)

## 2019-09-18 LAB — ANA: Anti Nuclear Antibody (ANA): NEGATIVE

## 2019-09-18 LAB — RHEUMATOID FACTOR: Rheumatoid fact SerPl-aCnc: <14 IU/mL (ref <14)

## 2019-09-18 LAB — HLA-B27 ANTIGEN: HLA-B27 Antigen: NEGATIVE

## 2019-09-19 ENCOUNTER — Telehealth: Payer: Self-pay | Admitting: Family Medicine

## 2019-09-19 DIAGNOSIS — R7982 Elevated C-reactive protein (CRP): Secondary | ICD-10-CM

## 2019-09-19 DIAGNOSIS — E559 Vitamin D deficiency, unspecified: Secondary | ICD-10-CM

## 2019-09-19 NOTE — Telephone Encounter (Signed)
Labs are notable for the following:  Vitamin D is severely low at 9.  We want this to be 50-80.  I recommend taking over-the-counter vitamin D3, 5000 IU tablets, 2 of them daily for the next 3 months and then we will recheck the levels.  Once we are in target range, we may be able to decrease the dosage a little bit.  Vitamin D deficiency can be a cause of musculoskeletal pain.  C-reactive protein, inflammation marker, is elevated at 14.  This is a nonspecific test and can be elevated for a variety of reasons.  All of the other labs were normal, which is good.  We will recheck the CRP in about 3 months as well.

## 2019-09-20 NOTE — Telephone Encounter (Signed)
Tried to call patient to let her know this was send to her MyChart, no answer and her voicemail was not setup

## 2019-09-23 ENCOUNTER — Telehealth: Payer: Self-pay | Admitting: Family Medicine

## 2019-09-23 MED ORDER — BACLOFEN 10 MG PO TABS
10.0000 mg | ORAL_TABLET | Freq: Three times a day (TID) | ORAL | 3 refills | Status: DC | PRN
Start: 2019-09-23 — End: 2021-02-17

## 2019-09-23 MED FILL — BACLOFEN 10 MG TABLET: 10 | 10 days supply | Qty: 30 | Fill #0

## 2019-09-23 NOTE — Telephone Encounter (Signed)
Tizanidine is what she is currently taking, along with Ibuprofen 800 mg. Please advise.

## 2019-09-23 NOTE — Addendum Note (Signed)
Addended by: Hortencia Pilar on: 09/23/2019 01:03 PM   Modules accepted: Orders

## 2019-09-23 NOTE — Telephone Encounter (Signed)
I called and advised the patient of the new muscle relaxer. Also, I advised her of all her lab results from 09/17/19 and instructions on the vitamin D3 (she had difficulty accessing MyChart). Scheduled an appointment for repeat bloodwork on 12/24/19 (vit D level and CRP).

## 2019-09-23 NOTE — Telephone Encounter (Signed)
Baclofen Rx sent.

## 2019-09-23 NOTE — Telephone Encounter (Signed)
Patient called.   She stated that the muscle relaxers are not helping. She is requesting something stronger or different be given to her.   Call back: [phone]

## 2019-09-24 MED FILL — IBUPROFEN 800 MG TABLET: 800 | 30 days supply | Qty: 90 | Fill #0

## 2019-10-01 ENCOUNTER — Ambulatory Visit: Payer: Medicaid Other | Attending: Family Medicine | Admitting: Physical Therapy

## 2019-10-01 ENCOUNTER — Encounter: Payer: Self-pay | Admitting: Physical Therapy

## 2019-10-01 ENCOUNTER — Other Ambulatory Visit: Payer: Self-pay

## 2019-10-01 DIAGNOSIS — R293 Abnormal posture: Secondary | ICD-10-CM | POA: Insufficient documentation

## 2019-10-01 DIAGNOSIS — M5440 Lumbago with sciatica, unspecified side: Secondary | ICD-10-CM | POA: Insufficient documentation

## 2019-10-01 DIAGNOSIS — R252 Cramp and spasm: Secondary | ICD-10-CM | POA: Insufficient documentation

## 2019-10-01 DIAGNOSIS — M6281 Muscle weakness (generalized): Secondary | ICD-10-CM | POA: Diagnosis present
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
## 2019-10-01 NOTE — Patient Instructions (Addendum)
    Trigger Point Dry Needling  . What is Trigger Point Dry Needling (DN)? o DN is a physical therapy technique used to treat muscle pain and dysfunction. Specifically, DN helps deactivate muscle trigger points (muscle knots).  o A thin filiform needle is used to penetrate the skin and stimulate the underlying trigger point. The goal is for a local twitch response (LTR) to occur and for the trigger point to relax. No medication of any kind is injected during the procedure.   . What Does Trigger Point Dry Needling Feel Like?  o The procedure feels different for each individual patient. Some patients report that they do not actually feel the needle enter the skin and overall the process is not painful. Very mild bleeding may occur. However, many patients feel a deep cramping in the muscle in which the needle was inserted. This is the local twitch response.   Marland Kitchen How Will I feel after the treatment? o Soreness is normal, and the onset of soreness may not occur for a few hours. Typically this soreness does not last longer than two days.  o Bruising is uncommon, however; ice can be used to decrease any possible bruising.  o In rare cases feeling tired or nauseous after the treatment is normal. In addition, your symptoms may get worse before they get better, this period will typically not last longer than 24 hours.   . What Can I do After My Treatment? o Increase your hydration by drinking more water for the next 24 hours. o You may place ice or heat on the areas treated that have become sore, however, do not use heat on inflamed or bruised areas. Heat often brings more relief post needling. o You can continue your regular activities, but vigorous activity is not recommended initially after the treatment for 24 hours. o DN is best combined with other physical therapy such as strengthening, stretching, and other therapies.    Voncille Lo, PT Certified Exercise Expert for the Aging Adult   10/01/19 10:12 AM Phone: [phone] Fax: [phone]

## 2019-10-10 ENCOUNTER — Ambulatory Visit: Payer: Medicaid Other | Admitting: Physical Therapy

## 2019-10-15 ENCOUNTER — Encounter (HOSPITAL_COMMUNITY): Payer: Self-pay | Admitting: Emergency Medicine

## 2019-10-15 ENCOUNTER — Ambulatory Visit: Payer: Medicaid Other | Attending: Family Medicine | Admitting: Physical Therapy

## 2019-10-15 ENCOUNTER — Ambulatory Visit (HOSPITAL_COMMUNITY)
Admission: EM | Admit: 2019-10-15 | Discharge: 2019-10-15 | Disposition: A | Discharge status: Home / Self Care | Payer: Medicaid Other | Attending: Family Medicine | Admitting: Family Medicine

## 2019-10-15 ENCOUNTER — Telehealth (HOSPITAL_COMMUNITY): Payer: Self-pay

## 2019-10-15 ENCOUNTER — Other Ambulatory Visit: Payer: Self-pay

## 2019-10-15 DIAGNOSIS — R293 Abnormal posture: Secondary | ICD-10-CM

## 2019-10-15 DIAGNOSIS — S76312A Strain of muscle, fascia and tendon of the posterior muscle group at thigh level, left thigh, initial encounter: Secondary | ICD-10-CM | POA: Diagnosis not present

## 2019-10-15 DIAGNOSIS — M79605 Pain in left leg: Secondary | ICD-10-CM

## 2019-10-15 DIAGNOSIS — R252 Cramp and spasm: Secondary | ICD-10-CM

## 2019-10-15 DIAGNOSIS — M6281 Muscle weakness (generalized): Secondary | ICD-10-CM

## 2019-10-15 DIAGNOSIS — M5440 Lumbago with sciatica, unspecified side: Secondary | ICD-10-CM

## 2019-10-15 DIAGNOSIS — I10 Essential (primary) hypertension: Secondary | ICD-10-CM

## 2019-10-15 MED ORDER — OXYCODONE-ACETAMINOPHEN 5-325 MG PO TABS: 1.0000 | ORAL_TABLET | Freq: Four times a day (QID) | ORAL | 0 refills | Status: DC | PRN

## 2019-10-15 NOTE — Therapy (Signed)
[place], [place], [address] Phone: [phone]   Fax:  [phone]  Physical Therapy Treatment/No charge  Patient Details  Name: Gina Shelton MRN: [id number] Date of Birth: [birth year] Referring Provider (PT): Eunice Blase MD   Encounter Date: 10/15/2019   PT End of Session - 10/15/19 0907    Visit Number 2   No charge  Pt going to urgent care after injury yesterday to LT LE yesterday   Number of Visits 16    Date for PT Re-Evaluation 11/26/19    Authorization Type [facility]    PT Start Time 0845    PT Stop Time 0855    PT Time Calculation (min) 10 min    Activity Tolerance Patient limited by pain    Behavior During Therapy [facility] for tasks assessed/performed           Past Medical History:  Diagnosis Date   Anxiety    Depression    Depression    GERD (gastroesophageal reflux disease)    Gout    ble   Migraine    Migraine     Past Surgical History:  Procedure Laterality Date   CESAREAN SECTION  infection at incission requiring return to OR x 2   TUBAL LIGATION      There were no vitals filed for this visit.   Subjective Assessment - 10/15/19 0904    Subjective Pt came into clinici limping on LT leg,  PT noted burn on medial leg scar from 7 months ago.,  Pt could not tolerate PT palpating LT hamstring or knee.  Pt was going to go to urgent care after PT appt.    Currently in Pain? Yes    Pain Score 10-Worst pain ever    Pain Location Knee    Pain Orientation Left                                       PT Short Term Goals - 10/01/19 1013      PT SHORT TERM GOAL #1   Title Independent with initial HEP    Baseline no knowledge    Time 3    Period Weeks    Status New    Target Date 10/22/19      PT SHORT TERM GOAL #2   Title Pt will reduce pain from 8/10 by at least 25% with functional activities.    Baseline 8/10 pain and 25%  AROM of lumbar    Time 3    Period Weeks    Status New    Target Date 10/22/19      PT SHORT TERM GOAL #3   Title Pt will be edcuated on sleep hygiene and basic posture/body mechanics for decrease in injury risk    Baseline Pt with limited AROM of back and unable sleep greater than 2 to 3 hours without pain in back while rolling over    Time 3    Period Weeks    Status New    Target Date 10/22/19      PT SHORT TERM GOAL #4   Title Sit and stand with RT=LT wt bearing to reduce lumbar strain and allow for increased tolerance for these positions for work tasks    Baseline Pt leans to RT to stand unweighting LT side due to pain    Time 3  Period Weeks    Status New    Target Date 10/22/19             PT Long Term Goals - 10/01/19 1416      PT LONG TERM GOAL #1   Title Demonstrate and verbalize techniques to reduce the risk of re-injury including: lifting, posture, body mechanics.    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    Time 8    Period Weeks    Status New    Target Date 11/26/19      PT LONG TERM GOAL #3   Title Pt will perform 5 x STS in 13 sec or less to show increased strength in LE's    Baseline 5 x sts  22.19 sec with compensations to stand    Time 8    Period Weeks    Status New    Target Date 11/26/19      PT LONG TERM GOAL #4   Title Pt will not wake due to pain while turning in bed while sleeping at night    Baseline pt unable to sleep greater than 2-3 hours at night due to back pain while turninig    Time 8    Period Weeks    Status New    Target Date 11/26/19      PT LONG TERM GOAL #5   Title 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 and unable to tolerate exercise or palpation of LT hamstring and back of LT knee.  Pt wanted to declinie PT today and go and be evaluated by MD after trip yesterday on side walk.  Will continue POC at next visit and evaluation of current injury.    Personal Factors and Comorbidities Other           Patient will benefit from skilled therapeutic intervention in order to improve the following deficits and impairments:     Visit Diagnosis: Bilateral low back pain with sciatica, sciatica laterality unspecified, unspecified chronicity  Muscle weakness (generalized)  Cramp and spasm  Abnormal posture     Problem List Patient Active Problem List   Diagnosis Date Noted   COPD exacerbation ([place]) 09/10/2019   Chest pain 09/27/2017   Polysubstance abuse ([place]) 09/27/2017   Hypertensive urgency 09/26/2017   Obesity (BMI 30.0-34.9) 05/22/2017   Ovarian cyst 05/22/2017   Cavernous hemangioma of liver 05/22/2017   Major depressive disorder, single episode, severe without psychosis ([place]) 12/31/2015   Major depressive disorder, recurrent severe without psychotic features ([place]) 12/31/2015   Alcohol dependence ([place]) 06/07/2013   Cocaine abuse ([place]) 06/07/2013   DEPRESSION, MAJOR, RECURRENT 08/17/2007   ANXIETY STATE NOS  11/15/2006    Voncille Lo, PT Certified Exercise Expert for the Aging Adult  10/15/19 9:08 AM Phone: [phone] Fax: [facility] [address] [place], [place], [address] Phone: [phone]   Fax:  [phone]  Name: Gina Shelton MRN: [id number] Date of Birth: 05-01-1975

## 2019-10-15 NOTE — Discharge Instructions (Addendum)
Put ice on leg for 20 min every couple of hours Stay off leg Take the ibuprofen 3 x a day Take the oxycodone if pain severe Do not drive on the pain medicine Follow up with your ortho for the leg pain Follow-up with your primary care doctor for the elevated blood pressure

## 2019-10-15 NOTE — ED Triage Notes (Signed)
Patient goes to physical therapy for her back and spasms in both legs , taking muscle relaxer for this , ibuprofen for pain

## 2019-10-15 NOTE — ED Triage Notes (Signed)
Patient says she stumbled on uneven side walk yesterday about 6-6:30 pm.  Patient did not hit the ground.  Since incident, patient has had pain in left posterior thigh.  From left knee to left foot, patient has aching sensation of front and back of leg.

## 2019-10-15 NOTE — ED Provider Notes (Signed)
[place]    CSN: [id number] Arrival date & time: 10/15/19  0908      History   Chief Complaint Chief Complaint  Patient presents with  . Leg Pain    HPI Gina Shelton is a 44 y.o. female.   HPI  Patient states she is had back pain for about 4 months.  She has seen her primary care doctor.  She has been referred to an orthopedic.  She states she had an x-ray, and an MRI is pending.  She states they sent her to physical therapy.  She is only had 1 or 2 visits.  She states that yesterday when she was walking she tripped over an uneven sidewalk.  She did not fall but she had a sudden jerking wrenching movement.  She had immediate pain in her left thigh.  The area swollen.  Painful with ambulation.  When she went to physical therapy today they told her to get a medical evaluation.  She states the physical therapist's opinion was that she had injured her thigh muscle No numbness or weakness No bowel or bladder complaints Is currently taking ibuprofen for pain Is currently taking tizanidine as a muscle relaxer Neither of these medicines help her sleep last night.  She states her thigh pain is "severe"  Past Medical History:  Diagnosis Date  . Anxiety   . Depression   . Depression   . GERD (gastroesophageal reflux disease)   . Gout    ble  . Migraine   . Migraine     Patient Active Problem List   Diagnosis Date Noted  . COPD exacerbation ([place]) 09/10/2019  . Chest pain 09/27/2017  . Polysubstance abuse ([place]) 09/27/2017  . Hypertensive urgency 09/26/2017  . Obesity (BMI 30.0-34.9) 05/22/2017  . Ovarian cyst 05/22/2017  . Cavernous hemangioma of liver 05/22/2017  . Major depressive disorder, single episode, severe without psychosis ([place]) 12/31/2015  . Major depressive disorder, recurrent severe without psychotic features ([place]) 12/31/2015  . Alcohol dependence ([place]) 06/07/2013  . Cocaine abuse ([place]) 06/07/2013  . DEPRESSION, MAJOR, RECURRENT 08/17/2007  .  ANXIETY STATE NOS 11/15/2006    Past Surgical History:  Procedure Laterality Date  . CESAREAN SECTION  infection at incission requiring return to OR x 2  . TUBAL LIGATION      OB History    Gravida  3   Para  3   Term  0   Preterm  0   AB  0   Living        SAB  0   TAB  0   Ectopic  0   Multiple      Live Births               Home Medications    Prior to Admission medications   Medication Sig Start Date End Date Taking? Authorizing Provider  amLODipine (NORVASC) 10 MG tablet Take 1 tablet (10 mg total) by mouth daily. 09/10/19 10/15/19 Yes [name], NP  hydrochlorothiazide (HYDRODIURIL) 25 MG tablet Take 1 tablet (25 mg total) by mouth daily. 09/10/19  Yes [name], NP  ibuprofen (ADVIL) 800 MG tablet Take 1 tablet (800 mg total) by mouth every 8 (eight) hours as needed. 09/10/19  Yes [name], NP  acetaminophen (TYLENOL) 500 MG tablet Take 1 tablet (500 mg total) by mouth every 6 (six) hours as needed. 07/04/19   Faustino Congress, NP  baclofen (LIORESAL) 10 MG tablet Take 1 tablet (  10 mg total) by mouth 3 (three) times daily as needed for muscle spasms. 09/23/19   Hilts, Michael, MD  Blood Pressure Monitor KIT 1 kit by Does not apply route 3 (three) times daily as needed. 04/04/19   [name], NP  omeprazole (PRILOSEC) 20 MG capsule Take 1 capsule (20 mg total) by mouth daily. 04/04/19   [name], NP  oxyCODONE-acetaminophen (PERCOCET/ROXICET) 5-325 MG tablet Take 1-2 tablets by mouth every 6 (six) hours as needed for severe pain. 10/15/19   Raylene Everts, MD    Family History Family History  Problem Relation Age of Onset  . Hypertension Mother   . Arthritis Sister   . Anesthesia problems Neg Hx   . Hypotension Neg Hx   . Malignant hyperthermia Neg Hx   . Pseudochol deficiency Neg Hx     Social History Social History   Tobacco Use  . Smoking status: Current Every Day Smoker    Packs/day: 1.00     Years: 0.00    Pack years: 0.00    Types: Cigarettes  . Smokeless tobacco: Never Used  Vaping Use  . Vaping Use: Never used  Substance Use Topics  . Alcohol use: Yes    Comment: Consumes several beers every other day per patient stated  . Drug use: No     Allergies   Patient has no known allergies.   Review of Systems Review of Systems See HPI  Physical Exam Triage Vital Signs ED Triage Vitals  Enc Vitals Group     BP 10/15/19 0959 (!) 183/113     Pulse Rate 10/15/19 0959 87     Resp 10/15/19 0959 16     Temp 10/15/19 0959 98.2 F (36.8 C)     Temp Source 10/15/19 0959 Oral     SpO2 10/15/19 0959 99 %     Weight --      Height --      Head Circumference --      Peak Flow --      Pain Score 10/15/19 1005 8     Pain Loc --      Pain Edu? --      Excl. in [place]? --    No data found.  Updated Vital Signs BP (!) 184/124 (BP Location: Right Arm) Comment: rechecked blood pressure after patient sat for a minute  Pulse 87   Temp 98.2 F (36.8 C) (Oral)   Resp 16   LMP 09/26/2019   SpO2 99%      Physical Exam Constitutional:      General: She is not in acute distress.    Appearance: She is well-developed.     Comments: Very uncomfortable.  Sits on the chair with her left leg extended.  Resists bending the knee, or moving the leg.   antalgic gait.  HENT:     Head: Normocephalic and atraumatic.     Mouth/Throat:     Comments: Mask is in place Eyes:     Conjunctiva/sclera: Conjunctivae normal.     Pupils: Pupils are equal, round, and reactive to light.  Cardiovascular:     Rate and Rhythm: Normal rate.  Pulmonary:     Effort: Pulmonary effort is normal. No respiratory distress.  Abdominal:     General: There is no distension.     Palpations: Abdomen is soft.  Musculoskeletal:        General: Normal range of motion.     Cervical back: Normal range of motion.  Legs:  Skin:    General: Skin is warm and dry.  Neurological:     Mental Status: She is  alert.     Gait: Gait abnormal.     Deep Tendon Reflexes: Reflexes normal.  Psychiatric:        Behavior: Behavior normal.      UC Treatments / Results  Labs (all labs ordered are listed, but only abnormal results are displayed) Labs Reviewed - No data to display  EKG   Radiology No results found.  Procedures Procedures (including critical care time)  Medications Ordered in UC Medications - No data to display  Initial Impression / Assessment and Plan / UC Course  I have reviewed the triage vital signs and the nursing notes.  Pertinent labs & imaging results that were available during my care of the patient were reviewed by me and considered in my medical decision making (see chart for details).     Patient has a partial tear of her hamstring muscle on the left posterior thigh.  This will have to heal conservatively.  It may interfere with her physical therapy for her back.  She should follow-up with her orthopedic if not improving by the end of the week Final Clinical Impressions(s) / UC Diagnoses   Final diagnoses:  Pulled hamstring, left, initial encounter  Left leg pain  Essential hypertension     Discharge Instructions     Put ice on leg for 20 min every couple of hours Stay off leg Take the ibuprofen 3 x a day Take the oxycodone if pain severe Do not drive on the pain medicine Follow up with your ortho for the leg pain Follow-up with your primary care doctor for the elevated blood pressure      ED Prescriptions    Medication Sig Dispense Auth. Provider   oxyCODONE-acetaminophen (PERCOCET/ROXICET) 5-325 MG tablet Take 1-2 tablets by mouth every 6 (six) hours as needed for severe pain. 10 tablet Raylene Everts, MD     I have reviewed the PDMP during this encounter.   Raylene Everts, MD 10/15/19 [phone]

## 2019-10-22 ENCOUNTER — Other Ambulatory Visit: Payer: Self-pay

## 2019-10-22 ENCOUNTER — Ambulatory Visit: Payer: Medicaid Other | Admitting: Physical Therapy

## 2019-10-22 ENCOUNTER — Encounter: Payer: Self-pay | Admitting: Physical Therapy

## 2019-10-22 DIAGNOSIS — R252 Cramp and spasm: Secondary | ICD-10-CM

## 2019-10-22 DIAGNOSIS — R293 Abnormal posture: Secondary | ICD-10-CM | POA: Diagnosis present

## 2019-10-22 DIAGNOSIS — M5440 Lumbago with sciatica, unspecified side: Secondary | ICD-10-CM

## 2019-10-22 DIAGNOSIS — M6281 Muscle weakness (generalized): Secondary | ICD-10-CM | POA: Diagnosis present

## 2019-10-22 NOTE — Therapy (Addendum)
[place], [place], [address] Phone: [phone]   Fax:  [phone]  Physical Therapy Treatment/discharge Note  Patient Details  Name: Gina Shelton MRN: [id number] Date of Birth: April 21, 1975 Referring Provider (PT): Eunice Blase MD   Encounter Date: 10/22/2019   PT End of Session - 10/22/19 0905    Visit Number 3    Number of Visits 16    Date for PT Re-Evaluation 11/26/19    Authorization Type [employer] approved though 10-31-19    PT Start Time 0845    PT Stop Time 0945    PT Time Calculation (min) 60 min    Activity Tolerance Patient limited by pain    Behavior During Therapy [facility] for tasks assessed/performed           Past Medical History:  Diagnosis Date  . Anxiety   . Depression   . Depression   . GERD (gastroesophageal reflux disease)   . Gout    ble  . Migraine   . Migraine     Past Surgical History:  Procedure Laterality Date  . CESAREAN SECTION  infection at incission requiring return to OR x 2  . TUBAL LIGATION      There were no vitals filed for this visit.   Subjective Assessment - 10/22/19 0851    Subjective I went to urgent care and they gave me medicince for my LT hamstring strain.    Pertinent History COPD, GERD, HTN, see medical history    Limitations Standing;Walking;House hold activities    How long can you sit comfortably? unlimited    Diagnostic tests X-rays lumbar spine reveal sclerotic changes of both sacroiliac joints.   She has mild L2-3 and L3-4 degenerative disc disease    Patient Stated Goals stand longer, help pain,  sleep better    Currently in Pain? Yes    Pain Score 6     Pain Location Leg   hamstring   Pain Orientation Left    Pain Descriptors / Indicators Aching;Sore    Pain Type Acute pain    Pain Onset In the past 7 days    Pain Frequency Constant    Aggravating Factors  bending leg    Multiple Pain Sites Yes    Pain Score 8     Pain Location Back    Pain Orientation Left;Right    Pain Descriptors / Indicators Aching    Pain Type Chronic pain    Pain Onset More than a month ago    Pain Frequency Intermittent    Aggravating Factors  bending forward  walking and turning                             [facility] Adult PT Treatment/Exercise - 10/22/19 0001      Self-Care   Self-Care ADL's;Lifting;Posture    ADL's went over handout briefly but worked on pain relief standing for ADL    Lifting went over verbally how to properly lift    Posture sleeping hygiene with postures for sleeping      Lumbar Exercises: Stretches   Active Hamstring Stretch Right;Left;3 reps;30 seconds    Active Hamstring Stretch Limitations using stretch out strap    Single Knee to Chest Stretch Right;Left;5 reps;10 seconds    Lower Trunk Rotation 5 reps;20 seconds    Lower Trunk Rotation Limitations rt and LT      Lumbar Exercises:  Supine   Bridge with [employer] 10 reps    Bridge with [employer] Limitations x 3 VC and TC      Lumbar Exercises: Quadruped   Other Quadruped Lumbar Exercises cat camel then childs pose x 10      Knee/Hip Exercises: Supine   Other Supine Knee/Hip Exercises long sitting hamstring isometric with intoeing and then out toeing.  15 x each 5-10 sec hold      Modalities   Modalities Moist Heat      Moist Heat Therapy   Number Minutes Moist Heat 15 Minutes    Moist Heat Location Lumbar Spine   RT QL     Manual Therapy   Manual Therapy Joint mobilization;Soft tissue mobilization    Manual therapy comments skilled palpation for TPDN    Joint Mobilization RT side lying with thrust to lumbar paraspinals with anterior PA to hip,   Pt pain reduced to 6/10   Soft tissue mobilization LT QL and bil lumbar paraspinals            Trigger Point Dry Needling - 10/22/19 0001    Consent Given? Yes    Muscles Treated Back/Hip Lumbar multifidi;Quadratus lumborum   LT   Dry Needling Comments 75 mm .30  gage  40 mm .30 gage    Lumbar multifidi Response Twitch response elicited;Palpable increased muscle length   L-2 to L-5 bil   Quadratus Lumborum Response Twitch response elicited;Palpable increased muscle length                PT Education - 10/22/19 0912    Education Details Added to HEP with strength and isometric for hamstring strain    Person(s) Educated Patient    Methods Explanation;Demonstration;Tactile cues;Verbal cues;Handout    Comprehension Verbalized understanding;Returned demonstration            PT Short Term Goals - 10/01/19 1013      PT SHORT TERM GOAL #1   Title Independent with initial HEP    Baseline no knowledge    Time 3    Period Weeks    Status New    Target Date 10/22/19      PT SHORT TERM GOAL #2   Title Pt will reduce pain from 8/10 by at least 25% with functional activities.    Baseline 8/10 pain and 25% AROM of lumbar    Time 3    Period Weeks    Status New    Target Date 10/22/19      PT SHORT TERM GOAL #3   Title Pt will be edcuated on sleep hygiene and basic posture/body mechanics for decrease in injury risk    Baseline Pt with limited AROM of back and unable sleep greater than 2 to 3 hours without pain in back while rolling over    Time 3    Period Weeks    Status New    Target Date 10/22/19      PT SHORT TERM GOAL #4   Title Sit and stand with RT=LT wt bearing to reduce lumbar strain and allow for increased tolerance for these positions for work tasks    Baseline Pt leans to RT to stand unweighting LT side due to pain    Time 3    Period Weeks    Status New    Target Date 10/22/19             PT Long Term Goals - 10/01/19 1416  PT LONG TERM GOAL #1   Title Demonstrate and verbalize techniques to reduce the risk of re-injury including: lifting, posture, body mechanics.    Baseline limited knowledge    Time 8    Period Weeks    Status New    Target Date 11/26/19      PT LONG TERM GOAL #2   Title Report a 60%  reduction in pain with home and work tasks    Baseline 8/10 pain and unable to lift client and a personal care assistant    Time 8    Period Weeks    Status New    Target Date 11/26/19      PT LONG TERM GOAL #3   Title Pt will perform 5 x STS in 13 sec or less to show increased strength in LE's    Baseline 5 x sts  22.19 sec with compensations to stand    Time 8    Period Weeks    Status New    Target Date 11/26/19      PT LONG TERM GOAL #4   Title Pt will not wake due to pain while turning in bed while sleeping at night    Baseline pt unable to sleep greater than 2-3 hours at night due to back pain while turninig    Time 8    Period Weeks    Status New    Target Date 11/26/19      PT LONG TERM GOAL #5   Title 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, dispensing    Baseline unable to do work tasks at all due to back pain and AROM lumbar limited to 25 %    Time 8    Period Weeks    Status New    Target Date 11/26/19              Access Code: [email or web address]: [email or web address]: 08/10/2021Prepared by: Donnetta Simpers BeardsleyExercises   Long Sitting Hamstring Set - Internal Rotation - 2 x daily - 7 x weekly - 2 sets - 10 reps - 5-10 hold  Long Sitting Hamstring Set - External Rotation - 2 x daily - 7 x weekly - 2 sets - 10 reps - 5-10 sec hold  Supine Bridge with Mini Swiss Ball Between Knees - 1 x daily - 7 x weekly - 3 sets - 10 reps     Plan - 10/22/19 1131    Clinical Impression Statement Ms Segar enters clinic after leaving last visit with no charge and sent to urgent care for dx of hamstring strain.  Pt with 6/10 LT hamstring strain and 8/10 back pain.  Pt educated on sleep hygiene, ADL body mechanic and lifting technique initial teaching with handout.  Pt reviewed HEP and added hamstring isometrics. Pt consented to TPDN for low back and was closely monitiored.  Pt was reducved to 6/10 pain after RX.  Will conintue POC     Personal Factors and Comorbidities Other    Examination-Activity Limitations Squat;Stand;Locomotion Level    PT Frequency 2x / week    PT Duration 8 weeks    PT Treatment/Interventions ADLs/Self Care Home Management;Cryotherapy;Electrical Stimulation;Moist Heat;Traction;Ultrasound;Therapeutic exercise;Therapeutic activities;Stair training;Gait training;Functional mobility training;Neuromuscular re-education;Patient/family education;Passive range of motion;Manual techniques;Dry needling;Taping;Joint Manipulations;Spinal Manipulations    PT Next Visit Plan work on hip hinge and more standing exericises as able    PT [place],, trunk roation cat camel to childs pose, ab  set  pelvic tilt bridge with ball, hamstring ER/IR isometric    Consulted and Agree with Plan of Care Patient           Patient will benefit from skilled therapeutic intervention in order to improve the following deficits and impairments:  Abnormal gait, Decreased mobility, Decreased range of motion, Decreased strength, Increased muscle spasms, Impaired flexibility, Postural dysfunction, Improper body mechanics, Pain, Obesity  Visit Diagnosis: Bilateral low back pain with sciatica, sciatica laterality unspecified, unspecified chronicity  Muscle weakness (generalized)  Cramp and spasm  Abnormal posture     Problem List Patient Active Problem List   Diagnosis Date Noted  . COPD exacerbation ([place]) 09/10/2019  . Chest pain 09/27/2017  . Polysubstance abuse ([place]) 09/27/2017  . Hypertensive urgency 09/26/2017  . Obesity (BMI 30.0-34.9) 05/22/2017  . Ovarian cyst 05/22/2017  . Cavernous hemangioma of liver 05/22/2017  . Major depressive disorder, single episode, severe without psychosis ([place]) 12/31/2015  . Major depressive disorder, recurrent severe without psychotic features ([place]) 12/31/2015  . Alcohol dependence ([place]) 06/07/2013  . Cocaine abuse ([place]) 06/07/2013  . DEPRESSION, MAJOR,  RECURRENT 08/17/2007  . ANXIETY STATE NOS 11/15/2006    Voncille Lo, PT Certified Exercise Expert for the Aging Adult  10/22/19 11:39 AM Phone: [phone] Fax: [facility] [address] [place], [place], [address] Phone: [phone]   Fax:  [phone]  Name: MARIALUIZA CAR MRN: [id number] Date of Birth: February 16, 1976   PHYSICAL THERAPY DISCHARGE SUMMARY  Visits from Start of Care: 3  Current functional level related to goals / functional outcomes: unknown   Remaining deficits: unknown   Education / Equipment: iniital HEP Plan:                                                    Patient goals were not met. Patient is being discharged due to not returning since the last visit.  ?????    Suspected Covid infection and never returned to [place], [place], [facility] Certified Exercise Expert for the Aging Adult  01/21/20 2:46 PM Phone: [phone] Fax: [phone]

## 2019-10-22 NOTE — Patient Instructions (Addendum)
Sleep Tips    .Keep a consistent sleep schedule. Get up at the same time every day, even on weekends or during vacations. .Set a bedtime that is early enough for you to get at least 7 hours of sleep. .Don't go to bed unless you are sleepy.  .If you don't fall asleep after 20 minutes, get out of bed.  .Establish a relaxing bedtime routine.  .Use your bed only for sleep and sex.  .Make your bedroom quiet and relaxing. Keep the room at a comfortable, cool temperature.  .Limit exposure to bright light in the evenings. .Turn off electronic devices at least 30 minutes before bedtime. .Don't eat a large meal before bedtime. If you are hungry at night, eat a light, healthy snack.  .Exercise regularly and maintain a healthy diet.  Marland KitchenAvoid consuming caffeine in the late afternoon or evening.  .Avoid consuming alcohol before bedtime.  .Reduce your fluid intake before bedtime.  Sleeping on Back  Place pillow under knees. A pillow with cervical support and a roll around waist are also helpful. Copyright  VHI. All rights reserved.  Sleeping on Side Place pillow between knees. Use cervical support under neck and a roll around waist as needed. Copyright  VHI. All rights reserved.   Sleeping on Stomach   If this is the only desirable sleeping position, place pillow under lower legs, and under stomach or chest as needed.  Posture - Sitting   Sit upright, head facing forward. Try using a roll to support lower back. Keep shoulders relaxed, and avoid rounded back. Keep hips level with knees. Avoid crossing legs for long periods. Stand to Sit / Sit to Stand   To sit: Bend knees to lower self onto front edge of chair, then scoot back on seat. To stand: Reverse sequence by placing one foot forward, and scoot to front of seat. Use rocking motion to stand up.   Work Height and Reach  Ideal work height is no more than 2 to 4 inches below elbow level when standing, and at elbow level when sitting.  Reaching should be limited to arm's length, with elbows slightly bent.  Bending  Bend at hips and knees, not back. Keep feet shoulder-width apart.    Posture - Standing   Good posture is important. Avoid slouching and forward head thrust. Maintain curve in low back and align ears over shoul- ders, hips over ankles.  Alternating Positions   Alternate tasks and change positions frequently to reduce fatigue and muscle tension. Take rest breaks. Computer Work   Position work to Programmer, multimedia. Use proper work and seat height. Keep shoulders back and down, wrists straight, and elbows at right angles. Use chair that provides full back support. Add footrest and lumbar roll as needed.  Getting Into / Out of Car  Lower self onto seat, scoot back, then bring in one leg at a time. Reverse sequence to get out.  Dressing  Lie on back to pull socks or slacks over feet, or sit and bend leg while keeping back straight.    Housework - Sink  Place one foot on ledge of cabinet under sink when standing at sink for prolonged periods.   Pushing / Pulling  Pushing is preferable to pulling. Keep back in proper alignment, and use leg muscles to do the work.  Deep Squat   Squat and lift with both arms held against upper trunk. Tighten stomach muscles without holding breath. Use smooth movements to avoid jerking.  Avoid Twisting  Avoid twisting or bending back. Pivot around using foot movements, and bend at knees if needed when reaching for articles.  Carrying Luggage   Distribute weight evenly on both sides. Use a cart whenever possible. Do not twist trunk. Move body as a unit.   Lifting Principles .Maintain proper posture and head alignment. .Slide object as close as possible before lifting. .Move obstacles out of the way. .Test before lifting; ask for help if too heavy. .Tighten stomach muscles without holding breath. .Use smooth movements; do not jerk. .Use legs to do the work,  and pivot with feet. .Distribute the work load symmetrically and close to the center of trunk. .Push instead of pull whenever possible.   Ask For Help   Ask for help and delegate to others when possible. Coordinate your movements when lifting together, and maintain the low back curve.  Log Roll   Lying on back, bend left knee and place left arm across chest. Roll all in one movement to the right. Reverse to roll to the left. Always move as one unit. Housework - Sweeping  Use long-handled equipment to avoid stooping.   Housework - Wiping  Position yourself as close as possible to reach work surface. Avoid straining your back.  Laundry - Unloading Wash   To unload small items at bottom of washer, lift leg opposite to arm being used to reach.  [place] close to area to be raked. Use arm movements to do the work. Keep back straight and avoid twisting.     Cart  When reaching into cart with one arm, lift opposite leg to keep back straight.   Getting Into / Out of Bed  Lower self to lie down on one side by raising legs and lowering head at the same time. Use arms to assist moving without twisting. Bend both knees to roll onto back if desired. To sit up, start from lying on side, and use same move-ments in reverse. Housework - Vacuuming  Hold the vacuum with arm held at side. Step back and forth to move it, keeping head up. Avoid twisting.   Laundry - IT consultant so that bending and twisting can be avoided.   Laundry - Unloading Dryer  Squat down to reach into clothes dryer or use a reacher.  Gardening - Weeding / Probation officer or Kneel. Knee pads may be helpful.                   Gina Shelton, PT Certified Exercise Expert for the Aging Adult  10/22/19 9:07 AM Phone: [phone] Fax: [phone]

## 2019-10-29 ENCOUNTER — Ambulatory Visit: Payer: Medicaid Other | Admitting: Physical Therapy

## 2019-11-08 ENCOUNTER — Other Ambulatory Visit: Payer: Self-pay

## 2019-11-08 ENCOUNTER — Other Ambulatory Visit: Payer: Medicaid Other

## 2019-11-08 DIAGNOSIS — Z20822 Contact with and (suspected) exposure to covid-19: Secondary | ICD-10-CM

## 2019-11-09 LAB — SARS-COV-2, NAA 2 DAY TAT

## 2019-11-09 LAB — NOVEL CORONAVIRUS, NAA: SARS-CoV-2, NAA: NOT DETECTED

## 2019-12-06 MED FILL — IBUPROFEN 800 MG TABLET: 800 | 30 days supply | Qty: 90 | Fill #1

## 2019-12-06 MED FILL — tiZANidine HCL 2 MG TABS: 2 | 7 days supply | Qty: 60 | Fill #1

## 2019-12-24 ENCOUNTER — Ambulatory Visit: Payer: Medicaid Other

## 2019-12-31 ENCOUNTER — Emergency Department (HOSPITAL_COMMUNITY)
Admission: EM | Admit: 2019-12-31 | Discharge: 2019-12-31 | Disposition: A | Discharge status: Home / Self Care | Payer: Medicaid Other | Attending: Emergency Medicine | Admitting: Emergency Medicine

## 2019-12-31 ENCOUNTER — Emergency Department (HOSPITAL_COMMUNITY): Payer: Medicaid Other

## 2019-12-31 ENCOUNTER — Ambulatory Visit: Payer: Self-pay

## 2019-12-31 ENCOUNTER — Other Ambulatory Visit: Payer: Self-pay

## 2019-12-31 DIAGNOSIS — F17210 Nicotine dependence, cigarettes, uncomplicated: Secondary | ICD-10-CM | POA: Diagnosis not present

## 2019-12-31 DIAGNOSIS — R531 Weakness: Secondary | ICD-10-CM | POA: Diagnosis present

## 2019-12-31 DIAGNOSIS — R519 Headache, unspecified: Secondary | ICD-10-CM | POA: Diagnosis not present

## 2019-12-31 DIAGNOSIS — J441 Chronic obstructive pulmonary disease with (acute) exacerbation: Secondary | ICD-10-CM | POA: Diagnosis not present

## 2019-12-31 LAB — I-STAT CHEM 8, ED
BUN: 8 mg/dL (ref 6–20)
Calcium, Ion: 1.19 mmol/L (ref 1.15–1.40)
Chloride: 109 mmol/L (ref 98–111)
Creatinine, Ser: 0.5 mg/dL (ref 0.44–1.00)
Glucose, Bld: 104 mg/dL — ABNORMAL HIGH (ref 70–99)
HCT: 37 % (ref 36.0–46.0)
Hemoglobin: 12.6 g/dL (ref 12.0–15.0)
Potassium: 3.8 mmol/L (ref 3.5–5.1)
Sodium: 140 mmol/L (ref 135–145)
TCO2: 22 mmol/L (ref 22–32)

## 2019-12-31 LAB — CBC
HCT: 36.5 % (ref 36.0–46.0)
Hemoglobin: 10.9 g/dL — ABNORMAL LOW (ref 12.0–15.0)
MCH: 25.7 pg — ABNORMAL LOW (ref 26.0–34.0)
MCHC: 29.9 g/dL — ABNORMAL LOW (ref 30.0–36.0)
MCV: 86.1 fL (ref 80.0–100.0)
Platelets: 348 10*3/uL (ref 150–400)
RBC: 4.24 MIL/uL (ref 3.87–5.11)
RDW: 18.4 % — ABNORMAL HIGH (ref 11.5–15.5)
WBC: 7.2 10*3/uL (ref 4.0–10.5)
nRBC: 0 % (ref 0.0–0.2)

## 2019-12-31 LAB — PROTIME-INR
INR: 0.9 (ref 0.8–1.2)
Prothrombin Time: 12.1 seconds (ref 11.4–15.2)

## 2019-12-31 LAB — DIFFERENTIAL
Abs Immature Granulocytes: 0.03 10*3/uL (ref 0.00–0.07)
Basophils Absolute: 0 10*3/uL (ref 0.0–0.1)
Basophils Relative: 1 %
Eosinophils Absolute: 0.1 10*3/uL (ref 0.0–0.5)
Eosinophils Relative: 1 %
Immature Granulocytes: 0 %
Lymphocytes Relative: 24 %
Lymphs Abs: 1.7 10*3/uL (ref 0.7–4.0)
Monocytes Absolute: 0.7 10*3/uL (ref 0.1–1.0)
Monocytes Relative: 9 %
Neutro Abs: 4.7 10*3/uL (ref 1.7–7.7)
Neutrophils Relative %: 65 %

## 2019-12-31 LAB — COMPREHENSIVE METABOLIC PANEL
ALT: 20 U/L (ref 0–44)
AST: 22 U/L (ref 15–41)
Albumin: 4 g/dL (ref 3.5–5.0)
Alkaline Phosphatase: 100 U/L (ref 38–126)
Anion gap: 10 (ref 5–15)
BUN: 8 mg/dL (ref 6–20)
CO2: 23 mmol/L (ref 22–32)
Calcium: 9.4 mg/dL (ref 8.9–10.3)
Chloride: 108 mmol/L (ref 98–111)
Creatinine, Ser: 0.57 mg/dL (ref 0.44–1.00)
GFR, Estimated: >60 mL/min (ref >60)
Glucose, Bld: 106 mg/dL — ABNORMAL HIGH (ref 70–99)
Potassium: 3.8 mmol/L (ref 3.5–5.1)
Sodium: 141 mmol/L (ref 135–145)
Total Bilirubin: 0.4 mg/dL (ref 0.3–1.2)
Total Protein: 6.9 g/dL (ref 6.5–8.1)

## 2019-12-31 LAB — APTT: aPTT: 25 seconds (ref 24–36)

## 2019-12-31 LAB — I-STAT BETA HCG BLOOD, ED (MC, WL, AP ONLY): I-stat hCG, quantitative: <5 m[IU]/mL (ref <5)

## 2019-12-31 MED ORDER — GADOBUTROL 1 MMOL/ML IV SOLN
8.0000 mL | Freq: Once | INTRAVENOUS | Status: AC | PRN
  Administered 2019-12-31: 8 mL via INTRAVENOUS

## 2019-12-31 MED ORDER — DIPHENHYDRAMINE HCL 50 MG/ML IJ SOLN
25.0000 mg | Freq: Once | INTRAMUSCULAR | Status: AC
  Administered 2019-12-31: 25 mg via INTRAVENOUS
  Filled 2019-12-31: qty 1

## 2019-12-31 MED ORDER — SODIUM CHLORIDE 0.9 % IV BOLUS
1000.0000 mL | Freq: Once | INTRAVENOUS | Status: AC
  Administered 2019-12-31: 1000 mL via INTRAVENOUS

## 2019-12-31 MED ORDER — METOCLOPRAMIDE HCL 5 MG/ML IJ SOLN
10.0000 mg | Freq: Once | INTRAMUSCULAR | Status: AC
  Administered 2019-12-31: 10 mg via INTRAVENOUS
  Filled 2019-12-31: qty 2

## 2019-12-31 MED ORDER — LORAZEPAM 1 MG PO TABS
2.0000 mg | ORAL_TABLET | Freq: Once | ORAL | Status: AC
  Administered 2019-12-31: 2 mg via SUBLINGUAL
  Filled 2019-12-31: qty 2

## 2019-12-31 MED ORDER — SODIUM CHLORIDE 0.9% FLUSH: 3.0000 mL | Freq: Once | INTRAVENOUS | Status: DC

## 2019-12-31 NOTE — ED Notes (Signed)
Pt transported to CT ?

## 2019-12-31 NOTE — ED Notes (Signed)
Pt transported to MRI 

## 2019-12-31 NOTE — ED Provider Notes (Signed)
[facility] Provider Note   CSN: [id number] Arrival date & time: 12/31/19  1106     History Chief Complaint  Patient presents with  . Weakness    [facility] is a 44 y.o. female.   Weakness Severity:  Moderate Onset quality:  Sudden Timing:  Constant Chronicity:  New Context comment:  High blood pressure Relieved by:  Nothing Worsened by:  Nothing Ineffective treatments:  None tried Associated symptoms: headaches   Associated symptoms: no arthralgias, no chest pain, no cough, no diarrhea, no dysuria, no fever, no nausea, no shortness of breath and no vomiting        Past Medical History:  Diagnosis Date  . Anxiety   . Depression   . Depression   . GERD (gastroesophageal reflux disease)   . Gout    ble  . Migraine   . Migraine     Patient Active Problem List   Diagnosis Date Noted  . COPD exacerbation ([place]) 09/10/2019  . Chest pain 09/27/2017  . Polysubstance abuse ([place]) 09/27/2017  . Hypertensive urgency 09/26/2017  . Obesity (BMI 30.0-34.9) 05/22/2017  . Ovarian cyst 05/22/2017  . Cavernous hemangioma of liver 05/22/2017  . Major depressive disorder, single episode, severe without psychosis ([place]) 12/31/2015  . 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 Alcohol dependence ([place]) 06/07/2013  . Cocaine abuse ([place]) 06/07/2013  . DEPRESSION, MAJOR, RECURRENT 08/17/2007  . ANXIETY STATE NOS 11/15/2006    Past Surgical History:  Procedure Laterality Date  . CESAREAN SECTION  infection at incission requiring return to OR x 2  . TUBAL LIGATION       OB History    Gravida  3   Para  3   Term  0   Preterm  0   AB  0   Living        SAB  0   TAB  0   Ectopic  0   Multiple      Live Births              Family History  Problem Relation Age of Onset  . Hypertension Mother   . Arthritis Sister   . Anesthesia problems Neg Hx   . Hypotension  Neg Hx   . Malignant hyperthermia Neg Hx   . Pseudochol deficiency Neg Hx     Social History   Tobacco Use  . Smoking status: Current Every Day Smoker    Packs/day: 1.00    Years: 0.00    Pack years: 0.00    Types: Cigarettes  . Smokeless tobacco: Never Used  Vaping Use  . Vaping Use: Never used  Substance Use Topics  . Alcohol use: Yes    Comment: Consumes several beers every other day per patient stated  . Drug use: No    Home Medications Prior to Admission medications   Medication Sig Start Date End Date Taking? Authorizing Provider  acetaminophen (TYLENOL) 500 MG tablet Take 1 tablet (500 mg total) by mouth every 6 (six) hours as needed. 07/04/19   Faustino Congress, NP  amLODipine (NORVASC) 10 MG tablet Take 1 tablet (10 mg total) by mouth daily. 09/10/19 10/15/19  [name], NP  baclofen (LIORESAL) 10 MG tablet Take 1 tablet (10 mg total) by mouth 3 (three) times daily as needed for muscle spasms. 09/23/19   Hilts, Michael, MD  Blood Pressure Monitor KIT 1 kit by Does not apply route 3 (three)  times daily as needed. 04/04/19   [name], NP  hydrochlorothiazide (HYDRODIURIL) 25 MG tablet Take 1 tablet (25 mg total) by mouth daily. 09/10/19   [name], NP  ibuprofen (ADVIL) 800 MG tablet Take 1 tablet (800 mg total) by mouth every 8 (eight) hours as needed. 09/10/19   [name], NP  omeprazole (PRILOSEC) 20 MG capsule Take 1 capsule (20 mg total) by mouth daily. 04/04/19   [name], NP  oxyCODONE-acetaminophen (PERCOCET/ROXICET) 5-325 MG tablet Take 1-2 tablets by mouth every 6 (six) hours as needed for severe pain. 10/15/19   Raylene Everts, MD    Allergies    Patient has no known allergies.  Review of Systems   Review of Systems  Constitutional: Negative for chills and fever.  HENT: Negative for congestion and rhinorrhea.   Respiratory: Negative for cough and shortness of breath.   Cardiovascular: Negative for chest pain  and palpitations.  Gastrointestinal: Negative for diarrhea, nausea and vomiting.  Genitourinary: Negative for difficulty urinating and dysuria.  Musculoskeletal: Negative for arthralgias and back pain.  Skin: Negative for rash and wound.  Neurological: Positive for weakness and headaches. Negative for light-headedness.    Physical Exam Updated Vital Signs BP (!) 152/94   Pulse 65   Temp 98.2 F (36.8 C) (Oral)   Resp 14   Ht 5' 2.5" (1.588 m)   Wt 79.4 kg   SpO2 100%   BMI 31.50 kg/m   Physical Exam Vitals and nursing note reviewed. Exam conducted with a chaperone present.  Constitutional:      General: She is not in acute distress.    Appearance: Normal appearance.  HENT:     Head: Normocephalic and atraumatic.     Nose: No rhinorrhea.  Eyes:     General:        Right eye: No discharge.        Left eye: No discharge.     Conjunctiva/sclera: Conjunctivae normal.     Pupils: Pupils are equal, round, and reactive to light.  Cardiovascular:     Rate and Rhythm: Normal rate and regular rhythm.  Pulmonary:     Effort: Pulmonary effort is normal. No respiratory distress.     Breath sounds: No stridor.  Abdominal:     General: Abdomen is flat. There is no distension.     Palpations: Abdomen is soft.  Musculoskeletal:        General: No tenderness or signs of injury.  Skin:    General: Skin is warm and dry.  Neurological:     General: No focal deficit present.     Mental Status: She is alert. Mental status is at baseline.     Motor: No weakness.     Comments: 5 out of 5 motor strength in all extremities, sensation intact throughout, no dysmetria, no dysdiadochokinesia, no ataxia with ambulation, cranial nerves II through XII intact, alert and oriented to person place and time   Psychiatric:        Mood and Affect: Mood normal.        Behavior: Behavior normal.     ED Results / Procedures / Treatments   Labs (all labs ordered are listed, but only abnormal results  are displayed) Labs Reviewed  CBC - Abnormal; Notable for the following components:      Result Value   Hemoglobin 10.9 (*)    MCH 25.7 (*)    MCHC 29.9 (*)    RDW 18.4 (*)  All other components within normal limits  COMPREHENSIVE METABOLIC PANEL - Abnormal; Notable for the following components:   Glucose, Bld 106 (*)    All other components within normal limits  I-STAT CHEM 8, ED - Abnormal; Notable for the following components:   Glucose, Bld 104 (*)    All other components within normal limits  PROTIME-INR  APTT  DIFFERENTIAL  CBG MONITORING, ED  I-STAT BETA HCG BLOOD, ED ([facility], [facility], [facility] ONLY)    EKG EKG Interpretation  Date/Time:  Tuesday December 31 2019 11:12:49 EDT Ventricular Rate:  66 PR Interval:  146 QRS Duration: 80 QT Interval:  396 QTC Calculation: 415 R Axis:   70 Text Interpretation: Normal sinus rhythm with sinus arrhythmia Normal ECG Confirmed by Dewaine Conger [phone]) on 12/31/2019 11:36:13 AM   Radiology CT HEAD WO CONTRAST  Result Date: 12/31/2019 CLINICAL DATA:  Headache, right-sided weakness EXAM: CT HEAD WITHOUT CONTRAST TECHNIQUE: Contiguous axial images were obtained from the base of the skull through the vertex without intravenous contrast. COMPARISON:  10/13/2016 FINDINGS: Brain: Normal anatomic configuration. No abnormal intra or extra-axial mass lesion or fluid collection. No abnormal mass effect or midline shift. No evidence of acute intracranial hemorrhage or infarct. Ventricular size is normal. Cerebellum unremarkable. Vascular: Unremarkable Skull: Intact Sinuses/Orbits: Paranasal sinuses are clear. Orbits are unremarkable. Other: Mastoid air cells and middle ear cavities are clear. IMPRESSION: No acute intracranial hemorrhage or infarct. Electronically Signed   By: Fidela Salisbury MD   On: 12/31/2019 12:09   MR ANGIO HEAD WO CONTRAST  Result Date: 12/31/2019 CLINICAL DATA:  Initial evaluation for acute right-sided weakness. EXAM: MRI HEAD WITHOUT  CONTRAST MRA HEAD WITHOUT CONTRAST MRA NECK WITHOUT AND WITH CONTRAST TECHNIQUE: Multiplanar, multiecho pulse sequences of the brain and surrounding structures were obtained without intravenous contrast. Angiographic images of the Circle of Willis were obtained using MRA technique without intravenous contrast. Angiographic images of the neck were obtained using MRA technique without and with intravenous contrast. Carotid stenosis measurements (when applicable) are obtained utilizing NASCET criteria, using the distal internal carotid diameter as the denominator. CONTRAST:  6m GADAVIST GADOBUTROL 1 MMOL/ML IV SOLN COMPARISON:  Prior CT from earlier the same day. FINDINGS: MRI HEAD FINDINGS Brain: Cerebral volume within normal limits for patient age. No focal parenchymal signal abnormality identified. No abnormal foci of restricted diffusion to suggest acute or subacute ischemia. Gray-white matter differentiation well maintained. No encephalomalacia to suggest chronic infarction. No evidence for acute intracranial hemorrhage. Single punctate focus of susceptibility artifact noted at the subcortical left frontal parietal region (series 7, image 58), likely a small chronic microhemorrhage, of doubtful significance in isolation. No other evidence for chronic intracranial hemorrhage. No mass lesion, midline shift or mass effect. No hydrocephalus or extra-axial fluid collection. Pituitary gland suprasellar region normal. Incidental note made of a simple 9 mm cyst within the pineal gland. Midline structures intact. Vascular: Major intracranial vascular flow voids well maintained and normal in appearance. Skull and upper cervical spine: Craniocervical junction normal. Visualized upper cervical spine within normal limits. Bone marrow signal intensity normal. No scalp soft tissue abnormality. Sinuses/Orbits: Globes and orbital soft tissues within normal limits. Mild scattered mucosal thickening noted within the ethmoidal air  cells and maxillary sinuses. Paranasal sinuses are otherwise clear. No mastoid effusion. Inner ear structures normal. Other: None. MRA HEAD FINDINGS ANTERIOR CIRCULATION: Visualized distal cervical segments of the internal carotid arteries are widely patent with antegrade flow. Petrous, cavernous, and supraclinoid ICAs widely patent without stenosis or other  abnormality. A1 segments widely patent. Normal anterior communicating artery complex. Anterior cerebral arteries widely patent to their distal aspects. No M1 stenosis or occlusion. Normal MCA bifurcations. Distal MCA branches well perfused and symmetric. POSTERIOR CIRCULATION: Vertebral arteries widely patent to the vertebrobasilar junction without stenosis. Left vertebral artery dominant. Both PICAs patent. Basilar widely patent to its distal aspect without stenosis. Superior cerebral arteries patent bilaterally. Both PCAs primarily supplied via the basilar and are well perfused to their distal aspects. Bilateral posterior communicating arteries noted. No intracranial aneurysm. MRA NECK FINDINGS AORTIC ARCH: Visualized aortic arch of normal caliber with normal branch pattern. No hemodynamically significant stenosis seen about the origin of the great vessels. RIGHT CAROTID SYSTEM: Right common and internal carotid arteries patent without stenosis or evidence for dissection. No significant atheromatous narrowing or irregularity about the right carotid bifurcation. LEFT CAROTID SYSTEM: Left common and internal carotid arteries patent without stenosis or evidence for dissection. No significant atheromatous narrowing or irregularity about the left carotid bifurcation. VERTEBRAL ARTERIES: Both vertebral arteries arise from the subclavian arteries. Vertebral arteries patent without stenosis or evidence for dissection. IMPRESSION: 1. Normal brain MRI. No acute intracranial abnormality identified. 2. Normal intracranial MRA. 3. Normal MRA of the neck. Electronically  Signed   By: Jeannine Boga M.D.   On: 12/31/2019 16:15   MR Angiogram Neck W or Wo Contrast  Result Date: 12/31/2019 CLINICAL DATA:  Initial evaluation for acute right-sided weakness. EXAM: MRI HEAD WITHOUT CONTRAST MRA HEAD WITHOUT CONTRAST MRA NECK WITHOUT AND WITH CONTRAST TECHNIQUE: Multiplanar, multiecho pulse sequences of the brain and surrounding structures were obtained without intravenous contrast. Angiographic images of the Circle of Willis were obtained using MRA technique without intravenous contrast. Angiographic images of the neck were obtained using MRA technique without and with intravenous contrast. Carotid stenosis measurements (when applicable) are obtained utilizing NASCET criteria, using the distal internal carotid diameter as the denominator. CONTRAST:  49m GADAVIST GADOBUTROL 1 MMOL/ML IV SOLN COMPARISON:  Prior CT from earlier the same day. FINDINGS: MRI HEAD FINDINGS Brain: Cerebral volume within normal limits for patient age. No focal parenchymal signal abnormality identified. No abnormal foci of restricted diffusion to suggest acute or subacute ischemia. Gray-white matter differentiation well maintained. No encephalomalacia to suggest chronic infarction. No evidence for acute intracranial hemorrhage. Single punctate focus of susceptibility artifact noted at the subcortical left frontal parietal region (series 7, image 58), likely a small chronic microhemorrhage, of doubtful significance in isolation. No other evidence for chronic intracranial hemorrhage. No mass lesion, midline shift or mass effect. No hydrocephalus or extra-axial fluid collection. Pituitary gland suprasellar region normal. Incidental note made of a simple 9 mm cyst within the pineal gland. Midline structures intact. Vascular: Major intracranial vascular flow voids well maintained and normal in appearance. Skull and upper cervical spine: Craniocervical junction normal. Visualized upper cervical spine within  normal limits. Bone marrow signal intensity normal. No scalp soft tissue abnormality. Sinuses/Orbits: Globes and orbital soft tissues within normal limits. Mild scattered mucosal thickening noted within the ethmoidal air cells and maxillary sinuses. Paranasal sinuses are otherwise clear. No mastoid effusion. Inner ear structures normal. Other: None. MRA HEAD FINDINGS ANTERIOR CIRCULATION: Visualized distal cervical segments of the internal carotid arteries are widely patent with antegrade flow. Petrous, cavernous, and supraclinoid ICAs widely patent without stenosis or other abnormality. A1 segments widely patent. Normal anterior communicating artery complex. Anterior cerebral arteries widely patent to their distal aspects. No M1 stenosis or occlusion. Normal MCA bifurcations. Distal MCA branches well perfused  and symmetric. POSTERIOR CIRCULATION: Vertebral arteries widely patent to the vertebrobasilar junction without stenosis. Left vertebral artery dominant. Both PICAs patent. Basilar widely patent to its distal aspect without stenosis. Superior cerebral arteries patent bilaterally. Both PCAs primarily supplied via the basilar and are well perfused to their distal aspects. Bilateral posterior communicating arteries noted. No intracranial aneurysm. MRA NECK FINDINGS AORTIC ARCH: Visualized aortic arch of normal caliber with normal branch pattern. No hemodynamically significant stenosis seen about the origin of the great vessels. RIGHT CAROTID SYSTEM: Right common and internal carotid arteries patent without stenosis or evidence for dissection. No significant atheromatous narrowing or irregularity about the right carotid bifurcation. LEFT CAROTID SYSTEM: Left common and internal carotid arteries patent without stenosis or evidence for dissection. No significant atheromatous narrowing or irregularity about the left carotid bifurcation. VERTEBRAL ARTERIES: Both vertebral arteries arise from the subclavian arteries.  Vertebral arteries patent without stenosis or evidence for dissection. IMPRESSION: 1. Normal brain MRI. No acute intracranial abnormality identified. 2. Normal intracranial MRA. 3. Normal MRA of the neck. Electronically Signed   By: Jeannine Boga M.D.   On: 12/31/2019 16:15   MR BRAIN WO CONTRAST  Result Date: 12/31/2019 CLINICAL DATA:  Initial evaluation for acute right-sided weakness. EXAM: MRI HEAD WITHOUT CONTRAST MRA HEAD WITHOUT CONTRAST MRA NECK WITHOUT AND WITH CONTRAST TECHNIQUE: Multiplanar, multiecho pulse sequences of the brain and surrounding structures were obtained without intravenous contrast. Angiographic images of the Circle of Willis were obtained using MRA technique without intravenous contrast. Angiographic images of the neck were obtained using MRA technique without and with intravenous contrast. Carotid stenosis measurements (when applicable) are obtained utilizing NASCET criteria, using the distal internal carotid diameter as the denominator. CONTRAST:  51m GADAVIST GADOBUTROL 1 MMOL/ML IV SOLN COMPARISON:  Prior CT from earlier the same day. FINDINGS: MRI HEAD FINDINGS Brain: Cerebral volume within normal limits for patient age. No focal parenchymal signal abnormality identified. No abnormal foci of restricted diffusion to suggest acute or subacute ischemia. Gray-white matter differentiation well maintained. No encephalomalacia to suggest chronic infarction. No evidence for acute intracranial hemorrhage. Single punctate focus of susceptibility artifact noted at the subcortical left frontal parietal region (series 7, image 58), likely a small chronic microhemorrhage, of doubtful significance in isolation. No other evidence for chronic intracranial hemorrhage. No mass lesion, midline shift or mass effect. No hydrocephalus or extra-axial fluid collection. Pituitary gland suprasellar region normal. Incidental note made of a simple 9 mm cyst within the pineal gland. Midline structures  intact. Vascular: Major intracranial vascular flow voids well maintained and normal in appearance. Skull and upper cervical spine: Craniocervical junction normal. Visualized upper cervical spine within normal limits. Bone marrow signal intensity normal. No scalp soft tissue abnormality. Sinuses/Orbits: Globes and orbital soft tissues within normal limits. Mild scattered mucosal thickening noted within the ethmoidal air cells and maxillary sinuses. Paranasal sinuses are otherwise clear. No mastoid effusion. Inner ear structures normal. Other: None. MRA HEAD FINDINGS ANTERIOR CIRCULATION: Visualized distal cervical segments of the internal carotid arteries are widely patent with antegrade flow. Petrous, cavernous, and supraclinoid ICAs widely patent without stenosis or other abnormality. A1 segments widely patent. Normal anterior communicating artery complex. Anterior cerebral arteries widely patent to their distal aspects. No M1 stenosis or occlusion. Normal MCA bifurcations. Distal MCA branches well perfused and symmetric. POSTERIOR CIRCULATION: Vertebral arteries widely patent to the vertebrobasilar junction without stenosis. Left vertebral artery dominant. Both PICAs patent. Basilar widely patent to its distal aspect without stenosis. Superior cerebral arteries patent bilaterally.  Both PCAs primarily supplied via the basilar and are well perfused to their distal aspects. Bilateral posterior communicating arteries noted. No intracranial aneurysm. MRA NECK FINDINGS AORTIC ARCH: Visualized aortic arch of normal caliber with normal branch pattern. No hemodynamically significant stenosis seen about the origin of the great vessels. RIGHT CAROTID SYSTEM: Right common and internal carotid arteries patent without stenosis or evidence for dissection. No significant atheromatous narrowing or irregularity about the right carotid bifurcation. LEFT CAROTID SYSTEM: Left common and internal carotid arteries patent without  stenosis or evidence for dissection. No significant atheromatous narrowing or irregularity about the left carotid bifurcation. VERTEBRAL ARTERIES: Both vertebral arteries arise from the subclavian arteries. Vertebral arteries patent without stenosis or evidence for dissection. IMPRESSION: 1. Normal brain MRI. No acute intracranial abnormality identified. 2. Normal intracranial MRA. 3. Normal MRA of the neck. Electronically Signed   By: Jeannine Boga M.D.   On: 12/31/2019 16:15    Procedures Procedures (including critical care time)  Medications Ordered in ED Medications  sodium chloride flush (NS) 0.9 % injection 3 mL (3 mLs Intravenous Not Given 12/31/19 1232)  metoCLOPramide (REGLAN) injection 10 mg (10 mg Intravenous Given 12/31/19 1226)  diphenhydrAMINE (BENADRYL) injection 25 mg (25 mg Intravenous Given 12/31/19 1225)  sodium chloride 0.9 % bolus 1,000 mL (0 mLs Intravenous Stopped 12/31/19 1423)  LORazepam (ATIVAN) tablet 2 mg (2 mg Sublingual Given 12/31/19 1434)  gadobutrol (GADAVIST) 1 MMOL/ML injection 8 mL (8 mLs Intravenous Contrast Given 12/31/19 1601)    ED Course  I have reviewed the triage vital signs and the nursing notes.  Pertinent labs & imaging results that were available during my care of the patient were reviewed by me and considered in my medical decision making (see chart for details).    MDM Rules/Calculators/A&P                          Strokelike symptoms started with a right-sided headache early this morning, last known normal 1 AM, approximately 10 hours ago.  Subjective weakness on the right objectively she has equal strength, sensation intact no neurologic deficits.  Her blood pressure has been high at home with diastolics in the 580D and 1 teens.  Here she is 160 over the low 100s.  She is resting comfortably.  Migraine cocktail be given as this could be complex migraine, stroke work-up to include noncontrasted CT scan lab studies and likely  consultation with neurology.  No infectious signs and symptoms no trauma.  CT imaging is reviewed by myself and radiology with no acute abnormality no signs of ischemia. Laboratory studies are also unremarkable for any acute derangement that would cause patient's symptoms. I spoke with the neurologist on-call and they agree treatment for complex migraine with MRI would be an adequate work-up to differentiate stroke versus complex migraine. MRI and MRA of the head neck are performed and showed no significant findings after radiology review. I reviewed these findings as well. Medication to include IV fluids Reglan and Benadryl are given for migraine therapy. Patient has significant relief of headache symptoms and resolution of weakness of the extremities. She feels comfortable now with discharge home. Outpatient neurology follow-up is provided for management of headache. Strict return precautions are provided she is invited to come back anytime for reevaluation  Final Clinical Impression(s) / ED Diagnoses Final diagnoses:  Acute nonintractable headache, unspecified headache type  Right sided weakness    Rx / DC Orders ED Discharge Orders  None       Breck Coons, MD 12/31/19 1836

## 2019-12-31 NOTE — Telephone Encounter (Signed)
Pt. Reports she has had a migraine x 3 days. Is at work and is having right sided weakness."I'm having a hard time using my right arm and leg." States her boss will call 911.  Reason for Disposition . Sounds like a life-threatening emergency to the triager  Answer Assessment - Initial Assessment Questions 1. SYMPTOM: "What is the main symptom you are concerned about?" (e.g., weakness, numbness)     Right sided weakness 2. ONSET: "When did this start?" (minutes, hours, days; while sleeping)     This morning 3. LAST NORMAL: "When was the last time you were normal (no symptoms)?"     3 days 4. PATTERN "Does this come and go, or has it been constant since it started?"  "Is it present now?"     Constant 5. CARDIAC SYMPTOMS: "Have you had any of the following symptoms: chest pain, difficulty breathing, palpitations?"     No 6. NEUROLOGIC SYMPTOMS: "Have you had any of the following symptoms: headache, dizziness, vision loss, double vision, changes in speech, unsteady on your feet?"     Migraine 7. OTHER SYMPTOMS: "Do you have any other symptoms?"     No 8. PREGNANCY: "Is there any chance you are pregnant?" "When was your last menstrual period?"     No  Protocols used: NEUROLOGIC DEFICIT-A-AH

## 2019-12-31 NOTE — ED Notes (Signed)
Patient returned to room. 

## 2019-12-31 NOTE — ED Notes (Signed)
Patient verbalizes understanding of discharge instructions. Opportunity for questioning and answers were provided. Armband removed by staff, pt discharged from ED ambulatory.   

## 2019-12-31 NOTE — ED Triage Notes (Signed)
Pt arrives to triage via [facility] with complaints of headache and right sided weakness. Per EMS patients LKN was at 1am when she fell asleep with a headache. Pt woke up at 7am this morning and noticed some weakness to the right arm and leg. 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

## 2019-12-31 NOTE — Telephone Encounter (Signed)
Reviewed ED note no acute findings

## 2020-01-24 ENCOUNTER — Other Ambulatory Visit (INDEPENDENT_AMBULATORY_CARE_PROVIDER_SITE_OTHER): Payer: Self-pay | Admitting: Primary Care

## 2020-01-24 MED FILL — HYDROCHLOROTHIAZIDE 25 MG T: 25 | 90 days supply | Qty: 90 | Fill #0

## 2020-01-24 MED FILL — BACLOFEN 10 MG TABLET: 10 | 10 days supply | Qty: 30 | Fill #1

## 2020-01-24 MED FILL — IBUPROFEN 800 MG TABLET: 800 | 20 days supply | Qty: 60 | Fill #0

## 2020-01-24 MED FILL — AMLODIPINE BESYLATE 10 MG T: 10 | 90 days supply | Qty: 90 | Fill #0

## 2020-01-24 MED FILL — OMEPRAZOLE 20 MG CAP: 20 | 30 days supply | Qty: 30 | Fill #0

## 2020-01-29 ENCOUNTER — Ambulatory Visit (HOSPITAL_COMMUNITY)
Admission: EM | Admit: 2020-01-29 | Discharge: 2020-01-29 | Disposition: A | Discharge status: Home / Self Care | Payer: Medicaid Other | Attending: Emergency Medicine | Admitting: Emergency Medicine

## 2020-01-29 ENCOUNTER — Other Ambulatory Visit: Payer: Self-pay

## 2020-01-29 ENCOUNTER — Encounter (HOSPITAL_COMMUNITY): Payer: Self-pay

## 2020-01-29 ENCOUNTER — Ambulatory Visit (HOSPITAL_COMMUNITY)
Admission: EM | Admit: 2020-01-29 | Discharge: 2020-01-29 | Disposition: A | Discharge status: Still In Hospital | Payer: Medicaid Other

## 2020-01-29 ENCOUNTER — Other Ambulatory Visit (HOSPITAL_COMMUNITY): Payer: Self-pay | Admitting: Emergency Medicine

## 2020-01-29 DIAGNOSIS — M5441 Lumbago with sciatica, right side: Secondary | ICD-10-CM

## 2020-01-29 DIAGNOSIS — M5442 Lumbago with sciatica, left side: Secondary | ICD-10-CM

## 2020-01-29 MED ORDER — METHYLPREDNISOLONE 4 MG PO TBPK: ORAL_TABLET | Freq: Every day | ORAL | 0 refills | Status: DC

## 2020-01-29 MED ORDER — ACETAMINOPHEN 325 MG PO TABS
ORAL_TABLET | ORAL | Status: AC
  Filled 2020-01-29: qty 3

## 2020-01-29 MED ORDER — KETOROLAC TROMETHAMINE 30 MG/ML IJ SOLN
30.0000 mg | Freq: Once | INTRAMUSCULAR | Status: AC
  Administered 2020-01-29: 30 mg via INTRAMUSCULAR

## 2020-01-29 MED ORDER — MELOXICAM 15 MG PO TABS: 15.0000 mg | ORAL_TABLET | Freq: Every day | ORAL | 0 refills | Status: DC

## 2020-01-29 MED ORDER — ACETAMINOPHEN 325 MG PO TABS
975.0000 mg | ORAL_TABLET | Freq: Once | ORAL | Status: AC
  Administered 2020-01-29: 975 mg via ORAL

## 2020-01-29 MED ORDER — KETOROLAC TROMETHAMINE 30 MG/ML IJ SOLN
INTRAMUSCULAR | Status: AC
  Filled 2020-01-29: qty 1

## 2020-01-29 NOTE — Discharge Instructions (Addendum)
Finish the steroid taper unless somebody tells you to stop.  Discontinue ibuprofen.  Take the Mobic once a day with food.  May take 1000 mg of Tylenol with it.  May take a additional 1000 mg of Tylenol 2 or 3 more times in a day.  Do not exceed more than 4000 mg of Tylenol from all sources in 1 day.  Follow-up with your orthopedic surgeon as soon as you possibly can or with current sports medicine.

## 2020-01-29 NOTE — ED Provider Notes (Signed)
HPI  SUBJECTIVE:  Gina Shelton is a 44 y.o. female who presents with 5 days of low back pain radiating to her pelvis and bilateral legs.  She states that her feet went numb last night on the left more than the right.  She reports weakness secondary to pain, denies true weakness.  She describes the pain is like a cramp, spasm and as a gout flare "all-in-one".  She states the pain is constant, sharp with movement.  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  No nausea, vomiting, fevers, direct trauma to the back.  No urinary or fecal incontinence.  No saddle anesthesia, urinary retention.  No urinary complaints, pain worse at night, night sweats, unintentional weight loss.  She had a bowel movement yesterday without a change in her pain.  She denies knee swelling, increased temperature, hip or knee pain.  No distal lower extremity swelling.  No vaginal complaints.  She is in a long-term monogamous relationship with a female who is asymptomatic.  STDs are not a concern today.  She states this is different than her previous low back pain.  She has a past medical history of polysubstance abuse states that she has been clean since 2014.  Denies IVDU. She has a history of ovarian cyst, alcohol dependence, hypertension, COPD, gout in both of her knees, sacroiliitis, chronic low back pain, she is status post C-section and bilateral tubal ligation, she has a past medical history of fibroids.  No history of nephrolithiasis, pyelonephritis, osteoporosis, prolonged steroid use, cancer, multiple myeloma.  She tried ibuprofen 800 mg twice a day and Zanaflex 2 to 4 mg every 6 hours without improvement in her symptoms.  Symptoms are worse with standing up, bending forward.  She took ibuprofen within 6 hours of evaluation.  LMP: 11/9.  Denies possibility of being pregnant.  PPI:RJJOACZ, Milford Cage, NP    Past Medical History:  Diagnosis Date  . Anxiety   .  Depression   . Depression   . GERD (gastroesophageal reflux disease)   . Gout    ble  . Migraine   . Migraine     Past Surgical History:  Procedure Laterality Date  . CESAREAN SECTION  infection at incission requiring return to OR x 2  . TUBAL LIGATION      Family History  Problem Relation Age of Onset  . Hypertension Mother   . Arthritis Sister   . Anesthesia problems Neg Hx   . Hypotension Neg Hx   . Malignant hyperthermia Neg Hx   . Pseudochol deficiency Neg Hx     Social History   Tobacco Use  . Smoking status: Current Every Day Smoker    Packs/day: 1.00    Years: 0.00    Pack years: 0.00    Types: Cigarettes  . Smokeless tobacco: Never Used  Vaping Use  . Vaping Use: Never used  Substance Use Topics  . Alcohol use: Yes    Comment: Consumes several beers every other day per patient stated  . Drug use: No     Current Facility-Administered Medications:  .  acetaminophen (TYLENOL) tablet 975 mg, 975 mg, Oral, Once, Melynda Ripple, MD .  ketorolac (TORADOL) 30 MG/ML injection 30 mg, 30 mg, Intramuscular, Once, Melynda Ripple, MD  Current Outpatient Medications:  .  omeprazole (PRILOSEC) 20 MG capsule, TAKE 1 CAPSULE (20 MG TOTAL) BY MOUTH DAILY., Disp: 30 capsule, Rfl: 0 .  amLODipine (NORVASC)  10 MG tablet, Take 1 tablet (10 mg total) by mouth daily., Disp: 90 tablet, Rfl: 1 .  baclofen (LIORESAL) 10 MG tablet, Take 1 tablet (10 mg total) by mouth 3 (three) times daily as needed for muscle spasms., Disp: 30 each, Rfl: 3 .  Blood Pressure Monitor KIT, 1 kit by Does not apply route 3 (three) times daily as needed., Disp: 1 kit, Rfl: 0 .  hydrochlorothiazide (HYDRODIURIL) 25 MG tablet, Take 1 tablet (25 mg total) by mouth daily., Disp: 90 tablet, Rfl: 1 .  meloxicam (MOBIC) 15 MG tablet, Take 1 tablet (15 mg total) by mouth daily for 7 days., Disp: 7 tablet, Rfl: 0 .  methylPREDNISolone (MEDROL DOSEPAK) 4 MG TBPK tablet, Take by mouth daily. Follow package  instructions, Disp: 21 tablet, Rfl: 0  No Known Allergies   ROS  As noted in HPI.   Physical Exam  BP 107/73 (BP Location: Left Arm)   Pulse 95   Temp 98.9 F (37.2 C) (Oral)   Resp 17   LMP 01/21/2020 (Exact Date)   SpO2 100%   Constitutional: Well developed, well nourished, appears uncomfortable.  Eyes:  EOMI, conjunctiva normal bilaterally HENT: Normocephalic, atraumatic,mucus membranes moist Respiratory: Normal inspiratory effort Cardiovascular: Normal rate GI: nondistended. No suprapubic, flank tenderness skin: No rash, skin intact Musculoskeletal: no CVAT. - paralumbar tenderness, bilateral QLTenderness, - muscle spasm.  Tenderness at L5/S1.  Bilateral lower extremities nontender,   intact DP pulses, CR<2 secs.   pain with int/ext rotation flex/extension hips bilaterally.  Has difficulty tolerating full left hip exam.  SLR positive bilaterally. Sensation baseline light touch bilaterally for Pt, DTR's symmetric and intact bilaterally KJ, Motor 4/5 hip flexion on the left due to pain, 5/5 and equal bilaterally quadriceps, hamstrings, EHL, foot dorsiflexion, foot plantarflexion, gait  antalgic but without apparent new ataxia. Neurologic: Alert & oriented x 3, no focal neuro deficits Psychiatric: Speech and behavior appropriate   ED Course   Medications  ketorolac (TORADOL) 30 MG/ML injection 30 mg (has no administration in time range)  acetaminophen (TYLENOL) tablet 975 mg (has no administration in time range)    No orders of the defined types were placed in this encounter.   No results found for this or any previous visit (from the past 24 hour(s)). No results found.  ED Clinical Impression  1. Acute midline low back pain with bilateral sciatica     ED Assessment/Plan   Giving Toradol 30 mg IM, Tylenol 975 mg p.o.  Discussed doing plain films with pt, however I feel that they would be low yield in the absence of osteoporosis, malignancy, trauma, or advanced  age.  Doubt infection, cauda equina syndrome or other emergency.  However I believe that she needs an MRI.  She is okay with deferring plain films today and trying steroid taper, Mobic, Tylenol, discontinue ibuprofen.  Has been seen by orthopedics previously for this and has gone through physical therapy with improvement in her symptoms.  States that she did not get the MRI because she got better with physical therapy.  She states that she can follow-up with her orthopedic surgeon ASAP to get an MRI arranged, however will also give her information for [facility] sports [facility].  Gave her strict ER return precautions.  Discussed medical decision-making, and plan for follow-up with the patient.  Discussed signs and symptoms that should prompt return to the emergency department.  Patient agrees with plan.   Meds ordered this encounter  Medications  . ketorolac (TORADOL)  30 MG/ML injection 30 mg  . acetaminophen (TYLENOL) tablet 975 mg  . methylPREDNISolone (MEDROL DOSEPAK) 4 MG TBPK tablet    Sig: Take by mouth daily. Follow package instructions    Dispense:  21 tablet    Refill:  0  . meloxicam (MOBIC) 15 MG tablet    Sig: Take 1 tablet (15 mg total) by mouth daily for 7 days.    Dispense:  7 tablet    Refill:  0    *This clinic note was created using [employer]. Therefore, there may be occasional mistakes despite careful proofreading.  ?     Melynda Ripple, MD 01/30/20 [phone]

## 2020-01-29 NOTE — ED Triage Notes (Signed)
Pt is c/o lower abdominal pain that radiates to her legs and feet x 5 days. She states that any type of movement make the pain worse. Pt states keeping her legs bent helps relieve some of the pain. She sates she has taken Ibuprofen and it did not relieve any pain. She states she took muscle relaxers that she used for her back pain but it did not relieve the pain. Pt states she is unaware of what is causing the pain. She states that when she takes a deep breath she feels a sharp pain.

## 2020-01-30 MED FILL — MELOXICAM 15 MG TABLET: 15 | 7 days supply | Qty: 7 | Fill #0

## 2020-02-03 ENCOUNTER — Other Ambulatory Visit: Payer: Medicaid Other

## 2020-02-03 MED FILL — AMLODIPINE BESYLATE 10 MG T: 10 | 90 days supply | Qty: 90 | Fill #0

## 2020-02-03 MED FILL — IBUPROFEN 800 MG TABLET: 800 | 20 days supply | Qty: 60 | Fill #0

## 2020-02-03 MED FILL — HYDROCHLOROTHIAZIDE 25 MG T: 25 | 90 days supply | Qty: 90 | Fill #0

## 2020-02-03 MED FILL — OMEPRAZOLE 20 MG CAP: 20 | 30 days supply | Qty: 30 | Fill #0

## 2020-02-28 ENCOUNTER — Encounter (INDEPENDENT_AMBULATORY_CARE_PROVIDER_SITE_OTHER): Payer: Self-pay | Admitting: Primary Care

## 2020-02-28 ENCOUNTER — Other Ambulatory Visit: Payer: Self-pay

## 2020-02-28 ENCOUNTER — Ambulatory Visit (INDEPENDENT_AMBULATORY_CARE_PROVIDER_SITE_OTHER): Payer: Medicaid Other | Admitting: Primary Care

## 2020-02-28 ENCOUNTER — Ambulatory Visit (INDEPENDENT_AMBULATORY_CARE_PROVIDER_SITE_OTHER): Payer: Self-pay | Admitting: *Deleted

## 2020-02-28 ENCOUNTER — Other Ambulatory Visit (INDEPENDENT_AMBULATORY_CARE_PROVIDER_SITE_OTHER): Payer: Self-pay | Admitting: Primary Care

## 2020-02-28 DIAGNOSIS — J018 Other acute sinusitis: Secondary | ICD-10-CM | POA: Diagnosis not present

## 2020-02-28 MED ORDER — AZITHROMYCIN 250 MG PO TABS: ORAL_TABLET | ORAL | 0 refills | Status: DC

## 2020-02-28 MED ORDER — FLUCONAZOLE 150 MG PO TABS: 150.0000 mg | ORAL_TABLET | Freq: Once | ORAL | 0 refills | Status: DC

## 2020-02-28 MED ORDER — ALBUTEROL SULFATE HFA 108 (90 BASE) MCG/ACT IN AERS: 2.0000 | INHALATION_SPRAY | Freq: Four times a day (QID) | RESPIRATORY_TRACT | 1 refills | Status: DC | PRN

## 2020-02-28 MED ORDER — FLUTICASONE PROPIONATE 50 MCG/ACT NA SUSP: 2.0000 | Freq: Every day | NASAL | 6 refills | Status: DC

## 2020-02-28 MED FILL — FLUTICASONE PROP 50 MCG SPR: 50 | 30 days supply | Qty: 16 | Fill #0

## 2020-02-28 MED FILL — PROAIR HFA 90 MCG INHALER: 108 (90 BAS | 25 days supply | Qty: 9 | Fill #0

## 2020-02-28 MED FILL — AZITHROMYCIN 250 MG TABLET: 250 | 5 days supply | Qty: 6 | Fill #0

## 2020-02-28 MED FILL — FLUCONAZOLE 150 MG TABLET: 150 | 1 days supply | Qty: 1 | Fill #0

## 2020-02-28 NOTE — Progress Notes (Signed)
Pt believes she has a sinus infection

## 2020-02-28 NOTE — Telephone Encounter (Signed)
Patient is calling to report she has chronic cough for months and COPD- but her symptoms are worse and she has pain in ear and neck.Patient states she has SOB with her coughing spells and has started to have chest pain with coughing. Patient states she thinks she may have fever- she is at work now. Patient has appointment today- advised her to keep appointment and will go from there.  Reason for Disposition . [1] Continuous (nonstop) coughing interferes with work or school AND [2] no improvement using cough treatment per Care Advice  Answer Assessment - Initial Assessment Questions 1. RESPIRATORY STATUS: "Describe your breathing?" (e.g., wheezing, shortness of breath, unable to speak, severe coughing)      Cough is causing SOB- R side of face and next is hurting, feels warm- unable to take temperature 2. ONSET: "When did this breathing problem begin?"      3-4 months 3. PATTERN "Does the difficult breathing come and go, or has it been constant since it started?"      Comes and goes with cough and at night 4. SEVERITY: "How bad is your breathing?" (e.g., mild, moderate, severe)    - MILD: No SOB at rest, mild SOB with walking, speaks normally in sentences, can lay down, no retractions, pulse < 100.    - MODERATE: SOB at rest, SOB with minimal exertion and prefers to sit, cannot lie down flat, speaks in phrases, mild retractions, audible wheezing, pulse 100-120.    - SEVERE: Very SOB at rest, speaks in single words, struggling to breathe, sitting hunched forward, retractions, pulse > 120      Mild- patient has COPD- she is out of medictaion 5. RECURRENT SYMPTOM: "Have you had difficulty breathing before?" If Yes, ask: "When was the last time?" and "What happened that time?"      COPD- is out of medication 6. CARDIAC HISTORY: "Do you have any history of heart disease?" (e.g., heart attack, angina, bypass surgery, angioplasty)      no 7. LUNG HISTORY: "Do you have any history of lung disease?"   (e.g., pulmonary embolus, asthma, emphysema)     COPD 8. CAUSE: "What do you think is causing the breathing problem?"      Allergies-worse- sinus infection 9. OTHER SYMPTOMS: "Do you have any other symptoms? (e.g., dizziness, runny nose, cough, chest pain, fever)     Congestion, eye watery/puffy, ear pain, headache 10. PREGNANCY: "Is there any chance you are pregnant?" "When was your last menstrual period?"       No- LMP- 02/09/20 11. TRAVEL: "Have you traveled out of the country in the last month?" (e.g., travel history, exposures)       no  Protocols used: COUGH - ACUTE PRODUCTIVE-A-AH, BREATHING DIFFICULTY-A-AH

## 2020-02-28 NOTE — Progress Notes (Signed)
Telephone Note  I connected with Gina Shelton on 02/28/20 at 10:50 AM EST by telephone and verified that I am speaking with the correct person using two identifiers. Right side cervical/ ha  Location: Patient: home Provider: [name] @[facility]    I discussed the limitations, risks, security and privacy concerns of performing an evaluation and management service by telephone and the availability of in person appointments. I also discussed with the patient that there may be a patient responsible charge related to this service. The patient expressed understanding and agreed to proceed.   History of Present Illness: Ms. Gina Shelton is a 44 year old female    Observations/Objective:   Assessment and Plan: Gina Shelton was seen today for cough.  Diagnoses and all orders for this visit:  Other acute sinusitis, recurrence not specified -     azithromycin (ZITHROMAX Z-PAK) 250 MG tablet; Take 2 pills day 1 Day 2 through Day day 5 take one pill until gone -     fluticasone (FLONASE) 50 MCG/ACT nasal spray; Place 2 sprays into both nostrils daily. -     fluconazole (DIFLUCAN) 150 MG tablet; Take 1 tablet (150 mg total) by mouth once for 1 dose.  Other orders -     albuterol (VENTOLIN HFA) 108 (90 Base) MCG/ACT inhaler; Inhale 2 puffs into the lungs every 6 (six) hours as needed for wheezing or shortness of breath.   -Please take Tylenol or Ibuprofen for pain. -Acetaminiphen 325mg  orally every 4-6 hours for pain.  Max: 10 per day -Ibuprofen 200mg  orally every 6-8 hours for pain.  Take with food to avoid ulcers.   Max 10 per day -Try steam showers to open your nasal passages.   Drink lots of water to stay hydrated and to thin mucous.  Flonase/Nasonex is to help the inflammation.  Take 2 sprays in each nostril at bedtime.  Make sure you spray towards the outside of each nostril towards the outer corner of your eye, hold nose close and tilt head back.  This will help the medication  get into your sinuses. -It can take up to 2 weeks to feel better.  Sinusitis is mostly caused by viruses.  -If you do not get better in 7-10 days (Have fever, facial pain, dental pain and swelling), then please call the office and I can send in an antibiotic.  Follow Up Instructions:    I discussed the assessment and treatment plan with the patient. The patient was provided an opportunity to ask questions and all were answered. The patient agreed with the plan and demonstrated an understanding of the instructions.   The patient was advised to call back or seek an in-person evaluation if the symptoms worsen or if the condition fails to improve as anticipated.  I provided 15 minutes of non-face-to-face time during this encounter.   [name], NP

## 2020-03-23 ENCOUNTER — Other Ambulatory Visit: Payer: Medicaid Other

## 2020-03-23 ENCOUNTER — Other Ambulatory Visit: Payer: Self-pay

## 2020-03-23 DIAGNOSIS — Z20822 Contact with and (suspected) exposure to covid-19: Secondary | ICD-10-CM

## 2020-03-25 LAB — NOVEL CORONAVIRUS, NAA: SARS-CoV-2, NAA: DETECTED — AB

## 2020-03-25 LAB — SARS-COV-2, NAA 2 DAY TAT

## 2020-03-26 ENCOUNTER — Telehealth: Payer: Self-pay | Admitting: Adult Health

## 2020-03-26 NOTE — Telephone Encounter (Signed)
Called patient regarding treatment for COVID 19 given to those who are at risk for complications and/or hospitalization of the virus.  Gina Shelton is feeling better and her most recent test was negative.  She is coming off of isolation tomorrow.  I wished her continued health.     Wilber Bihari, NP

## 2020-06-13 ENCOUNTER — Other Ambulatory Visit: Payer: Self-pay

## 2020-07-30 ENCOUNTER — Encounter (HOSPITAL_COMMUNITY): Payer: Self-pay

## 2020-07-30 ENCOUNTER — Other Ambulatory Visit: Payer: Self-pay

## 2020-07-30 ENCOUNTER — Ambulatory Visit (HOSPITAL_COMMUNITY)
Admission: EM | Admit: 2020-07-30 | Discharge: 2020-07-30 | Disposition: A | Discharge status: Home / Self Care | Payer: Medicaid Other | Attending: Emergency Medicine | Admitting: Emergency Medicine

## 2020-07-30 DIAGNOSIS — H00012 Hordeolum externum right lower eyelid: Secondary | ICD-10-CM

## 2020-07-30 MED ORDER — ERYTHROMYCIN 5 MG/GM OP OINT: TOPICAL_OINTMENT | OPHTHALMIC | 0 refills | Status: DC

## 2020-07-30 NOTE — ED Triage Notes (Signed)
Pt reports pain and swelling in the right eye x 1 day. States she had a white bump with clear drainage.

## 2020-07-30 NOTE — Discharge Instructions (Signed)
Apply the erythomycin ointment 4 times a day for the next 5-7 days, or until symptoms have resolved.    Apply a warm compress to your eye as needed for comfort.   Perform lid washing: Either warm water or very dilute baby shampoo can be placed on a clean wash cloth, gauze pad, or cotton swab. Then be advised to gently clean along the lashes and lid margin to remove the accumulated material with care to avoid contacting the ocular surface. If shampoo is used, thorough rinsing is recommended. Vigorous washing should be avoided, as it may cause more irritation.   Follow up with ophthalmology for further evaluation and management if symptoms persists Return or go to ER if you have any new or worsening symptoms such as fever, chills, redness, swelling, eye pain, painful eye movements, vision changes.

## 2020-07-30 NOTE — ED Provider Notes (Signed)
[place]    CSN: [id number] Arrival date & time: 07/30/20  5110      History   Chief Complaint Chief Complaint  Patient presents with  . Eye Problem    HPI Gina Shelton is a 45 y.o. female.   Here for evaluation of right eye pain and swelling that has been ongoing for the past several days.  Reports noticed a "white bump" on right lower lid yesterday and that it then began to drain last night.  Reports drainage was clear.  Reports taking ibuprofen with some pain relief.  Denies any vision changes.  Denies any trauma, injury, or other precipitating event.  Denies any specific alleviating or aggravating factors.  Denies any fevers, chest pain, shortness of breath, N/V/D, numbness, tingling, weakness, abdominal pain, or headaches.      Eye Problem Associated symptoms: discharge   Associated symptoms: no photophobia     Past Medical History:  Diagnosis Date  . Anxiety   . Depression   . Depression   . GERD (gastroesophageal reflux disease)   . Gout    ble  . Migraine   . Migraine     Patient Active Problem List   Diagnosis Date Noted  . COPD exacerbation ([place]) 09/10/2019  . Chest pain 09/27/2017  . Polysubstance abuse ([place]) 09/27/2017  . Hypertensive urgency 09/26/2017  . Obesity (BMI 30.0-34.9) 05/22/2017  . Ovarian cyst 05/22/2017  . Cavernous hemangioma of liver 05/22/2017  . Major depressive disorder, single episode, severe without psychosis ([place]) 12/31/2015  . 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 Alcohol dependence ([place]) 06/07/2013  . Cocaine abuse ([place]) 06/07/2013  . DEPRESSION, MAJOR, RECURRENT 08/17/2007  . ANXIETY STATE NOS 11/15/2006    Past Surgical History:  Procedure Laterality Date  . CESAREAN SECTION  infection at incission requiring return to OR x 2  . TUBAL LIGATION      OB History    Gravida  3   Para  3   Term  0   Preterm  0   AB  0   Living        SAB   0   IAB  0   Ectopic  0   Multiple      Live Births               Home Medications    Prior to Admission medications   Medication Sig Start Date End Date Taking? Authorizing Provider  erythromycin ophthalmic ointment Place a 1/2 inch ribbon of ointment into the lower eyelid. 07/30/20  Yes Pearson Forster, NP  albuterol (VENTOLIN HFA) 108 (90 Base) MCG/ACT inhaler INHALE 2 PUFFS INTO THE LUNGS EVERY 6 (SIX) HOURS AS NEEDED FOR WHEEZING OR SHORTNESS OF BREATH. 02/28/20 02/27/21  [name], NP  amLODipine (NORVASC) 10 MG tablet TAKE 1 TABLET (10 MG TOTAL) BY MOUTH DAILY. 09/10/19 09/09/20  [name], NP  azithromycin (ZITHROMAX) 250 MG tablet TAKE 2 TABLETS BY MOUTH ON DAY 1 THEN TAKE 1 TABLET DAILY FOR THE NEXT 4 DAYS Patient taking differently: Take by mouth daily. for the next 4 days 02/28/20 02/27/21  [name], NP  baclofen (LIORESAL) 10 MG tablet Take 1 tablet (10 mg total) by mouth 3 (three) times daily as needed for muscle spasms. 09/23/19   Hilts, Michael, MD  Blood Pressure Monitor KIT 1 kit by Does not apply route 3 (three) times daily as needed. 04/04/19   Oletta Lamas,  Milford Cage, NP  fluticasone (FLONASE) 50 MCG/ACT nasal spray PLACE 2 SPRAYS INTO BOTH NOSTRILS DAILY. 02/28/20 02/27/21  [name], NP  hydrochlorothiazide (HYDRODIURIL) 25 MG tablet TAKE 1 TABLET (25 MG TOTAL) BY MOUTH DAILY. 09/10/19 09/09/20  [name], NP  ibuprofen (ADVIL) 800 MG tablet TAKE 1 TABLET (800 MG TOTAL) BY MOUTH EVERY 8 (EIGHT) HOURS AS NEEDED. 01/24/20 01/23/21  [name], NP  meloxicam (MOBIC) 15 MG tablet TAKE 1 TABLET (15 MG TOTAL) BY MOUTH DAILY FOR 7 DAYS. 01/29/20 01/28/21  Melynda Ripple, MD  omeprazole (PRILOSEC) 20 MG capsule TAKE 1 CAPSULE (20 MG TOTAL) BY MOUTH DAILY. 01/24/20 01/23/21  [name], NP    Family History Family History  Problem Relation Age of Onset  . Hypertension Mother   . Arthritis Sister   .  Anesthesia problems Neg Hx   . Hypotension Neg Hx   . Malignant hyperthermia Neg Hx   . Pseudochol deficiency Neg Hx     Social History Social History   Tobacco Use  . Smoking status: Current Every Day Smoker    Packs/day: 1.00    Years: 0.00    Pack years: 0.00    Types: Cigarettes  . Smokeless tobacco: Never Used  Vaping Use  . Vaping Use: Never used  Substance Use Topics  . Alcohol use: Yes    Comment: Consumes several beers every other day per patient stated  . Drug use: No     Allergies   Patient has no known allergies.   Review of Systems Review of Systems  Eyes: Positive for pain and discharge. Negative for photophobia and visual disturbance.  All other systems reviewed and are negative.    Physical Exam Triage Vital Signs ED Triage Vitals  Enc Vitals Group     BP 07/30/20 0854 (!) 140/93     Pulse Rate 07/30/20 0854 99     Resp 07/30/20 0854 17     Temp 07/30/20 0854 98.6 F (37 C)     Temp Source 07/30/20 0854 Oral     SpO2 07/30/20 0854 100 %     Weight --      Height --      Head Circumference --      Peak Flow --      Pain Score 07/30/20 0852 9     Pain Loc --      Pain Edu? --      Excl. in [place]? --    No data found.  Updated Vital Signs BP (!) 140/93 (BP Location: Left Arm)   Pulse 99   Temp 98.6 F (37 C) (Oral)   Resp 17   LMP 07/20/2020 (Exact Date)   SpO2 100%   Visual Acuity Right Eye Distance: 20/40 (Without correction) Left Eye Distance: 20/40 (Without correction) Bilateral Distance: 20/30 (Without correction)  Right Eye Near:   Left Eye Near:    Bilateral Near:     Physical Exam Vitals and nursing note reviewed.  Constitutional:      General: She is not in acute distress.    Appearance: Normal appearance. She is not ill-appearing, toxic-appearing or diaphoretic.  HENT:     Head: Normocephalic and atraumatic.  Eyes:     General:        Right eye: Hordeolum present.     Extraocular Movements: Extraocular movements  intact.     Conjunctiva/sclera: Conjunctivae normal.  Cardiovascular:     Rate and Rhythm: Normal rate.  Pulses: Normal pulses.  Pulmonary:     Effort: Pulmonary effort is normal.  Abdominal:     General: Abdomen is flat.  Musculoskeletal:        General: Normal range of motion.     Cervical back: Normal range of motion.  Skin:    General: Skin is warm and dry.  Neurological:     General: No focal deficit present.     Mental Status: She is alert and oriented to person, place, and time.  Psychiatric:        Mood and Affect: Mood normal.      UC Treatments / Results  Labs (all labs ordered are listed, but only abnormal results are displayed) Labs Reviewed - No data to display  EKG   Radiology No results found.  Procedures Procedures (including critical care time)  Medications Ordered in UC Medications - No data to display  Initial Impression / Assessment and Plan / UC Course  I have reviewed the triage vital signs and the nursing notes.  Pertinent labs & imaging results that were available during my care of the patient were reviewed by me and considered in my medical decision making (see chart for details).    Assessment negative for red flags or concerns.  Erythromycin ointment 4 times a day for the next 5 to 7 days.  Apply warm compress as needed for comfort.  Recommend washing eyelids with warm water or baby shampoo.  Follow-up with ophthalmology if symptoms do not improve.  Otherwise follow-up with primary care as needed. Final Clinical Impressions(s) / UC Diagnoses   Final diagnoses:  Hordeolum externum of right lower eyelid     Discharge Instructions     Apply the erythomycin ointment 4 times a day for the next 5-7 days, or until symptoms have resolved.    Apply a warm compress to your eye as needed for comfort.   Perform lid washing: Either warm water or very dilute baby shampoo can be placed on a clean wash cloth, gauze pad, or cotton swab. Then be  advised to gently clean along the lashes and lid margin to remove the accumulated material with care to avoid contacting the ocular surface. If shampoo is used, thorough rinsing is recommended. Vigorous washing should be avoided, as it may cause more irritation.   Follow up with ophthalmology for further evaluation and management if symptoms persists Return or go to ER if you have any new or worsening symptoms such as fever, chills, redness, swelling, eye pain, painful eye movements, vision changes.    ED Prescriptions    Medication Sig Dispense Auth. Provider   erythromycin ophthalmic ointment Place a 1/2 inch ribbon of ointment into the lower eyelid. 3.5 g Pearson Forster, NP     PDMP not reviewed this encounter.   Pearson Forster, NP 07/30/20 [phone]

## 2020-08-06 ENCOUNTER — Encounter (HOSPITAL_COMMUNITY): Payer: Self-pay

## 2020-10-06 ENCOUNTER — Ambulatory Visit (HOSPITAL_COMMUNITY)
Admission: EM | Admit: 2020-10-06 | Discharge: 2020-10-06 | Disposition: A | Discharge status: Home / Self Care | Payer: Medicaid Other | Attending: Family Medicine | Admitting: Family Medicine

## 2020-10-06 ENCOUNTER — Other Ambulatory Visit: Payer: Self-pay

## 2020-10-06 ENCOUNTER — Encounter (HOSPITAL_COMMUNITY): Payer: Self-pay

## 2020-10-06 DIAGNOSIS — M2140 Flat foot [pes planus] (acquired), unspecified foot: Secondary | ICD-10-CM

## 2020-10-06 DIAGNOSIS — M79672 Pain in left foot: Secondary | ICD-10-CM

## 2020-10-06 MED ORDER — MELOXICAM 15 MG PO TABS
15.0000 mg | ORAL_TABLET | Freq: Every day | ORAL | 0 refills | Status: DC
  Filled 2020-10-06: qty 15, 15d supply, fill #0

## 2020-10-06 NOTE — ED Triage Notes (Signed)
Pt presents with c/o left foot pain X 2 months.   States the pain has gotten worse and c/o swelling.   Pt states she feels a burning feeling when applying pressure to her foot.

## 2020-10-06 NOTE — ED Provider Notes (Signed)
[place]    CSN: [id number] Arrival date & time: 10/06/20  1428      History   Chief Complaint Chief Complaint  Patient presents with   Foot Pain    HPI Gina Shelton is a 45 y.o. female.   1 month of progressively worsening left foot pain She reports that the pain started on the bottom of her foot and moved up into her toes She states over the last week or so she has had worsening of the pain on the lateral aspect of her foot into her ankle.  She states that it also radiates from the lateral aspect of her foot into her left big toe She states sometimes she has some burning with this She denies any other numbness or tingling States that she does have worsening pain when she gets out of bed States that working and being up on her feet does make it worse She notices the pain is worse when she sits for long periods of time then goes to stand She has never had pain like this before No prior injury   Past Medical History:  Diagnosis Date   Anxiety    Depression    Depression    GERD (gastroesophageal reflux disease)    Gout    ble   Migraine    Migraine     Patient Active Problem List   Diagnosis Date Noted   COPD exacerbation ([place]) 09/10/2019   Chest pain 09/27/2017   Polysubstance abuse ([place]) 09/27/2017   Hypertensive urgency 09/26/2017   Obesity (BMI 30.0-34.9) 05/22/2017   Ovarian cyst 05/22/2017   Cavernous hemangioma of liver 05/22/2017   Major depressive disorder, single episode, severe without psychosis ([place]) 12/31/2015   Major depressive disorder, 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) 06/07/2013   Cocaine abuse ([place]) 06/07/2013   DEPRESSION, MAJOR, RECURRENT 08/17/2007   ANXIETY STATE NOS 11/15/2006    Past Surgical History:  Procedure Laterality Date   CESAREAN SECTION  infection at incission requiring return to OR x 2   TUBAL LIGATION      OB History     Gravida  3   Para  3    Term  0   Preterm  0   AB  0   Living         SAB  0   IAB  0   Ectopic  0   Multiple      Live Births               Home Medications    Prior to Admission medications   Medication Sig Start Date End Date Taking? Authorizing Provider  meloxicam (MOBIC) 15 MG tablet Take 1 tablet (15 mg total) by mouth daily. 10/06/20  Yes Kinsey Cowsert, Bernita Raisin, DO  albuterol (VENTOLIN HFA) 108 (90 Base) MCG/ACT inhaler INHALE 2 PUFFS INTO THE LUNGS EVERY 6 (SIX) HOURS AS NEEDED FOR WHEEZING OR SHORTNESS OF BREATH. 02/28/20 02/27/21  [name], NP  amLODipine (NORVASC) 10 MG tablet TAKE 1 TABLET (10 MG TOTAL) BY MOUTH DAILY. 09/10/19 09/09/20  [name], NP  azithromycin (ZITHROMAX) 250 MG tablet TAKE 2 TABLETS BY MOUTH ON DAY 1 THEN TAKE 1 TABLET DAILY FOR THE NEXT 4 DAYS Patient taking differently: Take by mouth daily. for the next 4 days 02/28/20 02/27/21  [name], NP  baclofen (LIORESAL) 10 MG tablet Take 1 tablet (10 mg total) by mouth 3 (three)  times daily as needed for muscle spasms. 09/23/19   Hilts, Michael, MD  Blood Pressure Monitor KIT 1 kit by Does not apply route 3 (three) times daily as needed. 04/04/19   [name], NP  erythromycin ophthalmic ointment Place a 1/2 inch ribbon of ointment into the lower eyelid. 07/30/20   Pearson Forster, NP  fluticasone (FLONASE) 50 MCG/ACT nasal spray PLACE 2 SPRAYS INTO BOTH NOSTRILS DAILY. 02/28/20 02/27/21  [name], NP  hydrochlorothiazide (HYDRODIURIL) 25 MG tablet TAKE 1 TABLET (25 MG TOTAL) BY MOUTH DAILY. 09/10/19 09/09/20  [name], NP  ibuprofen (ADVIL) 800 MG tablet TAKE 1 TABLET (800 MG TOTAL) BY MOUTH EVERY 8 (EIGHT) HOURS AS NEEDED. 01/24/20 01/23/21  [name], NP  omeprazole (PRILOSEC) 20 MG capsule TAKE 1 CAPSULE (20 MG TOTAL) BY MOUTH DAILY. 01/24/20 01/23/21  [name], NP    Family History Family History  Problem Relation Age of Onset    Hypertension Mother    Arthritis Sister    Anesthesia problems Neg Hx    Hypotension Neg Hx    Malignant hyperthermia Neg Hx    Pseudochol deficiency Neg Hx     Social History Social History   Tobacco Use   Smoking status: Every Day    Packs/day: 1.00    Years: 0.00    Pack years: 0.00    Types: Cigarettes   Smokeless tobacco: Never  Vaping Use   Vaping Use: Never used  Substance Use Topics   Alcohol use: Yes    Comment: Consumes several beers every other day per patient stated   Drug use: No     Allergies   Patient has no known allergies.   Review of Systems Review of Systems  Constitutional:  Negative for chills and fever.  Eyes:  Negative for visual disturbance.  Respiratory:  Negative for shortness of breath.   Cardiovascular:  Negative for chest pain.  Gastrointestinal:  Negative for abdominal pain, diarrhea, nausea and vomiting.  Genitourinary:  Negative for difficulty urinating and flank pain.  Musculoskeletal:        Left foot pain  Skin:  Negative for color change, rash and wound.  Neurological:  Negative for dizziness, speech difficulty, weakness, numbness and headaches.    Physical Exam Triage Vital Signs ED Triage Vitals  Enc Vitals Group     BP 10/06/20 1532 (!) 166/106     Pulse Rate 10/06/20 1531 79     Resp 10/06/20 1531 17     Temp 10/06/20 1531 98.7 F (37.1 C)     Temp Source 10/06/20 1531 Oral     SpO2 10/06/20 1531 98 %     Weight --      Height --      Head Circumference --      Peak Flow --      Pain Score 10/06/20 1529 8     Pain Loc --      Pain Edu? --      Excl. in [place]? --    No data found.  Updated Vital Signs BP (!) 166/106 (BP Location: Left Arm)   Pulse 79   Temp 98.7 F (37.1 C) (Oral)   Resp 17   LMP 09/19/2020 (Exact Date)   SpO2 98%   Visual Acuity Right Eye Distance:   Left Eye Distance:   Bilateral Distance:    Right Eye Near:   Left Eye Near:    Bilateral Near:  Physical Exam Constitutional:       General: She is not in acute distress.    Appearance: Normal appearance. She is not ill-appearing.  HENT:     Head: Normocephalic and atraumatic.  Eyes:     Extraocular Movements: Extraocular movements intact.  Cardiovascular:     Rate and Rhythm: Normal rate.     Pulses:          Dorsalis pedis pulses are 2+ on the right side and 2+ on the left side.  Pulmonary:     Effort: Pulmonary effort is normal.     Breath sounds: Normal breath sounds.  Musculoskeletal:       Feet:  Feet:     Right foot:     Skin integrity: Skin integrity normal.     Left foot:     Skin integrity: Skin integrity normal.     Comments: Full ROM b/l feet Neuro vascularly intact distally b/l 5/5 strength in all fields of left foot/ankle So pain with resisted eversion and dorsiflexion Pes planus Neurological:     General: No focal deficit present.     Mental Status: She is alert and oriented to person, place, and time.     UC Treatments / Results  Labs (all labs ordered are listed, but only abnormal results are displayed) Labs Reviewed - No data to display  EKG   Radiology No results found.  Procedures Procedures (including critical care time)  Medications Ordered in UC Medications - No data to display      Bedsid performed of left foot Left plantar fascia measures 0.54 cm in width, suggestive of plantar fasciitis Her left peroneal tendons were visualized in transverse and long axis she has pain in this area during the examination there is some increased fluid surrounding both the longus and brevis tendons when she reports pain that radiates to the underside of her first MTP with pressure placed on her peroneus longus tendon by the ultrasound probe.  Patient's fifth metatarsal was visualized in transverse and longitudinal planes without any cortical irregularity. Impression: Left foot plantar fasciitis, possible peroneal tenosynovitis  Initial Impression / Assessment and Plan / UC  Course  I have reviewed the triage vital signs and the nursing notes.  Pertinent labs & imaging results that were available during my care of the patient were reviewed by me and considered in my medical decision making (see chart for details).     Patient is a 45 year old female with past medical history significant for depression, anxiety, polysubstance abuse, hypertension, who presents with left foot pain for 1 month that is progressively worsening.  She has exam findings are consistent with plantar fasciitis in the history that supports this as well.  Some of her pain may also be related to peroneal tenosynovitis and she seems to have vomiting that is in the distribution of her peroneus longus tendon and peroneus brevis.  Discussed with patient that bedside ultrasound with a portable ultrasound can be difficult to evaluate the smaller structures and would recommend that she follow-up with the sports medicine office for a more formal ultrasound and further treatment of her symptoms as her pes planus is likely contributing to her symptoms.  Did discuss discontinuing ibuprofen and using meloxicam in the meantime.  She will avoid all other NSAIDs while taking meloxicam daily.  Can also have her stretch with a towel before getting out of bed to help her plantar fascia stretch before getting up.  She will follow-up with sports medicine within  1 week.  Advised her to return to care immediately if her pain significantly worsens, she develops redness, fever, signs of infection.  In regards to her hypertension, she is not having any symptoms to suggest hypertensive emergency.  She was advised to follow-up with her primary care providers for further management of her hypertension and given ED precautions.  She was discharged home in stable condition. Final Clinical Impressions(s) / UC Diagnoses   Final diagnoses:  Foot pain, left  Pes planus, unspecified laterality     Discharge Instructions      You  have pain in your foot from irritation of your peroneal tendons and plantar fasciitis.  This is likely worsening by your flat feet.  Take meloxicam daily.  Do not take ibuprofen, Aleve, Advil with this.  You can use arch supports in the meantime.  Also stretch your foot with a towel like I showed you prior to getting out of bed or when you are sitting for long periods of time.  Please call sports medicine office to follow-up within 1 week.  If your pain significantly worsens, you are unable to bear weight, you start having fevers, or redness in the area you should be seen right away.  In regards to your blood pressure, it is high today.  If you start having chest pain, difficulty breathing, changes in your vision, difficulty urinating, new pain in your upper back on the sides you should be seen in the emergency room right away.  Follow-up with your primary care provider for your blood pressure.     ED Prescriptions     Medication Sig Dispense Auth. Provider   meloxicam (MOBIC) 15 MG tablet Take 1 tablet (15 mg total) by mouth daily. 15 tablet Jeremiah Curci, Bernita Raisin, DO      PDMP not reviewed this encounter.   Wilhemenia Camba, Bernita Raisin, DO 10/06/20 1721

## 2020-10-06 NOTE — Discharge Instructions (Addendum)
You have pain in your foot from irritation of your peroneal tendons and plantar fasciitis.  This is likely worsening by your flat feet.  Take meloxicam daily.  Do not take ibuprofen, Aleve, Advil with this.  You can use arch supports in the meantime.  Also stretch your foot with a towel like I showed you prior to getting out of bed or when you are sitting for long periods of time.  Please call sports medicine office to follow-up within 1 week.  If your pain significantly worsens, you are unable to bear weight, you start having fevers, or redness in the area you should be seen right away.  In regards to your blood pressure, it is high today.  If you start having chest pain, difficulty breathing, changes in your vision, difficulty urinating, new pain in your upper back on the sides you should be seen in the emergency room right away.  Follow-up with your primary care provider for your blood pressure.

## 2020-10-07 ENCOUNTER — Other Ambulatory Visit: Payer: Self-pay

## 2020-10-09 ENCOUNTER — Other Ambulatory Visit: Payer: Self-pay

## 2020-10-14 ENCOUNTER — Ambulatory Visit: Payer: Medicaid Other | Admitting: Family Medicine

## 2020-10-21 ENCOUNTER — Other Ambulatory Visit: Payer: Self-pay

## 2020-10-21 ENCOUNTER — Ambulatory Visit (INDEPENDENT_AMBULATORY_CARE_PROVIDER_SITE_OTHER): Payer: Medicaid Other | Admitting: Family Medicine

## 2020-10-21 VITALS — BP 138/80 | Ht 62.5 in | Wt 179.0 lb

## 2020-10-21 DIAGNOSIS — G8929 Other chronic pain: Secondary | ICD-10-CM

## 2020-10-21 DIAGNOSIS — M25572 Pain in left ankle and joints of left foot: Secondary | ICD-10-CM

## 2020-10-21 DIAGNOSIS — M79672 Pain in left foot: Secondary | ICD-10-CM

## 2020-10-21 MED ORDER — MELOXICAM 15 MG PO TABS
15.0000 mg | ORAL_TABLET | Freq: Every day | ORAL | 1 refills | Status: DC
  Filled 2020-10-21 – 2021-04-02 (×3): qty 30, 30d supply, fill #0

## 2020-10-21 NOTE — Assessment & Plan Note (Signed)
Likely secondary to pes planus and plantar fasciitis.  She will continue stretching and was given exercises for plantar fasciitis including heel raises.  Continue meloxicam per above.  Arch supports also per above.  Follow-up in 6 to 8 weeks if no improvement.

## 2020-10-21 NOTE — Patient Instructions (Signed)
Thank you for coming to see me today. It was a pleasure. Today we talked about:   Your peroneal tendons are irritated because your arch is flat.  We will have you take meloxicam daily for this.  We will also have you do exercises and use arch supports.  Continue your morning stretches and we will give you exercises for plantar fasciitis as well.  Please follow-up with [place] in 6-8 weeks if no improvement in your pain.  If you have any questions or concerns, please do not hesitate to call the office at [phone].  Best,   Ari[place] Constable, DO [place]

## 2020-10-21 NOTE — Progress Notes (Signed)
Gina Shelton is a 45 y.o. female who presents to [facility] today for the following:  Left Ankle and Foot Pain Seen in UC on 7/26 for the same She reports that pain overall has been occurring for the last 6 months Has been progressively worsening over the last month No injury Worst pain is at the lateral aspect of her left ankle that extends to the fifth metatarsal, she also has pain at the medial heel that extends into her arch She has been using meloxicam for the last 15 days and had improvement with this, but is now out She has also been performing stretching exercises in the morning, that helps with the pain in the bottom of her foot, but does not help the pain on the side of her foot When the pain is severe and she has pressing on the area that hurts she occasionally has burning, no numbness and tingling   PMH reviewed.  ROS as above. Medications reviewed.  Exam:  BP 138/80   Ht 5' 2.5" (1.588 m)   Wt 179 lb (81.2 kg)   BMI 32.22 kg/m  Gen: Well NAD MSK:  Left Ankle: - Inspection: No obvious deformity, erythema, swelling, or ecchymosis, ulcers, calluses, blisters b/l - Palpation: She has tenderness to palpation just posterior to the left lateral malleolus in the region of the peroneal tendons and tenderness to palpation following it to the base of the fifth metatarsal, but no tenderness at the base of the fifth metatarsal specifically.  No TTP at MT heads, no TTP over cuboid, no tenderness over navicular prominence, no TTP over lateral or medial malleolus.  - Strength: Normal strength with dorsiflexion, plantarflexion, inversion, and eversion of foot; flexion and extension of toes b/l, she does have pain with resisted eversion of her left ankle - ROM: Full ROM b/l - Neuro/vasc: NV intact distally bilaterally - Special Tests: Negative anterior drawer, normal inversion test.  Negative syndesmotic compression.  Feet: She has bilateral pes planus with tenderness to palpation of  the left medial calcaneal origin of the plantar fascia.  She also has tenderness palpation through the mid region of the plantar fascia on the left foot.  Normal calcaneal inversion on the right, no calcaneal inversion on the left.   Limited US Left ankle:  - Peroneus longus and brevis: Evaluated in longitudinal and transverse planes. Intact with TTP along length of peroneus brevis tendon to insertion at fifth metatarsal.  Peroneal tendon not well visualized, however no C sign or 3 fingers sign appreciated in transverse or longitudinal planes respectively. - Plantar fascia: Plantar fascia measures to 0.57 cm, no other deformities noted  No results found.   Assessment and Plan: 1) Chronic pain of left ankle Her pain is most consistent with tenosynovitis of her peroneal brevis tendon, findings correlated on ultrasound.  This is most likely related to the patient's significant pes planus.  We will give her arch support and exercises to assist with this.  Given a refill of meloxicam to continue.  Follow-up in 6 to 8 weeks if no improvement.  Left foot pain Likely secondary to pes planus and plantar fasciitis.  She will continue stretching and was given exercises for plantar fasciitis including heel raises.  Continue meloxicam per above.  Arch supports also per above.  Follow-up in 6 to 8 weeks if no improvement.   [place] Constable, D.O.  PGY-4 [facility]  10/21/2020 2:20 PM  Addendum:  Patient seen in the office by fellow.  His history, exam, plan of care were precepted with me.  Karlton Lemon MD [place]

## 2020-10-21 NOTE — Assessment & Plan Note (Signed)
Her pain is most consistent with tenosynovitis of her peroneal brevis tendon, findings correlated on ultrasound.  This is most likely related to the patient's significant pes planus.  We will give her arch support and exercises to assist with this.  Given a refill of meloxicam to continue.  Follow-up in 6 to 8 weeks if no improvement.

## 2020-10-28 ENCOUNTER — Other Ambulatory Visit: Payer: Self-pay

## 2020-11-06 ENCOUNTER — Other Ambulatory Visit: Payer: Self-pay

## 2020-11-06 ENCOUNTER — Telehealth (INDEPENDENT_AMBULATORY_CARE_PROVIDER_SITE_OTHER): Payer: Self-pay | Admitting: Primary Care

## 2020-11-06 MED FILL — Ibuprofen Tab 800 MG: ORAL | 20 days supply | Qty: 60 | Fill #0 | Status: AC

## 2020-11-12 ENCOUNTER — Other Ambulatory Visit: Payer: Self-pay

## 2020-11-18 ENCOUNTER — Other Ambulatory Visit (INDEPENDENT_AMBULATORY_CARE_PROVIDER_SITE_OTHER): Payer: Self-pay | Admitting: Primary Care

## 2020-11-18 DIAGNOSIS — I10 Essential (primary) hypertension: Secondary | ICD-10-CM

## 2020-11-18 MED ORDER — AMLODIPINE BESYLATE 10 MG PO TABS
ORAL_TABLET | Freq: Every day | ORAL | 0 refills | Status: DC
  Filled 2020-11-18: qty 30, 30d supply, fill #0

## 2020-11-18 MED ORDER — HYDROCHLOROTHIAZIDE 25 MG PO TABS
ORAL_TABLET | Freq: Every day | ORAL | 0 refills | Status: DC
  Filled 2020-11-18: qty 30, 30d supply, fill #0

## 2020-11-18 NOTE — Telephone Encounter (Signed)
Sent in [facility]p meds only 30 day supply

## 2020-11-18 NOTE — Telephone Encounter (Signed)
Sent to PCP to send emergency refill until patient appointment if possible.

## 2020-11-18 NOTE — Telephone Encounter (Signed)
Pt scheduled an appt and wanted to know if an emergency supply could be sent in of her meds, please advise.

## 2020-11-19 ENCOUNTER — Other Ambulatory Visit: Payer: Self-pay

## 2020-11-24 ENCOUNTER — Other Ambulatory Visit: Payer: Self-pay

## 2020-11-26 ENCOUNTER — Other Ambulatory Visit: Payer: Self-pay

## 2020-12-07 ENCOUNTER — Ambulatory Visit (INDEPENDENT_AMBULATORY_CARE_PROVIDER_SITE_OTHER): Payer: Medicaid Other | Admitting: Primary Care

## 2021-01-25 ENCOUNTER — Ambulatory Visit (HOSPITAL_COMMUNITY)
Admission: EM | Admit: 2021-01-25 | Discharge: 2021-01-25 | Disposition: A | Discharge status: Home / Self Care | Payer: Medicaid Other | Attending: Emergency Medicine | Admitting: Emergency Medicine

## 2021-01-25 ENCOUNTER — Other Ambulatory Visit: Payer: Self-pay

## 2021-01-25 ENCOUNTER — Encounter (HOSPITAL_COMMUNITY): Payer: Self-pay | Admitting: Emergency Medicine

## 2021-01-25 DIAGNOSIS — I10 Essential (primary) hypertension: Secondary | ICD-10-CM

## 2021-01-25 DIAGNOSIS — J441 Chronic obstructive pulmonary disease with (acute) exacerbation: Secondary | ICD-10-CM | POA: Diagnosis not present

## 2021-01-25 MED ORDER — AMLODIPINE BESYLATE 10 MG PO TABS
ORAL_TABLET | Freq: Every day | ORAL | 0 refills | Status: DC
  Filled 2021-01-25: qty 30, 30d supply, fill #0

## 2021-01-25 MED ORDER — AZITHROMYCIN 250 MG PO TABS
250.0000 mg | ORAL_TABLET | Freq: Every day | ORAL | 0 refills | Status: DC
  Filled 2021-01-25: qty 6, 5d supply, fill #0

## 2021-01-25 MED ORDER — ALBUTEROL SULFATE HFA 108 (90 BASE) MCG/ACT IN AERS
2.0000 | INHALATION_SPRAY | RESPIRATORY_TRACT | 1 refills | Status: DC | PRN
  Filled 2021-01-25: qty 18, 16d supply, fill #0

## 2021-01-25 MED ORDER — PREDNISONE 20 MG PO TABS
40.0000 mg | ORAL_TABLET | Freq: Every day | ORAL | 0 refills | Status: DC
  Filled 2021-01-25: qty 10, 5d supply, fill #0

## 2021-01-25 NOTE — Discharge Instructions (Signed)
Today you are being treated for COPD exacerbation  You may use your albuterol inhaler 2 puffs every 4 hours as needed for wheezing or shortness of breath  Take prednisone every morning with food for the next 5 days  Take azithromycin daily as prescribed on packet  Your blood pressure medication has been refilled for 1 month, please notify your primary care provider for follow-up and further medication management, continue to take medication daily as prescribed  At any point if you begin to have blurred vision, chest pain, increased shortness of breath, abdominal pain, pain in your arms, dizziness, lightheadedness and your blood pressure is elevated please go to the nearest emergency department for further evaluation

## 2021-01-25 NOTE — ED Provider Notes (Signed)
[place]    CSN: [id number] Arrival date & time: 01/25/21  0856      History   Chief Complaint Chief Complaint  Patient presents with   Cough    HPI Gina Shelton is a 45 y.o. female.   Patient presents with productive cough, increased nasal congestion, increased shortness of breath predominately after periods of coughing and wheezing predominately at nighttime.  Denies chest pain or tightness, URI symptoms, fever, chills.  Daily tobacco smoker.  History of COPD.  Endorses that she has been using her albuterol inhaler more frequently.  Requesting medication refill, has PCP who she has seen within a year but has denied notifying Dr. Parks Ranger her medication has run out.  Endorses that she is not taking her blood pressure medication in 3 weeks.  Endorses occasionally she has blurred vision but is not present today.  Denies associated chest pain, dizziness, lightheadedness, abdominal pain, arm pain.   Past Medical History:  Diagnosis Date   Anxiety    Depression    Depression    GERD (gastroesophageal reflux disease)    Gout    ble   Migraine    Migraine     Patient Active Problem List   Diagnosis Date Noted   Chronic pain of left ankle 10/21/2020   Left foot pain 10/21/2020   COPD exacerbation ([place]) 09/10/2019   Chest pain 09/27/2017   Polysubstance abuse ([place]) 09/27/2017   Hypertensive urgency 09/26/2017   Obesity (BMI 30.0-34.9) 05/22/2017   Ovarian cyst 05/22/2017   Cavernous hemangioma of liver 05/22/2017   Major depressive disorder, single episode, severe without psychosis ([place]) 12/31/2015   Major depressive disorder, 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 Authorizing Provider  albuterol (VENTOLIN HFA) 108 (90 Base) MCG/ACT inhaler INHALE 2 PUFFS INTO THE LUNGS EVERY 6 (SIX) HOURS AS NEEDED FOR WHEEZING OR SHORTNESS OF BREATH. 02/28/20 02/27/21  [name], NP  amLODipine (NORVASC) 10 MG tablet TAKE 1 TABLET (10 MG TOTAL) BY MOUTH DAILY. Patient taking differently: Take by mouth daily. 01/25/2021-OUT OF MEDICINE FOR 3 WEEKS 11/18/20 11/18/21  [name], NP  azithromycin (ZITHROMAX) 250 MG tablet TAKE 2 TABLETS BY MOUTH ON DAY 1 THEN TAKE 1 TABLET DAILY FOR THE NEXT 4 DAYS Patient not taking: Reported on 01/25/2021 02/28/20 02/27/21  [name], NP  baclofen (LIORESAL) 10 MG tablet Take 1 tablet (10 mg total) by mouth 3 (three) times daily as needed for muscle spasms. Patient not taking: Reported on 01/25/2021 09/23/19   Hilts, Legrand Como, MD  Blood Pressure Monitor KIT 1 kit by Does not apply  route 3 (three) times daily as needed. 04/04/19   [name], NP  erythromycin ophthalmic ointment Place a 1/2 inch ribbon of ointment into the lower eyelid. Patient not taking: Reported on 01/25/2021 07/30/20   Pearson Forster, NP  fluticasone (FLONASE) 50 MCG/ACT nasal spray PLACE 2 SPRAYS INTO BOTH NOSTRILS DAILY. Patient not taking: Reported on 01/25/2021 02/28/20 02/27/21  [name], NP  hydrochlorothiazide (HYDRODIURIL) 25 MG tablet TAKE 1 TABLET (25 MG TOTAL) BY MOUTH DAILY. Patient taking differently: Take by mouth daily. 01/25/2021-OUT OF MEDICINES FOR 3 WEEKS 11/18/20 11/18/21  [name], NP  meloxicam (MOBIC) 15 MG tablet Take 1 tablet (15 mg total) by mouth daily. 10/21/20   Meccariello, Bernita Raisin, DO   omeprazole (PRILOSEC) 20 MG capsule TAKE 1 CAPSULE (20 MG TOTAL) BY MOUTH DAILY. Patient not taking: Reported on 01/25/2021 01/24/20 01/23/21  [name], NP    Family History Family History  Problem Relation Age of Onset   Hypertension Mother    Arthritis Sister    Anesthesia problems Neg Hx    Hypotension Neg Hx    Malignant hyperthermia Neg Hx    Pseudochol deficiency Neg Hx     Social History Social History   Tobacco Use   Smoking status: Every Day    Packs/day: 1.00    Years: 0.00    Pack years: 0.00    Types: Cigarettes   Smokeless tobacco: Never  Vaping Use   Vaping Use: Never used  Substance Use Topics   Alcohol use: Yes    Comment: Consumes several beers every other day per patient stated   Drug use: No     Allergies   Patient has no known allergies.   Review of Systems Review of Systems  Constitutional: Negative.   HENT:  Positive for congestion. Negative for dental problem, drooling, ear discharge, ear pain, facial swelling, hearing loss, mouth sores, nosebleeds, postnasal drip, rhinorrhea, sinus pressure, sinus pain, sneezing, sore throat, tinnitus, trouble swallowing and voice change.   Respiratory:  Positive for cough, shortness of breath and wheezing. Negative for apnea, choking, chest tightness and stridor.   Gastrointestinal: Negative.   Skin: Negative.   Neurological: Negative.     Physical Exam Triage Vital Signs ED Triage Vitals  Enc Vitals Group     BP 01/25/21 1031 (!) 184/112     Pulse --      Resp 01/25/21 1031 18     Temp 01/25/21 1031 98.4 F (36.9 C)     Temp Source 01/25/21 1031 Oral     SpO2 01/25/21 1031 100 %     Weight --      Height --      Head Circumference --      Peak Flow --      Pain Score 01/25/21 1026 6     Pain Loc --      Pain Edu? --      Excl. in [place]? --    No data found.  Updated Vital Signs BP (!) 184/112 Comment: out of blood pressure medicine for 3 weeks  Temp 98.4 F (36.9 C) (Oral)    Resp 18   LMP 01/12/2021   SpO2 100%   Visual Acuity Right Eye Distance:   Left Eye Distance:   Bilateral Distance:    Right Eye Near:   Left Eye Near:    Bilateral Near:     Physical Exam Constitutional:      Appearance: Normal appearance. She  is normal weight.  HENT:     Head: Normocephalic.     Nose: Congestion present. No rhinorrhea.     Mouth/Throat:     Mouth: Mucous membranes are moist.     Pharynx: Oropharynx is clear.  Eyes:     Extraocular Movements: Extraocular movements intact.  Cardiovascular:     Rate and Rhythm: Normal rate and regular rhythm.     Pulses: Normal pulses.     Heart sounds: Normal heart sounds.  Pulmonary:     Effort: Pulmonary effort is normal.     Breath sounds: Normal breath sounds.  Skin:    General: Skin is warm and dry.  Neurological:     Mental Status: She is alert and oriented to person, place, and time. Mental status is at baseline.  Psychiatric:        Mood and Affect: Mood normal.        Behavior: Behavior normal.     UC Treatments / Results  Labs (all labs ordered are listed, but only abnormal results are displayed) Labs Reviewed - No data to display  EKG   Radiology No results found.  Procedures Procedures (including critical care time)  Medications Ordered in UC Medications - No data to display  Initial Impression / Assessment and Plan / UC Course  I have reviewed the triage vital signs and the nursing notes.  Pertinent labs & imaging results that were available during my care of the patient were reviewed by me and considered in my medical decision making (see chart for details).  COPD exacerbation Elevated blood pressure reading in office with diagnosis of hypertension  1.  Albuterol inhaler 108 mcg 2 puffs every 4 hours as needed, refilled 2.  Azithromycin 250 mg as directed 3.  Prednisone 40 mg daily for 5 days 4.  Amlodipine 10 mg daily refilled for 1 month, advised patient to continue to take as  prescribed and notify primary care doctor for further management, no signs of hypertensive emergency today, patient given strict return precautions for any signs to go to nearest emergency department, verbalized understanding Final Clinical Impressions(s) / UC Diagnoses   Final diagnoses:  None   Discharge Instructions   None    ED Prescriptions   None    PDMP not reviewed this encounter.   Hans Eden, NP 01/25/21 1118

## 2021-01-25 NOTE — ED Triage Notes (Signed)
Cough for 5 days.  Also complains of sob.  Reports thick, yellow phlegm and headache.  Cough is worse at night

## 2021-02-17 ENCOUNTER — Ambulatory Visit (HOSPITAL_COMMUNITY)
Admission: EM | Admit: 2021-02-17 | Discharge: 2021-02-17 | Disposition: A | Discharge status: Home / Self Care | Payer: Medicaid Other | Attending: Emergency Medicine | Admitting: Emergency Medicine

## 2021-02-17 ENCOUNTER — Ambulatory Visit (INDEPENDENT_AMBULATORY_CARE_PROVIDER_SITE_OTHER): Payer: Medicaid Other

## 2021-02-17 ENCOUNTER — Encounter (HOSPITAL_COMMUNITY): Payer: Self-pay

## 2021-02-17 ENCOUNTER — Other Ambulatory Visit: Payer: Self-pay

## 2021-02-17 DIAGNOSIS — M545 Low back pain, unspecified: Secondary | ICD-10-CM | POA: Diagnosis not present

## 2021-02-17 DIAGNOSIS — M5136 Other intervertebral disc degeneration, lumbar region: Secondary | ICD-10-CM | POA: Diagnosis not present

## 2021-02-17 MED ORDER — METHYLPREDNISOLONE 4 MG PO TBPK: ORAL_TABLET | Freq: Every day | ORAL | 0 refills | Status: DC

## 2021-02-17 MED ORDER — ACETAMINOPHEN 325 MG PO TABS
975.0000 mg | ORAL_TABLET | Freq: Once | ORAL | Status: AC
  Administered 2021-02-17: 975 mg via ORAL

## 2021-02-17 MED ORDER — TIZANIDINE HCL 4 MG PO TABS: 4.0000 mg | ORAL_TABLET | Freq: Three times a day (TID) | ORAL | 0 refills | Status: DC | PRN

## 2021-02-17 MED ORDER — KETOROLAC TROMETHAMINE 30 MG/ML IJ SOLN
30.0000 mg | Freq: Once | INTRAMUSCULAR | Status: AC
  Administered 2021-02-17: 30 mg via INTRAMUSCULAR

## 2021-02-17 MED ORDER — KETOROLAC TROMETHAMINE 30 MG/ML IJ SOLN
INTRAMUSCULAR | Status: AC
  Filled 2021-02-17: qty 1

## 2021-02-17 MED ORDER — ACETAMINOPHEN 325 MG PO TABS
ORAL_TABLET | ORAL | Status: AC
  Filled 2021-02-17: qty 3

## 2021-02-17 NOTE — Discharge Instructions (Addendum)
Your x-ray showed worsening degenerative disc disease compared to x-rays from July 2021.  I suspect this is causing your pain.  You most likely need an MRI.  Please follow-up with orthopedics for further management and to have this done.  Take the Mobic in the morning with 1000 mg of Tylenol.  You may take an additional 1000 mg of Tylenol 2 or 3 more times a day.  Do not take it more than 4 times a day.  Heat, Epsom salt soaks.  Many people find gentle stretching and deep tissue massage helpful. Try [facility] on [address]. They have very reasonable prices and take walk ins. Or you can go to  [facility]. Follow-up with orthopedics ASAP.  ER for the signs and symptoms we discussed.  Go to www.goodrx.com  or www.costplusdrugs.com to look up your medications. This will give you a list of where you can find your prescriptions at the most affordable prices. Or ask the pharmacist what the cash price is, or if they have any other discount programs available to help make your medication more affordable. This can be less expensive than what you would pay with insurance.

## 2021-02-17 NOTE — ED Triage Notes (Signed)
Pt presents with leg, back and hip pain x 4 days.   States the pain is worsening.

## 2021-02-17 NOTE — ED Provider Notes (Signed)
HPI  SUBJECTIVE:  Gina Shelton is a 45 y.o. female who presents with 4 days of bilateral sharp, throbbing low back pain that radiates down both of her thighs.  She reports right leg weakness, numbness and tingling, and states that her right leg is giving out.  This started yesterday.  She states that she has had symptoms like this before, but this is more intense than usual.  She denies fevers, pain worse at night, unintentional weight loss, night sweats, trauma, recent heavy lifting, repetitive motion, saddle anesthesia, urinary fecal incontinence, urinary retention, abdominal pain, urinary complaints, hip pain.  She tried ibuprofen 800 mg with improvement in her symptoms.  Symptoms worse with bending forward, movement.  No antipyretic in the past 6 hours.  She states that she works 7 days last week, which is more than her usual.  She has a past medical history of hypertension, COPD, and finished a steroid taper 2 weeks ago.  She has a history of polysubstance abuse, has been clean since 2014, gout, sciatica, foot drop.  No history of IVDU, aortic abdominal aneurysm, prolonged steroid use.  LMP: 11/20.  Denies possibility being pregnant.  ELT:[id number], Milford Cage, NP  Orthopedics: Ortho care.  Was seen by orthopedics on 09/2019, labs done, patient sent home with Zanaflex and physical therapy.  They considered an MRI, but patient was lost to follow-up  Past Medical History:  Diagnosis Date   Anxiety    Depression    Depression    GERD (gastroesophageal reflux disease)    Gout    ble   Migraine    Migraine     Past Surgical History:  Procedure Laterality Date   CESAREAN SECTION  infection at incission requiring return to OR x 2   TUBAL LIGATION      Family History  Problem Relation Age of Onset   Hypertension Mother    Arthritis Sister    Anesthesia problems Neg Hx    Hypotension Neg Hx    Malignant hyperthermia Neg Hx    Pseudochol deficiency Neg Hx     Social History    Tobacco Use   Smoking status: Every Day    Packs/day: 1.00    Years: 0.00    Pack years: 0.00    Types: Cigarettes   Smokeless tobacco: Never  Vaping Use   Vaping Use: Never used  Substance Use Topics   Alcohol use: Yes    Comment: Consumes several beers every other day per patient stated   Drug use: No    No current facility-administered medications for this encounter.  Current Outpatient Medications:    methylPREDNISolone (MEDROL DOSEPAK) 4 MG TBPK tablet, Take by mouth daily. Follow package instructions, Disp: 21 tablet, Rfl: 0   tiZANidine (ZANAFLEX) 4 MG tablet, Take 1 tablet (4 mg total) by mouth every 8 (eight) hours as needed for muscle spasms., Disp: 30 tablet, Rfl: 0   albuterol (VENTOLIN HFA) 108 (90 Base) MCG/ACT inhaler, INHALE 2 PUFFS INTO THE LUNGS EVERY 6 (SIX) HOURS AS NEEDED FOR WHEEZING OR SHORTNESS OF BREATH., Disp: 8.5 g, Rfl: 1   albuterol (VENTOLIN HFA) 108 (90 Base) MCG/ACT inhaler, Inhale 2 puffs into the lungs every 4 (four) hours as needed for wheezing or shortness of breath., Disp: 18 g, Rfl: 1   amLODipine (NORVASC) 10 MG tablet, TAKE 1 TABLET (10 MG TOTAL) BY MOUTH DAILY., Disp: 30 tablet, Rfl: 0   Blood Pressure Monitor KIT, 1 kit by Does not apply route 3 (three)  times daily as needed., Disp: 1 kit, Rfl: 0   fluticasone (FLONASE) 50 MCG/ACT nasal spray, PLACE 2 SPRAYS INTO BOTH NOSTRILS DAILY. (Patient not taking: Reported on 01/25/2021), Disp: 16 g, Rfl: 11   hydrochlorothiazide (HYDRODIURIL) 25 MG tablet, TAKE 1 TABLET (25 MG TOTAL) BY MOUTH DAILY. (Patient taking differently: Take by mouth daily. 01/25/2021-OUT OF MEDICINES FOR 3 WEEKS), Disp: 30 tablet, Rfl: 0   meloxicam (MOBIC) 15 MG tablet, Take 1 tablet (15 mg total) by mouth daily., Disp: 30 tablet, Rfl: 1  No Known Allergies   ROS  As noted in HPI.   Physical Exam  BP (!) 149/93 (BP Location: Left Arm)   Pulse 84   Temp 98.7 F (37.1 C) (Oral)   Resp 17   LMP 01/31/2021 (Exact  Date)   SpO2 99%   Constitutional: Well developed, well nourished, appears uncomfortable.  Pain aggravated with large positional changes. Eyes:  EOMI, conjunctiva normal bilaterally HENT: Normocephalic, atraumatic,mucus membranes moist Respiratory: Normal inspiratory effort Cardiovascular: Normal rate GI: nondistended soft.  No suprapubic, flank tenderness skin: No rash, skin intact Back: No CVAT bilateral paralumbar tenderness, left paralumbar muscle spasm.  Midline bony tenderness at L5/S1.. Bilateral lower extremities nontender , baseline ROM with intact DP pulses, .pain with hip flexion and extension against resistance bilaterally, pain with AB duction left leg, AB/adduction of right leg.  No hip pain during these maneuvers. SLR neg bilaterally. Sensation intact to light touch bilaterally over both legs, DTR's symmetric and intact bilaterally KJ, Motor symmetric bilateral 5/5 hip flexion, quadriceps, hamstrings, EHL, foot dorsiflexion, foot plantarflexion, gait somewhat antalgic but without apparent new ataxia.  Musculoskeletal: no deformities Neurologic: Alert & oriented x 3, no focal neuro deficits Psychiatric: Speech and behavior appropriate   ED Course   Medications  acetaminophen (TYLENOL) tablet 975 mg (975 mg Oral Given 02/17/21 1720)  ketorolac (TORADOL) 30 MG/ML injection 30 mg (30 mg Intramuscular Given 02/17/21 1720)    Orders Placed This Encounter  Procedures   DG Lumbar Spine Complete    Standing Status:   Standing    Number of Occurrences:   1    Order Specific Question:   Reason for Exam (SYMPTOM  OR DIAGNOSIS REQUIRED)    Answer:   tenderness L5/S1 r/o acute changes    Order Specific Question:   Is patient pregnant?    Answer:   No    No results found for this or any previous visit (from the past 24 hour(s)). DG Lumbar Spine Complete  Result Date: 02/17/2021 CLINICAL DATA:  Lower back pain. EXAM: LUMBAR SPINE - COMPLETE 4+ VIEW COMPARISON:  Lumbar spine x-ray  09/17/2019. FINDINGS: There is no evidence for acute fracture or dislocation. Spinal alignment is within normal limits. Disc spaces are maintained. There is endplate osteophyte formation at L2-L3 compatible with degenerative change. This has progressed compared to the prior study. Soft tissues are within normal limits. IMPRESSION: 1. No acute bony abnormality. 2. Mild degenerative changes at L2-L3 have increased compared to the prior study. Electronically Signed   By: Ronney Asters M.D.   On: 02/17/2021 16:51    ED Clinical Impression  1. Degenerative disc disease, lumbar   2. Acute bilateral low back pain, unspecified whether sciatica present      ED Assessment/Plan  Previous records reviewed.  As noted in HPI.  Patient with bony tenderness, several red flags.  Will obtain L-spine films.  Giving Toradol 30 mg IM and Tylenol 975 mg here. Doubt an infection, cauda  equina syndrome or other emergency.  I do believe that she needs an MRI.  According to chart review, she was seen by orthopedics in 7/21, sent home with Zanaflex and physical therapy.  MRI was considered, but patient never followed up as her pain resolved.  No records of MRI.  Reviewed imaging independently.  Degenerative changes at L2/L3 which have increased compared to x-ray from July 2021.  See radiology report for full details.  Patient with worsening arthritis.  Home with Mobic, patient states that she does not need a prescription.  She is to take 1000 mg of Tylenol with the Mobic in the morning and additional Tylenol 2 or 3 more times a day for total of 4 times a day maximum.  Home with heat, gentle stretching, Zanaflex, Medrol Dosepak.  Follow-up with Ortho care within the week.  ER return precautions given.  Work note-return on Saturday  Discussed imaging, MDM, treatment plan, and plan for follow-up with patient. Discussed sn/sx that should prompt return to the ED. patient agrees with plan.   Meds ordered this encounter   Medications   acetaminophen (TYLENOL) tablet 975 mg   ketorolac (TORADOL) 30 MG/ML injection 30 mg   tiZANidine (ZANAFLEX) 4 MG tablet    Sig: Take 1 tablet (4 mg total) by mouth every 8 (eight) hours as needed for muscle spasms.    Dispense:  30 tablet    Refill:  0   methylPREDNISolone (MEDROL DOSEPAK) 4 MG TBPK tablet    Sig: Take by mouth daily. Follow package instructions    Dispense:  21 tablet    Refill:  0      *This clinic note was created using Dragon dictation software. Therefore, there may be occasional mistakes despite careful proofreading.  ?    Melynda Ripple, MD 02/17/21 1724

## 2021-02-18 ENCOUNTER — Telehealth (HOSPITAL_COMMUNITY): Payer: Self-pay | Admitting: Emergency Medicine

## 2021-02-18 ENCOUNTER — Other Ambulatory Visit: Payer: Self-pay

## 2021-02-18 MED ORDER — METHYLPREDNISOLONE 4 MG PO TBPK
ORAL_TABLET | Freq: Every day | ORAL | 0 refills | Status: DC
Start: 2021-02-18 — End: 2021-03-31
  Filled 2021-02-18: qty 21, 7d supply, fill #0

## 2021-02-18 MED ORDER — TIZANIDINE HCL 4 MG PO TABS
4.0000 mg | ORAL_TABLET | Freq: Three times a day (TID) | ORAL | 0 refills | Status: DC | PRN
Start: 2021-02-18 — End: 2021-08-15
  Filled 2021-02-18: qty 30, 10d supply, fill #0

## 2021-02-19 ENCOUNTER — Other Ambulatory Visit: Payer: Self-pay

## 2021-02-19 ENCOUNTER — Ambulatory Visit (INDEPENDENT_AMBULATORY_CARE_PROVIDER_SITE_OTHER): Payer: Medicaid Other | Admitting: Physician Assistant

## 2021-02-19 DIAGNOSIS — M5442 Lumbago with sciatica, left side: Secondary | ICD-10-CM | POA: Diagnosis not present

## 2021-02-19 DIAGNOSIS — M5441 Lumbago with sciatica, right side: Secondary | ICD-10-CM | POA: Diagnosis not present

## 2021-02-22 NOTE — Progress Notes (Addendum)
Office Visit Note   Patient: Gina Shelton           Date of Birth: 1975-07-13           MRN: [id number] Visit Date: 02/19/2021              Requested by: [name], NP [address] [place],  [place] [address] PCP: [name], NP  Chief Complaint  Patient presents with   Lower Back - Pain      HPI: Patient is a pleasant 45 year old woman with a history of low back pain and has been radiating into both of her thighs.  She describes a sharp throbbing in the back.  Also history of right leg weakness numbness and tingling.  Symptoms started around December 6.  States that ibuprofen has helped her symptoms hurt worse with bending forward.  X-rays done in the emergency room demonstrated some degenerative changes L2-3 no loss of bowel or bladder bladder control  Assessment & Plan: Visit Diagnoses:  1. Bilateral low back pain with bilateral sciatica, unspecified chronicity     Plan: Patient has a history of paresthesias in lower back this is new.  Did discuss given her symptoms would recommend and MRI. Should follow up afterwards. To discuss results  Follow-Up Instructions: No follow-ups on file.   Ortho Exam Lower back  no step off. No tenderness. DTRs are symmetric. Strength of lowere extremities is intact  and equal. No pain with flexion or extension. Negative straight leg raise Patient is alert, oriented, no adenopathy, well-dressed, normal affect, normal respiratory effort.   Imaging: No results found. No images are attached to the encounter.  Labs: Lab Results  Component Value Date   HGBA1C 5.2 10/25/2017   HGBA1C 5.5 01/02/2016   HGBA1C 5.4 05/29/2014   ESRSEDRATE 19 09/17/2019   ESRSEDRATE 10 02/28/2009   CRP 14.0 (H) 09/17/2019   LABURIC 2.9 09/17/2019   LABURIC 3.1 07/09/2007   REPTSTATUS 09/24/2015 FINAL 09/23/2015   CULT MULTIPLE SPECIES PRESENT, SUGGEST RECOLLECTION (A) 09/23/2015     Lab Results  Component Value Date   ALBUMIN  4.0 12/31/2019   ALBUMIN 4.6 04/04/2019   ALBUMIN 3.9 04/03/2018    No results found for: MG Lab Results  Component Value Date   VD25OH 9 (L) 09/17/2019    No results found for: PREALBUMIN CBC EXTENDED Latest Ref Rng & Units 12/31/2019 12/31/2019 04/04/2019  WBC 4.0 - 10.5 K/uL - 7.2 7.8  RBC 3.87 - 5.11 MIL/uL - 4.24 4.11  HGB 12.0 - 15.0 g/dL 12.6 10.9(L) 12.3  HCT 36.0 - 46.0 % 37.0 36.5 37.4  PLT 150 - 400 K/uL - 348 281  NEUTROABS 1.7 - 7.7 K/uL - 4.7 4.8  LYMPHSABS 0.7 - 4.0 K/uL - 1.7 2.0     There is no height or weight on file to calculate BMI.  Orders:  No orders of the defined types were placed in this encounter.  No orders of the defined types were placed in this encounter.    Procedures: No procedures performed  Clinical Data: No additional findings.  ROS:  All other systems negative, except as noted in the HPI. Review of Systems  Objective: Vital Signs: LMP 01/31/2021 (Exact Date)   Specialty Comments:  No specialty comments available.  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/2015   Alcohol dependence ([place]) 06/07/2013   Cocaine abuse ([place]) 06/07/2013   DEPRESSION, MAJOR, RECURRENT 08/17/2007   ANXIETY STATE NOS 11/15/2006   Past Medical History:  Diagnosis Date   Anxiety    Depression    Depression    GERD (gastroesophageal reflux disease)    Gout    ble   Migraine    Migraine     Family History  Problem Relation Age of Onset    Hypertension Mother    Arthritis Sister    Anesthesia problems Neg Hx    Hypotension Neg Hx    Malignant hyperthermia Neg Hx    Pseudochol deficiency Neg Hx     Past Surgical History:  Procedure Laterality Date   CESAREAN SECTION  infection at incission requiring return to OR x 2   TUBAL LIGATION     Social History   Occupational History   Not on file  Tobacco Use   Smoking status: Every Day    Packs/day: 1.00    Years: 0.00    Pack years: 0.00    Types: Cigarettes   Smokeless tobacco: Never  Vaping Use   Vaping Use: Never used  Substance and Sexual Activity   Alcohol use: Yes    Comment: Consumes several beers every other day per patient stated   Drug use: No   Sexual activity: Yes

## 2021-03-22 ENCOUNTER — Ambulatory Visit (INDEPENDENT_AMBULATORY_CARE_PROVIDER_SITE_OTHER): Payer: Self-pay

## 2021-03-22 NOTE — Telephone Encounter (Signed)
°  Chief Complaint: Depression Symptoms: Depressed. Son recently incarcerated. Lives with daughter. Frequency: Started 1 month ago Pertinent Negatives: Patient denies suicidal thoughts. Given suicide hotline number. Disposition: [] ED /[] Urgent Care (no appt availability in office) / [] Appointment(In office/virtual)/ []  [facility]/ [] Home Care/ [] Refused Recommended Disposition /[] [facility]/ []  Follow-up with PCP Additional Notes: Pt. Requests to be worked in for depression. States was on depression "medicine a long time ago and I have been off it a long time." Has been to [facility] and [facility], but requests to see Ms. Edward. Practice currently closed for lunch. Pleaseadvise.    Answer Assessment - Initial Assessment Questions 1. CONCERN: "What happened that made you call today?"     Depression 2. DEPRESSION SYMPTOM SCREENING: "How are you feeling overall?" (e.g., decreased energy, increased sleeping or difficulty sleeping, difficulty concentrating, feelings of sadness, guilt, hopelessness, or worthlessness)     Not sleeping or eating 3. RISK OF HARM - SUICIDAL IDEATION:  "Do you ever have thoughts of hurting or killing yourself?"  (e.g., yes, no, no but preoccupation with thoughts about death)   - INTENT:  "Do you have thoughts of hurting or killing yourself right NOW?" (e.g., yes, no, N/A)   - PLAN: "Do you have a specific plan for how you would do this?" (e.g., gun, knife, overdose, no plan, N/A)     No 4. RISK OF HARM - HOMICIDAL IDEATION:  "Do you ever have thoughts of hurting or killing someone else?"  (e.g., yes, no, no but preoccupation with thoughts about death)   - INTENT:  "Do you have thoughts of hurting or killing someone right NOW?" (e.g., yes, no, N/A)   - PLAN: "Do you have a specific plan for how you would do this?" (e.g., gun, knife, no plan, N/A)      No 5. FUNCTIONAL IMPAIRMENT: "How have things been going for you overall? Have  you had more difficulty than usual doing your normal daily activities?"  (e.g., better, same, worse; self-care, school, work, interactions)     Worse 6. SUPPORT: "Who is with you now?" "Who do you live with?" "Do you have family or friends who you can talk to?"      Daughter 7. THERAPIST: "Do you have a counselor or therapist? Name?"     No 8. STRESSORS: "Has there been any new stress or recent changes in your life?"     Son in prison 9. ALCOHOL USE OR SUBSTANCE USE (DRUG USE): "Do you drink alcohol or use any illegal drugs?"     Alcohol  10. OTHER: "Do you have any other physical symptoms right now?" (e.g., fever)       No 11. PREGNANCY: "Is there any chance you are pregnant?" "When was your last menstrual period?"       No  Protocols used: Depression-A-AH

## 2021-03-31 ENCOUNTER — Other Ambulatory Visit: Payer: Self-pay

## 2021-03-31 ENCOUNTER — Encounter (INDEPENDENT_AMBULATORY_CARE_PROVIDER_SITE_OTHER): Payer: Self-pay | Admitting: Primary Care

## 2021-03-31 ENCOUNTER — Ambulatory Visit (INDEPENDENT_AMBULATORY_CARE_PROVIDER_SITE_OTHER): Payer: Medicaid Other | Admitting: Primary Care

## 2021-03-31 VITALS — BP 158/106 | HR 89 | Temp 98.2°F | Ht 62.5 in | Wt 173.4 lb

## 2021-03-31 DIAGNOSIS — J018 Other acute sinusitis: Secondary | ICD-10-CM | POA: Diagnosis not present

## 2021-03-31 DIAGNOSIS — I10 Essential (primary) hypertension: Secondary | ICD-10-CM

## 2021-03-31 DIAGNOSIS — F321 Major depressive disorder, single episode, moderate: Secondary | ICD-10-CM

## 2021-03-31 MED ORDER — BUPROPION HCL ER (XL) 150 MG PO TB24
150.0000 mg | ORAL_TABLET | Freq: Every day | ORAL | 0 refills | Status: DC
  Filled 2021-03-31: qty 90, 90d supply, fill #0

## 2021-03-31 MED ORDER — AMLODIPINE BESYLATE 10 MG PO TABS
10.0000 mg | ORAL_TABLET | Freq: Every day | ORAL | 1 refills | Status: DC
  Filled 2021-03-31: qty 90, 90d supply, fill #0

## 2021-03-31 MED ORDER — HYDROCHLOROTHIAZIDE 25 MG PO TABS
ORAL_TABLET | Freq: Every day | ORAL | 1 refills | Status: DC
  Filled 2021-03-31: qty 90, 90d supply, fill #0

## 2021-03-31 MED ORDER — LEVOCETIRIZINE DIHYDROCHLORIDE 5 MG PO TABS
5.0000 mg | ORAL_TABLET | Freq: Every evening | ORAL | 1 refills | Status: DC
  Filled 2021-03-31: qty 30, 30d supply, fill #0

## 2021-03-31 MED ORDER — FLUTICASONE PROPIONATE 50 MCG/ACT NA SUSP
2.0000 | Freq: Every day | NASAL | 3 refills | Status: DC
  Filled 2021-03-31: qty 16, 30d supply, fill #0

## 2021-03-31 NOTE — Progress Notes (Signed)
Gina Shelton, is a 46 y.o. female  IAX:[id number]  MBE:[id number]  DOB - February 26, 1976  Chief Complaint  Patient presents with   Depression        Hypertension       Subjective:   Gina Shelton is a 46 y.o. female here today for a management of HTN and requesting medication refill. She has been out of medication for 3 weeks. She admits to shortness of breath, headaches and  chest pain. Patient has had No abdominal pain - No Nausea, No new weakness tingling or numbness. She has been undergoing a lot of stress to include a lower back pain with little to no relief. She will call ortho and set up f/u appt. She is very upset her son was charged with murder in November and sent to jail. She has very limited time to spend with him and that is only via phone. Patient crying during entire visit.   No problems updated.  ALLERGIES: No Known Allergies  PAST MEDICAL HISTORY: Past Medical History:  Diagnosis Date   Anxiety    Depression    Depression    GERD (gastroesophageal reflux disease)    Gout    ble   Migraine    Migraine     MEDICATIONS AT HOME: Prior to Admission medications   Medication Sig Start Date End Date Taking? Authorizing Provider  albuterol (VENTOLIN HFA) 108 (90 Base) MCG/ACT inhaler Inhale 2 puffs into the lungs every 4 (four) hours as needed for wheezing or shortness of breath. 01/25/21  Yes White, Adrienne R, NP  meloxicam (MOBIC) 15 MG tablet Take 1 tablet (15 mg total) by mouth daily. 10/21/20  Yes Meccariello, Bernita Raisin, DO  tiZANidine (ZANAFLEX) 4 MG tablet Take 1 tablet (4 mg total) by mouth every 8 (eight) hours as needed for muscle spasms. 02/18/21  Yes Melynda Ripple, MD  albuterol (VENTOLIN HFA) 108 (90 Base) MCG/ACT inhaler INHALE 2 PUFFS INTO THE LUNGS EVERY 6 (SIX) HOURS AS NEEDED FOR WHEEZING OR SHORTNESS OF BREATH. 02/28/20 02/27/21  [name], NP  amLODipine (NORVASC) 10 MG tablet TAKE 1 TABLET (10 MG TOTAL) BY MOUTH DAILY. Patient  not taking: Reported on 03/31/2021 01/25/21 01/25/22  Hans Eden, NP  Blood Pressure Monitor KIT 1 kit by Does not apply route 3 (three) times daily as needed. 04/04/19   [name], NP  fluticasone (FLONASE) 50 MCG/ACT nasal spray PLACE 2 SPRAYS INTO BOTH NOSTRILS DAILY. Patient not taking: Reported on 01/25/2021 02/28/20 02/27/21  [name], NP  hydrochlorothiazide (HYDRODIURIL) 25 MG tablet TAKE 1 TABLET (25 MG TOTAL) BY MOUTH DAILY. Patient not taking: Reported on 03/31/2021 11/18/20 11/18/21  [name], NP    Objective:   Vitals:   03/31/21 1452 03/31/21 1505  BP: (!) 159/116 (!) 158/106  Pulse: 84 89  Temp: 98.2 F (36.8 C)   TempSrc: Oral   SpO2: 98%   Weight: 173 lb 6.4 oz (78.7 kg)   Height: 5' 2.5" (1.588 m)    Exam General appearance : Awake, alert, not in any distress. Speech Clear. Not toxic looking HEENT: Atraumatic and Normocephalic, pupils equally reactive to light and accomodation Neck: Supple, no JVD. No cervical lymphadenopathy.  Chest: Good air entry bilaterally, no added sounds  CVS: S1 S2 regular, no murmurs.  Abdomen: Bowel sounds present, Non tender and not distended with no gaurding, rigidity or rebound. Extremities: B/L Lower Ext shows no edema, both legs are warm to touch Neurology: Awake  alert, and oriented X 3, CN II-XII intact, Non focal Skin: No Rash  Data Review Lab Results  Component Value Date   HGBA1C 5.2 10/25/2017   HGBA1C 5.5 01/02/2016   HGBA1C 5.4 05/29/2014    Assessment & Plan  Gina Shelton was seen today for depression and hypertension.  Diagnoses and all orders for this visit:  Current moderate episode of major depressive disorder, unspecified whether recurrent (HCC) -     buPROPion (WELLBUTRIN XL) 150 MG 24 hr tablet; Take 1 tablet (150 mg total) by mouth daily.  Essential hypertension Counseled on blood pressure goal of less than 130/80, low-sodium, DASH diet, medication compliance, 150 minutes of  moderate intensity exercise per week. Discussed medication compliance, adverse effects.  -     amLODipine (NORVASC) 10 MG tablet; Take 1 tablet (10 mg total) by mouth daily. -     hydrochlorothiazide (HYDRODIURIL) 25 MG tablet; TAKE 1 TABLET (25 MG TOTAL) BY MOUTH DAILY.  Other acute sinusitis, recurrence not specified -     fluticasone (FLONASE) 50 MCG/ACT nasal spray; PLACE 2 SPRAYS INTO BOTH NOSTRILS DAILY.  Other orders -     levocetirizine (XYZAL ALLERGY 24HR) 5 MG tablet; Take 1 tablet (5 mg total) by mouth every evening.  Patient have been counseled extensively about nutrition and exercise. Other issues discussed during this visit include: low cholesterol diet, weight control and daily exercise, foot care, annual eye examinations at Ophthalmology, importance of adherence with medications and regular follow-up. We also discussed long term complications of uncontrolled diabetes and hypertension.   Return in about 5 weeks (around 05/05/2021) for axiety/depression /bp .  The patient was given clear instructions to go to ER or return to medical center if symptoms don't improve, worsen or new problems develop. The patient verbalized understanding. The patient was told to call to get lab results if they haven't heard anything in the next week.   This note has been created with Surveyor, quantity. Any transcriptional errors are unintentional.    [name],  03/31/2021, 5:20 PM

## 2021-03-31 NOTE — Progress Notes (Signed)
Pt has been out of Bp medication for 3 weeks

## 2021-04-02 ENCOUNTER — Other Ambulatory Visit: Payer: Self-pay

## 2021-04-05 ENCOUNTER — Other Ambulatory Visit: Payer: Self-pay

## 2021-04-05 ENCOUNTER — Telehealth: Payer: Self-pay | Admitting: *Deleted

## 2021-04-05 DIAGNOSIS — M5442 Lumbago with sciatica, left side: Secondary | ICD-10-CM

## 2021-04-05 DIAGNOSIS — M5441 Lumbago with sciatica, right side: Secondary | ICD-10-CM

## 2021-04-05 NOTE — Telephone Encounter (Signed)
Pt called left vm to return her call, I called back and sw pt and she stated she was in on 02/19/21 and saw [facility] about her lower back and MaryAnne was going to place order for MRI lumbar spine. I see no note statng this. Please advise.

## 2021-04-05 NOTE — Telephone Encounter (Signed)
[place] for [employer]

## 2021-04-06 ENCOUNTER — Other Ambulatory Visit: Payer: Self-pay

## 2021-04-22 ENCOUNTER — Encounter: Payer: Self-pay | Admitting: Orthopaedic Surgery

## 2021-04-22 ENCOUNTER — Other Ambulatory Visit: Payer: Self-pay

## 2021-04-22 ENCOUNTER — Ambulatory Visit (INDEPENDENT_AMBULATORY_CARE_PROVIDER_SITE_OTHER): Payer: Medicaid Other | Admitting: Orthopaedic Surgery

## 2021-04-22 DIAGNOSIS — M5442 Lumbago with sciatica, left side: Secondary | ICD-10-CM | POA: Diagnosis not present

## 2021-04-22 DIAGNOSIS — M5441 Lumbago with sciatica, right side: Secondary | ICD-10-CM

## 2021-04-22 DIAGNOSIS — G8929 Other chronic pain: Secondary | ICD-10-CM | POA: Diagnosis not present

## 2021-04-22 DIAGNOSIS — M545 Low back pain, unspecified: Secondary | ICD-10-CM | POA: Insufficient documentation

## 2021-04-22 NOTE — Progress Notes (Signed)
Office Visit Note   Patient: Gina Shelton           Date of Birth: 1975/11/07           MRN: [id number] Visit Date: 04/22/2021              Requested by: Grayce Sessions, NP [address] [place],  [place] [address] PCP: Grayce Sessions, NP   Assessment & Plan: Visit Diagnoses:  1. Chronic bilateral low back pain with bilateral sciatica     Plan: Ms. Lubold has been seen for evaluation of low back pain.  She has had a problem for well over a year and does interfere with a lot of her activities.  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  Clerance Lav had ordered an MRI scan but apparently it was initially denied but has be been recertified and is to be performed this afternoon at 5:00.  She has had trouble for well over a year with pain in her back rating to her buttock and into both lower extremities.  On occasion she has numbness and tingling bilaterally.  No bowel or bladder dysfunction.  In the past she has been on gabapentin and ibuprofen and even prednisone without much relief.  She did have some degenerative changes by plain film but I think the MRI scan will be very important in terms of her diagnosis and treatment options.  We will plan to see her back shortly after the scan  Follow-Up Instructions: Return After MRI scan.   Orders:  No orders of the defined types were placed in this encounter.  No orders of the defined types were placed in this encounter.     Procedures: No procedures performed   Clinical Data: No additional findings.   Subjective: Chief Complaint  Patient presents with   Lower Back - Pain  Patient presents today for her lower back. She saw [facility] and had an MRI ordered. We were told the MRI was denied. Patient is back today for reassessment with Dr.Shahid Flori.  HPI  Review of Systems   Objective: Vital Signs: Ht 5' 2.5" (1.588 m)    Wt 172 lb (78 kg)    BMI 30.96 kg/m    Physical Exam Constitutional:      Appearance: She is well-developed.  Eyes:     Pupils: Pupils are equal, round, and reactive to light.  Pulmonary:     Effort: Pulmonary effort is normal.  Skin:    General: Skin is warm and dry.  Neurological:     Mental Status: She is alert and oriented to person, place, and time.  Psychiatric:        Behavior: Behavior normal.    Ortho Exam awake alert and oriented x3.  Comfortable sitting.  Straight leg raise was negative bilaterally.  Reflexes were symmetrical.  There was some percussible tenderness lower lumbar spine.  Painless range of motion of both hips.  Motor exam intact  Specialty Comments:  No specialty comments available.  Imaging: No results found.   PMFS History: Patient Active Problem List   Diagnosis Date Noted   Low back pain 04/22/2021   Chronic pain of left ankle 10/21/2020   Left foot pain 10/21/2020   COPD exacerbation (HCC) 09/10/2019   Chest pain 09/27/2017   Polysubstance abuse (HCC) 09/27/2017   Hypertensive urgency 09/26/2017   Obesity (BMI 30.0-34.9) 05/22/2017   Ovarian cyst  05/22/2017   Cavernous hemangioma of liver 05/22/2017   Major depressive disorder, single episode, severe without psychosis (HCC) 12/31/2015   Major depressive disorder, recurrent severe without psychotic features (HCC) 12/31/2015   Alcohol dependence (HCC) 06/07/2013   Cocaine abuse (HCC) 06/07/2013   DEPRESSION, MAJOR, RECURRENT 08/17/2007   ANXIETY STATE NOS 11/15/2006   Past Medical History:  Diagnosis Date   Anxiety    Depression    Depression    GERD (gastroesophageal reflux disease)    Gout    ble   Migraine    Migraine     Family History  Problem Relation Age of Onset   Hypertension Mother    Arthritis Sister    Anesthesia problems Neg Hx    Hypotension Neg Hx    Malignant hyperthermia Neg Hx    Pseudochol deficiency Neg Hx     Past Surgical History:  Procedure Laterality Date   CESAREAN SECTION  infection  at incission requiring return to OR x 2   TUBAL LIGATION     Social History   Occupational History   Not on file  Tobacco Use   Smoking status: Every Day    Packs/day: 1.00    Years: 0.00    Pack years: 0.00    Types: Cigarettes   Smokeless tobacco: Never  Vaping Use   Vaping Use: Never used  Substance and Sexual Activity   Alcohol use: Yes    Comment: Consumes several beers every other day per patient stated   Drug use: No   Sexual activity: Yes

## 2021-04-29 ENCOUNTER — Ambulatory Visit (INDEPENDENT_AMBULATORY_CARE_PROVIDER_SITE_OTHER): Payer: Medicaid Other | Admitting: Primary Care

## 2021-04-29 ENCOUNTER — Other Ambulatory Visit: Payer: Medicaid Other

## 2021-05-06 ENCOUNTER — Ambulatory Visit (INDEPENDENT_AMBULATORY_CARE_PROVIDER_SITE_OTHER): Payer: Medicaid Other

## 2021-05-06 ENCOUNTER — Other Ambulatory Visit: Payer: Self-pay

## 2021-05-06 ENCOUNTER — Encounter (HOSPITAL_COMMUNITY): Payer: Self-pay

## 2021-05-06 ENCOUNTER — Ambulatory Visit (HOSPITAL_COMMUNITY)
Admission: EM | Admit: 2021-05-06 | Discharge: 2021-05-06 | Disposition: A | Discharge status: Home / Self Care | Payer: Medicaid Other | Attending: Family Medicine | Admitting: Family Medicine

## 2021-05-06 DIAGNOSIS — M79601 Pain in right arm: Secondary | ICD-10-CM

## 2021-05-06 DIAGNOSIS — M25521 Pain in right elbow: Secondary | ICD-10-CM | POA: Diagnosis not present

## 2021-05-06 MED ORDER — KETOROLAC TROMETHAMINE 30 MG/ML IJ SOLN
INTRAMUSCULAR | Status: AC
  Filled 2021-05-06: qty 1

## 2021-05-06 MED ORDER — PREDNISONE 20 MG PO TABS
40.0000 mg | ORAL_TABLET | Freq: Every day | ORAL | 0 refills | Status: AC
  Filled 2021-05-06: qty 10, 5d supply, fill #0

## 2021-05-06 MED ORDER — COLCHICINE 0.6 MG PO TABS
0.6000 mg | ORAL_TABLET | Freq: Every day | ORAL | 0 refills | Status: DC | PRN
  Filled 2021-05-06: qty 30, 30d supply, fill #0

## 2021-05-06 MED ORDER — KETOROLAC TROMETHAMINE 30 MG/ML IJ SOLN
30.0000 mg | Freq: Once | INTRAMUSCULAR | Status: AC
  Administered 2021-05-06: 30 mg via INTRAMUSCULAR

## 2021-05-06 NOTE — Discharge Instructions (Addendum)
Your x-ray did not showed only some very mild arthritis at the elbow.  You have been given a shot of toradol today to help the pain  Take prednisone 20 mg 2 daily for 5 days.  Colchicine 0.6 mg, 1 daily as needed for gout pain.  Go see your primary care office in the next couple of weeks.

## 2021-05-06 NOTE — ED Provider Notes (Signed)
[place]    CSN: [id number] Arrival date & time: 05/06/21  8366      History   Chief Complaint Chief Complaint  Patient presents with   Hand Pain   Arm Pain    HPI Gina Shelton is a 46 y.o. female.    Hand Pain  Here for right forearm and elbow pain.  It hurts to grip with her hand, it hurts also to supinate and pronate.  It also hurts to extend her elbow.  It feels best held in a flexed position.  She does have a history of gout and it is been sometime since she had a flare   Past Medical History:  Diagnosis Date   Anxiety    Depression    Depression    GERD (gastroesophageal reflux disease)    Gout    ble   Migraine    Migraine     Patient Active Problem List   Diagnosis Date Noted   Low back pain 04/22/2021   Chronic pain of left ankle 10/21/2020   Left foot pain 10/21/2020   COPD exacerbation ([place]) 09/10/2019   Chest pain 09/27/2017   Polysubstance abuse ([place]) 09/27/2017   Hypertensive urgency 09/26/2017   Obesity (BMI 30.0-34.9) 05/22/2017   Ovarian cyst 05/22/2017   Cavernous hemangioma of liver 05/22/2017   Major depressive disorder, single episode, severe without psychosis ([place]) 12/31/2015   Major depressive disorder, recurrent severe without psychotic features ([place]) 12/31/2015   Alcohol dependence ([place]) 06/07/2013   Cocaine abuse ([place]) 06/07/2013   DEPRESSION, MAJOR, RECURRENT 08/17/2007   ANXIETY STATE NOS 11/15/2006    Past Surgical History:  Procedure Laterality Date   CESAREAN SECTION  infection at incission requiring return to OR x 2   TUBAL LIGATION      OB History     Gravida  3   Para  3   Term  0   Preterm  0   AB  0   Living         SAB  0   IAB  0   Ectopic  0   Multiple      Live Births               Home Medications    Prior to Admission medications   Medication Sig Start Date End Date Taking? Authorizing Provider  colchicine 0.6 MG tablet Take 1 tablet (0.6 mg total) by mouth  daily as needed (gout pain). 05/06/21  Yes Barrett Henle, MD  predniSONE (DELTASONE) 20 MG tablet Take 2 tablets (40 mg total) by mouth daily with breakfast for 5 days. 05/06/21 05/11/21 Yes Barrett Henle, MD  albuterol (VENTOLIN HFA) 108 (90 Base) MCG/ACT inhaler INHALE 2 PUFFS INTO THE LUNGS EVERY 6 (SIX) HOURS AS NEEDED FOR WHEEZING OR SHORTNESS OF BREATH. 02/28/20 02/27/21  [name], NP  albuterol (VENTOLIN HFA) 108 (90 Base) MCG/ACT inhaler Inhale 2 puffs into the lungs every 4 (four) hours as needed for wheezing or shortness of breath. 01/25/21   White, Leitha Schuller, NP  amLODipine (NORVASC) 10 MG tablet Take 1 tablet (10 mg total) by mouth daily. 03/31/21 03/31/22  [name], NP  Blood Pressure Monitor KIT 1 kit by Does not apply route 3 (three) times daily as needed. 04/04/19   [name], NP  buPROPion (WELLBUTRIN XL) 150 MG 24 hr tablet Take 1 tablet (150 mg total) by mouth daily. 03/31/21   [name], NP  fluticasone (FLONASE) 50 MCG/ACT nasal spray PLACE 2 SPRAYS INTO BOTH NOSTRILS DAILY. 03/31/21 03/31/22  [name], NP  hydrochlorothiazide (HYDRODIURIL) 25 MG tablet TAKE 1 TABLET (25 MG TOTAL) BY MOUTH DAILY. 03/31/21 03/31/22  [name], NP  levocetirizine (XYZAL ALLERGY 24HR) 5 MG tablet Take 1 tablet (5 mg total) by mouth every evening. 03/31/21   [name], NP  meloxicam (MOBIC) 15 MG tablet Take 1 tablet (15 mg total) by mouth daily. 10/21/20   Meccariello, Bernita Raisin, DO  tiZANidine (ZANAFLEX) 4 MG tablet Take 1 tablet (4 mg total) by mouth every 8 (eight) hours as needed for muscle spasms. 02/18/21   Melynda Ripple, MD    Family History Family History  Problem Relation Age of Onset   Hypertension Mother    Arthritis Sister    Anesthesia problems Neg Hx    Hypotension Neg Hx    Malignant hyperthermia Neg Hx    Pseudochol deficiency Neg Hx     Social History Social History   Tobacco Use   Smoking status:  Every Day    Packs/day: 1.00    Years: 0.00    Pack years: 0.00    Types: Cigarettes   Smokeless tobacco: Never  Vaping Use   Vaping Use: Never used  Substance Use Topics   Alcohol use: Yes    Comment: Consumes several beers every other day per patient stated   Drug use: No     Allergies   Patient has no known allergies.   Review of Systems Review of Systems   Physical Exam Triage Vital Signs ED Triage Vitals  Enc Vitals Group     BP 05/06/21 1004 (S) (!) 161/107     Pulse Rate 05/06/21 1004 92     Resp 05/06/21 1004 18     Temp 05/06/21 1004 98.7 F (37.1 C)     Temp Source 05/06/21 1004 Oral     SpO2 05/06/21 1004 97 %     Weight --      Height --      Head Circumference --      Peak Flow --      Pain Score 05/06/21 1006 8     Pain Loc --      Pain Edu? --      Excl. in [place]? --    No data found.  Updated Vital Signs BP (S) (!) 161/107 (BP Location: Left Arm) Comment: has not taken BP meds today   Pulse 92    Temp 98.7 F (37.1 C) (Oral)    Resp 18    LMP 05/03/2021    SpO2 97%   Visual Acuity Right Eye Distance:   Left Eye Distance:   Bilateral Distance:    Right Eye Near:   Left Eye Near:    Bilateral Near:     Physical Exam Vitals reviewed.  Constitutional:      General: She is not in acute distress.    Appearance: She is not ill-appearing, toxic-appearing or diaphoretic.  Cardiovascular:     Rate and Rhythm: Normal rate and regular rhythm.  Pulmonary:     Effort: Pulmonary effort is normal.     Breath sounds: Normal breath sounds.  Musculoskeletal:     Comments: Mild erythema of the proximal forearm with good bit of tenderness there and into her upper right arm.  Range of motion causes pain.  Lymphadenopathy:     Cervical: No cervical adenopathy.  Skin:    Coloration:  Skin is not jaundiced or pale.  Neurological:     Mental Status: She is alert and oriented to person, place, and time.  Psychiatric:        Behavior: Behavior normal.      UC Treatments / Results  Labs (all labs ordered are listed, but only abnormal results are displayed) Labs Reviewed - No data to display  EKG   Radiology DG Elbow Complete Right  Result Date: 05/06/2021 CLINICAL DATA:  Elbow pain for 1 week.  No known injury. EXAM: RIGHT ELBOW - COMPLETE 3+ VIEW COMPARISON:  11/13/2010 FINDINGS: There is no evidence of fracture, dislocation, or joint effusion. Minimal degenerative changes at the ulnotrochlear articulation. Soft tissues are unremarkable. IMPRESSION: No acute osseous abnormality, right elbow. Minimal degenerative changes at the ulnotrochlear articulation. Electronically Signed   By: Davina Poke D.O.   On: 05/06/2021 10:29    Procedures Procedures (including critical care time)  Medications Ordered in UC Medications  ketorolac (TORADOL) 30 MG/ML injection 30 mg (has no administration in time range)    Initial Impression / Assessment and Plan / UC Course  I have reviewed the triage vital signs and the nursing notes.  Pertinent labs & imaging results that were available during my care of the patient were reviewed by me and considered in my medical decision making (see chart for details).     X-ray shows minimal degenerative changes at the elbow. Treat for gout flare and have her a sling to rest her arm. Final Clinical Impressions(s) / UC Diagnoses   Final diagnoses:  Right arm pain     Discharge Instructions      Your x-ray did not showed only some very mild arthritis at the elbow.  You have been given a shot of toradol today to help the pain  Take prednisone 20 mg 2 daily for 5 days.  Colchicine 0.6 mg, 1 daily as needed for gout pain.  Go see your primary care office in the next couple of weeks.       ED Prescriptions     Medication Sig Dispense Auth. Provider   predniSONE (DELTASONE) 20 MG tablet Take 2 tablets (40 mg total) by mouth daily with breakfast for 5 days. 10 tablet Barrett Henle, MD    colchicine 0.6 MG tablet Take 1 tablet (0.6 mg total) by mouth daily as needed (gout pain). 30 tablet Manjinder Breau, Gwenlyn Perking, MD      I have reviewed the PDMP during this encounter.   Barrett Henle, MD 05/06/21 1038

## 2021-05-06 NOTE — ED Triage Notes (Signed)
Pt presents with right hand & forearm pain and swelling X 1 week; pt reports Hx of carpal tunnel

## 2021-05-07 ENCOUNTER — Other Ambulatory Visit: Payer: Self-pay

## 2021-05-12 ENCOUNTER — Other Ambulatory Visit: Payer: Self-pay | Admitting: Physician Assistant

## 2021-05-12 DIAGNOSIS — M5442 Lumbago with sciatica, left side: Secondary | ICD-10-CM

## 2021-05-12 DIAGNOSIS — G8929 Other chronic pain: Secondary | ICD-10-CM

## 2021-05-13 ENCOUNTER — Ambulatory Visit (INDEPENDENT_AMBULATORY_CARE_PROVIDER_SITE_OTHER): Payer: Medicaid Other | Admitting: Clinical

## 2021-05-13 ENCOUNTER — Other Ambulatory Visit: Payer: Self-pay

## 2021-05-13 DIAGNOSIS — F411 Generalized anxiety disorder: Secondary | ICD-10-CM

## 2021-05-13 DIAGNOSIS — F332 Major depressive disorder, recurrent severe without psychotic features: Secondary | ICD-10-CM | POA: Diagnosis not present
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
## 2021-05-26 ENCOUNTER — Telehealth (INDEPENDENT_AMBULATORY_CARE_PROVIDER_SITE_OTHER): Payer: Self-pay

## 2021-05-26 NOTE — Telephone Encounter (Signed)
Copied from [place] [id number]. Topic: General - Other ?>> May 26, 2021  3:10 PM Camille Bal, [place] wrote: ?Reason for CRM:pt called in to change appt of Gina Shelton on 03/16, wants to see is she can schedule after 4 because she doesn't get off till then or reschedule. Please call back ?

## 2021-05-27 ENCOUNTER — Ambulatory Visit (INDEPENDENT_AMBULATORY_CARE_PROVIDER_SITE_OTHER): Payer: Medicaid Other | Admitting: Clinical

## 2021-06-03 ENCOUNTER — Other Ambulatory Visit: Payer: Self-pay

## 2021-06-03 ENCOUNTER — Ambulatory Visit (INDEPENDENT_AMBULATORY_CARE_PROVIDER_SITE_OTHER): Payer: Medicaid Other | Admitting: Clinical

## 2021-06-03 DIAGNOSIS — F332 Major depressive disorder, recurrent severe without psychotic features: Secondary | ICD-10-CM

## 2021-06-03 DIAGNOSIS — F411 Generalized anxiety disorder: Secondary | ICD-10-CM
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
## 2021-06-24 ENCOUNTER — Ambulatory Visit (INDEPENDENT_AMBULATORY_CARE_PROVIDER_SITE_OTHER): Payer: Medicaid Other | Admitting: Clinical

## 2021-07-09 ENCOUNTER — Telehealth: Payer: Self-pay | Admitting: Clinical

## 2021-07-09 NOTE — Telephone Encounter (Signed)
I called pt and let her know that her referral was sent to [facility]. I provided pt with contact information. ?

## 2021-07-14 ENCOUNTER — Other Ambulatory Visit: Payer: Self-pay

## 2021-07-14 ENCOUNTER — Emergency Department (HOSPITAL_COMMUNITY): Payer: Medicaid Other

## 2021-07-14 ENCOUNTER — Emergency Department (HOSPITAL_COMMUNITY)
Admission: EM | Admit: 2021-07-14 | Discharge: 2021-07-14 | Disposition: A | Discharge status: Home / Self Care | Payer: Medicaid Other | Attending: Emergency Medicine | Admitting: Emergency Medicine

## 2021-07-14 ENCOUNTER — Ambulatory Visit (HOSPITAL_COMMUNITY)
Admission: EM | Admit: 2021-07-14 | Discharge: 2021-07-14 | Disposition: A | Discharge status: Home / Self Care | Payer: Medicaid Other | Attending: Family Medicine | Admitting: Family Medicine

## 2021-07-14 ENCOUNTER — Encounter (HOSPITAL_COMMUNITY): Payer: Self-pay | Admitting: Emergency Medicine

## 2021-07-14 DIAGNOSIS — R110 Nausea: Secondary | ICD-10-CM | POA: Diagnosis not present

## 2021-07-14 DIAGNOSIS — N390 Urinary tract infection, site not specified: Secondary | ICD-10-CM | POA: Diagnosis not present

## 2021-07-14 DIAGNOSIS — A599 Trichomoniasis, unspecified: Secondary | ICD-10-CM | POA: Diagnosis not present

## 2021-07-14 DIAGNOSIS — D1809 Hemangioma of other sites: Secondary | ICD-10-CM | POA: Insufficient documentation

## 2021-07-14 DIAGNOSIS — I10 Essential (primary) hypertension: Secondary | ICD-10-CM | POA: Diagnosis not present

## 2021-07-14 DIAGNOSIS — K2970 Gastritis, unspecified, without bleeding: Secondary | ICD-10-CM | POA: Insufficient documentation

## 2021-07-14 DIAGNOSIS — R1013 Epigastric pain: Secondary | ICD-10-CM | POA: Diagnosis present

## 2021-07-14 DIAGNOSIS — R1084 Generalized abdominal pain: Secondary | ICD-10-CM | POA: Diagnosis not present

## 2021-07-14 DIAGNOSIS — Z79899 Other long term (current) drug therapy: Secondary | ICD-10-CM | POA: Insufficient documentation

## 2021-07-14 DIAGNOSIS — D1803 Hemangioma of intra-abdominal structures: Secondary | ICD-10-CM

## 2021-07-14 HISTORY — DX: Essential (primary) hypertension: I10

## 2021-07-14 HISTORY — DX: Chronic obstructive pulmonary disease, unspecified: J44.9

## 2021-07-14 LAB — COMPREHENSIVE METABOLIC PANEL
ALT: 21 U/L (ref 0–44)
AST: 24 U/L (ref 15–41)
Albumin: 4.1 g/dL (ref 3.5–5.0)
Alkaline Phosphatase: 97 U/L (ref 38–126)
Anion gap: 10 (ref 5–15)
BUN: 9 mg/dL (ref 6–20)
CO2: 22 mmol/L (ref 22–32)
Calcium: 9.2 mg/dL (ref 8.9–10.3)
Chloride: 106 mmol/L (ref 98–111)
Creatinine, Ser: 0.63 mg/dL (ref 0.44–1.00)
GFR, Estimated: >60 mL/min (ref >60)
Glucose, Bld: 91 mg/dL (ref 70–99)
Potassium: 4.1 mmol/L (ref 3.5–5.1)
Sodium: 138 mmol/L (ref 135–145)
Total Bilirubin: 0.6 mg/dL (ref 0.3–1.2)
Total Protein: 7.2 g/dL (ref 6.5–8.1)

## 2021-07-14 LAB — URINALYSIS, ROUTINE W REFLEX MICROSCOPIC
Bilirubin Urine: NEGATIVE
Glucose, UA: NEGATIVE mg/dL
Ketones, ur: NEGATIVE mg/dL
Nitrite: NEGATIVE
Protein, ur: NEGATIVE mg/dL
Specific Gravity, Urine: 1.017 (ref 1.005–1.030)
pH: 6 (ref 5.0–8.0)

## 2021-07-14 LAB — CBC WITH DIFFERENTIAL/PLATELET
Abs Immature Granulocytes: 0.03 10*3/uL (ref 0.00–0.07)
Basophils Absolute: 0 10*3/uL (ref 0.0–0.1)
Basophils Relative: 0 %
Eosinophils Absolute: 0.1 10*3/uL (ref 0.0–0.5)
Eosinophils Relative: 1 %
HCT: 40.1 % (ref 36.0–46.0)
Hemoglobin: 12.9 g/dL (ref 12.0–15.0)
Immature Granulocytes: 0 %
Lymphocytes Relative: 24 %
Lymphs Abs: 2 10*3/uL (ref 0.7–4.0)
MCH: 29.2 pg (ref 26.0–34.0)
MCHC: 32.2 g/dL (ref 30.0–36.0)
MCV: 90.7 fL (ref 80.0–100.0)
Monocytes Absolute: 0.7 10*3/uL (ref 0.1–1.0)
Monocytes Relative: 8 %
Neutro Abs: 5.6 10*3/uL (ref 1.7–7.7)
Neutrophils Relative %: 67 %
Platelets: 272 10*3/uL (ref 150–400)
RBC: 4.42 MIL/uL (ref 3.87–5.11)
RDW: 16.3 % — ABNORMAL HIGH (ref 11.5–15.5)
WBC: 8.4 10*3/uL (ref 4.0–10.5)
nRBC: 0 % (ref 0.0–0.2)

## 2021-07-14 LAB — LIPASE, BLOOD: Lipase: 35 U/L (ref 11–51)

## 2021-07-14 LAB — WET PREP, GENITAL
Clue Cells Wet Prep HPF POC: NONE SEEN
Sperm: NONE SEEN
WBC, Wet Prep HPF POC: >=10 — AB (ref <10)
Yeast Wet Prep HPF POC: NONE SEEN

## 2021-07-14 LAB — PREGNANCY, URINE: Preg Test, Ur: NEGATIVE

## 2021-07-14 MED ORDER — SODIUM CHLORIDE 0.9 % IV SOLN
1.0000 g | Freq: Once | INTRAVENOUS | Status: AC
  Administered 2021-07-14: 1 g via INTRAVENOUS
  Filled 2021-07-14: qty 10

## 2021-07-14 MED ORDER — METRONIDAZOLE 500 MG PO TABS
2000.0000 mg | ORAL_TABLET | Freq: Once | ORAL | Status: AC
  Administered 2021-07-14: 2000 mg via ORAL
  Filled 2021-07-14: qty 4

## 2021-07-14 MED ORDER — FAMOTIDINE IN NACL 20-0.9 MG/50ML-% IV SOLN
20.0000 mg | Freq: Once | INTRAVENOUS | Status: AC
  Administered 2021-07-14: 20 mg via INTRAVENOUS
  Filled 2021-07-14: qty 50

## 2021-07-14 MED ORDER — DOXYCYCLINE HYCLATE 100 MG PO TABS
100.0000 mg | ORAL_TABLET | Freq: Two times a day (BID) | ORAL | 0 refills | Status: DC
  Filled 2021-07-15: qty 14, 7d supply, fill #0

## 2021-07-14 MED ORDER — MORPHINE SULFATE (PF) 4 MG/ML IV SOLN
4.0000 mg | Freq: Once | INTRAVENOUS | Status: AC
  Administered 2021-07-14: 4 mg via INTRAVENOUS
  Filled 2021-07-14: qty 1

## 2021-07-14 MED ORDER — IOHEXOL 300 MG/ML  SOLN
100.0000 mL | Freq: Once | INTRAMUSCULAR | Status: AC | PRN
  Administered 2021-07-14: 100 mL via INTRAVENOUS

## 2021-07-14 MED ORDER — ONDANSETRON HCL 4 MG/2ML IJ SOLN
4.0000 mg | Freq: Once | INTRAMUSCULAR | Status: AC
  Administered 2021-07-14: 4 mg via INTRAVENOUS
  Filled 2021-07-14: qty 2

## 2021-07-14 MED ORDER — ONDANSETRON HCL 4 MG PO TABS
4.0000 mg | ORAL_TABLET | Freq: Four times a day (QID) | ORAL | 0 refills | Status: DC
  Filled 2021-07-15: qty 12, 3d supply, fill #0

## 2021-07-14 MED ORDER — DOXYCYCLINE HYCLATE 100 MG PO TABS
100.0000 mg | ORAL_TABLET | Freq: Once | ORAL | Status: AC
  Administered 2021-07-14: 100 mg via ORAL
  Filled 2021-07-14: qty 1

## 2021-07-14 MED ORDER — ESOMEPRAZOLE MAGNESIUM 40 MG PO CPDR
40.0000 mg | DELAYED_RELEASE_CAPSULE | Freq: Every day | ORAL | 0 refills | Status: DC
  Filled 2021-07-15: qty 30, 30d supply, fill #0

## 2021-07-14 MED ORDER — SODIUM CHLORIDE 0.9 % IV BOLUS
1000.0000 mL | Freq: Once | INTRAVENOUS | Status: AC
  Administered 2021-07-14: 1000 mL via INTRAVENOUS

## 2021-07-14 MED ORDER — SUCRALFATE 1 G PO TABS
1.0000 g | ORAL_TABLET | Freq: Three times a day (TID) | ORAL | 0 refills | Status: DC
  Filled 2021-07-15: qty 60, 15d supply, fill #0

## 2021-07-14 NOTE — ED Provider Notes (Signed)
?[facility] ? ? ? ?CSN: [id number] ?Arrival date & time: 07/14/21  1031 ? ? ?  ? ?History   ?Chief Complaint ?Chief Complaint  ?Patient presents with  ? Abdominal Pain  ? Nausea  ? ? ?HPI ?Gina Shelton is a 46 y.o. female.  ? ?Patient is here for stomach pain.  It feels like an "open sore" inside her stomach.  When she drinks, eats she feels a burning.  She feels nauseated, worse with pushing her stomach.  She feels sweating, light headed.  She is unable to eat or drink anything.  ?Has been going on x 3 days.  It is getting worse.  She is at a 9/10 in pain.  ?She tried to drink some tea this morning but hurt too bad to drink.  She did eat very little last night.  ?No fevers/chills.  + sweats.  She did vomit the first day, but none since.  More gagging.  ?Normal Bms, but has stomach pain when she has a bm or urinates.  ?No urinary frequency, no burning with urination.  ?She did have a period last week.  She has been spotting the last 2 days with clear d/c.  ?She does have a h/o ulcers, and this does not feel like it.  ?She has fibroids, but this does not feel the same either.  ?She has not taken any meds for this, nor has she taken her regular medications x 4 days. Blood pressure is elevated today as a result.  ?She drinks 1 pint of etoh about 2 days/week. Other days will drink 1 beer and 1/2 pint.  ? ?Past Medical History:  ?Diagnosis Date  ? Anxiety   ? COPD (chronic obstructive pulmonary disease) ([place])   ? Depression   ? Depression   ? GERD (gastroesophageal reflux disease)   ? Gout   ? ble  ? Hypertension   ? Migraine   ? Migraine   ? ? ?Patient Active Problem List  ? Diagnosis Date Noted  ? Low back pain 04/22/2021  ? Chronic pain of left ankle 10/21/2020  ? Left foot pain 10/21/2020  ? COPD exacerbation ([place]) 09/10/2019  ? Chest pain 09/27/2017  ? Polysubstance abuse ([place]) 09/27/2017  ? Hypertensive urgency 09/26/2017  ? Obesity (BMI 30.0-34.9) 05/22/2017  ? Ovarian cyst 05/22/2017  ? Cavernous  hemangioma of liver 05/22/2017  ? Major depressive disorder, single episode, severe without psychosis ([place]) 12/31/2015  ? 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 Alcohol dependence ([place]) 06/07/2013  ? Cocaine abuse ([place]) 06/07/2013  ? DEPRESSION, MAJOR, RECURRENT 08/17/2007  ? ANXIETY STATE NOS 11/15/2006  ? ? ?Past Surgical History:  ?Procedure Laterality Date  ? CESAREAN SECTION  infection at incission requiring return to OR x 2  ? TUBAL LIGATION    ? ? ?OB History   ? ? Gravida  ?3  ? Para  ?3  ? Term  ?0  ? Preterm  ?0  ? AB  ?0  ? Living  ?   ?  ? ? SAB  ?0  ? IAB  ?0  ? Ectopic  ?0  ? Multiple  ?   ? Live Births  ?   ?   ?  ?  ? ? ? ?Home Medications   ? ?Prior to Admission medications   ?Medication Sig Start Date End Date Taking? Authorizing Provider  ?albuterol (VENTOLIN HFA) 108 (90 Base) MCG/ACT inhaler INHALE 2 PUFFS INTO THE LUNGS EVERY  6 (SIX) HOURS AS NEEDED FOR WHEEZING OR SHORTNESS OF BREATH. 02/28/20 02/27/21  [name], NP  ?albuterol (VENTOLIN HFA) 108 (90 Base) MCG/ACT inhaler Inhale 2 puffs into the lungs every 4 (four) hours as needed for wheezing or shortness of breath. 01/25/21   Hans Eden, NP  ?amLODipine (NORVASC) 10 MG tablet Take 1 tablet (10 mg total) by mouth daily. 03/31/21 03/31/22  [name], NP  ?Blood Pressure Monitor KIT 1 kit by Does not apply route 3 (three) times daily as needed. 04/04/19   [name], NP  ?buPROPion (WELLBUTRIN XL) 150 MG 24 hr tablet Take 1 tablet (150 mg total) by mouth daily. 03/31/21   [name], NP  ?colchicine 0.6 MG tablet Take 1 tablet (0.6 mg total) by mouth daily as needed (gout pain). 05/06/21   Barrett Henle, MD  ?fluticasone (FLONASE) 50 MCG/ACT nasal spray PLACE 2 SPRAYS INTO BOTH NOSTRILS DAILY. 03/31/21 03/31/22  [name], NP  ?hydrochlorothiazide (HYDRODIURIL) 25 MG tablet TAKE 1 TABLET (25 MG TOTAL) BY MOUTH DAILY. 03/31/21 03/31/22   [name], NP  ?levocetirizine (XYZAL ALLERGY 24HR) 5 MG tablet Take 1 tablet (5 mg total) by mouth every evening. 03/31/21   [name], NP  ?meloxicam (MOBIC) 15 MG tablet Take 1 tablet (15 mg total) by mouth daily. 10/21/20   Meccariello, Bernita Raisin, DO  ?tiZANidine (ZANAFLEX) 4 MG tablet Take 1 tablet (4 mg total) by mouth every 8 (eight) hours as needed for muscle spasms. 02/18/21   Melynda Ripple, MD  ? ? ?Family History ?Family History  ?Problem Relation Age of Onset  ? Hypertension Mother   ? Arthritis Sister   ? Anesthesia problems Neg Hx   ? Hypotension Neg Hx   ? Malignant hyperthermia Neg Hx   ? Pseudochol deficiency Neg Hx   ? ? ?Social History ?Social History  ? ?Tobacco Use  ? Smoking status: Every Day  ?  Packs/day: 1.00  ?  Years: 0.00  ?  Pack years: 0.00  ?  Types: Cigarettes  ? Smokeless tobacco: Never  ?Vaping Use  ? Vaping Use: Never used  ?Substance Use Topics  ? Alcohol use: Yes  ?  Comment: Consumes several beers every other day per patient stated  ? Drug use: No  ? ? ? ?Allergies   ?Patient has no known allergies. ? ? ?Review of Systems ?Review of Systems  ?Constitutional:  Positive for appetite change. Negative for fatigue and fever.  ?HENT: Negative.    ?Respiratory: Negative.    ?Cardiovascular: Negative.   ?Gastrointestinal:  Positive for abdominal pain and nausea. Negative for blood in stool, constipation, diarrhea and vomiting.  ?Endocrine: Negative.   ?Genitourinary: Negative.   ?Musculoskeletal: Negative.   ?Skin: Negative.   ? ? ?Physical Exam ?Triage Vital Signs ?ED Triage Vitals  ?Enc Vitals Group  ?   BP 07/14/21 1155 (!) 190/125  ?   Pulse Rate 07/14/21 1155 76  ?   Resp 07/14/21 1155 17  ?   Temp 07/14/21 1155 98.2 ?F (36.8 ?C)  ?   Temp Source 07/14/21 1155 Oral  ?   SpO2 07/14/21 1155 95 %  ?   Weight --   ?   Height --   ?   Head Circumference --   ?   Peak Flow --   ?   Pain Score 07/14/21 1153 9  ?   Pain Loc --   ?   Pain  Edu? --   ?   Excl. in [place]? --    ? ?No data found. ? ?Updated Vital Signs ?BP (!) 190/125 (BP Location: Right Arm) Comment: no HTN meds in a week  Pulse 76   Temp 98.2 ?F (36.8 ?C) (Oral)   Resp 17   LMP 07/08/2021   SpO2 95%  ? ?Visual Acuity ?Right Eye Distance:   ?Left Eye Distance:   ?Bilateral Distance:   ? ?Right Eye Near:   ?Left Eye Near:    ?Bilateral Near:    ? ?Physical Exam ?Constitutional:   ?   General: She is in acute distress.  ?   Appearance: She is well-developed. She is ill-appearing.  ?HENT:  ?   Head: Normocephalic.  ?Pulmonary:  ?   Effort: Pulmonary effort is normal.  ?   Breath sounds: Normal breath sounds.  ?Abdominal:  ?   General: Abdomen is protuberant. Bowel sounds are normal.  ?   Palpations: Abdomen is rigid.  ?   Tenderness: There is generalized abdominal tenderness.  ?   Comments: Patient is very TTP with light palpation throughout the abdomen;  feels the need to vomit with palpation  ?Skin: ?   General: Skin is warm.  ?Neurological:  ?   General: No focal deficit present.  ?   Mental Status: She is alert.  ? ? ? ?UC Treatments / Results  ?Labs ?(all labs ordered are listed, but only abnormal results are displayed) ?Labs Reviewed - No data to display ? ?EKG ? ? ?Radiology ?No results found. ? ?Procedures ?Procedures (including critical care time) ? ?Medications Ordered in UC ?Medications - No data to display ? ?Initial Impression / Assessment and Plan / UC Course  ?I have reviewed the triage vital signs and the nursing notes. ? ?Pertinent labs & imaging results that were available during my care of the patient were reviewed by me and considered in my medical decision making (see chart for details). ? ?Patient is here with severe abd pain, nausea, unable to eat/drink anything for several days.  Has not taken her medications for 4 days with very elevated bp.  ?I am worried about pancreatitis given her etoh intake, or possible ulceration, but level of pain is severe.  ?She is agreeable to go to the ER today.  She  has called her daughter to take her.  ? ?Final Clinical Impressions(s) / UC Diagnoses  ? ?Final diagnoses:  ?Nausea  ?Generalized abdominal pain  ? ? ? ?Discharge Instructions   ? ?  ?You were seen today for sever

## 2021-07-14 NOTE — ED Notes (Signed)
Patient is being discharged from the Urgent Care and sent to the Emergency Department via POV with family . Per Dr Eustace Moore, patient is in need of higher level of care due to abdominal pain and nausea. Patient is aware and verbalizes understanding of plan of care.  ?Vitals:  ? 07/14/21 1155  ?BP: (!) 190/125  ?Pulse: 76  ?Resp: 17  ?Temp: 98.2 ?F (36.8 ?C)  ?SpO2: 95%  ?  ?

## 2021-07-14 NOTE — Discharge Instructions (Signed)
You likely have gastritis causing your symptoms. ? ?Take Zofran for nausea and stay hydrated. ? ?Take Nexium daily and avoid any spicy food ? ?Take Carafate 4 times daily with meals ? ?See GI for follow-up. ? ?You also have a hemangioma in your liver that can be monitored with a GI doctor. ? ?Moreover, you have urinary tract infection and I ordered doxycycline. ? ?You were treated for trichomonas.  You can see your gonorrhea and Chlamydia results on MyChart.  Please use condoms and notify your sexual partner ? ?Return to ER if you have worse abdominal pain, vomiting, fever, dehydration ?

## 2021-07-14 NOTE — ED Provider Notes (Signed)
?[facility] ?Provider Note ? ? ?CSN: [id number] ?Arrival date & time: 07/14/21  1312 ? ?  ? ?History ? ?Chief Complaint  ?Patient presents with  ? Abdominal Pain  ? ? ?Gina Shelton is a 46 y.o. female here presenting with abdominal pain and vaginal discharge.  Patient states that she has been having vaginal discharge for the last 2 to 3 days.  Patient also has diffuse abdominal pain as well.  Patient states that she is sexually active and did have unprotected sex several days ago.  She states that she knows this partner.  Patient went to urgent care and was noted to have diffuse tenderness and was sent here for further evaluation.  Patient states that for the last several days, whenever she eats something she has burning sensation in her stomach as well.  She states that she has a hard time keeping things down due to pain. ? ?The history is provided by the patient.  ? ?  ? ?Home Medications ?Prior to Admission medications   ?Medication Sig Start Date End Date Taking? Authorizing Provider  ?albuterol (VENTOLIN HFA) 108 (90 Base) MCG/ACT inhaler INHALE 2 PUFFS INTO THE LUNGS EVERY 6 (SIX) HOURS AS NEEDED FOR WHEEZING OR SHORTNESS OF BREATH. 02/28/20 02/27/21  [name], NP  ?albuterol (VENTOLIN HFA) 108 (90 Base) MCG/ACT inhaler Inhale 2 puffs into the lungs every 4 (four) hours as needed for wheezing or shortness of breath. 01/25/21   Hans Eden, NP  ?amLODipine (NORVASC) 10 MG tablet Take 1 tablet (10 mg total) by mouth daily. 03/31/21 03/31/22  [name], NP  ?Blood Pressure Monitor KIT 1 kit by Does not apply route 3 (three) times daily as needed. 04/04/19   [name], NP  ?buPROPion (WELLBUTRIN XL) 150 MG 24 hr tablet Take 1 tablet (150 mg total) by mouth daily. 03/31/21   [name], NP  ?colchicine 0.6 MG tablet Take 1 tablet (0.6 mg total) by mouth daily as needed (gout pain). 05/06/21   Barrett Henle, MD  ?fluticasone  (FLONASE) 50 MCG/ACT nasal spray PLACE 2 SPRAYS INTO BOTH NOSTRILS DAILY. 03/31/21 03/31/22  [name], NP  ?hydrochlorothiazide (HYDRODIURIL) 25 MG tablet TAKE 1 TABLET (25 MG TOTAL) BY MOUTH DAILY. 03/31/21 03/31/22  [name], NP  ?levocetirizine (XYZAL ALLERGY 24HR) 5 MG tablet Take 1 tablet (5 mg total) by mouth every evening. 03/31/21   [name], NP  ?meloxicam (MOBIC) 15 MG tablet Take 1 tablet (15 mg total) by mouth daily. 10/21/20   Meccariello, Bernita Raisin, DO  ?tiZANidine (ZANAFLEX) 4 MG tablet Take 1 tablet (4 mg total) by mouth every 8 (eight) hours as needed for muscle spasms. 02/18/21   Melynda Ripple, MD  ?   ? ?Allergies    ?Patient has no known allergies.   ? ?Review of Systems   ?Review of Systems  ?Gastrointestinal:  Positive for abdominal pain and vomiting.  ?All other systems reviewed and are negative. ? ?Physical Exam ?Updated Vital Signs ?BP (!) 186/112   Pulse 75   Temp 98.4 ?F (36.9 ?C) (Oral)   Resp 12   Ht '5\' 2"'  (1.575 m)   Wt 78 kg   LMP 07/08/2021   SpO2 99%   BMI 31.46 kg/m?  ?Physical Exam ?Vitals and nursing note reviewed.  ?Constitutional:   ?   Comments: Uncomfortable, vomiting  ?HENT:  ?   Head: Normocephalic.  ?Eyes:  ?  Extraocular Movements: Extraocular movements intact.  ?Cardiovascular:  ?   Rate and Rhythm: Normal rate and regular rhythm.  ?   Heart sounds: Normal heart sounds.  ?Pulmonary:  ?   Effort: Pulmonary effort is normal.  ?   Breath sounds: Normal breath sounds.  ?Abdominal:  ?   General: Abdomen is flat.  ?   Comments: Mild diffuse tenderness worse in the epigastrium  ?Genitourinary: ?   Comments: Patient has yellowish discharge on pelvic exam.  No obvious CMT but patient has diffuse adnexal tenderness.  No obvious uterine tenderness ?Skin: ?   Capillary Refill: Capillary refill takes less than 2 seconds.  ?Neurological:  ?   General: No focal deficit present.  ?   Mental Status: She is alert and oriented to person, place, and  time.  ?Psychiatric:     ?   Mood and Affect: Mood normal.     ?   Behavior: Behavior normal.  ? ? ?ED Results / Procedures / Treatments   ?Labs ?(all labs ordered are listed, but only abnormal results are displayed) ?Labs Reviewed  ?WET PREP, GENITAL - Abnormal; Notable for the following components:  ?    Result Value  ? Trich, Wet Prep PRESENT (*)   ? WBC, Wet Prep HPF POC >=10 (*)   ? All other components within normal limits  ?CBC WITH DIFFERENTIAL/PLATELET - Abnormal; Notable for the following components:  ? RDW 16.3 (*)   ? All other components within normal limits  ?URINALYSIS, ROUTINE W REFLEX MICROSCOPIC - Abnormal; Notable for the following components:  ? APPearance CLOUDY (*)   ? Hgb urine dipstick MODERATE (*)   ? Leukocytes,Ua LARGE (*)   ? Bacteria, UA RARE (*)   ? Trichomonas, UA PRESENT (*)   ? All other components within normal limits  ?COMPREHENSIVE METABOLIC PANEL  ?LIPASE, BLOOD  ?PREGNANCY, URINE  ?GC/CHLAMYDIA PROBE AMP ([place]) NOT AT [facility]  ? ? ?EKG ?None ? ?Radiology ?US Transvaginal Non-OB ? ?Result Date: 07/14/2021 ?CLINICAL DATA:  Pelvic pain EXAM: TRANSABDOMINAL AND TRANSVAGINAL ULTRASOUND OF PELVIS DOPPLER ULTRASOUND OF OVARIES TECHNIQUE: Both transabdominal and transvaginal ultrasound examinations of the pelvis were performed. Transabdominal technique was performed for global imaging of the pelvis including uterus, ovaries, adnexal regions, and pelvic cul-de-sac. It was necessary to proceed with endovaginal exam following the transabdominal exam to visualize the endometrium and ovaries. Color and duplex Doppler ultrasound was utilized to evaluate blood flow to the ovaries. COMPARISON:  05/22/2017 FINDINGS: Uterus Measurements: 8.9 x 5.1 x 5.9 cm = volume: 139.4 mL. There is slightly inhomogeneous echogenicity in myometrium. There is 1.5 x 1.4 x 1.8 cm hypoechoic structure in the fundus suggesting fibroid. Uterus is retroverted. Endometrium Thickness: 5 mm.  No focal abnormality  visualized. Right ovary Measurements: 2.8 x 2.1 x 1.6 cm = volume: 4.92 mL. There is 1.5 cm follicle. Left ovary Measurements: 2.9 x 1.9 x 2.4 cm = volume: 6.89 mL. There is a 2 cm simple appearing cystic structure in the left adnexa, possibly a dominant follicle or functional cyst. Pulsed Doppler evaluation of both ovaries demonstrates normal low-resistance arterial and venous waveforms. Color Doppler examination shows demonstrable vascular flow in both adnexal regions. Other findings No abnormal free fluid. IMPRESSION: Uterus is retroverted. There is 1.8 cm hypoechoic structure in the fundus suggesting fibroid. 2 cm simple appearing cyst in the left adnexa may suggest a dominant follicle or functional cyst. Pelvic sonogram is otherwise unremarkable. Electronically Signed   By: Elmer Picker  M.D.   On: 07/14/2021 17:56  ? ?US Pelvis Complete ? ?Result Date: 07/14/2021 ?CLINICAL DATA:  Pelvic pain EXAM: TRANSABDOMINAL AND TRANSVAGINAL ULTRASOUND OF PELVIS DOPPLER ULTRASOUND OF OVARIES TECHNIQUE: Both transabdominal and transvaginal ultrasound examinations of the pelvis were performed. Transabdominal technique was performed for global imaging of the pelvis including uterus, ovaries, adnexal regions, and pelvic cul-de-sac. It was necessary to proceed with endovaginal exam following the transabdominal exam to visualize the endometrium and ovaries. Color and duplex Doppler ultrasound was utilized to evaluate blood flow to the ovaries. COMPARISON:  05/22/2017 FINDINGS: Uterus Measurements: 8.9 x 5.1 x 5.9 cm = volume: 139.4 mL. There is slightly inhomogeneous echogenicity in myometrium. There is 1.5 x 1.4 x 1.8 cm hypoechoic structure in the fundus suggesting fibroid. Uterus is retroverted. Endometrium Thickness: 5 mm.  No focal abnormality visualized. Right ovary Measurements: 2.8 x 2.1 x 1.6 cm = volume: 4.92 mL. There is 1.5 cm follicle. Left ovary Measurements: 2.9 x 1.9 x 2.4 cm = volume: 6.89 mL. There is a 2  cm simple appearing cystic structure in the left adnexa, possibly a dominant follicle or functional cyst. Pulsed Doppler evaluation of both ovaries demonstrates normal low-resistance arterial and venous waveforms. Color Dop

## 2021-07-14 NOTE — Discharge Instructions (Addendum)
You were seen today for severe abdominal pain and nausea.  ?Given you level of pain you need to go to the ER for further testing and evaluation.  ?

## 2021-07-14 NOTE — ED Notes (Signed)
Discharge instructions reviewed with patient. Patient verbalized understanding of instructions. Follow-up care and medications were reviewed. Patient ambulatory with steady gait. VSS upon discharge.  ?

## 2021-07-14 NOTE — ED Triage Notes (Signed)
Pt c/o abd pains with nausea for 3 days. ABD Pains with urinating and having BM. Pt reports after her cycle has clear discharge.  ?

## 2021-07-14 NOTE — ED Provider Triage Note (Signed)
Emergency Medicine Provider Triage Evaluation Note ? ?Gina Shelton , a 46 y.o. female  was evaluated in triage.  Pt complains of patient complains of generalized abdominal pain.  Patient sent here by urgent care for further evaluation of abdominal pain.  Patient states that she has a generalized burning feeling throughout her abdomen when she eats or drinks.  States she is unable to eat or drink due to pain.  Patient denies recent illness.  Denies constipation or diarrhea.  Does endorse recent bowel movements.  Denies frank nausea but feels that she may vomit due to pain when eating.  Patient's last menstrual cycle was last week.  Patient does have history of tubal ligation.  Denies chest pain, shortness of breath.  Patient also endorses vaginal spotting and clear discharge noted today ? ?Review of Systems  ?Positive: Abdominal pain, vomiting ?Negative: Chest pain, shortness of breath ? ?Physical Exam  ?BP (!) 190/114   Pulse 83   Temp 98.4 ?F (36.9 ?C) (Oral)   Resp 16   LMP 07/08/2021   SpO2 100%  ?Gen:   Awake, no distress   ?Resp:  Normal effort  ?MSK:   Moves extremities without difficulty  ?Other:  Generalized tenderness to palpation over abdomen, no quadrant more severe than others ? ?Medical Decision Making  ?Medically screening exam initiated at 1:28 PM.  Appropriate orders placed.  Gina Shelton was informed that the remainder of the evaluation will be completed by another provider, this initial triage assessment does not replace that evaluation, and the importance of remaining in the ED until their evaluation is complete. ? ? ?  ?Dorothyann Peng, PA-C ?07/14/21 1333 ? ?

## 2021-07-14 NOTE — ED Triage Notes (Signed)
Pt arrives via POV from UC with 3 days generalized abdominal pain and distension. Pt reports constant nausea, no appetite and pain when eating. Pt with history of ulcers, csections, fibroids. LMP Thursday. Pt reports blood vaginal discharge. Pain 9/10.  ?

## 2021-07-15 ENCOUNTER — Other Ambulatory Visit: Payer: Self-pay

## 2021-07-15 LAB — GC/CHLAMYDIA PROBE AMP (~~LOC~~) NOT AT ARMC
Chlamydia: NEGATIVE
Comment: NEGATIVE
Comment: NORMAL
Neisseria Gonorrhea: NEGATIVE

## 2021-08-15 ENCOUNTER — Ambulatory Visit (HOSPITAL_COMMUNITY)
Admission: EM | Admit: 2021-08-15 | Discharge: 2021-08-15 | Disposition: A | Discharge status: Home / Self Care | Payer: Medicaid Other | Attending: Student | Admitting: Student

## 2021-08-15 ENCOUNTER — Encounter (HOSPITAL_COMMUNITY): Payer: Self-pay | Admitting: Emergency Medicine

## 2021-08-15 DIAGNOSIS — M5386 Other specified dorsopathies, lumbar region: Secondary | ICD-10-CM | POA: Diagnosis not present

## 2021-08-15 MED ORDER — KETOROLAC TROMETHAMINE 30 MG/ML IJ SOLN
INTRAMUSCULAR | Status: AC
  Filled 2021-08-15: qty 1

## 2021-08-15 MED ORDER — METHYLPREDNISOLONE 4 MG PO TBPK
ORAL_TABLET | ORAL | 0 refills | Status: DC
Start: 2021-08-15 — End: 2022-09-22

## 2021-08-15 MED ORDER — KETOROLAC TROMETHAMINE 30 MG/ML IJ SOLN
30.0000 mg | Freq: Once | INTRAMUSCULAR | Status: AC
  Administered 2021-08-15: 30 mg via INTRAMUSCULAR

## 2021-08-15 MED ORDER — TIZANIDINE HCL 2 MG PO TABS: 2.0000 mg | ORAL_TABLET | Freq: Three times a day (TID) | ORAL | 0 refills | Status: DC | PRN

## 2021-08-15 NOTE — Discharge Instructions (Addendum)
-  Start the muscle relaxer-Zanaflex (tizanidine), up to 3 times daily for muscle spasms and pain.  This can make you drowsy, so take at bedtime or when you do not need to drive or operate machinery. -Medrol dosepack - follow instructions on box. Take with food if sensitive stomach.  -Avoid ibuprofen the rest of the day. If you take ibuprofen, take it with food. Limit ibuprofen while taking the steroid.  -Follow-up with orthopedist if symptoms persist

## 2021-08-15 NOTE — ED Triage Notes (Signed)
Pt reports that she has issues with her back and Medicaid won't pay for an MRI. Reports for past few days pain is worse and radiating down her right leg. Denies falls, lifting, injuries. Reports did get a second job recently.

## 2021-08-15 NOTE — ED Provider Notes (Signed)
[place]    CSN: [id number] Arrival date & time: 08/15/21  1424      History   Chief Complaint Chief Complaint  Patient presents with   Back Pain   Leg Pain    HPI Gina Shelton is a 46 y.o. female presenting with acute exacerbation of chronic back pain.  History of degenerative disc disease, polysubstance abuse.  Patient states that she has chronic back pain, sometimes associated with right-sided sciatica.  She states she has an active job, and yesterday at work the pain became so significant she had to leave early.  The symptoms are typical for her, she denies any new weakness or sensation changes in the legs, denies urinary symptoms.  She has attempted heating pad only for relief.  She is followed closely by Ortho, states they want to get an MRI but Medicaid will not pay for this.  HPI  Past Medical History:  Diagnosis Date   Anxiety    COPD (chronic obstructive pulmonary disease) ([place])    Depression    Depression    GERD (gastroesophageal reflux disease)    Gout    ble   Hypertension    Migraine    Migraine     Patient Active Problem List   Diagnosis Date Noted   Low back pain 04/22/2021   Chronic pain of left ankle 10/21/2020   Left foot pain 10/21/2020   COPD exacerbation ([place]) 09/10/2019   Chest pain 09/27/2017   Polysubstance abuse ([place]) 09/27/2017   Hypertensive urgency 09/26/2017   Obesity (BMI 30.0-34.9) 05/22/2017   Ovarian cyst 05/22/2017   Cavernous hemangioma of liver 05/22/2017   Major depressive disorder, single episode, severe without psychosis ([place]) 12/31/2015   Major depressive disorder, 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 Authorizing Provider  methylPREDNISolone (MEDROL DOSEPAK) 4 MG TBPK tablet Follow instructions on box 08/15/21  Yes Hazel Sams, PA-C  tiZANidine (ZANAFLEX) 2 MG tablet Take 1 tablet (2 mg total) by mouth every 8 (eight) hours as needed for muscle spasms. 08/15/21  Yes Hazel Sams, PA-C  albuterol (VENTOLIN HFA) 108 (90 Base) MCG/ACT inhaler INHALE 2 PUFFS INTO THE LUNGS EVERY 6 (SIX) HOURS AS NEEDED FOR WHEEZING OR SHORTNESS OF BREATH. 02/28/20 02/27/21  [name], NP  albuterol (VENTOLIN HFA) 108 (90 Base) MCG/ACT inhaler Inhale 2 puffs into the lungs every 4 (four) hours as needed for wheezing or shortness of breath. 01/25/21   White, Leitha Schuller, NP  amLODipine (NORVASC) 10 MG tablet Take 1 tablet (10 mg total) by mouth daily. 03/31/21 03/31/22  [name], NP  Blood Pressure Monitor KIT 1 kit by Does not apply route 3 (three) times daily as needed. 04/04/19   [name], NP  buPROPion (WELLBUTRIN XL) 150 MG 24 hr tablet Take 1 tablet (150 mg total) by mouth daily. 03/31/21   [name], NP  colchicine 0.6 MG tablet Take 1 tablet (0.6 mg total) by mouth daily as needed (gout pain). 05/06/21   Barrett Henle, MD  doxycycline (VIBRA-TABS) 100 MG tablet Take 1 tablet (100 mg total) by mouth 2 (two) times daily for 7 days. 07/14/21   Drenda Freeze, MD  esomeprazole (NEXIUM) 40 MG capsule Take 1 capsule (40 mg total) by mouth daily. 07/14/21   Drenda Freeze, MD  fluticasone (FLONASE) 50 MCG/ACT nasal spray PLACE 2 SPRAYS INTO BOTH NOSTRILS DAILY. 03/31/21 03/31/22  [name], NP  hydrochlorothiazide (HYDRODIURIL) 25 MG tablet TAKE 1 TABLET (25 MG TOTAL) BY  MOUTH DAILY. 03/31/21 03/31/22  [name], NP  levocetirizine (XYZAL ALLERGY 24HR) 5 MG tablet Take 1 tablet (5 mg total) by mouth every evening. 03/31/21   [name], NP  meloxicam (MOBIC) 15 MG tablet Take 1 tablet (15 mg total) by mouth daily. 10/21/20   Meccariello, Bernita Raisin, DO  ondansetron (ZOFRAN) 4 MG tablet Take 1 tablet (4 mg total) by mouth every 6 (six) hours. 07/14/21   Drenda Freeze, MD  sucralfate (CARAFATE) 1 g tablet Take 1 tablet (1 g total) by mouth 4 (four) times daily -  with meals and at bedtime. 07/14/21   Drenda Freeze, MD    Family History Family History  Problem Relation Age of Onset   Hypertension Mother    Arthritis Sister    Anesthesia problems Neg Hx    Hypotension Neg Hx    Malignant hyperthermia Neg Hx    Pseudochol deficiency Neg Hx     Social History Social History   Tobacco Use   Smoking status: Every Day    Packs/day: 1.00    Years: 0.00    Pack years: 0.00    Types: Cigarettes   Smokeless tobacco: Never  Vaping Use   Vaping Use: Never used  Substance Use Topics   Alcohol use: Yes    Comment: Consumes several beers every other day per patient stated   Drug use: No     Allergies   Patient has no known allergies.   Review of Systems Review of Systems  Musculoskeletal:  Positive for back pain.  All other systems reviewed and are negative.   Physical Exam Triage Vital Signs ED Triage Vitals  Enc Vitals Group     BP 08/15/21 1501 (!) 135/92     Pulse Rate 08/15/21 1501 94     Resp 08/15/21 1501 19     Temp 08/15/21 1501 98.4 F (36.9 C)     Temp Source 08/15/21 1501 Oral     SpO2 08/15/21 1501 98 %     Weight --      Height --      Head Circumference --      Peak Flow --      Pain Score 08/15/21 1500 10     Pain Loc --      Pain Edu? --      Excl. in [place]? --    No data found.  Updated Vital Signs BP (!) 135/92 (BP Location: Left Arm)   Pulse 94   Temp 98.4 F (36.9 C) (Oral)   Resp  19   LMP  08/08/2021   SpO2 98%   Visual Acuity Right Eye Distance:   Left Eye Distance:   Bilateral Distance:    Right Eye Near:   Left Eye Near:    Bilateral Near:     Physical Exam Vitals reviewed.  Constitutional:      General: She is not in acute distress.    Appearance: Normal appearance. She is not ill-appearing.  HENT:     Head: Normocephalic and atraumatic.  Cardiovascular:     Rate and Rhythm: Normal rate and regular rhythm.     Heart sounds: Normal heart sounds.  Pulmonary:     Effort: Pulmonary effort is normal.     Breath sounds: Normal breath sounds and air entry.  Abdominal:     Tenderness: There is no abdominal tenderness. There is no right CVA tenderness, left CVA tenderness, guarding or rebound.  Musculoskeletal:     Cervical back: Normal range of motion. No swelling, deformity, signs of trauma, rigidity, spasms, tenderness, bony tenderness or crepitus. No pain with movement.     Thoracic back: No swelling, deformity, signs of trauma, spasms, tenderness or bony tenderness. Normal range of motion. No scoliosis.     Lumbar back: Spasms and tenderness present. No swelling, deformity, signs of trauma or bony tenderness. Normal range of motion. Negative right straight leg raise test and negative left straight leg raise test. No scoliosis.     Comments: There is right sided lumbar paraspinous muscle tenderness and hypertonicity to palpation.  Pain elicited with both flexion and extension lumbar spine.  There is also right-sided sciatica elicited with extension lumbar spine, positive right straight leg raise.  Strength is 4/5 right leg, 5/5 left leg.  No saddle anesthesia.  Gait intact but with pain. No midline spinous tenderness, deformity, stepoff.  Absolutely no other injury, deformity, tenderness, ecchymosis, abrasion.  Neurological:     General: No focal deficit present.     Mental Status: She is alert.     Cranial Nerves: No cranial nerve deficit.  Psychiatric:         Mood and Affect: Mood normal.        Behavior: Behavior normal.        Thought Content: Thought content normal.        Judgment: Judgment normal.     UC Treatments / Results  Labs (all labs ordered are listed, but only abnormal results are displayed) Labs Reviewed - No data to display  EKG   Radiology No results found.  Procedures Procedures (including critical care time)  Medications Ordered in UC Medications  ketorolac (TORADOL) 30 MG/ML injection 30 mg (30 mg Intramuscular Given 08/15/21 1527)    Initial Impression / Assessment and Plan / UC Course  I have reviewed the triage vital signs and the nursing notes.  Pertinent labs & imaging results that were available during my care of the patient were reviewed by me and considered in my medical decision making (see chart for details).     This patient is a very pleasant 46 y.o. year old female presenting with acute exacerbation of chronic back pain. No recent trauma or falls. She has a history of sciatica; no new red flag or radicular symptoms. She is already followed by ortho for this issue. LMP 08/08/21, States she is not pregnant or breastfeeding.  History gastritis, so avoid ibuprofen as much as possible. Creatinine and GFR in normal range 07/14/21. She has not taken any today. IM toradol administered  during visit. Medrol dosepack and zanaflex sent. Continue heating pad. F/u with ortho if symptoms persist.   ED return precautions discussed. Patient verbalizes understanding and agreement.     Final Clinical Impressions(s) / UC Diagnoses   Final diagnoses:  Sciatica of right side associated with disorder of lumbar spine     Discharge Instructions      -Start the muscle relaxer-Zanaflex (tizanidine), up to 3 times daily for muscle spasms and pain.  This can make you drowsy, so take at bedtime or when you do not need to drive or operate machinery. -Medrol dosepack - follow instructions on box. Take with food if sensitive  stomach.  -Avoid ibuprofen the rest of the day. If you take ibuprofen, take it with food. Limit ibuprofen while taking the steroid.  -Follow-up with orthopedist if symptoms persist   ED Prescriptions     Medication Sig Dispense Auth. Provider   methylPREDNISolone (MEDROL DOSEPAK) 4 MG TBPK tablet Follow instructions on box 1 each Hazel Sams, PA-C   tiZANidine (ZANAFLEX) 2 MG tablet Take 1 tablet (2 mg total) by mouth every 8 (eight) hours as needed for muscle spasms. 21 tablet Hazel Sams, PA-C      I have reviewed the PDMP during this encounter.   Hazel Sams, PA-C 08/15/21 1538

## 2021-09-07 ENCOUNTER — Encounter (HOSPITAL_COMMUNITY): Payer: Self-pay

## 2021-09-07 ENCOUNTER — Other Ambulatory Visit: Payer: Self-pay

## 2021-09-07 ENCOUNTER — Emergency Department (HOSPITAL_COMMUNITY)
Admission: EM | Admit: 2021-09-07 | Discharge: 2021-09-08 | Disposition: A | Discharge status: Home / Self Care | Payer: Medicaid Other | Attending: Emergency Medicine | Admitting: Emergency Medicine

## 2021-09-07 DIAGNOSIS — J449 Chronic obstructive pulmonary disease, unspecified: Secondary | ICD-10-CM | POA: Diagnosis not present

## 2021-09-07 DIAGNOSIS — Z79899 Other long term (current) drug therapy: Secondary | ICD-10-CM | POA: Insufficient documentation

## 2021-09-07 DIAGNOSIS — I10 Essential (primary) hypertension: Secondary | ICD-10-CM | POA: Diagnosis not present

## 2021-09-07 DIAGNOSIS — R531 Weakness: Secondary | ICD-10-CM | POA: Insufficient documentation

## 2021-09-07 LAB — BASIC METABOLIC PANEL
Anion gap: 7 (ref 5–15)
BUN: 9 mg/dL (ref 6–20)
CO2: 23 mmol/L (ref 22–32)
Calcium: 8.6 mg/dL — ABNORMAL LOW (ref 8.9–10.3)
Chloride: 114 mmol/L — ABNORMAL HIGH (ref 98–111)
Creatinine, Ser: 0.57 mg/dL (ref 0.44–1.00)
GFR, Estimated: >60 mL/min (ref >60)
Glucose, Bld: 80 mg/dL (ref 70–99)
Potassium: 3.1 mmol/L — ABNORMAL LOW (ref 3.5–5.1)
Sodium: 144 mmol/L (ref 135–145)

## 2021-09-07 LAB — CBC
HCT: 33.2 % — ABNORMAL LOW (ref 36.0–46.0)
Hemoglobin: 10.6 g/dL — ABNORMAL LOW (ref 12.0–15.0)
MCH: 29.1 pg (ref 26.0–34.0)
MCHC: 31.9 g/dL (ref 30.0–36.0)
MCV: 91.2 fL (ref 80.0–100.0)
Platelets: 281 10*3/uL (ref 150–400)
RBC: 3.64 MIL/uL — ABNORMAL LOW (ref 3.87–5.11)
RDW: 16.3 % — ABNORMAL HIGH (ref 11.5–15.5)
WBC: 6.5 10*3/uL (ref 4.0–10.5)
nRBC: 0 % (ref 0.0–0.2)

## 2021-09-07 LAB — CBG MONITORING, ED: Glucose-Capillary: 96 mg/dL (ref 70–99)

## 2021-09-07 MED ORDER — SODIUM CHLORIDE 0.9 % IV BOLUS
1000.0000 mL | Freq: Once | INTRAVENOUS | Status: AC
  Administered 2021-09-08: 1000 mL via INTRAVENOUS

## 2021-09-07 NOTE — ED Provider Notes (Signed)
[facility] DEPT Provider Note   CSN: [id number] Arrival date & time: 09/07/21  2212     History  Chief Complaint  Patient presents with   Weakness    Gina Shelton is a 46 y.o. female.  Patient is a 46 year old female with past medical history of EtOH abuse, polysubstance abuse, COPD, hypertension.  Patient presenting today with complaints of weakness and feeling generally unwell.  This started earlier today.  She reports vaping a THC product just before the onset of symptoms.  She also reports consuming less alcohol today than she normally denies.  She denies any fevers or chills.  She denies any diarrhea or vomiting.  She denies any urinary complaints.  The history is provided by the patient.  Weakness Severity:  Moderate Onset quality:  Sudden Duration:  12 hours Timing:  Constant Progression:  Improving Chronicity:  New Relieved by:  Nothing Worsened by:  Nothing      Home Medications Prior to Admission medications   Medication Sig Start Date End Date Taking? Authorizing Provider  albuterol (VENTOLIN HFA) 108 (90 Base) MCG/ACT inhaler INHALE 2 PUFFS INTO THE LUNGS EVERY 6 (SIX) HOURS AS NEEDED FOR WHEEZING OR SHORTNESS OF BREATH. 02/28/20 02/27/21  [name], NP  albuterol (VENTOLIN HFA) 108 (90 Base) MCG/ACT inhaler Inhale 2 puffs into the lungs every 4 (four) hours as needed for wheezing or shortness of breath. 01/25/21   White, Leitha Schuller, NP  amLODipine (NORVASC) 10 MG tablet Take 1 tablet (10 mg total) by mouth daily. 03/31/21 03/31/22  [name], NP  Blood Pressure Monitor KIT 1 kit by Does not apply route 3 (three) times daily as needed. 04/04/19   [name], NP  buPROPion (WELLBUTRIN XL) 150 MG 24 hr tablet Take 1 tablet (150 mg total) by mouth daily. 03/31/21   [name], NP  colchicine 0.6 MG tablet Take 1 tablet (0.6 mg total) by mouth daily as needed (gout pain). 05/06/21   Barrett Henle, MD  doxycycline (VIBRA-TABS) 100 MG tablet Take 1 tablet (100 mg total) by mouth 2 (two) times daily for 7 days. 07/14/21   Drenda Freeze, MD  esomeprazole (NEXIUM) 40 MG capsule Take 1 capsule (40 mg total) by mouth daily. 07/14/21   Drenda Freeze, MD  fluticasone (FLONASE) 50 MCG/ACT nasal spray PLACE 2 SPRAYS INTO BOTH NOSTRILS DAILY. 03/31/21 03/31/22  [name], NP  hydrochlorothiazide (HYDRODIURIL) 25 MG tablet TAKE 1 TABLET (25 MG TOTAL) BY MOUTH DAILY. 03/31/21 03/31/22  [name], NP  levocetirizine (XYZAL ALLERGY 24HR) 5 MG tablet Take 1 tablet (5 mg total) by mouth every evening. 03/31/21   [name], NP  meloxicam (MOBIC) 15 MG tablet Take 1 tablet (15 mg total) by mouth daily. 10/21/20   Meccariello, Bernita Raisin, DO  methylPREDNISolone (MEDROL DOSEPAK) 4 MG TBPK tablet Follow instructions on box 08/15/21   Hazel Sams, PA-C  ondansetron (ZOFRAN) 4 MG tablet Take 1 tablet (4 mg total) by mouth every 6 (six) hours. 07/14/21   Drenda Freeze, MD  sucralfate (CARAFATE) 1 g tablet Take 1 tablet (1 g total) by mouth 4 (four) times daily -  with meals and at bedtime. 07/14/21   Drenda Freeze, MD  tiZANidine (ZANAFLEX) 2 MG tablet Take 1 tablet (2 mg total) by mouth every 8 (eight) hours as needed for muscle spasms. 08/15/21   Hazel Sams, PA-C  Allergies    Patient has no known allergies.    Review of Systems   Review of Systems  Neurological:  Positive for weakness.  All other systems reviewed and are negative.   Physical Exam Updated Vital Signs BP (!) 149/85   Pulse 65   Temp 97.6 F (36.4 C) (Rectal)   Resp 16   Ht $R'5\' 2"'yM$  (1.575 m)   Wt 78 kg   LMP 08/08/2021   SpO2 100%   BMI 31.45 kg/m  Physical Exam Vitals and nursing note reviewed.  Constitutional:      General: She is not in acute distress.    Appearance: She is well-developed. She is not diaphoretic.  HENT:     Head: Normocephalic and atraumatic.     Mouth/Throat:      Mouth: Mucous membranes are moist.     Pharynx: No oropharyngeal exudate or posterior oropharyngeal erythema.  Eyes:     Extraocular Movements: Extraocular movements intact.     Pupils: Pupils are equal, round, and reactive to light.  Cardiovascular:     Rate and Rhythm: Normal rate and regular rhythm.     Heart sounds: No murmur heard.    No friction rub. No gallop.  Pulmonary:     Effort: Pulmonary effort is normal. No respiratory distress.     Breath sounds: Normal breath sounds. No wheezing.  Abdominal:     General: Bowel sounds are normal. There is no distension.     Palpations: Abdomen is soft.     Tenderness: There is no abdominal tenderness.  Musculoskeletal:        General: Normal range of motion.     Cervical back: Normal range of motion and neck supple.  Skin:    General: Skin is warm and dry.  Neurological:     General: No focal deficit present.     Mental Status: She is alert and oriented to person, place, and time.     Cranial Nerves: No cranial nerve deficit.     Sensory: No sensory deficit.     Motor: No weakness.     Coordination: Coordination normal.     ED Results / Procedures / Treatments   Labs (all labs ordered are listed, but only abnormal results are displayed) Labs Reviewed  BASIC METABOLIC PANEL - Abnormal; Notable for the following components:      Result Value   Potassium 3.1 (*)    Chloride 114 (*)    Calcium 8.6 (*)    All other components within normal limits  CBC - Abnormal; Notable for the following components:   RBC 3.64 (*)    Hemoglobin 10.6 (*)    HCT 33.2 (*)    RDW 16.3 (*)    All other components within normal limits  URINALYSIS, ROUTINE W REFLEX MICROSCOPIC  ETHANOL  CBG MONITORING, ED    EKG EKG Interpretation  Date/Time:  Tuesday September 07 2021 23:22:16 EDT Ventricular Rate:  76 PR Interval:  132 QRS Duration: 91 QT Interval:  420 QTC Calculation: 473 R Axis:   39 Text Interpretation: Sinus rhythm Borderline low  voltage, extremity leads No significant change since 07/14/2021 Confirmed by Veryl Speak [id number]) on 09/07/2021 11:24:54 PM  Radiology No results found.  Procedures Procedures    Medications Ordered in ED Medications  sodium chloride 0.9 % bolus 1,000 mL (has no administration in time range)    ED Course/ Medical Decision Making/ A&P  Patient presenting with complaints of weakness and fatigue.  This  started earlier this evening and shortly after using a THC vape which she has never done before.  I suspect her symptoms are a side effect of this as nothing else in her work-up is abnormal.  Laboratory studies are all reassuring and vital signs are stable.  Patient has been observed here for several hours and is now feeling better.  I feel as though she can safely be discharged with outpatient follow-up and as needed return.  Final Clinical Impression(s) / ED Diagnoses Final diagnoses:  None    Rx / DC Orders ED Discharge Orders     None         Veryl Speak, MD 09/08/21 [phone]

## 2021-09-08 LAB — URINALYSIS, ROUTINE W REFLEX MICROSCOPIC
Bilirubin Urine: NEGATIVE
Glucose, UA: NEGATIVE mg/dL
Hgb urine dipstick: NEGATIVE
Ketones, ur: NEGATIVE mg/dL
Leukocytes,Ua: NEGATIVE
Nitrite: NEGATIVE
Protein, ur: NEGATIVE mg/dL
Specific Gravity, Urine: 1.005 (ref 1.005–1.030)
pH: 6 (ref 5.0–8.0)

## 2021-09-08 LAB — ETHANOL: Alcohol, Ethyl (B): 165 mg/dL — ABNORMAL HIGH (ref <10)

## 2021-09-08 NOTE — Discharge Instructions (Addendum)
Avoid using THC vaping in the future.  Follow-up with primary doctor and return to the ER if symptoms worsen or change.

## 2021-09-30 ENCOUNTER — Ambulatory Visit (INDEPENDENT_AMBULATORY_CARE_PROVIDER_SITE_OTHER): Payer: Self-pay

## 2021-09-30 NOTE — Telephone Encounter (Signed)
  Chief Complaint: medication review Symptoms: HTN BP 158/100 Frequency: been without medication x 9 days d/t being in [facility] Pertinent Negatives: NA Disposition: '[]'$ ED /'[]'$ Urgent Care (no appt availability in office) / '[]'$ Appointment(In office/virtual)/ '[]'$  [facility]/ '[x]'$ Home Care/ '[]'$ Refused Recommended Disposition /'[]'$ [facility]/ '[]'$  Follow-up with PCP Additional Notes: Helene Kelp, clinic nurse at [facility] calling to review pt's medications so she can start taking them since her BP is elevated and been without them for 9 days now. Medications were reviewed and answered questions for her. No further assistance was needed.    Reason for Disposition  Caller has medicine question only, adult not sick, AND triager answers question  Protocols used: Medication Question Call-A-AH

## 2021-10-01 ENCOUNTER — Encounter: Payer: Self-pay | Admitting: Emergency Medicine

## 2021-10-20 ENCOUNTER — Other Ambulatory Visit: Payer: Self-pay

## 2022-09-22 ENCOUNTER — Other Ambulatory Visit: Payer: Self-pay

## 2022-09-22 ENCOUNTER — Encounter (HOSPITAL_COMMUNITY): Payer: Self-pay | Admitting: *Deleted

## 2022-09-22 ENCOUNTER — Inpatient Hospital Stay (HOSPITAL_COMMUNITY)
Admission: RE | Admit: 2022-09-22 | Discharge: 2022-09-22 | Disposition: A | Discharge status: Home / Self Care | Payer: Self-pay | Source: Ambulatory Visit

## 2022-09-22 ENCOUNTER — Ambulatory Visit (HOSPITAL_COMMUNITY)
Admission: EM | Admit: 2022-09-22 | Discharge: 2022-09-22 | Disposition: A | Discharge status: Home / Self Care | Payer: No Typology Code available for payment source

## 2022-09-22 DIAGNOSIS — H01005 Unspecified blepharitis left lower eyelid: Secondary | ICD-10-CM

## 2022-09-22 DIAGNOSIS — K029 Dental caries, unspecified: Secondary | ICD-10-CM | POA: Diagnosis not present

## 2022-09-22 DIAGNOSIS — E119 Type 2 diabetes mellitus without complications: Secondary | ICD-10-CM | POA: Diagnosis not present

## 2022-09-22 DIAGNOSIS — I10 Essential (primary) hypertension: Secondary | ICD-10-CM | POA: Diagnosis not present

## 2022-09-22 HISTORY — DX: Type 2 diabetes mellitus without complications: E11.9

## 2022-09-22 LAB — POCT FASTING CBG KUC MANUAL ENTRY: POCT Glucose (KUC): 115 mg/dL — AB (ref 70–99)

## 2022-09-22 MED ORDER — METFORMIN HCL 500 MG PO TABS
250.0000 mg | ORAL_TABLET | Freq: Two times a day (BID) | ORAL | 0 refills | Status: DC
  Filled 2022-09-22: qty 30, 30d supply, fill #0

## 2022-09-22 MED ORDER — CEPHALEXIN 500 MG PO CAPS
1000.0000 mg | ORAL_CAPSULE | Freq: Two times a day (BID) | ORAL | 0 refills | Status: DC
  Filled 2022-09-22: qty 40, 10d supply, fill #0

## 2022-09-22 MED ORDER — MELOXICAM 7.5 MG PO TABS
7.5000 mg | ORAL_TABLET | Freq: Every day | ORAL | 0 refills | Status: DC
  Filled 2022-09-22: qty 30, 30d supply, fill #0

## 2022-09-22 MED ORDER — PRAZOSIN HCL 1 MG PO CAPS
3.0000 mg | ORAL_CAPSULE | Freq: Every day | ORAL | 0 refills | Status: DC
  Filled 2022-09-22: qty 30, 10d supply, fill #0

## 2022-09-22 NOTE — ED Provider Notes (Signed)
[facility]    CSN: [id number] Arrival date & time: 09/22/22  1150      History   Chief Complaint Chief Complaint  Patient presents with   Eye Problem   Medication Refill    HPI Gina Shelton is a 47 y.o. female presents with a couple of concerns  1- Has had a swollen area on her L lower inner  lid that keeps coming back after it drains x 1 week. Area is painful and red. This am woke up with her lashes stuck together.   2- She recently got out of jail and was diagnosed with DM and HTN and is out of her medications. Has apt with new PCP 07/15    Past Medical History:  Diagnosis Date   Anxiety    COPD (chronic obstructive pulmonary disease) (HCC)    Depression    Depression    Diabetes mellitus without complication (HCC)    GERD (gastroesophageal reflux disease)    Gout    ble   Hypertension    Migraine    Migraine     Patient Active Problem List   Diagnosis Date Noted   Low back pain 04/22/2021   Chronic pain of left ankle 10/21/2020   Left foot pain 10/21/2020   COPD exacerbation (HCC) 09/10/2019   Chest pain 09/27/2017   Polysubstance abuse (HCC) 09/27/2017   Hypertensive urgency 09/26/2017   Obesity (BMI 30.0-34.9) 05/22/2017   Ovarian cyst 05/22/2017   Cavernous hemangioma of liver 05/22/2017   Major depressive disorder, single episode, severe without psychosis (HCC) 12/31/2015   Major depressive disorder, 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 Authorizing Provider  cephALEXin (KEFLEX) 500 MG capsule Take 2 capsules (1,000 mg total) by mouth 2 (two) times daily. 09/22/22  Yes Rodriguez-Southworth, Nettie Elm, PA-C  esomeprazole (NEXIUM) 40 MG capsule Take 1 capsule (40 mg total) by mouth daily. 07/14/21  Yes Charlynne Pander, MD  meloxicam (MOBIC) 7.5 MG tablet Take 1 tablet (7.5 mg total) by mouth daily. 09/22/22  Yes Rodriguez-Southworth, Nettie Elm, PA-C  albuterol (VENTOLIN HFA) 108 (90 Base) MCG/ACT inhaler INHALE 2 PUFFS INTO THE LUNGS EVERY 6 (SIX) HOURS AS NEEDED FOR WHEEZING OR SHORTNESS OF BREATH. 02/28/20 02/27/21  Grayce Sessions, NP  albuterol (VENTOLIN HFA) 108 (90 Base) MCG/ACT inhaler Inhale 2 puffs into the lungs every 4 (four) hours as needed for wheezing or shortness of breath. 01/25/21   Valinda Hoar, NP  Blood Pressure Monitor KIT 1 kit by Does not apply route 3 (three) times daily as needed. 04/04/19   Grayce Sessions, NP  buPROPion (  WELLBUTRIN XL) 150 MG 24 hr tablet Take 1 tablet (150 mg total) by mouth daily. 03/31/21   Grayce Sessions, NP  cholecalciferol (VITAMIN D3) 25 MCG (1000 UNIT) tablet Take 1,000 Units by mouth every morning. 09/09/22   [provider]  colchicine 0.6 MG tablet Take 1 tablet (0.6 mg total) by mouth daily as needed (gout pain). 05/06/21   Zenia Resides, MD  fluticasone (FLONASE) 50 MCG/ACT nasal spray PLACE 2 SPRAYS INTO BOTH NOSTRILS DAILY. 03/31/21 03/31/22  Grayce Sessions, NP  hydrochlorothiazide (HYDRODIURIL) 25 MG tablet TAKE 1 TABLET (25 MG TOTAL) BY MOUTH DAILY. 03/31/21 03/31/22  Grayce Sessions, NP  levocetirizine (XYZAL ALLERGY 24HR) 5 MG tablet Take 1 tablet (5 mg total) by mouth every evening. 03/31/21   Grayce Sessions, NP  metFORMIN (GLUCOPHAGE) 500 MG tablet Take 0.5 tablets (250 mg total) by mouth 2 (two) times daily. 09/22/22   Rodriguez-Southworth,  Nettie Elm, PA-C  pantoprazole (PROTONIX) 20 MG tablet Take 40 mg by mouth every morning. 09/09/22   [provider]  prazosin (MINIPRESS) 1 MG capsule Take 3 capsules (3 mg total) by mouth at bedtime. 09/22/22   Rodriguez-Southworth, Nettie Elm, PA-C  sertraline (ZOLOFT) 50 MG tablet Take 150 mg by mouth every morning. 09/09/22   [provider]    Family History Family History  Problem Relation Age of Onset   Hypertension Mother    Arthritis Sister    Anesthesia problems Neg Hx    Hypotension Neg Hx    Malignant hyperthermia Neg Hx    Pseudochol deficiency Neg Hx     Social History Social History   Tobacco Use   Smoking status: Every Day    Current packs/day: 1.00    Types: Cigarettes   Smokeless tobacco: Never  Vaping Use   Vaping status: Never Used  Substance Use Topics   Alcohol use: Yes    Comment: Consumes several beers every other day per patient stated   Drug use: No     Allergies   Patient has no known allergies.   Review of Systems Review of Systems  Constitutional:  Negative for diaphoresis and fever.  HENT:  Positive for dental problem.   Eyes:  Positive for discharge. Negative for pain, redness and itching.       L lower lid tenderness  Respiratory:  Negative for chest tightness and shortness of breath.   Cardiovascular:  Negative for chest pain, palpitations and leg swelling.  Endocrine: Negative for polydipsia, polyphagia and polyuria.  Skin:  Negative for rash.  Neurological:  Negative for headaches.     Physical Exam Triage Vital Signs ED Triage Vitals  Encounter Vitals Group     BP 09/22/22 1227 117/77     Systolic BP Percentile --      Diastolic BP Percentile --      Pulse Rate 09/22/22 1227 88     Resp 09/22/22 1227 18     Temp 09/22/22 1227 98.6 F (37 C)     Temp Source 09/22/22 1227 Oral     SpO2 09/22/22 1227 96 %     Weight --      Height --      Head Circumference --      Peak Flow --      Pain Score 09/22/22 1222  9     Pain Loc --      Pain Education --      Exclude from Growth Chart --    No  data found.  Updated Vital Signs BP 117/77 (BP Location: Left Arm)   Pulse 88   Temp 98.6 F (37 C) (Oral)   Resp 18   LMP 08/21/2022 (Approximate)   SpO2 96%   Visual Acuity Right Eye Distance: 20/25 Left Eye Distance: 20/70 Bilateral Distance: 20/25  Right Eye Near:   Left Eye Near:    Bilateral Near:     Physical Exam Vitals and nursing note reviewed.  Constitutional:      General: She is not in acute distress.    Appearance: She is obese. She is not toxic-appearing.  HENT:     Head: Normocephalic.     Right Ear: External ear normal.     Left Ear: External ear normal.     Mouth/Throat:     Mouth: Mucous membranes are moist.      Comments: Tender L lower molars when tapping on them. The gum does not look like has abscess. Has some cavities on those molars Eyes:     General: No scleral icterus.       Left eye: Hordeolum present.    Extraocular Movements:     Right eye: Normal extraocular motion.     Left eye: Normal extraocular motion.     Conjunctiva/sclera: Conjunctivae normal.     Comments: Has erythema on medial lower lid and has lost her eye lashes on mid lower lid. There is yellow matter from the mid lower lid area opening. The is mild induration.   Cardiovascular:     Rate and Rhythm: Normal rate and regular rhythm.  Pulmonary:     Effort: Pulmonary effort is normal.     Breath sounds: Normal breath sounds.  Musculoskeletal:     Cervical back: Neck supple.     Right lower leg: No edema.     Left lower leg: No edema.  Lymphadenopathy:     Cervical: No cervical adenopathy.  Skin:    General: Skin is warm and dry.  Neurological:     Mental Status: She is alert and oriented to person, place, and time.     Gait: Gait normal.  Psychiatric:        Mood and Affect: Mood normal.        Thought Content: Thought content normal.        Judgment: Judgment normal.      UC  Treatments / Results  Labs (all labs ordered are listed, but only abnormal results are displayed) Labs Reviewed  POCT FASTING CBG [facility] MANUAL ENTRY - Abnormal; Notable for the following components:      Result Value   POCT Glucose ([facility]) 115 (*)    All other components within normal limits    EKG   Radiology No results found.  Procedures Procedures (including critical care time)  Medications Ordered in UC Medications - No data to display  Initial Impression / Assessment and Plan / UC Course  I have reviewed the triage vital signs and the nursing notes.  L lower lif sty and blepharitis DM type 2 HTN Dental pain, possible infection  I placed her on Keflex as noted to cover her eye lid and dental pain I refilled her Minipress and Metformin as noted I also placed her on Mobic for pain. Needs to D/C Ibuprofen.    Final Clinical Impressions(s) / UC Diagnoses   Final diagnoses:  Non-insulin dependent type 2 diabetes mellitus (HCC)  Essential hypertension  Blepharitis of left lower eyelid, unspecified type  Pain due to dental  caries     Discharge Instructions      Continue the warm compresses 2-3 times a day for a few more days     ED Prescriptions     Medication Sig Dispense Auth. Provider   metFORMIN (GLUCOPHAGE) 500 MG tablet Take 0.5 tablets (250 mg total) by mouth 2 (two) times daily. 30 tablet Rodriguez-Southworth, Adolphe Fortunato, PA-C   prazosin (MINIPRESS) 1 MG capsule Take 3 capsules (3 mg total) by mouth at bedtime. 30 capsule Rodriguez-Southworth, Shevette Bess, PA-C   cephALEXin (KEFLEX) 500 MG capsule Take 2 capsules (1,000 mg total) by mouth 2 (two) times daily. 40 capsule Rodriguez-Southworth, Nettie Elm, PA-C   meloxicam (MOBIC) 7.5 MG tablet Take 1 tablet (7.5 mg total) by mouth daily. 30 tablet Rodriguez-Southworth, Nettie Elm, PA-C      PDMP not reviewed this encounter.   Garey Ham, PA-C 09/22/22 1423

## 2022-09-22 NOTE — Discharge Instructions (Addendum)
Continue the warm compresses 2-3 times a day for a few more days

## 2022-09-22 NOTE — ED Triage Notes (Signed)
Pt states that she has had a spot on her left lower eye lid that keeps filling and busting then repeats. She states its painful. She states her vision is blurry and red now.    She just got out of jail and was dx with DM she also has hypertension and is out of all meds. She has a PCP appt on 7/15 but needs refills until then.

## 2022-09-26 ENCOUNTER — Ambulatory Visit (INDEPENDENT_AMBULATORY_CARE_PROVIDER_SITE_OTHER): Payer: No Typology Code available for payment source | Admitting: Primary Care

## 2022-09-26 ENCOUNTER — Encounter (INDEPENDENT_AMBULATORY_CARE_PROVIDER_SITE_OTHER): Payer: Self-pay | Admitting: Primary Care

## 2022-09-26 VITALS — BP 122/83 | HR 106 | Resp 16 | Wt 257.6 lb

## 2022-09-26 DIAGNOSIS — E559 Vitamin D deficiency, unspecified: Secondary | ICD-10-CM

## 2022-09-26 DIAGNOSIS — F330 Major depressive disorder, recurrent, mild: Secondary | ICD-10-CM

## 2022-09-26 DIAGNOSIS — R739 Hyperglycemia, unspecified: Secondary | ICD-10-CM

## 2022-09-26 DIAGNOSIS — I10 Essential (primary) hypertension: Secondary | ICD-10-CM | POA: Diagnosis not present

## 2022-09-26 LAB — POCT GLYCOSYLATED HEMOGLOBIN (HGB A1C): HbA1c, POC (prediabetic range): 6.1 % (ref 5.7–6.4)

## 2022-09-26 MED ORDER — VITAMIN D3 25 MCG (1000 UNIT) PO TABS: 1000.0000 [IU] | ORAL_TABLET | Freq: Every morning | ORAL | 1 refills | Status: DC

## 2022-09-26 MED ORDER — SERTRALINE HCL 50 MG PO TABS: 150.0000 mg | ORAL_TABLET | Freq: Every morning | ORAL | 1 refills | Status: DC

## 2022-09-26 MED ORDER — HYDROCHLOROTHIAZIDE 25 MG PO TABS: ORAL_TABLET | Freq: Every day | ORAL | 3 refills | Status: DC

## 2022-09-28 NOTE — Progress Notes (Signed)
[facility]   Subjective:   Ms. Gina Shelton is a 47 y.o. female presents for hospital follow up and establish care. Admit date to the hospital was 09/22/22, patient was discharged from the hospital on 09/22/22, patient was admitted for:  female presents with a couple of concerns   Has had a swollen area on her L lower inner  lid that keeps coming back after it drains x 1 week. Area is painful and red. This am woke up with her lashes stuck together. She recently got out of jail and was diagnosed with DM and HTN and is out of her medications. Has apt with new PCP 07/15  Past Medical History:  Diagnosis Date   Anxiety    COPD (chronic obstructive pulmonary disease) (HCC)    Depression    Depression    Diabetes mellitus without complication (HCC)    GERD (gastroesophageal reflux disease)    Gout    ble   Hypertension    Migraine    Migraine      Not on File    Current Outpatient Medications on File Prior to Visit  Medication Sig Dispense Refill   albuterol (VENTOLIN HFA) 108 (90 Base) MCG/ACT inhaler INHALE 2 PUFFS INTO THE LUNGS EVERY 6 (SIX) HOURS AS NEEDED FOR WHEEZING OR SHORTNESS OF BREATH. 8.5 g 1   albuterol (VENTOLIN HFA) 108 (90 Base) MCG/ACT inhaler Inhale 2 puffs into the lungs every 4 (four) hours as needed for wheezing or shortness of breath. 18 g 1   Blood Pressure Monitor KIT 1 kit by Does not apply route 3 (three) times daily as needed. 1 kit 0   cephALEXin (KEFLEX) 500 MG capsule Take 2 capsules (1,000 mg total) by mouth 2 (two) times daily. 40 capsule 0   colchicine 0.6 MG tablet Take 1 tablet (0.6 mg total) by mouth daily as needed (gout pain). 30 tablet 0   esomeprazole (NEXIUM) 40 MG capsule Take 1 capsule (40 mg total) by mouth daily. 30 capsule 0   fluticasone (FLONASE) 50 MCG/ACT nasal spray PLACE 2 SPRAYS INTO BOTH NOSTRILS DAILY. 16 g 3   levocetirizine (XYZAL ALLERGY 24HR) 5 MG tablet Take 1 tablet (5 mg total) by mouth every evening. 90  tablet 1   metFORMIN (GLUCOPHAGE) 500 MG tablet Take 0.5 tablets (250 mg total) by mouth 2 (two) times daily. 30 tablet 0   pantoprazole (PROTONIX) 20 MG tablet Take 40 mg by mouth every morning.     prazosin (MINIPRESS) 1 MG capsule Take 3 capsules (3 mg total) by mouth at bedtime. 30 capsule 0   No current facility-administered medications on file prior to visit.     Review of System: ROS  Objective:  Blood Pressure 122/83   Pulse (Abnormal) 106   Respiration 16   Weight 257 lb 9.6 oz (116.8 kg)   Last Menstrual Period 08/21/2022 (Approximate)   Oxygen Saturation 100%   Body Mass Index 47.12 kg/m   Filed Weights   09/26/22 1559  Weight: 257 lb 9.6 oz (116.8 kg)    Physical Exam:   General Appearance: Well nourished, in no apparent distress. Eyes: PERRLA, EOMs, conjunctiva no swelling or erythema Sinuses: No Frontal/maxillary tenderness ENT/Mouth: Ext aud canals clear, TMs without erythema, bulging. No erythema, swelling, or exudate on post pharynx.  Tonsils not swollen or erythematous. Hearing normal.  Neck: Supple, thyroid normal.  Respiratory: Respiratory effort normal, BS equal bilaterally without rales, rhonchi, wheezing or stridor.  Cardio: RRR with no  MRGs. Brisk peripheral pulses without edema.  Abdomen: Soft, + BS.  Non tender, no guarding, rebound, hernias, masses. Lymphatics: Non tender without lymphadenopathy.  Musculoskeletal: Full ROM, 5/5 strength, normal gait.  Skin: Warm, dry without rashes, lesions, ecchymosis.  Neuro: Cranial nerves intact. Normal muscle tone, no cerebellar symptoms. Sensation intact.  Psych: Awake and oriented X 3, normal affect, Insight and Judgment appropriate.    Assessment:  Gina Shelton was seen today for hospitalization follow-up.  Diagnoses and all orders for this visit:  Hyperglycemia -     POCT glycosylated hemoglobin (Hb A1C)  Essential hypertension Well control  DIET: Limit salt intake, read nutrition labels to check  salt content, limit fried and high fatty foods  Avoid using multisymptom OTC cold preparations that generally contain sudafed which can rise BP. Consult with pharmacist on best cold relief products to use for persons with HTN EXERCISE Discussed incorporating exercise such as walking - 30 minutes most days of the week and can do in 10 minute intervals    -     hydrochlorothiazide (HYDRODIURIL) 25 MG tablet; TAKE 1 TABLET (25 MG TOTAL) BY MOUTH DAILY.  Vitamin D deficiency Your Vitamin D is low. Vitamin D is needed to make and keep bones strong.  You can purchase  vitamin D 2000 iu over the counter  and take 1 tablet daily.   -     cholecalciferol (VITAMIN D3) 25 MCG (1000 UNIT) tablet; Take 1 tablet (1,000 Units total) by mouth every morning.  Other orders/ Mild episode of recurrent major depressive disorder (HCC)  -     sertraline (ZOLOFT) 50 MG tablet; Take 3 tablets (150 mg total) by mouth every morning.    Meds ordered this encounter  Medications   sertraline (ZOLOFT) 50 MG tablet    Sig: Take 3 tablets (150 mg total) by mouth every morning.    Dispense:  180 tablet    Refill:  1    Order Specific Question:   Supervising Provider    Answer:   Quentin Angst [[id number]]   cholecalciferol (VITAMIN D3) 25 MCG (1000 UNIT) tablet    Sig: Take 1 tablet (1,000 Units total) by mouth every morning.    Dispense:  90 tablet    Refill:  1    Order Specific Question:   Supervising Provider    Answer:   Quentin Angst [[id number]]   hydrochlorothiazide (HYDRODIURIL) 25 MG tablet    Sig: TAKE 1 TABLET (25 MG TOTAL) BY MOUTH DAILY.    Dispense:  180 tablet    Refill:  3    Order Specific Question:   Supervising Provider    Answer:   Quentin Angst [[id number]]    This note has been created with Dragon speech recognition software and smart phrase technology. Any transcriptional errors are unintentional.   Grayce Sessions, NP 09/28/2022, 3:01 PM

## 2022-09-30 ENCOUNTER — Other Ambulatory Visit (HOSPITAL_COMMUNITY): Payer: Self-pay

## 2022-09-30 ENCOUNTER — Other Ambulatory Visit (INDEPENDENT_AMBULATORY_CARE_PROVIDER_SITE_OTHER): Payer: Self-pay | Admitting: Primary Care

## 2022-09-30 DIAGNOSIS — I10 Essential (primary) hypertension: Secondary | ICD-10-CM

## 2022-09-30 DIAGNOSIS — E559 Vitamin D deficiency, unspecified: Secondary | ICD-10-CM

## 2022-09-30 NOTE — Telephone Encounter (Signed)
Requested medication (s) are due for refill today:Yes  Requested medication (s) are on the active medication list: Yes  Last refill:  09/26/22 to the wrong pharmacy  Future visit scheduled: Yes  Notes to clinic:  Unable to refill per protocol due to failed labs, no updated results. Resend to a different pharmacy     Requested Prescriptions  Pending Prescriptions Disp Refills   cholecalciferol (VITAMIN D3) 25 MCG (1000 UNIT) tablet 90 tablet 1    Sig: Take 1 tablet (1,000 Units total) by mouth every morning.     Endocrinology:  Vitamins - Vitamin D Supplementation 2 Failed - 09/30/2022  3:33 PM      Failed - Manual Review: Route requests for 50,000 IU strength to the provider      Failed - Ca in normal range and within 360 days    Calcium  Date Value Ref Range Status  09/07/2021 8.6 (L) 8.9 - 10.3 mg/dL Final   Calcium, Ion  Date Value Ref Range Status  12/31/2019 1.19 1.15 - 1.40 mmol/L Final         Failed - Vitamin D in normal range and within 360 days    Vit D, 25-Hydroxy  Date Value Ref Range Status  09/17/2019 9 (L) 30 - 100 ng/mL Final    Comment:    Vitamin D Status         25-OH Vitamin D: . Deficiency:                    <20 ng/mL Insufficiency:             20 - 29 ng/mL Optimal:                 > or = 30 ng/mL . For 25-OH Vitamin D testing on patients on  D2-supplementation and patients for whom quantitation  of D2 and D3 fractions is required, the QuestAssureD(TM) 25-OH VIT D, (D2,D3), LC/MS/MS is recommended: order  code 81191 (patients >24yrs). See Note 1 . Note 1 . For additional information, please refer to  http://education.[facility].com/faq/FAQ199  (This link is being provided for informational/ educational purposes only.)          Passed - Valid encounter within last 12 months    Recent Outpatient Visits           4 days ago Hyperglycemia   [place] [facility] Grayce Sessions, NP   1 year ago Current  moderate episode of major depressive disorder, unspecified whether recurrent (HCC)   [place] [facility] Grayce Sessions, NP   2 years ago Other acute sinusitis, recurrence not specified   [place] [facility] Grayce Sessions, NP   3 years ago Low back pain, unspecified back pain laterality, unspecified chronicity, unspecified whether sciatica present   [place] [facility] Grayce Sessions, NP   3 years ago Tobacco abuse   [place] [facility] Grayce Sessions, NP       Future Appointments             In 1 week Randa Evens, Kinnie Scales, NP [place] [facility]             hydrochlorothiazide (HYDRODIURIL) 25 MG tablet 180 tablet 3    Sig: TAKE 1 TABLET (25 MG TOTAL) BY MOUTH DAILY.     Cardiovascular: Diuretics - Thiazide Failed - 09/30/2022  3:33 PM      Failed -  Cr in normal range and within 180 days    Creatinine, Ser  Date Value Ref Range Status  09/07/2021 0.57 0.44 - 1.00 mg/dL Final         Failed - K in normal range and within 180 days    Potassium  Date Value Ref Range Status  09/07/2021 3.1 (L) 3.5 - 5.1 mmol/L Final         Failed - Na in normal range and within 180 days    Sodium  Date Value Ref Range Status  09/07/2021 144 135 - 145 mmol/L Final  04/04/2019 143 134 - 144 mmol/L Final         Passed - Last BP in normal range    BP Readings from Last 1 Encounters:  09/26/22 122/83         Passed - Valid encounter within last 6 months    Recent Outpatient Visits           4 days ago Hyperglycemia   [place] [facility] Grayce Sessions, NP   1 year ago Current moderate episode of major depressive disorder, unspecified whether recurrent (HCC)   [place] [facility] Grayce Sessions, NP   2 years ago Other acute sinusitis, recurrence not specified   [place] [facility] Grayce Sessions, NP   3 years ago Low back pain, unspecified back pain laterality, unspecified chronicity, unspecified whether sciatica present   [place] [facility] Grayce Sessions, NP   3 years ago Tobacco abuse   [place] [facility] Grayce Sessions, NP       Future Appointments             In 1 week Randa Evens, Kinnie Scales, NP [place] [facility]

## 2022-09-30 NOTE — Telephone Encounter (Signed)
Patient would like her cholecalciferol (VITAMIN D3) 25 MCG (1000 UNIT) tablet and hydrochlorothiazide (HYDRODIURIL) 25 MG tablet  to be sent to the community pharmacy. Patient said they were not supposed to be sent to [facility] - [facility] Phone: [phone]  Fax: [phone]

## 2022-10-11 ENCOUNTER — Ambulatory Visit (INDEPENDENT_AMBULATORY_CARE_PROVIDER_SITE_OTHER): Payer: No Typology Code available for payment source | Admitting: Primary Care

## 2022-10-19 ENCOUNTER — Other Ambulatory Visit: Payer: Self-pay

## 2022-10-19 ENCOUNTER — Other Ambulatory Visit (INDEPENDENT_AMBULATORY_CARE_PROVIDER_SITE_OTHER): Payer: Self-pay | Admitting: Primary Care

## 2022-10-20 NOTE — Telephone Encounter (Signed)
Will forward to provider  

## 2022-10-21 ENCOUNTER — Other Ambulatory Visit: Payer: Self-pay

## 2022-10-21 ENCOUNTER — Other Ambulatory Visit (INDEPENDENT_AMBULATORY_CARE_PROVIDER_SITE_OTHER): Payer: Self-pay | Admitting: Primary Care

## 2022-10-21 ENCOUNTER — Other Ambulatory Visit (HOSPITAL_COMMUNITY): Payer: Self-pay

## 2022-10-21 NOTE — Telephone Encounter (Signed)
Will forward to provider  

## 2022-10-21 NOTE — Telephone Encounter (Signed)
....  Medication Refill - Medication: metFORMIN (GLUCOPHAGE) 500 MG tablet  ( PATIENT STATE SHE IS OUT OF MEDS)     Has the patient contacted their pharmacy?  [facility] [address], [place] [address] Phone: [phone]  Fax: [phone]   ALTERNATE IF YOU CAN NOT GET RX IN TODAY AT [facility]   [id number] [place], [place] - [address] AT [facility] OF [address]  [address] [place] [address]  Phone: [phone] Fax: [phone]  Hours: Open 24 hours        Has the patient been seen for an appointment in the last year OR does the patient have an upcoming appointment?  YES

## 2022-10-21 NOTE — Telephone Encounter (Signed)
Requested medication (s) are due for refill today: yes  Requested medication (s) are on the active medication list: yes    Last refill: 09/22/22  #30  0 efills  Future visit scheduled    yes  10/27/22  Notes to clinic:Historical Provider, please review. Thank you.  Requested Prescriptions  Pending Prescriptions Disp Refills   prazosin (MINIPRESS) 1 MG capsule 30 capsule 0    Sig: Take 3 capsules (3 mg total) by mouth at bedtime.     Cardiovascular:  Alpha Blockers Passed - 10/21/2022  1:24 PM      Passed - Last BP in normal range    BP Readings from Last 1 Encounters:  09/26/22 122/83         Passed - Valid encounter within last 6 months    Recent Outpatient Visits           3 weeks ago Hyperglycemia   [place] [facility] Grayce Sessions, NP   1 year ago Current moderate episode of major depressive disorder, unspecified whether recurrent (HCC)   [place] [facility] Grayce Sessions, NP   2 years ago Other acute sinusitis, recurrence not specified   [place] [facility] Grayce Sessions, NP   3 years ago Low back pain, unspecified back pain laterality, unspecified chronicity, unspecified whether sciatica present   [place] [facility] Grayce Sessions, NP   3 years ago Tobacco abuse   [place] [facility] Grayce Sessions, NP       Future Appointments             In 6 days Randa Evens, Kinnie Scales, NP [place] [facility]

## 2022-10-24 ENCOUNTER — Encounter (HOSPITAL_COMMUNITY): Payer: Self-pay

## 2022-10-24 ENCOUNTER — Other Ambulatory Visit: Payer: Self-pay

## 2022-10-24 ENCOUNTER — Ambulatory Visit (HOSPITAL_COMMUNITY)
Admission: RE | Admit: 2022-10-24 | Discharge: 2022-10-24 | Disposition: A | Discharge status: Home / Self Care | Payer: 59 | Source: Ambulatory Visit | Attending: Internal Medicine | Admitting: Internal Medicine

## 2022-10-24 VITALS — BP 115/78 | HR 109 | Temp 98.2°F | Resp 18 | Ht 62.5 in | Wt 257.0 lb

## 2022-10-24 DIAGNOSIS — M778 Other enthesopathies, not elsewhere classified: Secondary | ICD-10-CM

## 2022-10-24 MED ORDER — MELOXICAM 7.5 MG PO TABS
7.5000 mg | ORAL_TABLET | Freq: Every day | ORAL | 0 refills | Status: DC
  Filled 2022-10-24: qty 30, 30d supply, fill #0

## 2022-10-24 MED ORDER — PREDNISONE 20 MG PO TABS: 40.0000 mg | ORAL_TABLET | Freq: Every day | ORAL | 0 refills | Status: AC

## 2022-10-24 MED ORDER — COLCHICINE 0.6 MG PO TABS
0.6000 mg | ORAL_TABLET | Freq: Every day | ORAL | 0 refills | Status: DC | PRN
Start: 2022-10-24 — End: 2022-10-27
  Filled 2022-10-24: qty 30, 30d supply, fill #0

## 2022-10-24 NOTE — ED Provider Notes (Signed)
[facility]    CSN: [id number] Arrival date & time: 10/24/22  1717      History   Chief Complaint Chief Complaint  Patient presents with   Foot Injury   Appointment    HPI Gina Shelton is a 47 y.o. female.   Patient presents to urgent care for evaluation of pain to the bilateral feet that started approximately 2 weeks ago.  She states she recently started working again after she was incarcerated for 1 year this past year and has been having foot pain ever since.  She has also gained approximately 90 pounds since the last time she worked consistently on her feet 1 year ago and believes this is contributing to foot pain as well.  Bilateral foot pain is described as throbbing and burning pain.  History of type 2 diabetes (most recent A1c 6.1), states this feels different than her neuropathy pain related to diabetes.  No recent trauma or injuries to the feet, decreased sensation to the feet, color changes to the feet, or redness/swelling.  Pain is triggered by weightbearing activity and improves when she sits down but does not fully go away.  No recent steroid use.  Taking Tylenol and ibuprofen over-the-counter without relief.  She works at [employer] on her feet for long shifts.   Foot Injury   Past Medical History:  Diagnosis Date   Anxiety    COPD (chronic obstructive pulmonary disease) (HCC)    Depression    Depression    Diabetes mellitus without complication (HCC)    GERD (gastroesophageal reflux disease)    Gout    ble   Hypertension    Migraine    Migraine     Patient Active Problem List   Diagnosis Date Noted   Low back pain 04/22/2021   Chronic pain of left ankle 10/21/2020   Left foot pain 10/21/2020   COPD exacerbation (HCC) 09/10/2019   Chest pain 09/27/2017   Polysubstance abuse (HCC) 09/27/2017   Hypertensive urgency 09/26/2017   Obesity (BMI 30.0-34.9) 05/22/2017   Ovarian cyst 05/22/2017   Cavernous hemangioma of liver  05/22/2017   Major depressive disorder, single episode, severe without psychosis (HCC) 12/31/2015   Major depressive disorder, 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 Authorizing Provider  albuterol (VENTOLIN HFA) 108 (90 Base) MCG/ACT inhaler Inhale 2 puffs into the lungs every 4 (four) hours as needed for wheezing or shortness of breath. 01/25/21  Yes White, Elita Boone, NP  cholecalciferol (VITAMIN D3) 25 MCG (1000 UNIT) tablet Take 1 tablet (1,000 Units total) by mouth every morning. 09/26/22  Yes Grayce Sessions, NP  esomeprazole (NEXIUM) 40 MG capsule Take 1 capsule (40 mg total) by mouth daily. 07/14/21  Yes Charlynne Pander, MD  fluticasone (FLONASE) 50 MCG/ACT nasal spray PLACE 2 SPRAYS INTO BOTH NOSTRILS DAILY. 03/31/21 10/24/22 Yes Grayce Sessions, NP  hydrochlorothiazide (HYDRODIURIL) 25 MG tablet TAKE 1 TABLET (25 MG TOTAL) BY MOUTH DAILY. 09/26/22 09/26/23 Yes Grayce Sessions, NP  levocetirizine (XYZAL ALLERGY 24HR) 5 MG tablet Take 1 tablet (5 mg total) by mouth every evening. 03/31/21  Yes Grayce Sessions, NP  meloxicam (MOBIC) 7.5 MG tablet Take 1 tablet (7.5 mg total) by mouth daily. 10/24/22  Yes Grayce Sessions, NP  metFORMIN (GLUCOPHAGE) 500 MG tablet Take 0.5 tablets (250 mg total) by mouth 2 (two) times daily. 09/22/22  Yes Rodriguez-Southworth, Nettie Elm, PA-C  pantoprazole  (PROTONIX) 20 MG tablet Take 40 mg by mouth every morning. 09/09/22  Yes [provider]  prazosin (MINIPRESS) 1 MG capsule Take 3 capsules (3 mg total) by mouth at bedtime. 09/22/22  Yes Rodriguez-Southworth, Nettie Elm, PA-C  sertraline (ZOLOFT) 50 MG tablet Take 3 tablets (150 mg total) by mouth every morning. 09/26/22  Yes Grayce Sessions, NP  albuterol (VENTOLIN HFA) 108 (90 Base) MCG/ACT inhaler INHALE 2 PUFFS INTO THE LUNGS EVERY 6 (SIX) HOURS AS NEEDED FOR WHEEZING OR SHORTNESS OF BREATH. 02/28/20 02/27/21  Grayce Sessions, NP  Blood Pressure Monitor KIT 1 kit by Does not apply route 3 (three) times daily as needed. 04/04/19   Grayce Sessions, NP  cephALEXin (KEFLEX) 500 MG capsule Take 2 capsules (1,000 mg total) by mouth 2 (two) times daily. 09/22/22   Rodriguez-Southworth, Nettie Elm, PA-C  colchicine 0.6 MG tablet Take 1 tablet (0.6 mg total) by mouth daily as needed (gout pain). 10/24/22   Grayce Sessions, NP    Family History Family History  Problem Relation Age of Onset   Hypertension Mother    Arthritis Sister    Anesthesia problems Neg Hx    Hypotension Neg Hx    Malignant hyperthermia Neg Hx    Pseudochol deficiency Neg Hx     Social History Social History   Tobacco Use   Smoking status: Every Day    Current packs/day: 1.00    Types: Cigarettes   Smokeless tobacco: Never  Vaping Use   Vaping status: Never Used  Substance Use Topics   Alcohol use: Yes    Comment: Consumes several beers every other day per patient stated   Drug use: No     Allergies   Patient has no known allergies.   Review of Systems Review of Systems Per HPI  Physical Exam Triage Vital Signs ED Triage Vitals  Encounter Vitals Group     BP 10/24/22 1752 115/78     Systolic BP Percentile --      Diastolic BP Percentile --      Pulse Rate 10/24/22 1752 (!) 109     Resp 10/24/22 1752 18     Temp 10/24/22 1752 98.2 F (36.8 C)     Temp Source 10/24/22 1752 Oral      SpO2 10/24/22 1752 98 %     Weight 10/24/22 1751 257 lb (116.6 kg)     Height 10/24/22 1751 5' 2.5" (1.588 m)     Head Circumference --      Peak Flow --      Pain Score 10/24/22 1748 10     Pain Loc --  Pain Education --      Exclude from Growth Chart --    No data found.  Updated Vital Signs BP 115/78 (BP Location: Left Arm)   Pulse (!) 109   Temp 98.2 F (36.8 C) (Oral)   Resp 18   Ht 5' 2.5" (1.588 m)   Wt 257 lb (116.6 kg)   LMP 09/20/2022 (Approximate)   SpO2 98%   BMI 46.26 kg/m   Visual Acuity Right Eye Distance:   Left Eye Distance:   Bilateral Distance:    Right Eye Near:   Left Eye Near:    Bilateral Near:     Physical Exam Vitals and nursing note reviewed.  Constitutional:      Appearance: She is obese. She is not ill-appearing or toxic-appearing.  HENT:     Head: Normocephalic and atraumatic.     Right Ear: Hearing and external ear normal.     Left Ear: Hearing and external ear normal.     Nose: Nose normal.     Mouth/Throat:     Lips: Pink.  Eyes:     General: Lids are normal. Vision grossly intact. Gaze aligned appropriately.     Extraocular Movements: Extraocular movements intact.     Conjunctiva/sclera: Conjunctivae normal.  Pulmonary:     Effort: Pulmonary effort is normal.  Musculoskeletal:     Cervical back: Neck supple.     Right foot: Normal range of motion. Tenderness (Diffuse tenderness to palpation over the dorsal aspect of the midfoot and forefoot) present.     Left foot: Normal range of motion. Tenderness present.     Comments: Diffuse tenderness to palpation over the dorsal aspect of the bilateral midfoot and forefoot.  Sensation intact distally.  5/5 strength with dorsi flexion and plantarflexion.  Feet:     Right foot:     Skin integrity: Skin integrity normal.     Left foot:     Skin integrity: Skin integrity normal.  Skin:    General: Skin is warm and dry.     Capillary Refill: Capillary refill takes less than 2  seconds.     Findings: No rash.  Neurological:     General: No focal deficit present.     Mental Status: She is alert and oriented to person, place, and time. Mental status is at baseline.     Cranial Nerves: No dysarthria or facial asymmetry.  Psychiatric:        Mood and Affect: Mood normal.        Speech: Speech normal.        Behavior: Behavior normal.        Thought Content: Thought content normal.        Judgment: Judgment normal.      UC Treatments / Results  Labs (all labs ordered are listed, but only abnormal results are displayed) Labs Reviewed - No data to display  EKG   Radiology No results found.  Procedures Procedures (including critical care time)  Medications Ordered in UC Medications - No data to display  Initial Impression / Assessment and Plan / UC Course  I have reviewed the triage vital signs and the nursing notes.  Pertinent labs & imaging results that were available during my care of the patient were reviewed by me and considered in my medical decision making (see chart for details).   1.  Tendinitis of both feet Presentation consistent with overuse tendinopathy of the bilateral feet related to recent sudden increase in physical activity.  She has failed treatment with NSAIDs and Tylenol, therefore will initiate steroid burst (prednisone 40 mg once daily for 5 days).  No NSAIDs while taking prednisone, advised to take with food.  Advised RICE and elevation.  May follow-up with PCP for ongoing evaluation and management of this.  Discussed use of Dr. Margart Sickles shoe inserts to improve pain and support of the feet while at work.   Counseled patient on potential for adverse effects with medications prescribed/recommended today, strict ER and return-to-clinic precautions discussed, patient verbalized understanding.   Final Clinical Impressions(s) / UC Diagnoses   Final diagnoses:  Tendinitis of both feet     Discharge Instructions      Take  prednisone 40mg  once daily for 5 days with food.  This may increase your sugars temporarily so keep an eye on this.  Follow-up with PCP as scheduled.  If you develop any new or worsening symptoms or if your symptoms do not start to improve, please return here or follow-up with your primary care provider. If your symptoms are severe, please go to the emergency room.     ED Prescriptions   None    PDMP not reviewed this encounter.   Carlisle Beers, [place] 10/24/22 [phone]

## 2022-10-24 NOTE — ED Triage Notes (Signed)
Foot Pain x2 weeks. Pain bilaterally but no known injuries or falls. Patient was out of work for a year and just started back working. Since then having swelling, aching, burning, constantly with occasional sharp pains.   Patient has a history of drop foot. Patient wearing foot braces with no relief.

## 2022-10-24 NOTE — Discharge Instructions (Signed)
Take prednisone 40mg  once daily for 5 days with food.  This may increase your sugars temporarily so keep an eye on this.  Follow-up with PCP as scheduled.  If you develop any new or worsening symptoms or if your symptoms do not start to improve, please return here or follow-up with your primary care provider. If your symptoms are severe, please go to the emergency room.

## 2022-10-24 NOTE — Telephone Encounter (Signed)
Requested medication (s) are due for refill today: yes  Requested medication (s) are on the active medication list: yes  Last refill:  09/22/22  Future visit scheduled: yes  Notes to clinic:  Unable to refill per protocol, last refill by another provider. Routing for approval.     Requested Prescriptions  Pending Prescriptions Disp Refills   metFORMIN (GLUCOPHAGE) 500 MG tablet 30 tablet 0    Sig: Take 0.5 tablets (250 mg total) by mouth 2 (two) times daily.     Endocrinology:  Diabetes - Biguanides Failed - 10/21/2022  4:17 PM      Failed - Cr in normal range and within 360 days    Creatinine, Ser  Date Value Ref Range Status  09/07/2021 0.57 0.44 - 1.00 mg/dL Final         Failed - eGFR in normal range and within 360 days    GFR calc Af Amer  Date Value Ref Range Status  04/04/2019 127 >59 mL/min/1.73 Final   GFR, Estimated  Date Value Ref Range Status  09/07/2021 >60 >60 mL/min Final    Comment:    (NOTE) Calculated using the CKD-EPI Creatinine Equation (2021)          Failed - B12 Level in normal range and within 720 days    Vitamin B-12  Date Value Ref Range Status  02/25/2010 359 211 - 911 pg/mL Final         Failed - CBC within normal limits and completed in the last 12 months    WBC  Date Value Ref Range Status  09/07/2021 6.5 4.0 - 10.5 K/uL Final   RBC  Date Value Ref Range Status  09/07/2021 3.64 (L) 3.87 - 5.11 MIL/uL Final   Hemoglobin  Date Value Ref Range Status  09/07/2021 10.6 (L) 12.0 - 15.0 g/dL Final  13/10/6576 46.9 11.1 - 15.9 g/dL Final   HCT  Date Value Ref Range Status  09/07/2021 33.2 (L) 36.0 - 46.0 % Final   Hematocrit  Date Value Ref Range Status  04/04/2019 37.4 34.0 - 46.6 % Final   MCHC  Date Value Ref Range Status  09/07/2021 31.9 30.0 - 36.0 g/dL Final   [facility]  Date Value Ref Range Status  09/07/2021 29.1 26.0 - 34.0 pg Final   MCV  Date Value Ref Range Status  09/07/2021 91.2 80.0 - 100.0 fL Final  04/04/2019  91 79 - 97 fL Final   No results found for: "PLTCOUNTKUC", "LABPLAT", "POCPLA" RDW  Date Value Ref Range Status  09/07/2021 16.3 (H) 11.5 - 15.5 % Final  04/04/2019 14.4 11.7 - 15.4 % Final         Passed - HBA1C is between 0 and 7.9 and within 180 days    HbA1c, POC (prediabetic range)  Date Value Ref Range Status  09/26/2022 6.1 5.7 - 6.4 % Final         Passed - Valid encounter within last 6 months    Recent Outpatient Visits           4 weeks ago Hyperglycemia   [place] [facility] Grayce Sessions, NP   1 year ago Current moderate episode of major depressive disorder, unspecified whether recurrent (HCC)   [place] [facility] Grayce Sessions, NP   2 years ago Other acute sinusitis, recurrence not specified   [place] [facility] Grayce Sessions, NP   3 years ago Low back pain, unspecified back pain  laterality, unspecified chronicity, unspecified whether sciatica present   [place] [facility] Grayce Sessions, NP   3 years ago Tobacco abuse   [place] [facility] Grayce Sessions, NP       Future Appointments             Today  [facility]   In 3 days Grayce Sessions, NP [facility]

## 2022-10-27 ENCOUNTER — Encounter (INDEPENDENT_AMBULATORY_CARE_PROVIDER_SITE_OTHER): Payer: Self-pay | Admitting: Primary Care

## 2022-10-27 ENCOUNTER — Ambulatory Visit (INDEPENDENT_AMBULATORY_CARE_PROVIDER_SITE_OTHER): Payer: 59 | Admitting: Primary Care

## 2022-10-27 ENCOUNTER — Other Ambulatory Visit: Payer: Self-pay

## 2022-10-27 VITALS — BP 108/73 | HR 95 | Resp 16 | Wt 253.0 lb

## 2022-10-27 DIAGNOSIS — J018 Other acute sinusitis: Secondary | ICD-10-CM | POA: Diagnosis not present

## 2022-10-27 DIAGNOSIS — Z760 Encounter for issue of repeat prescription: Secondary | ICD-10-CM

## 2022-10-27 MED ORDER — METFORMIN HCL 500 MG PO TABS
250.0000 mg | ORAL_TABLET | Freq: Two times a day (BID) | ORAL | 1 refills | Status: DC
  Filled 2022-10-27: qty 30, 30d supply, fill #0
  Filled 2022-12-06: qty 30, 30d supply, fill #1
  Filled 2023-01-12: qty 30, 30d supply, fill #2
  Filled 2023-02-16: qty 30, 30d supply, fill #3
  Filled 2023-03-27: qty 30, 30d supply, fill #4
  Filled 2023-05-11: qty 30, 30d supply, fill #5

## 2022-10-27 MED ORDER — LEVOCETIRIZINE DIHYDROCHLORIDE 5 MG PO TABS
5.0000 mg | ORAL_TABLET | Freq: Every evening | ORAL | 1 refills | Status: DC
  Filled 2022-10-27: qty 30, 30d supply, fill #0
  Filled 2022-12-06: qty 30, 30d supply, fill #1
  Filled 2023-01-12: qty 30, 30d supply, fill #2
  Filled 2023-02-16 – 2023-05-11 (×3): qty 30, 30d supply, fill #3
  Filled 2023-06-09: qty 30, 30d supply, fill #4
  Filled 2023-07-14: qty 30, 30d supply, fill #5

## 2022-10-27 MED ORDER — ESOMEPRAZOLE MAGNESIUM 40 MG PO CPDR
40.0000 mg | DELAYED_RELEASE_CAPSULE | Freq: Every day | ORAL | 1 refills | Status: DC
  Filled 2022-10-27 (×2): qty 30, 30d supply, fill #0
  Filled 2022-12-06: qty 30, 30d supply, fill #1
  Filled 2023-01-12: qty 30, 30d supply, fill #2
  Filled 2023-02-16 – 2023-05-11 (×3): qty 30, 30d supply, fill #3
  Filled 2023-06-09: qty 30, 30d supply, fill #4
  Filled 2023-07-14 – 2023-08-31 (×2): qty 30, 30d supply, fill #5

## 2022-10-27 MED ORDER — COLCHICINE 0.6 MG PO TABS
0.6000 mg | ORAL_TABLET | Freq: Every day | ORAL | 1 refills | Status: DC | PRN
  Filled 2023-01-26: qty 30, 30d supply, fill #0
  Filled 2023-03-27 – 2023-05-11 (×2): qty 30, 30d supply, fill #1
  Filled 2023-06-09: qty 30, 30d supply, fill #2

## 2022-10-27 MED ORDER — GABAPENTIN 100 MG PO CAPS
100.0000 mg | ORAL_CAPSULE | Freq: Three times a day (TID) | ORAL | 1 refills | Status: DC
  Filled 2022-10-27: qty 90, 30d supply, fill #0
  Filled 2022-12-06: qty 90, 30d supply, fill #1

## 2022-10-27 MED ORDER — MELOXICAM 7.5 MG PO TABS: 7.5000 mg | ORAL_TABLET | Freq: Every day | ORAL | 1 refills | Status: DC

## 2022-10-27 MED ORDER — FLUTICASONE PROPIONATE 50 MCG/ACT NA SUSP
2.0000 | Freq: Every day | NASAL | 3 refills | Status: DC
  Filled 2022-10-27: qty 16, 30d supply, fill #0
  Filled 2022-12-30: qty 16, 30d supply, fill #1

## 2022-10-27 NOTE — Progress Notes (Signed)
Subjective:  Patient ID: Gina Shelton, female    DOB: Jun 10, 1975  Age: 47 y.o. MRN: [id number]  CC: Diabetes HPI Gina Shelton presents for Follow-up of diabetes. Patient does not check blood sugar at home.  Blood pressure is unremarkable  Compliant with meds - No, Patient is out of medication  Checking CBGs? No  Fasting avg -   Postprandial average -  Exercising regularly? - No Watching carbohydrate intake? - No Neuropathy ? - Yes, Tingling in both feet  Hypoglycemic events - No  - Recovers with :   Pertinent ROS:  Polyuria - Yes Polydipsia - Yes Vision problems - Yes  Medications as noted below. Taking them regularly without complication/adverse reaction being reported today.  She does voice some concerns with sinuses runny watery eyes, rhinitis, congestion and headache Kierstyn has a past medical history of Anxiety, COPD (chronic obstructive pulmonary disease) (HCC), Depression, Depression, Diabetes mellitus without complication (HCC), GERD (gastroesophageal reflux disease), Gout, Hypertension, Migraine, and Migraine.   She has a past surgical history that includes Cesarean section (infection at incission requiring return to OR x 2) and Tubal ligation.   Her family history includes Arthritis in her sister; Hypertension in her mother.She reports that she has been smoking cigarettes. She has never used smokeless tobacco. She reports current alcohol use. She reports that she does not use drugs.  Current Outpatient Medications on File Prior to Visit  Medication Sig Dispense Refill   albuterol (VENTOLIN HFA) 108 (90 Base) MCG/ACT inhaler INHALE 2 PUFFS INTO THE LUNGS EVERY 6 (SIX) HOURS AS NEEDED FOR WHEEZING OR SHORTNESS OF BREATH. 8.5 g 1   albuterol (VENTOLIN HFA) 108 (90 Base) MCG/ACT inhaler Inhale 2 puffs into the lungs every 4 (four) hours as needed for wheezing or shortness of breath. 18 g 1   Blood Pressure Monitor KIT 1 kit by Does not apply route 3 (three) times  daily as needed. 1 kit 0   cephALEXin (KEFLEX) 500 MG capsule Take 2 capsules (1,000 mg total) by mouth 2 (two) times daily. 40 capsule 0   cholecalciferol (VITAMIN D3) 25 MCG (1000 UNIT) tablet Take 1 tablet (1,000 Units total) by mouth every morning. 90 tablet 1   hydrochlorothiazide (HYDRODIURIL) 25 MG tablet TAKE 1 TABLET (25 MG TOTAL) BY MOUTH DAILY. 180 tablet 3   prazosin (MINIPRESS) 1 MG capsule Take 3 capsules (3 mg total) by mouth at bedtime. 30 capsule 0   sertraline (ZOLOFT) 50 MG tablet Take 3 tablets (150 mg total) by mouth every morning. 180 tablet 1   No current facility-administered medications on file prior to visit.    ROS Comprehensive ROS Pertinent positive and negative noted in HPI    Objective:  Blood Pressure 108/73   Pulse 95   Respiration 16   Weight 253 lb (114.8 kg)   Last Menstrual Period 09/20/2022 (Approximate)   Oxygen Saturation 98%   Body Mass Index 45.54 kg/m   BP Readings from Last 3 Encounters:  10/27/22 108/73  10/24/22 115/78  09/26/22 122/83    Wt Readings from Last 3 Encounters:  10/27/22 253 lb (114.8 kg)  10/24/22 257 lb (116.6 kg)  09/26/22 257 lb 9.6 oz (116.8 kg)    Physical Exam Vitals reviewed.  Constitutional:      Appearance: She is obese.     Comments: Morbid  HENT:     Head:     Comments: Maxillary glands tender and frontal    Right Ear: Tympanic membrane and external  ear normal.     Left Ear: Tympanic membrane and external ear normal.     Nose: Nose normal.  Cardiovascular:     Rate and Rhythm: Normal rate and regular rhythm.  Pulmonary:     Effort: Pulmonary effort is normal.     Breath sounds: Normal breath sounds.  Musculoskeletal:        General: Normal range of motion.     Cervical back: Normal range of motion and neck supple.  Skin:    General: Skin is warm and dry.  Neurological:     General: No focal deficit present.     Mental Status: She is oriented to person, place, and time.    Lab Results   Component Value Date   HGBA1C 6.1 09/26/2022   HGBA1C 5.2 10/25/2017   HGBA1C 5.5 01/02/2016    Lab Results  Component Value Date   WBC 6.5 09/07/2021   HGB 10.6 (L) 09/07/2021   HCT 33.2 (L) 09/07/2021   PLT 281 09/07/2021   GLUCOSE 80 09/07/2021   CHOL 164 04/04/2019   TRIG 129 04/04/2019   HDL 76 04/04/2019   LDLCALC 66 04/04/2019   ALT 21 07/14/2021   AST 24 07/14/2021   NA 144 09/07/2021   K 3.1 (L) 09/07/2021   CL 114 (H) 09/07/2021   CREATININE 0.57 09/07/2021   BUN 9 09/07/2021   CO2 23 09/07/2021   TSH 1.460 04/04/2019   INR 0.9 12/31/2019   HGBA1C 6.1 09/26/2022     Assessment & Plan:  Cally was seen today for prediabetes and medication refill.  Diagnoses and all orders for this visit:  Other acute sinusitis, recurrence not specified -     fluticasone (FLONASE) 50 MCG/ACT nasal spray; Place 2 sprays into both nostrils daily.  Other orders 2/2 medication refill you -     colchicine 0.6 MG tablet; Take 1 tablet (0.6 mg total) by mouth daily as needed (gout pain). -     esomeprazole (NEXIUM) 40 MG capsule; Take 1 capsule (40 mg total) by mouth daily. -     levocetirizine (XYZAL ALLERGY 24HR) 5 MG tablet; Take 1 tablet (5 mg total) by mouth every evening. -     Discontinue: meloxicam (MOBIC) 7.5 MG tablet; Take 1 tablet (7.5 mg total) by mouth daily. -     metFORMIN (GLUCOPHAGE) 500 MG tablet; Take 0.5 tablets (250 mg total) by mouth 2 (two) times daily. -     gabapentin (NEURONTIN) 100 MG capsule; Take 1 capsule (100 mg total) by mouth 3 (three) times daily.    Gina Shelton was seen today for prediabetes and medication refill.  Diagnoses and all orders for this visit:  Medication refill  Other acute sinusitis, recurrence not specified -     fluticasone (FLONASE) 50 MCG/ACT nasal spray; Place 2 sprays into both nostrils daily.  Other orders -     colchicine 0.6 MG tablet; Take 1 tablet (0.6 mg total) by mouth daily as needed (gout pain). -      esomeprazole (NEXIUM) 40 MG capsule; Take 1 capsule (40 mg total) by mouth daily. -     levocetirizine (XYZAL ALLERGY 24HR) 5 MG tablet; Take 1 tablet (5 mg total) by mouth every evening. -     Discontinue: meloxicam (MOBIC) 7.5 MG tablet; Take 1 tablet (7.5 mg total) by mouth daily. -     metFORMIN (GLUCOPHAGE) 500 MG tablet; Take 0.5 tablets (250 mg total) by mouth 2 (two) times daily. -  gabapentin (NEURONTIN) 100 MG capsule; Take 1 capsule (100 mg total) by mouth 3 (three) times daily.   I have discontinued Shameria F. Pro's pantoprazole, meloxicam, and meloxicam. I am also having her start on gabapentin. Additionally, I am having her maintain her Blood Pressure Monitor, albuterol, albuterol, prazosin, cephALEXin, sertraline, cholecalciferol, hydrochlorothiazide, colchicine, esomeprazole, fluticasone, levocetirizine, and metFORMIN.  Meds ordered this encounter  Medications   colchicine 0.6 MG tablet    Sig: Take 1 tablet (0.6 mg total) by mouth daily as needed (gout pain).    Dispense:  90 tablet    Refill:  1    Order Specific Question:   Supervising Provider    Answer:   Quentin Angst [[id number]]   esomeprazole (NEXIUM) 40 MG capsule    Sig: Take 1 capsule (40 mg total) by mouth daily.    Dispense:  90 capsule    Refill:  1    Order Specific Question:   Supervising Provider    Answer:   Quentin Angst [[id number]]   fluticasone (FLONASE) 50 MCG/ACT nasal spray    Sig: Place 2 sprays into both nostrils daily.    Dispense:  16 g    Refill:  3    Order Specific Question:   Supervising Provider    Answer:   Quentin Angst [[id number]]   levocetirizine (XYZAL ALLERGY 24HR) 5 MG tablet    Sig: Take 1 tablet (5 mg total) by mouth every evening.    Dispense:  90 tablet    Refill:  1    Order Specific Question:   Supervising Provider    Answer:   Quentin Angst [[id number]]   DISCONTD: meloxicam (MOBIC) 7.5 MG tablet    Sig: Take 1 tablet (7.5 mg total) by mouth  daily.    Dispense:  90 tablet    Refill:  1    Order Specific Question:   Supervising Provider    Answer:   Quentin Angst [[id number]]   metFORMIN (GLUCOPHAGE) 500 MG tablet    Sig: Take 0.5 tablets (250 mg total) by mouth 2 (two) times daily.    Dispense:  90 tablet    Refill:  1    Order Specific Question:   Supervising Provider    Answer:   Quentin Angst [[id number]]   gabapentin (NEURONTIN) 100 MG capsule    Sig: Take 1 capsule (100 mg total) by mouth 3 (three) times daily.    Dispense:  90 capsule    Refill:  1    Discontinue meloxicam    Order Specific Question:   Supervising Provider    Answer:   Quentin Angst [[id number]]     Follow-up:   Return in about 3 months (around 01/27/2023).  The above assessment and management plan was discussed with the patient. The patient verbalized understanding of and has agreed to the management plan. Patient is aware to call the clinic if symptoms fail to improve or worsen. Patient is aware when to return to the clinic for a follow-up visit. Patient educated on when it is appropriate to go to the emergency department.   Gwinda Passe, NP-C

## 2022-10-28 ENCOUNTER — Other Ambulatory Visit: Payer: Self-pay

## 2022-11-01 ENCOUNTER — Other Ambulatory Visit: Payer: Self-pay

## 2022-11-16 ENCOUNTER — Other Ambulatory Visit: Payer: Self-pay

## 2022-11-16 ENCOUNTER — Emergency Department (HOSPITAL_COMMUNITY)
Admission: EM | Admit: 2022-11-16 | Discharge: 2022-11-16 | Disposition: A | Discharge status: Home / Self Care | Payer: 59 | Attending: Emergency Medicine | Admitting: Emergency Medicine

## 2022-11-16 DIAGNOSIS — Z7984 Long term (current) use of oral hypoglycemic drugs: Secondary | ICD-10-CM | POA: Insufficient documentation

## 2022-11-16 DIAGNOSIS — I10 Essential (primary) hypertension: Secondary | ICD-10-CM | POA: Insufficient documentation

## 2022-11-16 DIAGNOSIS — J449 Chronic obstructive pulmonary disease, unspecified: Secondary | ICD-10-CM | POA: Diagnosis not present

## 2022-11-16 DIAGNOSIS — M79672 Pain in left foot: Secondary | ICD-10-CM | POA: Insufficient documentation

## 2022-11-16 DIAGNOSIS — M79671 Pain in right foot: Secondary | ICD-10-CM | POA: Insufficient documentation

## 2022-11-16 DIAGNOSIS — E119 Type 2 diabetes mellitus without complications: Secondary | ICD-10-CM | POA: Insufficient documentation

## 2022-11-16 LAB — CBC WITH DIFFERENTIAL/PLATELET
Abs Immature Granulocytes: 0.02 10*3/uL (ref 0.00–0.07)
Basophils Absolute: 0 10*3/uL (ref 0.0–0.1)
Basophils Relative: 0 %
Eosinophils Absolute: 0.2 10*3/uL (ref 0.0–0.5)
Eosinophils Relative: 2 %
HCT: 36.2 % (ref 36.0–46.0)
Hemoglobin: 10.2 g/dL — ABNORMAL LOW (ref 12.0–15.0)
Immature Granulocytes: 0 %
Lymphocytes Relative: 25 %
Lymphs Abs: 1.8 10*3/uL (ref 0.7–4.0)
MCH: 21.6 pg — ABNORMAL LOW (ref 26.0–34.0)
MCHC: 28.2 g/dL — ABNORMAL LOW (ref 30.0–36.0)
MCV: 76.5 fL — ABNORMAL LOW (ref 80.0–100.0)
Monocytes Absolute: 0.7 10*3/uL (ref 0.1–1.0)
Monocytes Relative: 9 %
Neutro Abs: 4.5 10*3/uL (ref 1.7–7.7)
Neutrophils Relative %: 64 %
Platelets: 400 10*3/uL (ref 150–400)
RBC: 4.73 MIL/uL (ref 3.87–5.11)
RDW: 18.4 % — ABNORMAL HIGH (ref 11.5–15.5)
WBC: 7.1 10*3/uL (ref 4.0–10.5)
nRBC: 0 % (ref 0.0–0.2)

## 2022-11-16 LAB — COMPREHENSIVE METABOLIC PANEL
ALT: 14 U/L (ref 0–44)
AST: 16 U/L (ref 15–41)
Albumin: 3.6 g/dL (ref 3.5–5.0)
Alkaline Phosphatase: 114 U/L (ref 38–126)
Anion gap: 7 (ref 5–15)
BUN: 9 mg/dL (ref 6–20)
CO2: 26 mmol/L (ref 22–32)
Calcium: 9 mg/dL (ref 8.9–10.3)
Chloride: 103 mmol/L (ref 98–111)
Creatinine, Ser: 0.61 mg/dL (ref 0.44–1.00)
GFR, Estimated: >60 mL/min (ref >60)
Glucose, Bld: 101 mg/dL — ABNORMAL HIGH (ref 70–99)
Potassium: 4.1 mmol/L (ref 3.5–5.1)
Sodium: 136 mmol/L (ref 135–145)
Total Bilirubin: 0.2 mg/dL — ABNORMAL LOW (ref 0.3–1.2)
Total Protein: 6.9 g/dL (ref 6.5–8.1)

## 2022-11-16 LAB — URINALYSIS, ROUTINE W REFLEX MICROSCOPIC
Bilirubin Urine: NEGATIVE
Glucose, UA: NEGATIVE mg/dL
Hgb urine dipstick: NEGATIVE
Ketones, ur: NEGATIVE mg/dL
Leukocytes,Ua: NEGATIVE
Nitrite: NEGATIVE
Protein, ur: NEGATIVE mg/dL
Specific Gravity, Urine: 1.014 (ref 1.005–1.030)
pH: 7 (ref 5.0–8.0)

## 2022-11-16 LAB — VITAMIN B12: Vitamin B-12: 215 pg/mL (ref 180–914)

## 2022-11-16 LAB — HEMOGLOBIN A1C
Hgb A1c MFr Bld: 6.2 % — ABNORMAL HIGH (ref 4.8–5.6)
Mean Plasma Glucose: 131.24 mg/dL

## 2022-11-16 NOTE — ED Provider Notes (Signed)
[place] EMERGENCY DEPARTMENT AT [facility] Provider Note   CSN: [id number] Arrival date & time: 11/16/22  0865     History  Chief Complaint  Patient presents with   Foot Pain   Leg Pain    Gina Shelton is a 47 y.o. female with past medical history of polysubstance abuse, EtOH abuse, sciatica, COPD, hypertension, non-insulin-dependent diabetic presents to the emergency department complaining of "burning" in her feet bilaterally that has been occurring over the past month.  She recently started working 1 month ago at [employer] after she was incarcerated for a year and out of work this past year. She also gained about 100 pounds over the past year while out of work and lived a mostly sedentary lifestyle. At [employer], she reports that she is on her feet all day but requires frequent rest breaks due to pain in feet.   She was seen on 10/24/2022 in the emergency department for similar pain to bilateral feet and was given a prednisone burst without relief.   Foot Pain Pertinent negatives include no chest pain, no abdominal pain and no shortness of breath.  Leg Pain Associated symptoms: no back pain and no fever        Home Medications Prior to Admission medications   Medication Sig Start Date End Date Taking? Authorizing Provider  albuterol (VENTOLIN HFA) 108 (90 Base) MCG/ACT inhaler INHALE 2 PUFFS INTO THE LUNGS EVERY 6 (SIX) HOURS AS NEEDED FOR WHEEZING OR SHORTNESS OF BREATH. 02/28/20 02/27/21  Grayce Sessions, NP  albuterol (VENTOLIN HFA) 108 (90 Base) MCG/ACT inhaler Inhale 2 puffs into the lungs every 4 (four) hours as needed for wheezing or shortness of breath. 01/25/21   Valinda Hoar, NP  Blood Pressure Monitor KIT 1 kit by Does not apply route 3 (three) times daily as needed. 04/04/19   Grayce Sessions, NP  cephALEXin (KEFLEX) 500 MG capsule Take 2 capsules (1,000 mg total) by mouth 2 (two) times daily. 09/22/22   Rodriguez-Southworth, Nettie Elm, PA-C   cholecalciferol (VITAMIN D3) 25 MCG (1000 UNIT) tablet Take 1 tablet (1,000 Units total) by mouth every morning. 09/26/22   Grayce Sessions, NP  colchicine 0.6 MG tablet Take 1 tablet (0.6 mg total) by mouth daily as needed (gout pain). 10/27/22   Grayce Sessions, NP  esomeprazole (NEXIUM) 40 MG capsule Take 1 capsule (40 mg total) by mouth daily. 10/27/22   Grayce Sessions, NP  fluticasone (FLONASE) 50 MCG/ACT nasal spray Place 2 sprays into both nostrils daily. 10/27/22   Grayce Sessions, NP  gabapentin (NEURONTIN) 100 MG capsule Take 1 capsule (100 mg total) by mouth 3 (three) times daily. 10/27/22   Grayce Sessions, NP  hydrochlorothiazide (HYDRODIURIL) 25 MG tablet TAKE 1 TABLET (25 MG TOTAL) BY MOUTH DAILY. 09/26/22 09/26/23  Grayce Sessions, NP  levocetirizine (XYZAL ALLERGY 24HR) 5 MG tablet Take 1 tablet (5 mg total) by mouth every evening. 10/27/22   Grayce Sessions, NP  metFORMIN (GLUCOPHAGE) 500 MG tablet Take 0.5 tablets (250 mg total) by mouth 2 (two) times daily. 10/27/22   Grayce Sessions, NP  prazosin (MINIPRESS) 1 MG capsule Take 3 capsules (3 mg total) by mouth at bedtime. 09/22/22   Rodriguez-Southworth, Nettie Elm, PA-C  sertraline (ZOLOFT) 50 MG tablet Take 3 tablets (150 mg total) by mouth every morning. 09/26/22   Grayce Sessions, NP      Allergies    Patient has no known allergies.  Review of Systems   Review of Systems  Constitutional:  Negative for chills and fever.  HENT:  Negative for sneezing and sore throat.   Respiratory:  Negative for cough, chest tightness and shortness of breath.   Cardiovascular:  Negative for chest pain, palpitations and leg swelling.  Gastrointestinal:  Negative for abdominal pain, diarrhea, nausea and vomiting.  Musculoskeletal:  Negative for arthralgias and back pain.       " Burning" sensation and pain to soles of feet bilaterally  Skin:  Negative for color change and wound.  Neurological:  Negative for  seizures, syncope and weakness.    Physical Exam Updated Vital Signs BP 110/82   Pulse 98   Temp 99 F (37.2 C) (Oral)   Resp 18   Ht 5' 2.5" (1.588 m)   Wt 113.4 kg   LMP 11/11/2022   SpO2 99%   BMI 45.00 kg/m  Physical Exam Vitals and nursing note reviewed.  Constitutional:      General: She is not in acute distress.    Appearance: Normal appearance. She is not ill-appearing.  HENT:     Head: Normocephalic and atraumatic.  Eyes:     General: No scleral icterus.    Conjunctiva/sclera: Conjunctivae normal.  Cardiovascular:     Rate and Rhythm: Normal rate.     Pulses: Normal pulses.  Pulmonary:     Effort: Pulmonary effort is normal.     Breath sounds: Normal breath sounds.  Abdominal:     General: There is no distension.     Palpations: Abdomen is soft.     Tenderness: There is no abdominal tenderness. There is no rebound.  Musculoskeletal:        General: Normal range of motion.     Right foot: Normal range of motion.     Left foot: Normal range of motion.  Feet:     Right foot:     Skin integrity: Skin integrity normal.     Left foot:     Skin integrity: Skin integrity normal.     Comments: Equal motor and sensation to feet bilaterally Normal range of motion Diffuse tenderness to palpation of feet bilaterally Skin:    General: Skin is warm.     Capillary Refill: Capillary refill takes less than 2 seconds.     Coloration: Skin is not jaundiced or pale.  Neurological:     Mental Status: She is alert. Mental status is at baseline.     Cranial Nerves: No cranial nerve deficit.     ED Results / Procedures / Treatments   Labs (all labs ordered are listed, but only abnormal results are displayed) Labs Reviewed  HEMOGLOBIN A1C - Abnormal; Notable for the following components:      Result Value   Hgb A1c MFr Bld 6.2 (*)    All other components within normal limits  CBC WITH DIFFERENTIAL/PLATELET - Abnormal; Notable for the following components:    Hemoglobin 10.2 (*)    MCV 76.5 (*)    MCH 21.6 (*)    MCHC 28.2 (*)    RDW 18.4 (*)    All other components within normal limits  COMPREHENSIVE METABOLIC PANEL - Abnormal; Notable for the following components:   Glucose, Bld 101 (*)    Total Bilirubin 0.2 (*)    All other components within normal limits  URINALYSIS, ROUTINE W REFLEX MICROSCOPIC - Abnormal; Notable for the following components:   APPearance HAZY (*)    All other components within  normal limits  VITAMIN B12  CBG MONITORING, ED    EKG None  Radiology No results found.  Procedures Procedures    Medications Ordered in ED Medications - No data to display  ED Course/ Medical Decision Making/ A&P                                 Medical Decision Making Amount and/or Complexity of Data Reviewed Labs: ordered.   Gina Shelton is a 47 year old female who presents to the emergency department for "burning " pain in her feet bilaterally for the past month.  See HPI for further details  Upon evaluation, patient is sitting comfortably in bed.  She has equal pulses, motor function, and sensation to both feet.  She denies swelling and pain to calves or recent injuries. She reports that her sugar is controlled with metformin but does not check her sugar at home. She currently takes 100 mg 3 times daily.   Lab work is not significant for infectious etiology or acute abnormalities to explain symptoms.  Glucose is 101.  A1c 6.2.  B12 215.   Patient is stable for discharge and to follow-up with PCP regarding management of diabetes and neuropathy.  Return precautions to ED provided.  Supportive care to include frequent rest breaks, supportive shoes, compliance with medication discussed.  Discussed importance of healthy diet and strict glucose control.  Outpatient neurology consult provided for patient for further workup on continued symptoms of peripheral neuropathy.  PCP follow-up for routine health maintenance and medication  management. treatment and disposition plan explained to patient.  Patient expresses understanding and agrees with treatment plan.        Final Clinical Impression(s) / ED Diagnoses Final diagnoses:  Foot pain, bilateral    Rx / DC Orders ED Discharge Orders          Ordered    Ambulatory referral to Neurology       Comments: An appointment is requested for continued peripheral neuropathy   11/16/22 1259              Judithann Sheen, [place] 11/16/22 1326    Eber Hong, MD 11/17/22 [phone]

## 2022-11-16 NOTE — Discharge Instructions (Addendum)
Call [facility] neurology for follow-up for continued peripheral neuropathy symptoms  Follow-up with PCP as needed for routine health maintenance and medication management

## 2022-11-16 NOTE — ED Triage Notes (Signed)
Pt. Stated, Im having foot pain on the bottom , my left leg in pain and fels swollen, this started about a month ago.

## 2022-11-17 ENCOUNTER — Ambulatory Visit (INDEPENDENT_AMBULATORY_CARE_PROVIDER_SITE_OTHER): Payer: Self-pay

## 2022-11-17 NOTE — Telephone Encounter (Signed)
Chief Complaint: Foot Pain  Symptoms: Bilateral foot burning, tingling, numbness. Left ankle and foot swollen 10/10 pain  Frequency: constant ongoing x 1 month  Pertinent Negatives: Patient denies chest pain, nausea, vomiting Disposition: [] ED /[] Urgent Care (no appt availability in office) / [x] Appointment(In office/virtual)/ []  [facility]/ [] Home Care/ [] Refused Recommended Disposition /[] [facility]/ []  Follow-up with PCP Additional Notes: Patient states she has bilateral foot that is burning, tingling, numbness and left foot swollen from ankle down to the foot. Patient reports 10/10 on pain scale. Patient states the gabapentin and over the counter pain medication does not improve the pain or burning, tingling and numbness she is felling. Patient states she went to the ED yesterday and they recommended she follow-up with PCP. Patient has been scheduled for hospital follow-up on 11/22/22. Care advise given and advised patient to go to Urgent care for further evaluation of medication. Patient states she will go to Urgent if symptoms continue as a walk-in since she has to work.  Reason for Disposition  [1] SEVERE pain (e.g., excruciating, unable to do any normal activities) AND [2] not improved after 2 hours of pain medicine  Answer Assessment - Initial Assessment Questions 1. ONSET: "When did the pain start?"      Onset about 1 month  2. LOCATION: "Where is the pain located?"      Bilateral feet  3. PAIN: "How bad is the pain?"    (Scale 1-10; or mild, moderate, severe)  - MILD (1-3): doesn't interfere with normal activities.   - MODERATE (4-7): interferes with normal activities (e.g., work or school) or awakens from sleep, limping.   - SEVERE (8-10): excruciating pain, unable to do any normal activities, unable to walk.      10/10 4. WORK OR EXERCISE: "Has there been any recent work or exercise that involved this part of the body?"      No  5. CAUSE: "What do you think  is causing the foot pain?"     Diabetic Neuropathy  6. OTHER SYMPTOMS: "Do you have any other symptoms?" (e.g., leg pain, rash, fever, numbness)     Burning, tingling, Numbness in the toes. Left ankle and foot swollen  Protocols used: Foot Pain-A-AH

## 2022-11-22 ENCOUNTER — Encounter (INDEPENDENT_AMBULATORY_CARE_PROVIDER_SITE_OTHER): Payer: Self-pay | Admitting: Primary Care

## 2022-11-22 ENCOUNTER — Ambulatory Visit (INDEPENDENT_AMBULATORY_CARE_PROVIDER_SITE_OTHER): Payer: 59 | Admitting: Primary Care

## 2022-11-22 ENCOUNTER — Other Ambulatory Visit: Payer: Self-pay

## 2022-11-22 ENCOUNTER — Other Ambulatory Visit (INDEPENDENT_AMBULATORY_CARE_PROVIDER_SITE_OTHER): Payer: Self-pay

## 2022-11-22 ENCOUNTER — Telehealth (INDEPENDENT_AMBULATORY_CARE_PROVIDER_SITE_OTHER): Payer: Self-pay | Admitting: Primary Care

## 2022-11-22 VITALS — BP 118/76 | HR 117 | Resp 16 | Wt 247.4 lb

## 2022-11-22 DIAGNOSIS — M778 Other enthesopathies, not elsewhere classified: Secondary | ICD-10-CM | POA: Diagnosis not present

## 2022-11-22 MED ORDER — IBUPROFEN 600 MG PO TABS: 600.0000 mg | ORAL_TABLET | Freq: Three times a day (TID) | ORAL | 0 refills | Status: DC | PRN

## 2022-11-22 MED ORDER — IBUPROFEN 600 MG PO TABS
600.0000 mg | ORAL_TABLET | Freq: Three times a day (TID) | ORAL | 0 refills | Status: DC | PRN
  Filled 2022-11-22: qty 90, 30d supply, fill #0

## 2022-11-22 NOTE — Progress Notes (Signed)
[facility]  Gina Shelton, is a 47 y.o. female  TDV:[id number]  GGY:[id number]  DOB - 1975-10-20  Chief Complaint  Patient presents with   Hospitalization Follow-up    ED       Subjective:   Gina Shelton is a 47 y.o. female here today for a follow up visit. Patient has No headache, No chest pain, No abdominal pain - No Nausea, No new weakness tingling or numbness, No Cough - shortness of breath  No problems updated.  No Known Allergies  Past Medical History:  Diagnosis Date   Anxiety    COPD (chronic obstructive pulmonary disease) (HCC)    Depression    Depression    Diabetes mellitus without complication (HCC)    GERD (gastroesophageal reflux disease)    Gout    ble   Hypertension    Migraine    Migraine     Current Outpatient Medications on File Prior to Visit  Medication Sig Dispense Refill   albuterol (VENTOLIN HFA) 108 (90 Base) MCG/ACT inhaler INHALE 2 PUFFS INTO THE LUNGS EVERY 6 (SIX) HOURS AS NEEDED FOR WHEEZING OR SHORTNESS OF BREATH. 8.5 g 1   albuterol (VENTOLIN HFA) 108 (90 Base) MCG/ACT inhaler Inhale 2 puffs into the lungs every 4 (four) hours as needed for wheezing or shortness of breath. 18 g 1   Blood Pressure Monitor KIT 1 kit by Does not apply route 3 (three) times daily as needed. 1 kit 0   cholecalciferol (VITAMIN D3) 25 MCG (1000 UNIT) tablet Take 1 tablet (1,000 Units total) by mouth every morning. 90 tablet 1   colchicine 0.6 MG tablet Take 1 tablet (0.6 mg total) by mouth daily as needed (gout pain). 90 tablet 1   esomeprazole (NEXIUM) 40 MG capsule Take 1 capsule (40 mg total) by mouth daily. 90 capsule 1   fluticasone (FLONASE) 50 MCG/ACT nasal spray Place 2 sprays into both nostrils daily. 16 g 3   gabapentin (NEURONTIN) 100 MG capsule Take 1 capsule (100 mg total) by mouth 3 (three) times daily. 90 capsule 1   hydrochlorothiazide (HYDRODIURIL) 25 MG tablet TAKE 1 TABLET (25 MG TOTAL) BY MOUTH DAILY. 180 tablet 3    levocetirizine (XYZAL ALLERGY 24HR) 5 MG tablet Take 1 tablet (5 mg total) by mouth every evening. 90 tablet 1   metFORMIN (GLUCOPHAGE) 500 MG tablet Take 0.5 tablets (250 mg total) by mouth 2 (two) times daily. 90 tablet 1   prazosin (MINIPRESS) 1 MG capsule Take 3 capsules (3 mg total) by mouth at bedtime. 30 capsule 0   sertraline (ZOLOFT) 50 MG tablet Take 3 tablets (150 mg total) by mouth every morning. 180 tablet 1   No current facility-administered medications on file prior to visit.    Objective:   Vitals:   11/22/22 1523  BP: 118/76  Pulse: (!) 117  Resp: 16  SpO2: 95%  Weight: 247 lb 6.4 oz (112.2 kg)    Comprehensive ROS Pertinent positive and negative noted in HPI   Exam General appearance : Awake, alert, not in any distress. Speech Clear. Not toxic looking HEENT: Atraumatic and Normocephalic, pupils equally reactive to light and accomodation Neck: Supple, no JVD. No cervical lymphadenopathy.  Chest: Good air entry bilaterally, no added sounds  CVS: S1 S2 regular, no murmurs.  Abdomen: Bowel sounds present, Non tender and not distended with no gaurding, rigidity or rebound. Extremities: B/L Lower Ext shows no edema, both legs are warm to touch Neurology: Awake alert, and oriented  X 3,  Non focal Skin: No Rash  Data Review Lab Results  Component Value Date   HGBA1C 6.2 (H) 11/16/2022   HGBA1C 6.1 09/26/2022   HGBA1C 5.2 10/25/2017    Assessment & Plan  Gina Shelton was seen today for hospitalization follow-up.  Diagnoses and all orders for this visit:  Tendinitis of both feet -     Ambulatory referral to Podiatry      Patient have been counseled extensively about nutrition and exercise. Other issues discussed during this visit include: low cholesterol diet, weight control and daily exercise, foot care, annual eye examinations at Ophthalmology, importance of adherence with medications and regular follow-up. We also discussed long term complications of  uncontrolled diabetes and hypertension.   Return in about 3 months (around 02/21/2023) for medical conditions.  The patient was given clear instructions to go to ER or return to medical center if symptoms don't improve, worsen or new problems develop. The patient verbalized understanding. The patient was told to call to get lab results if they haven't heard anything in the next week.   This note has been created with Education officer, environmental. Any transcriptional errors are unintentional.   Grayce Sessions, NP 11/25/2022, 5:00 PM

## 2022-11-22 NOTE — Telephone Encounter (Signed)
Pt is calling in because she says Marcelino Duster sent her medication to the wrong pharmacy.Pt says she wanted it to go to the [facility] on [address]. ibuprofen (ADVIL) 600 MG tablet [[id number]]

## 2022-11-22 NOTE — Telephone Encounter (Signed)
Rx has been resent 

## 2022-11-23 ENCOUNTER — Other Ambulatory Visit: Payer: Self-pay

## 2022-11-28 ENCOUNTER — Ambulatory Visit (INDEPENDENT_AMBULATORY_CARE_PROVIDER_SITE_OTHER): Payer: 59 | Admitting: Primary Care

## 2022-12-02 ENCOUNTER — Other Ambulatory Visit: Payer: Self-pay

## 2022-12-02 ENCOUNTER — Ambulatory Visit: Payer: 59

## 2022-12-02 ENCOUNTER — Ambulatory Visit: Payer: 59 | Admitting: Podiatry

## 2022-12-02 DIAGNOSIS — E1142 Type 2 diabetes mellitus with diabetic polyneuropathy: Secondary | ICD-10-CM

## 2022-12-02 DIAGNOSIS — M7662 Achilles tendinitis, left leg: Secondary | ICD-10-CM

## 2022-12-02 DIAGNOSIS — M7750 Other enthesopathy of unspecified foot: Secondary | ICD-10-CM

## 2022-12-02 DIAGNOSIS — M7661 Achilles tendinitis, right leg: Secondary | ICD-10-CM | POA: Diagnosis not present

## 2022-12-02 MED ORDER — GABAPENTIN 300 MG PO CAPS
300.0000 mg | ORAL_CAPSULE | Freq: Two times a day (BID) | ORAL | 2 refills | Status: DC
  Filled 2022-12-02: qty 60, 30d supply, fill #0
  Filled 2023-06-09: qty 60, 30d supply, fill #1
  Filled 2023-07-14: qty 60, 30d supply, fill #2

## 2022-12-02 MED ORDER — MELOXICAM 15 MG PO TABS
15.0000 mg | ORAL_TABLET | Freq: Every day | ORAL | 0 refills | Status: DC
  Filled 2022-12-02: qty 30, 30d supply, fill #0

## 2022-12-02 MED ORDER — METHYLPREDNISOLONE 4 MG PO TBPK
ORAL_TABLET | ORAL | 0 refills | Status: DC
  Filled 2022-12-02: qty 21, 6d supply, fill #0

## 2022-12-02 NOTE — Progress Notes (Signed)
Subjective:  Patient ID: Gina Shelton, female    DOB: 25-Dec-1975,  MRN: [id number]  Chief Complaint  Patient presents with   Peripheral Neuropathy    Patient is c/o numbness to her toes, pins and needles sensation to the medial aspect of foot, sharp and throbbing pain when she is on her feet. She is currently taking Gabapentin 100mg  TID prescribed by PCP.   Latest A1C 6.2 (Sep 2024)    47 y.o. female presents with concern for numbness burning pins and needle sensation of both feet.  Patient does have a history of diabetes.  She is taking gabapentin 100 mg 3 times daily prescribed by her PCP.  Additionally patient notes some pain in the back of her ankles near the Achilles and has difficulty moving her toes towards her shin.  This causes increased pain.  She does report she has a history of dropfoot does not have any bracing for this.  Recently purchased a brace though for her right ankle.  Past Medical History:  Diagnosis Date   Anxiety    COPD (chronic obstructive pulmonary disease) (HCC)    Depression    Depression    Diabetes mellitus without complication (HCC)    GERD (gastroesophageal reflux disease)    Gout    ble   Hypertension    Migraine    Migraine     No Known Allergies  ROS: Negative except as per HPI above  Objective:  General: AAO x3, NAD  Dermatological: With inspection and palpation of the right and left lower extremities there are no open sores, no preulcerative lesions, no rash or signs of infection present. Nails are of normal length thickness and coloration.   Vascular:  Dorsalis Pedis artery and Posterior Tibial artery pedal pulses are 2/4 bilateral.  Capillary fill time < 3 sec to all digits.   Neruologic: Grossly intact via light touch bilateral. Protective threshold diminished  Musculoskeletal: Decreased motion bilateral ankle pain increased with dorsiflexion range of motion.  Pain does occur at the Achilles with any ankle dorsiflexion.   Gastrocnemius equinus present with positive Silfverskiold test.  Pain with palpation of the plantar heel as well bilaterally centrally near the plantar calcaneal bursa.  Gait: Unassisted, Nonantalgic.   No images are attached to the encounter.  Radiographs:  Deferred Assessment:   1. Tendinitis of ankle or foot   2. Achilles tendinitis of both lower extremities   3. DM type 2 with diabetic peripheral neuropathy (HCC)      Plan:  Patient was evaluated and treated and all questions answered.  # Achilles tendinitis and generalized myopathy of bilateral lower extremity -Recommend course of steroid treatment for neuropathy -E Rx for methylprednisone 4 mg steroid taper pack take as directed for 6 days discussed the side effects associate with this drug and risk of increased glucose levels -Recommend night splint for the right lower extremity and this was dispensed to the patient this visit -E Rx for meloxicam 15 mg take once daily for the next 30 days, patient states meloxicam has been successful for her in the past for these issues  # Neuropathy of bilateral lower extremity -Strong suspicion of neuropathy bilateral lower extremity secondary to diabetes -Recommend increasing gabapentin from 100 mg 3 times daily to 300 mg 3 times daily -Discussed risk and side effects associate with this medication  Return in about 6 weeks (around 01/13/2023) for f/u neuropathy, bilateral achiles tendinitis.          Corinna Gab, DPM  [facility] / [facility]

## 2022-12-02 NOTE — Patient Instructions (Signed)
Plantar Fasciitis (Heel Spur Syndrome) with Rehab The plantar fascia is a fibrous, ligament-like, soft-tissue structure that spans the bottom of the foot. Plantar fasciitis is a condition that causes pain in the foot due to inflammation of the tissue. SYMPTOMS   Pain and tenderness on the underneath side of the foot.  Pain that worsens with standing or walking. CAUSES  Plantar fasciitis is caused by irritation and injury to the plantar fascia on the underneath side of the foot. Common mechanisms of injury include:  Direct trauma to bottom of the foot.  Damage to a small nerve that runs under the foot where the main fascia attaches to the heel bone.  Stress placed on the plantar fascia due to bone spurs. RISK INCREASES WITH:   Activities that place stress on the plantar fascia (running, jumping, pivoting, or cutting).  Poor strength and flexibility.  Improperly fitted shoes.  Tight calf muscles.  Flat feet.  Failure to warm-up properly before activity.  Obesity. PREVENTION  Warm up and stretch properly before activity.  Allow for adequate recovery between workouts.  Maintain physical fitness:  Strength, flexibility, and endurance.  Cardiovascular fitness.  Maintain a health body weight.  Avoid stress on the plantar fascia.  Wear properly fitted shoes, including arch supports for individuals who have flat feet.  PROGNOSIS  If treated properly, then the symptoms of plantar fasciitis usually resolve without surgery. However, occasionally surgery is necessary.  RELATED COMPLICATIONS   Recurrent symptoms that may result in a chronic condition.  Problems of the lower back that are caused by compensating for the injury, such as limping.  Pain or weakness of the foot during push-off following surgery.  Chronic inflammation, scarring, and partial or complete fascia tear, occurring more often from repeated injections.  TREATMENT  Treatment initially involves the  use of ice and medication to help reduce pain and inflammation. The use of strengthening and stretching exercises may help reduce pain with activity, especially stretches of the Achilles tendon. These exercises may be performed at home or with a therapist. Your caregiver may recommend that you use heel cups of arch supports to help reduce stress on the plantar fascia. Occasionally, corticosteroid injections are given to reduce inflammation. If symptoms persist for greater than 6 months despite non-surgical (conservative), then surgery may be recommended.   MEDICATION   If pain medication is necessary, then nonsteroidal anti-inflammatory medications, such as aspirin and ibuprofen, or other minor pain relievers, such as acetaminophen, are often recommended.  Do not take pain medication within 7 days before surgery.  Prescription pain relievers may be given if deemed necessary by your caregiver. Use only as directed and only as much as you need.  Corticosteroid injections may be given by your caregiver. These injections should be reserved for the most serious cases, because they may only be given a certain number of times.  HEAT AND COLD  Cold treatment (icing) relieves pain and reduces inflammation. Cold treatment should be applied for 10 to 15 minutes every 2 to 3 hours for inflammation and pain and immediately after any activity that aggravates your symptoms. Use ice packs or massage the area with a piece of ice (ice massage).  Heat treatment may be used prior to performing the stretching and strengthening activities prescribed by your caregiver, physical therapist, or athletic trainer. Use a heat pack or soak the injury in warm water.  SEEK IMMEDIATE MEDICAL CARE IF:  Treatment seems to offer no benefit, or the condition worsens.  Any medications  produce adverse side effects.  EXERCISES- RANGE OF MOTION (ROM) AND STRETCHING EXERCISES - Plantar Fasciitis (Heel Spur Syndrome) These exercises  may help you when beginning to rehabilitate your injury. Your symptoms may resolve with or without further involvement from your physician, physical therapist or athletic trainer. While completing these exercises, remember:   Restoring tissue flexibility helps normal motion to return to the joints. This allows healthier, less painful movement and activity.  An effective stretch should be held for at least 30 seconds.  A stretch should never be painful. You should only feel a gentle lengthening or release in the stretched tissue.  RANGE OF MOTION - Toe Extension, Flexion  Sit with your right / left leg crossed over your opposite knee.  Grasp your toes and gently pull them back toward the top of your foot. You should feel a stretch on the bottom of your toes and/or foot.  Hold this stretch for 10 seconds.  Now, gently pull your toes toward the bottom of your foot. You should feel a stretch on the top of your toes and or foot.  Hold this stretch for 10 seconds. Repeat  times. Complete this stretch 3 times per day.   RANGE OF MOTION - Ankle Dorsiflexion, Active Assisted  Remove shoes and sit on a chair that is preferably not on a carpeted surface.  Place right / left foot under knee. Extend your opposite leg for support.  Keeping your heel down, slide your right / left foot back toward the chair until you feel a stretch at your ankle or calf. If you do not feel a stretch, slide your bottom forward to the edge of the chair, while still keeping your heel down.  Hold this stretch for 10 seconds. Repeat 3 times. Complete this stretch 2 times per day.   STRETCH  Gastroc, Standing  Place hands on wall.  Extend right / left leg, keeping the front knee somewhat bent.  Slightly point your toes inward on your back foot.  Keeping your right / left heel on the floor and your knee straight, shift your weight toward the wall, not allowing your back to arch.  You should feel a gentle stretch  in the right / left calf. Hold this position for 10 seconds. Repeat 3 times. Complete this stretch 2 times per day.  STRETCH  Soleus, Standing  Place hands on wall.  Extend right / left leg, keeping the other knee somewhat bent.  Slightly point your toes inward on your back foot.  Keep your right / left heel on the floor, bend your back knee, and slightly shift your weight over the back leg so that you feel a gentle stretch deep in your back calf.  Hold this position for 10 seconds. Repeat 3 times. Complete this stretch 2 times per day.  STRETCH  Gastrocsoleus, Standing  Note: This exercise can place a lot of stress on your foot and ankle. Please complete this exercise only if specifically instructed by your caregiver.   Place the ball of your right / left foot on a step, keeping your other foot firmly on the same step.  Hold on to the wall or a rail for balance.  Slowly lift your other foot, allowing your body weight to press your heel down over the edge of the step.  You should feel a stretch in your right / left calf.  Hold this position for 10 seconds.  Repeat this exercise with a slight bend in your right /  left knee. Repeat 3 times. Complete this stretch 2 times per day.   STRENGTHENING EXERCISES - Plantar Fasciitis (Heel Spur Syndrome)  These exercises may help you when beginning to rehabilitate your injury. They may resolve your symptoms with or without further involvement from your physician, physical therapist or athletic trainer. While completing these exercises, remember:   Muscles can gain both the endurance and the strength needed for everyday activities through controlled exercises.  Complete these exercises as instructed by your physician, physical therapist or athletic trainer. Progress the resistance and repetitions only as guided.  STRENGTH - Towel Curls  Sit in a chair positioned on a non-carpeted surface.  Place your foot on a towel, keeping your heel  on the floor.  Pull the towel toward your heel by only curling your toes. Keep your heel on the floor. Repeat 3 times. Complete this exercise 2 times per day.  STRENGTH - Ankle Inversion  Secure one end of a rubber exercise band/tubing to a fixed object (table, pole). Loop the other end around your foot just before your toes.  Place your fists between your knees. This will focus your strengthening at your ankle.  Slowly, pull your big toe up and in, making sure the band/tubing is positioned to resist the entire motion.  Hold this position for 10 seconds.  Have your muscles resist the band/tubing as it slowly pulls your foot back to the starting position. Repeat 3 times. Complete this exercises 2 times per day.  Document Released: 02/28/2005 Document Revised: 05/23/2011 Document Reviewed: 06/12/2008 [facility] Patient Information 2014 [place], [place].   Achilles Tendinitis  with Rehab Achilles tendinitis is a disorder of the Achilles tendon. The Achilles tendon connects the large calf muscles (Gastrocnemius and Soleus) to the heel bone (calcaneus). This tendon is sometimes called the heel cord. It is important for pushing-off and standing on your toes and is important for walking, running, or jumping. Tendinitis is often caused by overuse and repetitive microtrauma. SYMPTOMS  Pain, tenderness, swelling, warmth, and redness may occur over the Achilles tendon even at rest.  Pain with pushing off, or flexing or extending the ankle.  Pain that is worsened after or during activity. CAUSES   Overuse sometimes seen with rapid increase in exercise programs or in sports requiring running and jumping.  Poor physical conditioning (strength and flexibility or endurance).  Running sports, especially training running down hills.  Inadequate warm-up before practice or play or failure to stretch before participation.  Injury to the tendon. PREVENTION   Warm up and stretch before practice or  competition.  Allow time for adequate rest and recovery between practices and competition.  Keep up conditioning.  Keep up ankle and leg flexibility.  Improve or keep muscle strength and endurance.  Improve cardiovascular fitness.  Use proper technique.  Use proper equipment (shoes, skates).  To help prevent recurrence, taping, protective strapping, or an adhesive bandage may be recommended for several weeks after healing is complete. PROGNOSIS   Recovery may take weeks to several months to heal.  Longer recovery is expected if symptoms have been prolonged.  Recovery is usually quicker if the inflammation is due to a direct blow as compared with overuse or sudden strain. RELATED COMPLICATIONS   Healing time will be prolonged if the condition is not correctly treated. The injury must be given plenty of time to heal.  Symptoms can reoccur if activity is resumed too soon.  Untreated, tendinitis may increase the risk of tendon rupture requiring additional time  for recovery and possibly surgery. TREATMENT   The first treatment consists of rest anti-inflammatory medication, and ice to relieve the pain.  Stretching and strengthening exercises after resolution of pain will likely help reduce the risk of recurrence. Referral to a physical therapist or athletic trainer for further evaluation and treatment may be helpful.  A walking boot or cast may be recommended to rest the Achilles tendon. This can help break the cycle of inflammation and microtrauma.  Arch supports (orthotics) may be prescribed or recommended by your caregiver as an adjunct to therapy and rest.  Surgery to remove the inflamed tendon lining or degenerated tendon tissue is rarely necessary and has shown less than predictable results. MEDICATION   Nonsteroidal anti-inflammatory medications, such as aspirin and ibuprofen, may be used for pain and inflammation relief. Do not take within 7 days before surgery. Take  these as directed by your caregiver. Contact your caregiver immediately if any bleeding, stomach upset, or signs of allergic reaction occur. Other minor pain relievers, such as acetaminophen, may also be used.  Pain relievers may be prescribed as necessary by your caregiver. Do not take prescription pain medication for longer than 4 to 7 days. Use only as directed and only as much as you need. HEAT AND COLD  Cold is used to relieve pain and reduce inflammation for acute and chronic Achilles tendinitis. Cold should be applied for 10 to 15 minutes every 2 to 3 hours for inflammation and pain and immediately after any activity that aggravates your symptoms. Use ice packs or an ice massage.  Heat may be used before performing stretching and strengthening activities prescribed by your caregiver. Use a heat pack or a warm soak. SEEK MEDICAL CARE IF:  Symptoms get worse or do not improve in 2 weeks despite treatment.  New, unexplained symptoms develop. Drugs used in treatment may produce side effects.   EXERCISES-- hold each stretch for 30 seconds and repeat 10 times.  Complete each stretch 3 times per day.   RANGE OF MOTION (ROM) AND STRETCHING EXERCISES - Achilles Tendinitis  These exercises may help you when beginning to rehabilitate your injury. Your symptoms may resolve with or without further involvement from your physician, physical therapist or athletic trainer. While completing these exercises, remember:   Restoring tissue flexibility helps normal motion to return to the joints. This allows healthier, less painful movement and activity.  An effective stretch should be held for at least 30 seconds.  A stretch should never be painful. You should only feel a gentle lengthening or release in the stretched tissue. STRETCH  Gastroc, Standing   Place hands on wall.  Extend right / left leg, keeping the front knee somewhat bent.  Slightly point your toes inward on your back foot.  Keeping  your right / left heel on the floor and your knee straight, shift your weight toward the wall, not allowing your back to arch.  You should feel a gentle stretch in the right / left calf. Hold this position for __________ seconds. Repeat __________ times. Complete this stretch __________ times per day. STRETCH  Soleus, Standing   Place hands on wall.  Extend right / left leg, keeping the other knee somewhat bent.  Slightly point your toes inward on your back foot.  Keep your right / left heel on the floor, bend your back knee, and slightly shift your weight over the back leg so that you feel a gentle stretch deep in your back calf.  Hold this  position for __________ seconds. Repeat __________ times. Complete this stretch __________ times per day. STRETCH  Gastrocsoleus, Standing  Note: This exercise can place a lot of stress on your foot and ankle. Please complete this exercise only if specifically instructed by your caregiver.   Place the ball of your right / left foot on a step, keeping your other foot firmly on the same step.  Hold on to the wall or a rail for balance.  Slowly lift your other foot, allowing your body weight to press your heel down over the edge of the step.  You should feel a stretch in your right / left calf.  Hold this position for __________ seconds.  Repeat this exercise with a slight bend in your knee. Repeat __________ times. Complete this stretch __________ times per day.  STRENGTHENING EXERCISES - Achilles Tendinitis These exercises may help you when beginning to rehabilitate your injury. They may resolve your symptoms with or without further involvement from your physician, physical therapist or athletic trainer. While completing these exercises, remember:   Muscles can gain both the endurance and the strength needed for everyday activities through controlled exercises.  Complete these exercises as instructed by your physician, physical therapist or  athletic trainer. Progress the resistance and repetitions only as guided.  You may experience muscle soreness or fatigue, but the pain or discomfort you are trying to eliminate should never worsen during these exercises. If this pain does worsen, stop and make certain you are following the directions exactly. If the pain is still present after adjustments, discontinue the exercise until you can discuss the trouble with your clinician. STRENGTH - Plantar-flexors   Sit with your right / left leg extended. Holding onto both ends of a rubber exercise band/tubing, loop it around the ball of your foot. Keep a slight tension in the band.  Slowly push your toes away from you, pointing them downward.  Hold this position for __________ seconds. Return slowly, controlling the tension in the band/tubing. Repeat __________ times. Complete this exercise __________ times per day.  STRENGTH - Plantar-flexors   Stand with your feet shoulder width apart. Steady yourself with a wall or table using as little support as needed.  Keeping your weight evenly spread over the width of your feet, rise up on your toes.*  Hold this position for __________ seconds. Repeat __________ times. Complete this exercise __________ times per day.  *If this is too easy, shift your weight toward your right / left leg until you feel challenged. Ultimately, you may be asked to do this exercise with your right / left foot only. STRENGTH  Plantar-flexors, Eccentric  Note: This exercise can place a lot of stress on your foot and ankle. Please complete this exercise only if specifically instructed by your caregiver.   Place the balls of your feet on a step. With your hands, use only enough support from a wall or rail to keep your balance.  Keep your knees straight and rise up on your toes.  Slowly shift your weight entirely to your right / left toes and pick up your opposite foot. Gently and with controlled movement, lower your weight  through your right / left foot so that your heel drops below the level of the step. You will feel a slight stretch in the back of your calf at the end position.  Use the healthy leg to help rise up onto the balls of both feet, then lower weight only on the right / left leg  again. Build up to 15 repetitions. Then progress to 3 consecutive sets of 15 repetitions.*  After completing the above exercise, complete the same exercise with a slight knee bend (about 30 degrees). Again, build up to 15 repetitions. Then progress to 3 consecutive sets of 15 repetitions.* Perform this exercise __________ times per day.  *When you easily complete 3 sets of 15, your physician, physical therapist or athletic trainer may advise you to add resistance by wearing a backpack filled with additional weight. STRENGTH - Plantar Flexors, Seated   Sit on a chair that allows your feet to rest flat on the ground. If necessary, sit at the edge of the chair.  Keeping your toes firmly on the ground, lift your right / left heel as far as you can without increasing any discomfort in your ankle. Repeat __________ times. Complete this exercise __________ times a day. *If instructed by your physician, physical therapist or athletic trainer, you may add ____________________ of resistance by placing a weighted object on your right / left knee. Document Released: 09/29/2004 Document Revised: 05/23/2011 Document Reviewed: 06/12/2008 [facility] Patient Information 2014 [place], [place].

## 2022-12-06 ENCOUNTER — Other Ambulatory Visit: Payer: Self-pay

## 2022-12-06 ENCOUNTER — Other Ambulatory Visit (INDEPENDENT_AMBULATORY_CARE_PROVIDER_SITE_OTHER): Payer: Self-pay | Admitting: Primary Care

## 2022-12-06 DIAGNOSIS — I10 Essential (primary) hypertension: Secondary | ICD-10-CM

## 2022-12-06 NOTE — Telephone Encounter (Signed)
Requested Prescriptions  Refused Prescriptions Disp Refills   hydrochlorothiazide (HYDRODIURIL) 25 MG tablet 90 tablet 1    Sig: TAKE 1 TABLET (25 MG TOTAL) BY MOUTH DAILY.     Cardiovascular: Diuretics - Thiazide Passed - 12/06/2022  8:40 AM      Passed - Cr in normal range and within 180 days    Creatinine, Ser  Date Value Ref Range Status  11/16/2022 0.61 0.44 - 1.00 mg/dL Final         Passed - K in normal range and within 180 days    Potassium  Date Value Ref Range Status  11/16/2022 4.1 3.5 - 5.1 mmol/L Final         Passed - Na in normal range and within 180 days    Sodium  Date Value Ref Range Status  11/16/2022 136 135 - 145 mmol/L Final  04/04/2019 143 134 - 144 mmol/L Final         Passed - Last BP in normal range    BP Readings from Last 1 Encounters:  11/22/22 118/76         Passed - Valid encounter within last 6 months    Recent Outpatient Visits           2 weeks ago Tendinitis of both feet   [place] [facility] Grayce Sessions, NP   1 month ago Medication refill   [place] [facility] Grayce Sessions, NP   2 months ago Hyperglycemia   [place] [facility] Grayce Sessions, NP   1 year ago Current moderate episode of major depressive disorder, unspecified whether recurrent (HCC)   [place] [facility] Grayce Sessions, NP   2 years ago Other acute sinusitis, recurrence not specified   [place] [facility] Grayce Sessions, NP       Future Appointments             In 2 months Randa Evens, Kinnie Scales, NP [place] [facility]

## 2022-12-09 ENCOUNTER — Other Ambulatory Visit: Payer: Self-pay

## 2022-12-22 ENCOUNTER — Other Ambulatory Visit: Payer: Self-pay

## 2022-12-22 ENCOUNTER — Telehealth: Payer: Self-pay | Admitting: Podiatry

## 2022-12-22 ENCOUNTER — Other Ambulatory Visit: Payer: Self-pay | Admitting: Podiatry

## 2022-12-22 MED ORDER — MELOXICAM 15 MG PO TABS
15.0000 mg | ORAL_TABLET | Freq: Every day | ORAL | 0 refills | Status: DC
  Filled 2022-12-22 – 2022-12-30 (×2): qty 30, 30d supply, fill #0

## 2022-12-22 NOTE — Telephone Encounter (Signed)
Notified pt medication was sent in and she said thank you

## 2022-12-22 NOTE — Telephone Encounter (Signed)
Pt called needing rx refill on her meloxicam please

## 2022-12-23 ENCOUNTER — Other Ambulatory Visit: Payer: Self-pay

## 2022-12-29 ENCOUNTER — Other Ambulatory Visit: Payer: Self-pay

## 2022-12-29 ENCOUNTER — Ambulatory Visit (INDEPENDENT_AMBULATORY_CARE_PROVIDER_SITE_OTHER): Payer: 59 | Admitting: Podiatry

## 2022-12-29 ENCOUNTER — Encounter: Payer: Self-pay | Admitting: Podiatry

## 2022-12-29 DIAGNOSIS — M76821 Posterior tibial tendinitis, right leg: Secondary | ICD-10-CM | POA: Diagnosis not present

## 2022-12-29 DIAGNOSIS — M792 Neuralgia and neuritis, unspecified: Secondary | ICD-10-CM

## 2022-12-29 DIAGNOSIS — E1142 Type 2 diabetes mellitus with diabetic polyneuropathy: Secondary | ICD-10-CM | POA: Diagnosis not present

## 2022-12-29 DIAGNOSIS — M76822 Posterior tibial tendinitis, left leg: Secondary | ICD-10-CM | POA: Diagnosis not present

## 2022-12-29 MED ORDER — GABAPENTIN 300 MG PO CAPS
300.0000 mg | ORAL_CAPSULE | Freq: Three times a day (TID) | ORAL | 3 refills | Status: DC
  Filled 2022-12-29: qty 90, 30d supply, fill #0
  Filled 2023-02-16: qty 90, 30d supply, fill #1
  Filled 2023-03-27: qty 90, 30d supply, fill #2
  Filled 2023-05-11: qty 90, 30d supply, fill #3

## 2022-12-29 NOTE — Progress Notes (Signed)
Subjective:  Patient ID: Gina Shelton, female    DOB: Mar 04, 1976,  MRN: [id number]  Chief Complaint  Patient presents with   Tendinitis    F/up b/l achilles tendinitis. Thinks she is doing worse at this point. Presents in crocs, wears slip resistant shoes at work. States she felt a pop along right anterior ankle when walking a couple weeks ago and now has pain to anterior ankle and medial ankle in addition to achilles pain    47 y.o. female presents for follow-up on Achilles tendinitis as well as pain along the inside of the rear foot.  Has pain in the anterior ankle and medial ankle in addition to mild Achilles pain.  Does notice burning tingling pins and needle sensation as well.  Has been taking gabapentin 300 mg 3 times daily.  Also tried meloxicam and steroid pack thinks the steroid pack helped but does not think the meloxicam is doing much.  Past Medical History:  Diagnosis Date   Anxiety    COPD (chronic obstructive pulmonary disease) (HCC)    Depression    Depression    Diabetes mellitus without complication (HCC)    GERD (gastroesophageal reflux disease)    Gout    ble   Hypertension    Migraine    Migraine     No Known Allergies  ROS: Negative except as per HPI above  Objective:  General: AAO x3, NAD  Dermatological: With inspection and palpation of the right and left lower extremities there are no open sores, no preulcerative lesions, no rash or signs of infection present. Nails are of normal length thickness and coloration.   Vascular:  Dorsalis Pedis artery and Posterior Tibial artery pedal pulses are 2/4 bilateral.  Capillary fill time < 3 sec to all digits.   Neruologic: Grossly intact via light touch bilateral. Protective threshold diminished  Musculoskeletal: Decreased motion bilateral ankle pain increased with dorsiflexion range of motion.   Pain with palpation along the course of the posterior tibial tendon inferior to the medial malleolus bilateral foot.    Gait: Unassisted, Nonantalgic.   No images are attached to the encounter.  Radiographs:  Deferred Assessment:   1. 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 Neuropathic pain   3. DM type 2 with diabetic peripheral neuropathy (HCC)       Plan:  Patient was evaluated and treated and all questions answered.  # Posterior tibial tendinitis bilateral lower extremity -Recommend steroid injection bilateral posterior tibial tendon inferior to the medial malleolus -After sterile prep injected 1 cc half some Marcaine plain with 1 cc of Kenalog 10 along the course of the posterior tibial tendon just inferior to the medial malleolus on the bilateral medial rear foot. -Recommend anti-inflammatory modalities such as icing rest and compression. -Dispensed 1 cam boot and 1 Tri-Lock brace for the patient right ankle.  Wear the Tri-Lock brace while at work and wear the cam boot while at home or out walking about -Recommend custom-made inserts for the patient -Referral to physical therapy placed at this visit once weekly for 6 weeks at least right ankle and left ankle strengthening anti-inflammatory modalities along the medial tendons   # Achilles tendinitis and generalized myopathy of bilateral lower extremity -Believe the patient's pain is more so related to posterior tibial tendinitis at this time as above  # Neuropathy of bilateral lower extremity -Continue gabapentin 300 mg 3 times daily patient denies side effects  Return in about 6 weeks (around 02/09/2023)  for f/u PTTD bilateral.          Corinna Gab, DPM [facility] / [facility]

## 2022-12-30 ENCOUNTER — Other Ambulatory Visit: Payer: Self-pay

## 2023-01-02 ENCOUNTER — Other Ambulatory Visit: Payer: Self-pay

## 2023-01-12 ENCOUNTER — Other Ambulatory Visit (INDEPENDENT_AMBULATORY_CARE_PROVIDER_SITE_OTHER): Payer: Self-pay | Admitting: Primary Care

## 2023-01-12 ENCOUNTER — Other Ambulatory Visit: Payer: Self-pay

## 2023-01-16 ENCOUNTER — Other Ambulatory Visit: Payer: Self-pay

## 2023-01-26 ENCOUNTER — Other Ambulatory Visit (INDEPENDENT_AMBULATORY_CARE_PROVIDER_SITE_OTHER): Payer: Self-pay | Admitting: Primary Care

## 2023-01-26 ENCOUNTER — Other Ambulatory Visit: Payer: Self-pay

## 2023-01-26 DIAGNOSIS — I10 Essential (primary) hypertension: Secondary | ICD-10-CM

## 2023-01-26 NOTE — Telephone Encounter (Signed)
Will forward to provider  

## 2023-01-29 MED ORDER — HYDROCHLOROTHIAZIDE 25 MG PO TABS
ORAL_TABLET | Freq: Every day | ORAL | 1 refills | Status: DC
Start: 2023-01-29 — End: 2023-10-18
  Filled 2023-01-29: qty 30, 30d supply, fill #0
  Filled 2023-03-27: qty 30, 30d supply, fill #1
  Filled 2023-05-11: qty 30, 30d supply, fill #2
  Filled 2023-06-09: qty 30, 30d supply, fill #3
  Filled 2023-07-14: qty 30, 30d supply, fill #4
  Filled 2023-08-31: qty 30, 30d supply, fill #5

## 2023-01-29 MED ORDER — ALBUTEROL SULFATE HFA 108 (90 BASE) MCG/ACT IN AERS
2.0000 | INHALATION_SPRAY | RESPIRATORY_TRACT | 1 refills | Status: DC | PRN
  Filled 2023-01-29: qty 6.7, 17d supply, fill #0

## 2023-01-30 ENCOUNTER — Other Ambulatory Visit: Payer: Self-pay

## 2023-02-03 ENCOUNTER — Ambulatory Visit (INDEPENDENT_AMBULATORY_CARE_PROVIDER_SITE_OTHER): Payer: 59 | Admitting: Podiatry

## 2023-02-03 ENCOUNTER — Other Ambulatory Visit: Payer: Self-pay

## 2023-02-03 ENCOUNTER — Encounter: Payer: Self-pay | Admitting: Podiatry

## 2023-02-03 VITALS — Ht 65.0 in | Wt 247.0 lb

## 2023-02-03 DIAGNOSIS — M76822 Posterior tibial tendinitis, left leg: Secondary | ICD-10-CM | POA: Diagnosis not present

## 2023-02-03 DIAGNOSIS — M792 Neuralgia and neuritis, unspecified: Secondary | ICD-10-CM

## 2023-02-03 DIAGNOSIS — M76821 Posterior tibial tendinitis, right leg: Secondary | ICD-10-CM

## 2023-02-03 NOTE — Progress Notes (Signed)
Subjective:  Patient ID: Gina Shelton, female    DOB: 1975/12/11,  MRN: [id number]  Chief Complaint  Patient presents with   Foot Pain    F/up b/l achilles tendinitis. Patient is in severe pain.    47 y.o. female presents for follow-up on Achilles tendinitis as well as pain along the inside of the rear foot.  Patient having severe pain today.  Having difficulty walking and with any pressure along the inside of her ankles.  Pain is worse with standing and walking.  She says the steroid shot given at last appointment did help for about 2 weeks and she is requesting another today.  Has been using a cam boot occasionally especially at work which does help slightly as well.  Past Medical History:  Diagnosis Date   Anxiety    COPD (chronic obstructive pulmonary disease) (HCC)    Depression    Depression    Diabetes mellitus without complication (HCC)    GERD (gastroesophageal reflux disease)    Gout    ble   Hypertension    Migraine    Migraine     No Known Allergies  ROS: Negative except as per HPI above  Objective:  General: AAO x3, NAD  Dermatological: With inspection and palpation of the right and left lower extremities there are no open sores, no preulcerative lesions, no rash or signs of infection present. Nails are of normal length thickness and coloration.   Vascular:  Dorsalis Pedis artery and Posterior Tibial artery pedal pulses are 2/4 bilateral.  Capillary fill time < 3 sec to all digits.   Neruologic: Grossly intact via light touch bilateral. Protective threshold diminished  Musculoskeletal: Decreased motion bilateral ankle pain increased with dorsiflexion range of motion.   Pain with palpation along the course of the posterior tibial tendon inferior to the medial malleolus bilateral foot.  Severe pain with palpation in the area.  Gait: Unassisted, Nonantalgic.   No images are attached to the encounter.  Radiographs:  Deferred Assessment:   1. Posterior  tibial tendinitis of both lower extremities   2. Neuropathic pain        Plan:  Patient was evaluated and treated and all questions answered.  # Posterior tibial tendinitis bilateral lower extremity -Patient not significantly improved though did have some relief with the steroid shot and wants another today -After sterile prep injected 1 cc half some Marcaine plain with 1 cc of Kenalog 10 along the course of the posterior tibial tendon just inferior to the medial malleolus on the bilateral medial rear foot. -Recommend anti-inflammatory modalities such as icing rest and compression. -Continue cam boot and Tri-Lock brace as needed -Patient is starting physical therapy next week -Will order MRI bilateral ankle due to severity of pain and chronic nature of pain which has failed conservative therapies including anti-inflammatory medications such as steroid Dosepaks and meloxicam as well as multiple steroid injections and immobilization with Cam boot and Tri-Lock ankle brace.  # Achilles tendinitis and generalized myopathy of bilateral lower extremity -Believe the patient's pain is more so related to posterior tibial tendinitis at this time as above  # Neuropathy of bilateral lower extremity -Continue gabapentin 300 mg 3 times daily patient denies side effects  No follow-ups on file.          Corinna Gab, DPM [facility] / [facility]

## 2023-02-06 ENCOUNTER — Ambulatory Visit: Payer: 59 | Attending: Podiatry | Admitting: Physical Therapy

## 2023-02-06 ENCOUNTER — Encounter: Payer: Self-pay | Admitting: Physical Therapy

## 2023-02-06 DIAGNOSIS — M25571 Pain in right ankle and joints of right foot: Secondary | ICD-10-CM | POA: Insufficient documentation

## 2023-02-06 DIAGNOSIS — M76821 Posterior tibial tendinitis, right leg: Secondary | ICD-10-CM | POA: Insufficient documentation

## 2023-02-06 DIAGNOSIS — M76822 Posterior tibial tendinitis, left leg: Secondary | ICD-10-CM | POA: Insufficient documentation

## 2023-02-06 DIAGNOSIS — M25572 Pain in left ankle and joints of left foot: Secondary | ICD-10-CM | POA: Insufficient documentation

## 2023-02-06 NOTE — Therapy (Signed)
[facility] [address] [place], [place], [address] Phone: [phone]   Fax:  [phone]  Patient Details  Name: Gina Shelton MRN: [id number] Date of Birth: 1975-06-17 Referring Provider:  Pilar Plate*  Encounter Date: 02/06/2023 Pt arrived to PT eval ambulating with no device and not wearing Cam boot; pt reported she received cortisone injections on Friday, 02-03-23 and is feeling better today - rates pain 7/10 at current time.  Pt reports MRI was ordered on 02-03-23 and is scheduled on 02-26-23.  Pt is also scheduled to see Dr. Lucia Gaskins on 02-24-23.  PT evaluation was withheld pending MRI - will reschedule eval pending MRI results.  Pt in agreement with this plan.    Kary Kos, PT 02/06/2023, 9:11 AM  [place] [facility] [address] [place], [place], [address] Phone: [phone]   Fax:  [phone]

## 2023-02-13 ENCOUNTER — Telehealth: Payer: Self-pay

## 2023-02-13 NOTE — Telephone Encounter (Signed)
Left ankle MRI was denied du to there being no results of an xray being done

## 2023-02-16 ENCOUNTER — Other Ambulatory Visit: Payer: Self-pay

## 2023-02-16 ENCOUNTER — Ambulatory Visit: Payer: 59 | Admitting: Podiatry

## 2023-02-21 ENCOUNTER — Other Ambulatory Visit: Payer: Self-pay

## 2023-02-21 ENCOUNTER — Encounter: Payer: Self-pay | Admitting: Podiatry

## 2023-02-21 ENCOUNTER — Ambulatory Visit (INDEPENDENT_AMBULATORY_CARE_PROVIDER_SITE_OTHER): Payer: 59 | Admitting: Primary Care

## 2023-02-21 ENCOUNTER — Encounter (INDEPENDENT_AMBULATORY_CARE_PROVIDER_SITE_OTHER): Payer: Self-pay | Admitting: Primary Care

## 2023-02-21 VITALS — BP 134/95 | HR 90 | Resp 16 | Wt 234.2 lb

## 2023-02-21 DIAGNOSIS — I10 Essential (primary) hypertension: Secondary | ICD-10-CM | POA: Diagnosis not present

## 2023-02-21 DIAGNOSIS — M778 Other enthesopathies, not elsewhere classified: Secondary | ICD-10-CM

## 2023-02-21 DIAGNOSIS — G4709 Other insomnia: Secondary | ICD-10-CM

## 2023-02-21 DIAGNOSIS — F331 Major depressive disorder, recurrent, moderate: Secondary | ICD-10-CM

## 2023-02-21 DIAGNOSIS — M25572 Pain in left ankle and joints of left foot: Secondary | ICD-10-CM | POA: Diagnosis not present

## 2023-02-21 DIAGNOSIS — G8929 Other chronic pain: Secondary | ICD-10-CM

## 2023-02-21 DIAGNOSIS — Z760 Encounter for issue of repeat prescription: Secondary | ICD-10-CM | POA: Diagnosis not present

## 2023-02-21 DIAGNOSIS — Z1211 Encounter for screening for malignant neoplasm of colon: Secondary | ICD-10-CM

## 2023-02-21 MED ORDER — SERTRALINE HCL 50 MG PO TABS
150.0000 mg | ORAL_TABLET | Freq: Every morning | ORAL | 2 refills | Status: DC
  Filled 2023-02-21 – 2023-06-09 (×2): qty 90, 30d supply, fill #0
  Filled 2023-07-14: qty 90, 30d supply, fill #1
  Filled 2023-08-31: qty 90, 30d supply, fill #2
  Filled 2023-10-18: qty 90, 30d supply, fill #3

## 2023-02-21 MED ORDER — PRAZOSIN HCL 1 MG PO CAPS
3.0000 mg | ORAL_CAPSULE | Freq: Every day | ORAL | 3 refills | Status: DC
  Filled 2023-02-21: qty 90, 30d supply, fill #0

## 2023-02-21 NOTE — Progress Notes (Signed)
[facility]  Gina Shelton, is a 47 y.o. female  UYQ:[id number]  GLO:[id number]  DOB - [birth year]  Chief Complaint  Patient presents with   Hypertension   Prediabetes       Subjective:   Gina Shelton is a 47 y.o. female here today for a follow up visit. Patient has No headache, No chest pain, No abdominal pain - No Nausea, No new weakness tingling or numbness, No Cough - shortness of breath Hypertension This is a chronic problem. The current episode started more than 1 year ago. The problem is unchanged. The problem is controlled. Associated symptoms include anxiety. There are no associated agents to hypertension. Risk factors for coronary artery disease include dyslipidemia and obesity. Past treatments include diuretics. The current treatment provides moderate improvement. Compliance problems include exercise.   Diastolic elevated -feels like the pain in her feet causing    No problems updated.  Comprehensive ROS Pertinent positive and negative noted in HPI   No Known Allergies  Past Medical History:  Diagnosis Date   Anxiety    COPD (chronic obstructive pulmonary disease) (HCC)    Depression    Depression    Diabetes mellitus without complication (HCC)    GERD (gastroesophageal reflux disease)    Gout    ble   Hypertension    Migraine    Migraine     Current Outpatient Medications on File Prior to Visit  Medication Sig Dispense Refill   albuterol (VENTOLIN HFA) 108 (90 Base) MCG/ACT inhaler Inhale 2 puffs into the lungs every 4 (four) hours as needed for wheezing or shortness of breath. 6.7 g 1   Blood Pressure Monitor KIT 1 kit by Does not apply route 3 (three) times daily as needed. 1 kit 0   cholecalciferol (VITAMIN D3) 25 MCG (1000 UNIT) tablet Take 1 tablet (1,000 Units total) by mouth every morning. 90 tablet 1   colchicine 0.6 MG tablet Take 1 tablet (0.6 mg total) by mouth daily as needed (gout pain). 90 tablet 1   esomeprazole (NEXIUM) 40  MG capsule Take 1 capsule (40 mg total) by mouth daily. 90 capsule 1   fluticasone (FLONASE) 50 MCG/ACT nasal spray Place 2 sprays into both nostrils daily. 16 g 3   gabapentin (NEURONTIN) 300 MG capsule Take 1 capsule (300 mg total) by mouth 2 (two) times daily. 60 capsule 2   gabapentin (NEURONTIN) 300 MG capsule Take 1 capsule (300 mg total) by mouth 3 (three) times daily. 90 capsule 3   hydrochlorothiazide (HYDRODIURIL) 25 MG tablet TAKE 1 TABLET (25 MG TOTAL) BY MOUTH DAILY. 90 tablet 1   levocetirizine (XYZAL ALLERGY 24HR) 5 MG tablet Take 1 tablet (5 mg total) by mouth every evening. 90 tablet 1   metFORMIN (GLUCOPHAGE) 500 MG tablet Take 0.5 tablets (250 mg total) by mouth 2 (two) times daily. 90 tablet 1   methylPREDNISolone (MEDROL DOSEPAK) 4 MG TBPK tablet Take as directed for 6 days 21 each 0   No current facility-administered medications on file prior to visit.   Health Maintenance  Topic Date Due   Yearly kidney health urinalysis for diabetes  Never done   Pap with HPV screening  06/21/2022   Colon Cancer Screening  Never done   COVID-19 Vaccine (1 - 2023-24 season) Never done   Flu Shot  06/12/2023*   Yearly kidney function blood test for diabetes  11/16/2023   DTaP/Tdap/Td vaccine (4 - Td or Tdap) 07/30/2025   Hepatitis C Screening  Completed  HIV Screening  Completed   HPV Vaccine  Aged Out  *Topic was postponed. The date shown is not the original due date.    Objective:  BP (!) 134/95   Pulse 90   Resp 16   Wt 234 lb 3.2 oz (106.2 kg)   SpO2 100%   BMI 38.97 kg/m    Physical Exam Vitals reviewed.  Constitutional:      Appearance: She is obese.  HENT:     Head: Normocephalic.     Right Ear: Tympanic membrane, ear canal and external ear normal.     Left Ear: Tympanic membrane, ear canal and external ear normal.     Nose: Nose normal.     Mouth/Throat:     Mouth: Mucous membranes are moist.  Eyes:     Extraocular Movements: Extraocular movements intact.      Pupils: Pupils are equal, round, and reactive to light.  Cardiovascular:     Rate and Rhythm: Normal rate.  Pulmonary:     Effort: Pulmonary effort is normal.     Breath sounds: Normal breath sounds.  Abdominal:     General: Bowel sounds are normal. There is distension.     Palpations: Abdomen is soft.  Musculoskeletal:        General: Normal range of motion.     Cervical back: Normal range of motion.  Skin:    General: Skin is warm and dry.  Neurological:     Mental Status: She is alert and oriented to person, place, and time.  Psychiatric:        Mood and Affect: Mood normal.    Assessment & Plan   Gina Shelton was seen today for hypertension and prediabetes.  Diagnoses and all orders for this visit:  Colon cancer screening -     Ambulatory referral to Gastroenterology  Essential hypertension  Medication refill  Moderate episode of recurrent major depressive disorder (HCC)      sertraline (ZOLOFT) 50 MG tablet; Take 3 tablets (150 mg total) by mouth every morning.    prazosin (MINIPRESS) 1 MG capsule; Take 3 capsules (3 mg total) by mouth at bedtime. 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    prazosin (MINIPRESS) 1 MG capsule; Take 3 capsules (3 mg total) by mouth at bedtime.     Patient have been counseled extensively about nutrition and exercise. Other issues discussed during this visit include: low cholesterol diet, weight control and daily exercise, foot care, annual eye examinations at Ophthalmology, importance of adherence with medications and regular follow-up. We also discussed long term complications of uncontrolled diabetes and hypertension.   Return in about 3 months (around 05/22/2023) for medical conditions.  The patient was given clear  instructions to go to ER or return to medical center if symptoms don't improve, worsen or new problems develop. The patient verbalized understanding. The patient was told to call to get lab results if they haven't heard anything in the next week.   This note has been created with Education officer, environmental. Any transcriptional errors are unintentional.   Grayce Sessions, NP 02/21/2023, 3:56 PM

## 2023-02-22 ENCOUNTER — Other Ambulatory Visit: Payer: Self-pay

## 2023-02-22 ENCOUNTER — Encounter: Payer: Self-pay | Admitting: Podiatry

## 2023-02-23 ENCOUNTER — Encounter: Payer: Self-pay | Admitting: Podiatry

## 2023-02-24 ENCOUNTER — Encounter: Payer: Self-pay | Admitting: Neurology

## 2023-02-24 ENCOUNTER — Telehealth: Payer: Self-pay

## 2023-02-24 ENCOUNTER — Other Ambulatory Visit: Payer: Self-pay | Admitting: Podiatry

## 2023-02-24 ENCOUNTER — Ambulatory Visit: Payer: 59 | Admitting: Neurology

## 2023-02-24 VITALS — BP 118/79 | HR 93 | Ht 62.0 in | Wt 233.4 lb

## 2023-02-24 DIAGNOSIS — M79671 Pain in right foot: Secondary | ICD-10-CM

## 2023-02-24 DIAGNOSIS — E538 Deficiency of other specified B group vitamins: Secondary | ICD-10-CM | POA: Diagnosis not present

## 2023-02-24 DIAGNOSIS — G629 Polyneuropathy, unspecified: Secondary | ICD-10-CM | POA: Diagnosis not present

## 2023-02-24 DIAGNOSIS — D8989 Other specified disorders involving the immune mechanism, not elsewhere classified: Secondary | ICD-10-CM | POA: Diagnosis not present

## 2023-02-24 DIAGNOSIS — M79672 Pain in left foot: Secondary | ICD-10-CM

## 2023-02-24 DIAGNOSIS — M67979 Unspecified disorder of synovium and tendon, unspecified ankle and foot: Secondary | ICD-10-CM

## 2023-02-24 DIAGNOSIS — R7303 Prediabetes: Secondary | ICD-10-CM

## 2023-02-24 DIAGNOSIS — R519 Headache, unspecified: Secondary | ICD-10-CM

## 2023-02-24 DIAGNOSIS — E66813 Obesity, class 3: Secondary | ICD-10-CM

## 2023-02-24 DIAGNOSIS — E519 Thiamine deficiency, unspecified: Secondary | ICD-10-CM

## 2023-02-24 DIAGNOSIS — R5383 Other fatigue: Secondary | ICD-10-CM

## 2023-02-24 DIAGNOSIS — G4719 Other hypersomnia: Secondary | ICD-10-CM

## 2023-02-24 MED ORDER — CYANOCOBALAMIN 1000 MCG/ML IJ SOLN
1000.0000 ug | Freq: Once | INTRAMUSCULAR | Status: AC
  Administered 2023-02-24: 1000 ug via INTRAMUSCULAR

## 2023-02-24 NOTE — Patient Instructions (Addendum)
Blood work Send a referral to Sleep team Pending blood work may start injections of B12 or other vitamin supplementation Also testing for many cause of neuropathy but I believe your pain in the feet are due to achilles tendonitis May consider emg/ncs in the future if worsens and she can contact us to order  -May consider daily 600mg  alpha lipoic acid which is an antioxidant that may reduce free radical oxidative stress associated with diabetic polyneuropathy, existing evidence suggests that alpha lipoic acid significantly reduces stabbing, lancinating and burning pain and diabetic neuropathy with its onset of action as early as 1-2 weeks.   Sleep Apnea  Sleep apnea is a condition that affects your breathing while you are sleeping. Your tongue or soft tissue in your throat may block the flow of air while you sleep. You may have shallow breathing or stop breathing for short periods of time. People with sleep apnea may snore loudly. There are three kinds of sleep apnea: Obstructive sleep apnea. This kind is caused by a blocked or collapsed airway. This is the most common. Central sleep apnea. This kind happens when the part of the brain that controls breathing does not send the correct signals to the muscles that control breathing. Mixed sleep apnea. This is a combination of obstructive and central sleep apnea. What are the causes? The most common cause of sleep apnea is a collapsed or blocked airway. What increases the risk? Being very overweight. Having family members with sleep apnea. Having a tongue or tonsils that are larger than normal. Having a small airway or jaw problems. Being older. What are the signs or symptoms? Loud snoring. Restless sleep. Trouble staying asleep. Being sleepy or tired during the day. Waking up gasping or choking. Having a headache in the morning. Mood swings. Having a hard time remembering things and concentrating. How is this diagnosed? A medical  history. A physical exam. A sleep study. This is also called a polysomnography test. This test is done at a sleep lab or in your home while you are sleeping. How is this treated? Treatment may include: Sleeping on your side. Losing weight if you're overweight. Wearing an oral appliance. This is a mouthpiece that moves your lower jaw forward. Using a positive airway pressure (PAP) device to keep your airways open while you sleep, such as: A continuous positive airway pressure (CPAP) device. This device gives forced air through a mask when you breathe out. This keeps your airways open. A bilevel positive airway pressure (BIPAP) device. This device gives forced air through a mask when you breathe in and when you breathe out to keep your airways open. Having surgery if other treatments do not work. If your sleep apnea is not treated, you may be at risk for: Heart failure. Heart attack. Stroke. Type 2 diabetes or a problem with your blood sugar called insulin resistance. Follow these instructions at home: Medicines Take your medicines only as told by your health care provider. Avoid alcohol, medicines to help you relax, and certain pain medicines. These may make sleep apnea worse. General instructions Do not smoke, vape, or use products with nicotine or tobacco in them. If you need help quitting, talk with your provider. If you were given a PAP device to open your airway while you sleep, use it as told by your provider. If you're having surgery, make sure to tell your provider you have sleep apnea. You may need to bring your PAP device with you. Contact a health care provider if:  The PAP device that you were given to use during sleep bothers you or does not seem to be working. You do not feel better or you feel worse. Get help right away if: You have trouble breathing. You have chest pain. You have trouble talking. One side of your body feels weak. A part of your face is hanging  down. These symptoms may be an emergency. Call 911 right away. Do not wait to see if the symptoms will go away. Do not drive yourself to the hospital. This information is not intended to replace advice given to you by your health care provider. Make sure you discuss any questions you have with your health care provider. Document Revised: 05/05/2022 Document Reviewed: 05/05/2022 Elsevier Patient Education  2024 [employer]   Peripheral Neuropathy Peripheral neuropathy is a type of nerve damage. It affects nerves that carry signals between the spinal cord and the arms, legs, and the rest of the body (peripheral nerves). It does not affect nerves in the spinal cord or brain. In peripheral neuropathy, one nerve or a group of nerves may be damaged. Peripheral neuropathy is a broad category that includes many specific nerve disorders, like diabetic neuropathy, hereditary neuropathy, and carpal tunnel syndrome. What are the causes? This condition may be caused by: Certain diseases, such as: Diabetes. This is the most common cause of peripheral neuropathy. Autoimmune diseases, such as rheumatoid arthritis and systemic lupus erythematosus. Nerve diseases that are passed from parent to child (inherited). Kidney disease. Thyroid disease. Other causes may include: Nerve injury. Pressure or stress on a nerve that lasts a long time. Lack (deficiency) of B vitamins. This can result from alcoholism, poor diet, or a restricted diet. Infections. Some medicines, such as cancer medicines (chemotherapy). Poisonous (toxic) substances, such as lead and mercury. Too little blood flowing to the legs. Alcohol and substance abuse Obesity In some cases, the cause of this condition is not known. What are the signs or symptoms? Symptoms of this condition depend on which of your nerves is damaged. Symptoms in the legs, hands, and arms can include: Loss of feeling (numbness) in the feet, hands, or  both. Tingling in the feet, hands, or both. Burning pain. Very sensitive skin. Weakness. Not being able to move a part of the body (paralysis). Clumsiness or poor coordination. Muscle twitching. Loss of balance. Symptoms in other parts of the body can include: Not being able to control your bladder. Feeling dizzy. Sexual problems. How is this diagnosed? Diagnosing and finding the cause of peripheral neuropathy can be difficult. Your health care provider will take your medical history and do a physical exam. A neurological exam will also be done. This involves checking things that are affected by your brain, spinal cord, and nerves (nervous system). For example, your health care provider will check your reflexes, how you move, and what you can feel. You may have other tests, such as: Blood tests. Electromyogram (EMG) and nerve conduction tests. These tests check nerve function and how well the nerves are controlling the muscles. Imaging tests, such as a CT scan or MRI, to rule out other causes of your symptoms. Removing a small piece of nerve to be examined in a lab (nerve biopsy). Removing and examining a small amount of the fluid that surrounds the brain and spinal cord (lumbar puncture). How is this treated? Treatment for this condition may involve: Treating the underlying cause of the neuropathy, such as diabetes, kidney disease, or vitamin deficiencies. Stopping medicines that can cause  neuropathy, such as chemotherapy. Medicine to help relieve pain. Medicines may include: Prescription or over-the-counter pain medicine. Anti-seizure medicine. Antidepressants. Pain-relieving patches that are applied to painful areas of skin. Surgery to relieve pressure on a nerve or to destroy a nerve that is causing pain. Physical therapy to help improve movement and balance. Devices to help you move around (assistive devices). Follow these instructions at home: Medicines Take over-the-counter  and prescription medicines only as told by your health care provider. Do not take any other medicines without first asking your health care provider. Ask your health care provider if the medicine prescribed to you requires you to avoid driving or using machinery. Lifestyle  Do not use any products that contain nicotine or tobacco. These products include cigarettes, chewing tobacco, and vaping devices, such as e-cigarettes. Smoking keeps blood from reaching damaged nerves. If you need help quitting, ask your health care provider. Avoid or limit alcohol. Too much alcohol can cause a vitamin B deficiency, and vitamin B is needed for healthy nerves. Eat a healthy diet. This includes: Eating foods that are high in fiber, such as beans, whole grains, and fresh fruits and vegetables. Limiting foods that are high in fat and processed sugars, such as fried or sweet foods. General instructions  If you have diabetes, work closely with your health care provider to keep your blood sugar under control. If you have numbness in your feet: Check every day for signs of injury or infection. Watch for redness, warmth, and swelling. Wear padded socks and comfortable shoes. These help protect your feet. Develop a good support system. Living with peripheral neuropathy can be stressful. Consider talking with a mental health specialist or joining a support group. Use assistive devices and attend physical therapy as told by your health care provider. This may include using a walker or a cane. Keep all follow-up visits. This is important. Where to find more information [employer] [facility] [employer]: [employer] Contact a health care provider if: You have new signs or symptoms of peripheral neuropathy. You are struggling emotionally from dealing with peripheral neuropathy. Your pain is not well controlled. Get help right away if: You have an injury or infection that is not healing normally. You  develop new weakness in an arm or leg. You have fallen or do so frequently. Summary Peripheral neuropathy is when the nerves in the arms or legs are damaged, resulting in numbness, weakness, or pain. There are many causes of peripheral neuropathy, including diabetes, pinched nerves, vitamin deficiencies, autoimmune disease, and hereditary conditions. Diagnosing and finding the cause of peripheral neuropathy can be difficult. Your health care provider will take your medical history, do a physical exam, and do tests, including blood tests and nerve function tests. Treatment involves treating the underlying cause of the neuropathy and taking medicines to help control pain. Physical therapy and assistive devices may also help. This information is not intended to replace advice given to you by your health care provider. Make sure you discuss any questions you have with your health care provider. Document Revised: 11/03/2020 Document Reviewed: 11/03/2020 Elsevier Patient Education  2024 [employer]   Consider this test in the future:  Electromyoneurogram Electromyoneurogram is a test to check how well your muscles and nerves are working. This procedure includes the combined use of electromyogram (EMG) and nerve conduction study (NCS). EMG is used to evaluate muscles and the nerves that control those muscles. NCS, which is also called electroneurogram, measures how well your nerves conduct electricity. The  procedures should be done together to check if your muscles and nerves are healthy. If the results of the tests are abnormal, this may indicate disease or injury, such as a neuromuscular disease or peripheral nerve damage. Tell a health care provider about: Any allergies you have. All medicines you are taking, including vitamins, herbs, eye drops, creams, and over-the-counter medicines. Any bleeding problems you have. Any surgeries you have had. Any medical conditions you have. What are the  risks? Generally, this is a safe procedure. However, problems may occur, including: Bleeding or bruising. Infection where the electrodes were inserted. What happens before the test? Medicines Take all of your usually prescribed medications before this testing is performed. Do not stop your blood thinners unless advised by your prescribing physician. General instructions Your health care provider may ask you to warm the limb that will be checked with warm water, hot pack, or wrapping the limb in a blanket. Do not use lotions or creams on the same day that you will be having the procedure. What happens during the test? For EMG  Your health care provider will ask you to stay in a position so that the muscle being studied can be accessed. You will be sitting or lying down. You may be given a medicine to numb the area (local anesthetic) and the skin will be disinfected. A very thin needle that has an electrode will be inserted into your muscle, one muscle at a time. Typically, multiple muscles are evaluated during a single study. Another small electrode will be placed on your skin near the muscle. Your health care provider will ask you to continue to remain still. The electrodes will record the electrical activity of your muscles. You may see this on a monitor or hear it in the room. After your muscles have been studied at rest, your health care provider will ask you to contract or flex your muscles. The electrodes will record the electrical activity of your muscles. Your health care provider will remove the electrodes and the electrode needle when the procedure is finished. The procedure may vary among health care providers and hospitals. For NCS  An electrode that records your nerve activity (recording electrode) will be placed on your skin by the muscle that is being studied. An electrode that is used as a reference (reference electrode) will be placed near the recording electrode. A paste or  gel will be applied to your skin between the recording electrode and the reference electrode. Your nerve will be stimulated with a mild shock. The speed of the nerves and strength of response is recorded by the electrodes. Your health care provider will remove the electrodes and the gel when the procedure is finished. The procedure may vary among health care providers and hospitals. What can I expect after the test? It is up to you to get your test results. Ask your health care provider, or the department that is doing the test, when your results will be ready. Your health care provider may: Give you medicines for any pain. Monitor the insertion sites to make sure that bleeding stops. You should be able to drive yourself to and from the test. Discomfort can persist for a few hours after the test, but should be better the next day. Contact a health care provider if: You have swelling, redness, or drainage at any of the insertion sites. Summary Electromyoneurogram is a test to check how well your muscles and nerves are working. If the results of the tests are  abnormal, this may indicate disease or injury. This is a safe procedure. However, problems may occur, such as bleeding and infection. Your health care provider will do two tests to complete this procedure. One checks your muscles (EMG) and another checks your nerves (NCS). It is up to you to get your test results. Ask your health care provider, or the department that is doing the test, when your results will be ready. This information is not intended to replace advice given to you by your health care provider. Make sure you discuss any questions you have with your health care provider. Document Revised: 11/11/2020 Document Reviewed: 10/11/2020 Elsevier Patient Education  2024 [employer].

## 2023-02-24 NOTE — Progress Notes (Unsigned)
Epworth Sleepiness Scale 0= would never doze 1= slight chance of dozing 2= moderate chance of dozing 3= high chance of dozing  Sitting and reading: 3 Watching TV: 3 Sitting inactive in a public place (ex. Theater or meeting): 2 As a passenger in a car for an hour without a break: 3 Lying down to rest in the afternoon: 3 Sitting and talking to someone: 0 Sitting quietly after lunch (no alcohol): 3 In a car, while stopped in traffic: 0 Total: 17  Per v.o. Dr Lucia Gaskins, pt was given B12 1000 mcg IM x 1 in R deltoid. Pt tolerated injection well. See MAR for documentation.

## 2023-02-24 NOTE — Telephone Encounter (Signed)
PA request for left ankle MRI was denied du to no results of left ankle xray.

## 2023-02-24 NOTE — Progress Notes (Unsigned)
[facility]    Provider:  Dr Lucia Gaskins Requesting Provider: Judithann Sheen, PA Primary Care Provider:  Grayce Sessions, NP  CC:  peripheral neuropathy  HPI:  Gina Shelton is a 47 y.o. female here as requested by Judithann Sheen, PA for peripheral neuropathy. has DEPRESSION, MAJOR, RECURRENT; ANXIETY STATE NOS; Alcohol dependence (HCC); Cocaine abuse (HCC); Major depressive disorder, single episode, severe without psychosis (HCC); Major depressive disorder, recurrent severe without psychotic features (HCC); Obesity (BMI 30.0-34.9); Ovarian cyst; Cavernous hemangioma of liver; Hypertensive urgency; Chest pain; Polysubstance abuse (HCC); COPD exacerbation (HCC); Chronic pain of left ankle; Left foot pain; Low back pain; Pain in both feet; Class 3 severe obesity due to excess calories with serious comorbidity and body mass index (BMI) of 40.0 to 44.9 in adult [facility]); and Achilles tendon disorder on their problem list.  Also polysubstance abuse, EtOH abuse, sciatica, COPD, hypertension, non-insulin-dependent diabetes presented to the emergency room with burning in her feet bilaterally that has been occurring over the past month, recently started working at [employer] after she was incarcerated for urine out of work this past year, she has also gained about 100 pounds over the past year a lot of work and lived a mostly sedentary lifestyle, at RV she reports that she is on her feet all day and requires frequent rest breaks due to pain in feet.  She was also seen in August 2020 for for similar pain to bilateral feet was given a prednisone burst without relief.  She was referred to neurology in September of this year by Leane Call, PA for foot pain bilaterally.  I reviewed these notes, patient recently released from prison, incarcerated for a year during which time she was diagnosed with diabetes, has had a progressive bilateral lower extremity pain in her feet which is a burning sensation  for which she has been treated as peripheral neuropathy, this cause is not exactly clear as her diabetes seems to be somewhat well-controlled based on her A1c, no other findings including swelling edema no asymmetry, rest of her exam is unremarkable, was asked to follow-up outpatient, patient reported bilateral foot pain that is burning tingling numbness in left foot swollen from ankle down to foot.  10 out of 10 on a pain scale.  Gabapentin and over-the-counter pain medications did not improve the pain or burning or other symptoms.  She followed up with primary care.  I reviewed notes on 11/22/2022 where she followed up with her primary care [facility]. [name] noted no swelling, she also saw [facility] in September.   Gained 100 pounds within this year. She has a hx of alcohol abuse. Incarcerated and found to have pre-diabetes. Drank a pint a day since she was 23 a pint a day been clean for over a year. Symptoms started in July and started working on her feet all day. The whole foot closer near the heel but toes will go to sleep when ending shift after being on feet. Saw podiatry and dxed with achilles tendonitis. Started acutely when being on her feet. Also the distribution near the heel is less likely for peripheral neuropathy. Gets better when off her feet but she still has burning and tingling in the heels and points to the plantar fascia on the side hear the heels. Neuropaty would not start acutely andappears to be more achilles tendonitis. No other focal neurologic deficits, associated symptoms, inciting events or modifiable factors.   Sleep: snoring, excessive  daytime fatigue, morning headaches, wakes with dry mouth, gained 100 pound, malampati 4, large neck, morbid obesity    F/up b/l achilles tendinitis. Patient is in severe pain.      47 y.o. female presents for follow-up on Achilles tendinitis as well as pain along the inside of the rear foot.   Patient having severe pain today.  Having difficulty walking and with any pressure along the inside of her ankles.  Pain is worse with standing and walking.  She says the steroid shot given at last appointment did help for about 2 weeks and she is requesting another today.  Has been using a cam boot occasionally especially at work which does help slightly as well.  Reviewed notes, labs and imaging from outside physicians, which showed: see above  Review of Systems: Patient complains of symptoms per HPI as well as the following symptoms weight gain, fatigue. Pertinent negatives and positives per HPI. All others negative.   Social History   Socioeconomic History   Marital status: Single    Spouse name: Not on file   Number of children: Not on file   Years of education: Not on file   Highest education level: Not on file  Occupational History   Not on file  Tobacco Use   Smoking status: Every Day    Current packs/day: 1.00    Types: Cigarettes   Smokeless tobacco: Never  Vaping Use   Vaping status: Never Used  Substance and Sexual Activity   Alcohol use: Not Currently    Comment: pt states no beer for 1 year and seven months   Drug use: No   Sexual activity: Yes  Other Topics Concern   Not on file  Social History Narrative   ** Merged History Encounter ** ** Merged History Encounter **       Lives with daughter    Pt works    Social Drivers of Corporate investment banker Strain: Not on file  Food Insecurity: Not on file  Transportation Needs: Not on file  Physical Activity: Not on file  Stress: Not on file  Social Connections: Not on file  Intimate Partner Violence: Not on file    Family History  Problem Relation Age of Onset   Hypertension Mother    Arthritis Sister    Anesthesia problems Neg Hx    Hypotension Neg Hx    Malignant hyperthermia Neg Hx    Pseudochol deficiency Neg Hx    Neuropathy Neg Hx     Past Medical History:  Diagnosis Date   Anxiety     COPD (chronic obstructive pulmonary disease) (HCC)    Depression    Depression    Diabetes mellitus without complication (HCC)    GERD (gastroesophageal reflux disease)    Gout    ble   Hypertension    Migraine    Migraine     Patient Active Problem List   Diagnosis Date Noted   Pain in both feet 02/26/2023   Class 3 severe obesity due to excess calories with serious comorbidity and body mass index (BMI) of 40.0 to 44.9 in adult (HCC) 02/26/2023   Achilles tendon disorder 02/26/2023   Low back pain 04/22/2021   Chronic pain of left ankle 10/21/2020   Left foot pain 10/21/2020   COPD exacerbation (HCC) 09/10/2019   Chest pain 09/27/2017   Polysubstance abuse (HCC) 09/27/2017   Hypertensive urgency 09/26/2017   Obesity (BMI 30.0-34.9) 05/22/2017   Ovarian cyst 05/22/2017  Cavernous hemangioma of liver 05/22/2017   Major depressive disorder, single episode, severe without psychosis (HCC) 12/31/2015   Major depressive disorder, recurrent severe without psychotic features (HCC) 12/31/2015   Alcohol dependence (HCC) 06/07/2013   Cocaine abuse (HCC) 06/07/2013   DEPRESSION, MAJOR, RECURRENT 08/17/2007   ANXIETY STATE NOS 11/15/2006    Past Surgical History:  Procedure Laterality Date   CESAREAN SECTION  infection at incission requiring return to OR x 2   TUBAL LIGATION      Current Outpatient Medications  Medication Sig Dispense Refill   Acetaminophen (TYLENOL PO) Take 500 mg by mouth as needed.     albuterol (VENTOLIN HFA) 108 (90 Base) MCG/ACT inhaler Inhale 2 puffs into the lungs every 4 (four) hours as needed for wheezing or shortness of breath. 6.7 g 1   Alpha-Lipoic Acid 600 MG CAPS Take 1 capsule (600 mg total) by mouth daily. Can increase to twice daily if no side effects     Blood Pressure Monitor KIT 1 kit by Does not apply route 3 (three) times daily as needed. 1 kit 0   cholecalciferol (VITAMIN D3) 25 MCG (1000 UNIT) tablet Take 1 tablet (1,000 Units total) by  mouth every morning. 90 tablet 1   colchicine 0.6 MG tablet Take 1 tablet (0.6 mg total) by mouth daily as needed (gout pain). 90 tablet 1   esomeprazole (NEXIUM) 40 MG capsule Take 1 capsule (40 mg total) by mouth daily. 90 capsule 1   fluticasone (FLONASE) 50 MCG/ACT nasal spray Place 2 sprays into both nostrils daily. 16 g 3   gabapentin (NEURONTIN) 300 MG capsule Take 1 capsule (300 mg total) by mouth 3 (three) times daily. 90 capsule 3   hydrochlorothiazide (HYDRODIURIL) 25 MG tablet TAKE 1 TABLET (25 MG TOTAL) BY MOUTH DAILY. 90 tablet 1   ibuprofen (ADVIL) 200 MG tablet Take 200 mg by mouth as needed.     levocetirizine (XYZAL ALLERGY 24HR) 5 MG tablet Take 1 tablet (5 mg total) by mouth every evening. 90 tablet 1   metFORMIN (GLUCOPHAGE) 500 MG tablet Take 0.5 tablets (250 mg total) by mouth 2 (two) times daily. 90 tablet 1   sertraline (ZOLOFT) 50 MG tablet Take 3 tablets (150 mg total) by mouth every morning. 180 tablet 2   gabapentin (NEURONTIN) 300 MG capsule Take 1 capsule (300 mg total) by mouth 2 (two) times daily. 60 capsule 2   No current facility-administered medications for this visit.    Allergies as of 02/24/2023   (No Known Allergies)    Vitals: BP 118/79 (BP Location: Right Arm, Patient Position: Sitting, Cuff Size: Normal)   Pulse 93   Ht 5\' 2"  (1.575 m)   Wt 233 lb 6.4 oz (105.9 kg)   BMI 42.69 kg/m  Last Weight:  Wt Readings from Last 1 Encounters:  02/24/23 233 lb 6.4 oz (105.9 kg)   Last Height:   Ht Readings from Last 1 Encounters:  02/24/23 5\' 2"  (1.575 m)     Physical exam: Exam: Gen: NAD, conversant, well nourised, obese, well groomed                     CV: RRR, no MRG. No Carotid Bruits. No peripheral edema, warm, nontender Eyes: Conjunctivae clear without exudates or hemorrhage  Neuro: Detailed Neurologic Exam  Speech:    Speech is normal; fluent and spontaneous with normal comprehension.  Cognition:    The patient is oriented to  person, place, and time;  recent and remote memory intact;     language fluent;     normal attention, concentration,     fund of knowledge Cranial Nerves:    The pupils are equal, round, and reactive to light. The fundi are normal and spontaneous venous pulsations are present. Visual fields are full to finger confrontation. Extraocular movements are intact. Trigeminal sensation is intact and the muscles of mastication are normal. The face is symmetric. The palate elevates in the midline. Hearing intact. Voice is normal. Shoulder shrug is normal. The tongue has normal motion without fasciculations.   Coordination:    Normal finger to nose and heel to shin. Normal rapid alternating movements.   Gait: **antalgic due to foot pain, slighlt wide based due to body habitus  Motor Observation:    No asymmetry, no atrophy, and no involuntary movements noted. Tone:    Normal muscle tone.    Posture:    Posture is normal. normal erect    Strength:    Strength is V/V in the upper and lower limbs.      Sensation: decreased distally in the foot to temp/cold vibration and proprioception intact     Reflex Exam:  DTR's:    Deep tendon reflexes in the upper and lower extremities are normal bilaterally.   Toes:    The toes are downgoing bilaterally.   Clonus:    Clonus is absent.    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  Sleep: snoring, excessive daytime fatigue, morning headaches, wakes with dry mouth, gained 100 pound, malampati  4, large neck, morbid obesity, ESS 17  B12 deficiency? was very low at 215 will recheck with MMA Full panel for other causes of neuropathy Anemia: follow up with primary care Get the hgbac as low as possible: -May consider daily alpha lipoic acid which is an antioxidant that may reduce free radical oxidative stress associated with diabetic polyneuropathy, existing evidence suggests that alpha lipoic acid significantly reduces stabbing, lancinating and burning pain and diabetic neuropathy with its onset of action as early as 1-2 weeks. 600mg  once daily Thorough evaluation for peripheral neuropathy likely has a small fiber neuropathy very mild but acute symptoms less likley due to neuropathy and more likely due to her diagnosed achilles tendonitis Weight loss : According to the [facility], obesity is a metabolic factor that can increase the risk of neuropathy, even in the absence of diabetes:  Stop smoking  Prevalence discussed Neuropathy is common in people with obesity, affecting 23% of people with obesity, even in those without diabetes.  Waist circumference Waist circumference is more closely associated with neuropathy than general obesity.  Early signs Subtle signs of neuropathy can be present in many obese people who don't have obvious neuropathy yet.  Other risk factors for neuropathy include: Diabetes, Anyone with diabetes or even pre-diabetes can develop neuropathy, and the risk increases with the length of time someone has diabetes.  Eat a healthy diet with lots of fruits, vegetables, whole grains, and lean protein.  Limit foods high in saturated fat.  Exercise regularly,  including at least 150 minutes of moderate to vigorous aerobic activity each week.  Maintain a healthy body weight.  Stop smoking or get help quitting.  Practice good foot care, including checking your feet daily for injuries and trimming toenails carefully.  Diabetic neuropathy - Symptoms & causes - [facility] Risk  factors Anyone who has diabetes can develop neuropathy. But these risk factors make nerve damage more likely: Poor blood suga...  [facility], Causative Inference, and Obesity-Related Neuropathy Although there is a strong causal inference argument that obesity causes neuropathy, more data are needed to conclusively make thi... [facility] Proceedings [place] Obesity is Associated With Neuropathy in the Severely ...  2020 [employer] for [employer] and Research ? [facility] Proc. 2020;95(7):1342-1353. Neuropathy is a highly prevalent... [facility] Proceedings Show all    Orders Placed This Encounter  Procedures   B12 and Folate Panel   Methylmalonic acid, serum   Vitamin B1   TSH Rfx on Abnormal to Free T4   Angiotensin converting enzyme   Sjogren's syndrome antibods(ssa + ssb)   Rheumatoid factor   Heavy metals, blood   Vitamin B6   Multiple Myeloma Panel (SPEP&IFE w/QIG)   ANA, IFA (with reflex)   Ambulatory referral to Sleep Studies   Meds ordered this encounter  Medications   cyanocobalamin (VITAMIN B12) injection 1,000 mcg   Alpha-Lipoic Acid 600 MG CAPS    Sig: Take 1 capsule (600 mg total) by mouth daily. Can increase to twice daily if no side effects    Cc: Judithann Sheen, PA,  Grayce Sessions, NP  Naomie Dean, MD  [facility] [address] [place], [place] [address]  Phone [phone] Fax [phone]

## 2023-02-25 LAB — TSH RFX ON ABNORMAL TO FREE T4: TSH: 0.591 u[IU]/mL (ref 0.450–4.500)

## 2023-02-26 ENCOUNTER — Other Ambulatory Visit: Payer: 59

## 2023-02-26 ENCOUNTER — Encounter: Payer: Self-pay | Admitting: Neurology

## 2023-02-26 DIAGNOSIS — M67979 Unspecified disorder of synovium and tendon, unspecified ankle and foot: Secondary | ICD-10-CM | POA: Insufficient documentation

## 2023-02-26 DIAGNOSIS — M79671 Pain in right foot: Secondary | ICD-10-CM | POA: Insufficient documentation

## 2023-02-26 MED ORDER — ALPHA-LIPOIC ACID 600 MG PO CAPS: 600.0000 mg | ORAL_CAPSULE | Freq: Every day | ORAL | Status: DC

## 2023-02-27 ENCOUNTER — Other Ambulatory Visit: Payer: Self-pay

## 2023-03-01 ENCOUNTER — Telehealth (INDEPENDENT_AMBULATORY_CARE_PROVIDER_SITE_OTHER): Payer: Self-pay | Admitting: Licensed Clinical Social Worker

## 2023-03-01 ENCOUNTER — Other Ambulatory Visit: Payer: Self-pay

## 2023-03-01 NOTE — Telephone Encounter (Signed)
LCSWA called patient today to introduce herself and to assess patients' mental health needs. Patient did not answer the phone. LCSWA was unable to leave a brief message with the patient asking them to return the call. Patient was referred by PCP for depression.

## 2023-03-03 ENCOUNTER — Other Ambulatory Visit: Payer: Self-pay

## 2023-03-09 LAB — VITAMIN B1: Thiamine: 81.3 nmol/L (ref 66.5–200.0)

## 2023-03-09 LAB — MULTIPLE MYELOMA PANEL, SERUM
Albumin SerPl Elph-Mcnc: 4.4 g/dL (ref 2.9–4.4)
Albumin/Glob SerPl: 1.4 (ref 0.7–1.7)
Alpha 1: 0.3 g/dL (ref 0.0–0.4)
Alpha2 Glob SerPl Elph-Mcnc: 0.9 g/dL (ref 0.4–1.0)
B-Globulin SerPl Elph-Mcnc: 1.2 g/dL (ref 0.7–1.3)
Gamma Glob SerPl Elph-Mcnc: 0.9 g/dL (ref 0.4–1.8)
Globulin, Total: 3.2 g/dL (ref 2.2–3.9)
IgA/Immunoglobulin A, Serum: 59 mg/dL — ABNORMAL LOW (ref 87–352)
IgG (Immunoglobin G), Serum: 946 mg/dL (ref 586–1602)
IgM (Immunoglobulin M), Srm: 66 mg/dL (ref 26–217)
Total Protein: 7.6 g/dL (ref 6.0–8.5)

## 2023-03-09 LAB — SJOGREN'S SYNDROME ANTIBODS(SSA + SSB)
ENA SSA (RO) Ab: <0.2 AI (ref 0.0–0.9)
ENA SSB (LA) Ab: 0.2 AI (ref 0.0–0.9)

## 2023-03-09 LAB — HEAVY METALS, BLOOD
Arsenic: 1 ug/L (ref 0–9)
Lead, Blood: <1 ug/dL (ref 0.0–3.4)
Mercury: <1 ug/L (ref 0.0–14.9)

## 2023-03-09 LAB — METHYLMALONIC ACID, SERUM: Methylmalonic Acid: 87 nmol/L (ref 0–378)

## 2023-03-09 LAB — ANTINUCLEAR ANTIBODIES, IFA: ANA Titer 1: NEGATIVE

## 2023-03-09 LAB — ANGIOTENSIN CONVERTING ENZYME: Angio Convert Enzyme: 42 U/L (ref 14–82)

## 2023-03-09 LAB — RHEUMATOID FACTOR: Rheumatoid fact SerPl-aCnc: <10 [IU]/mL (ref <14.0)

## 2023-03-09 LAB — VITAMIN B6: Vitamin B6: 3.2 ug/L — ABNORMAL LOW (ref 3.4–65.2)

## 2023-03-09 LAB — B12 AND FOLATE PANEL
Folate: 5.7 ng/mL (ref >3.0)
Vitamin B-12: 325 pg/mL (ref 232–1245)

## 2023-03-27 ENCOUNTER — Other Ambulatory Visit: Payer: Self-pay

## 2023-03-28 ENCOUNTER — Other Ambulatory Visit: Payer: Self-pay

## 2023-03-29 ENCOUNTER — Other Ambulatory Visit: Payer: Self-pay

## 2023-04-05 ENCOUNTER — Encounter (INDEPENDENT_AMBULATORY_CARE_PROVIDER_SITE_OTHER): Payer: Self-pay

## 2023-04-05 ENCOUNTER — Ambulatory Visit (INDEPENDENT_AMBULATORY_CARE_PROVIDER_SITE_OTHER): Payer: 59 | Admitting: Primary Care

## 2023-04-06 ENCOUNTER — Other Ambulatory Visit: Payer: Self-pay

## 2023-05-11 ENCOUNTER — Other Ambulatory Visit: Payer: Self-pay

## 2023-06-09 ENCOUNTER — Other Ambulatory Visit: Payer: Self-pay

## 2023-06-09 ENCOUNTER — Other Ambulatory Visit (INDEPENDENT_AMBULATORY_CARE_PROVIDER_SITE_OTHER): Payer: Self-pay | Admitting: Primary Care

## 2023-06-12 ENCOUNTER — Other Ambulatory Visit: Payer: Self-pay

## 2023-06-12 MED ORDER — METFORMIN HCL 500 MG PO TABS
250.0000 mg | ORAL_TABLET | Freq: Two times a day (BID) | ORAL | 1 refills | Status: DC
  Filled 2023-06-12: qty 30, 30d supply, fill #0
  Filled 2023-07-14: qty 30, 30d supply, fill #1
  Filled 2023-08-31: qty 30, 30d supply, fill #2
  Filled 2023-10-18: qty 30, 30d supply, fill #3

## 2023-06-12 NOTE — Telephone Encounter (Signed)
 Requested Prescriptions  Pending Prescriptions Disp Refills   metFORMIN (GLUCOPHAGE) 500 MG tablet 90 tablet 1    Sig: Take 0.5 tablets (250 mg total) by mouth 2 (two) times daily.     Endocrinology:  Diabetes - Biguanides Failed - 06/12/2023 11:12 AM      Failed - HBA1C is between 0 and 7.9 and within 180 days    HbA1c, POC (prediabetic range)  Date Value Ref Range Status  09/26/2022 6.1 5.7 - 6.4 % Final   Hgb A1c MFr Bld  Date Value Ref Range Status  11/16/2022 6.2 (H) 4.8 - 5.6 % Final    Comment:    (NOTE) Pre diabetes:          5.7%-6.4%  Diabetes:              >6.4%  Glycemic control for   <7.0% adults with diabetes          Passed - Cr in normal range and within 360 days    Creatinine, Ser  Date Value Ref Range Status  11/16/2022 0.61 0.44 - 1.00 mg/dL Final         Passed - eGFR in normal range and within 360 days    GFR calc Af Amer  Date Value Ref Range Status  04/04/2019 127 >59 mL/min/1.73 Final   GFR, Estimated  Date Value Ref Range Status  11/16/2022 >60 >60 mL/min Final    Comment:    (NOTE) Calculated using the CKD-EPI Creatinine Equation (2021)          Passed - B12 Level in normal range and within 720 days    Vitamin B-12  Date Value Ref Range Status  02/24/2023 325 232 - 1,245 pg/mL Final         Passed - Valid encounter within last 6 months    Recent Outpatient Visits           3 months ago Colon cancer screening   [place] [facility] Grayce Sessions, NP   6 months ago Tendinitis of both feet   [place] [facility] Grayce Sessions, NP   7 months ago Medication refill   [place] [facility] Grayce Sessions, NP   8 months ago Hyperglycemia   [place] [facility] Grayce Sessions, NP   2 years ago Current moderate episode of major depressive disorder, unspecified whether recurrent (HCC)   [place] [facility] Grayce Sessions, NP              Passed - CBC within normal limits and completed in the last 12 months    WBC  Date Value Ref Range Status  11/16/2022 7.1 4.0 - 10.5 K/uL Final   RBC  Date Value Ref Range Status  11/16/2022 4.73 3.87 - 5.11 MIL/uL Final   Hemoglobin  Date Value Ref Range Status  11/16/2022 10.2 (L) 12.0 - 15.0 g/dL Final  40/98/1191 47.8 11.1 - 15.9 g/dL Final   HCT  Date Value Ref Range Status  11/16/2022 36.2 36.0 - 46.0 % Final   Hematocrit  Date Value Ref Range Status  04/04/2019 37.4 34.0 - 46.6 % Final   MCHC  Date Value Ref Range Status  11/16/2022 28.2 (L) 30.0 - 36.0 g/dL Final   [facility]  Date Value Ref Range Status  11/16/2022 21.6 (L) 26.0 - 34.0 pg Final   MCV  Date Value Ref Range Status  11/16/2022 76.5 (L) 80.0 -  100.0 fL Final  04/04/2019 91 79 - 97 fL Final   No results found for: "PLTCOUNTKUC", "LABPLAT", "POCPLA" RDW  Date Value Ref Range Status  11/16/2022 18.4 (H) 11.5 - 15.5 % Final  04/04/2019 14.4 11.7 - 15.4 % Final

## 2023-06-16 ENCOUNTER — Other Ambulatory Visit: Payer: Self-pay

## 2023-07-14 ENCOUNTER — Other Ambulatory Visit: Payer: Self-pay

## 2023-07-25 ENCOUNTER — Other Ambulatory Visit: Payer: Self-pay

## 2023-08-20 ENCOUNTER — Inpatient Hospital Stay (HOSPITAL_COMMUNITY)
Admission: RE | Admit: 2023-08-20 | Discharge: 2023-08-20 | Disposition: A | Discharge status: Home / Self Care | Payer: Self-pay | Source: Ambulatory Visit

## 2023-08-20 ENCOUNTER — Ambulatory Visit (HOSPITAL_COMMUNITY)
Admission: EM | Admit: 2023-08-20 | Discharge: 2023-08-20 | Disposition: A | Discharge status: Home / Self Care | Attending: Emergency Medicine | Admitting: Emergency Medicine

## 2023-08-20 ENCOUNTER — Encounter (HOSPITAL_COMMUNITY): Payer: Self-pay | Admitting: Emergency Medicine

## 2023-08-20 DIAGNOSIS — M79632 Pain in left forearm: Secondary | ICD-10-CM | POA: Diagnosis not present

## 2023-08-20 DIAGNOSIS — M546 Pain in thoracic spine: Secondary | ICD-10-CM

## 2023-08-20 DIAGNOSIS — R202 Paresthesia of skin: Secondary | ICD-10-CM | POA: Diagnosis not present

## 2023-08-20 DIAGNOSIS — M79631 Pain in right forearm: Secondary | ICD-10-CM | POA: Diagnosis not present

## 2023-08-20 LAB — POCT URINALYSIS DIP (MANUAL ENTRY)
Bilirubin, UA: NEGATIVE
Blood, UA: NEGATIVE
Glucose, UA: NEGATIVE mg/dL
Ketones, POC UA: NEGATIVE mg/dL
Leukocytes, UA: NEGATIVE
Nitrite, UA: NEGATIVE
Protein Ur, POC: NEGATIVE mg/dL
Spec Grav, UA: 1.025 (ref 1.010–1.025)
Urobilinogen, UA: 1 U/dL
pH, UA: 7 (ref 5.0–8.0)

## 2023-08-20 MED ORDER — LIDOCAINE 5 % EX PTCH: 1.0000 | MEDICATED_PATCH | CUTANEOUS | 0 refills | Status: DC

## 2023-08-20 MED ORDER — DEXAMETHASONE SODIUM PHOSPHATE 10 MG/ML IJ SOLN
INTRAMUSCULAR | Status: AC
  Filled 2023-08-20: qty 1

## 2023-08-20 MED ORDER — PREDNISONE 20 MG PO TABS: 40.0000 mg | ORAL_TABLET | Freq: Every day | ORAL | 0 refills | Status: AC

## 2023-08-20 MED ORDER — DEXAMETHASONE SODIUM PHOSPHATE 10 MG/ML IJ SOLN
10.0000 mg | Freq: Once | INTRAMUSCULAR | Status: AC
  Administered 2023-08-20: 10 mg via INTRAMUSCULAR

## 2023-08-20 NOTE — ED Triage Notes (Signed)
 Pt adds back pain since Friday when lifted a slicer at work.

## 2023-08-20 NOTE — ED Provider Notes (Signed)
 [facility]    CSN: [id number] Arrival date & time: 08/20/23  1204      History   Chief Complaint Chief Complaint  Patient presents with   Numbness    HPI Gina Shelton is a 48 y.o. female.   Patient presents with bilateral hand numbness and bilateral forearm pain for several months.  Patient states that when she has to lift things this numbness and pain become worse. Patient states the pain is aching and throbbing in her forearms upon lifting or moving her arms.  Patient does report a history of carpal tunnel.  Patient denies any new injury.  She has had some left-sided back pain since 6/6 after she was lifting a slicer while at work.  Patient reports the pain is worse with movement.  Patient does report some mild urinary frequency, but states that she does take hydrochlorothiazide  and this makes her pee more frequently.  Denies dysuria, urinary urgency, hematuria, and abdominal pain.  Patient denies taking medication for pain.  The history is provided by the patient and medical records.    Past Medical History:  Diagnosis Date   Anxiety    COPD (chronic obstructive pulmonary disease) (HCC)    Depression    Depression    Diabetes mellitus without complication (HCC)    GERD (gastroesophageal reflux disease)    Gout    ble   Hypertension    Migraine    Migraine     Patient Active Problem List   Diagnosis Date Noted   Pain in both feet 02/26/2023   Class 3 severe obesity due to excess calories with serious comorbidity and body mass index (BMI) of 40.0 to 44.9 in adult 02/26/2023   Achilles tendon disorder 02/26/2023   Low back pain 04/22/2021   Chronic pain of left ankle 10/21/2020   Left foot pain 10/21/2020   COPD exacerbation (HCC) 09/10/2019   Chest pain 09/27/2017   Polysubstance abuse (HCC) 09/27/2017   Hypertensive urgency 09/26/2017   Obesity (BMI 30.0-34.9) 05/22/2017   Ovarian cyst 05/22/2017   Cavernous hemangioma of liver 05/22/2017    Major depressive disorder, single episode, severe without psychosis (HCC) 12/31/2015   Major depressive disorder, 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 Authorizing Provider  lidocaine  (LIDODERM ) 5 % Place 1 patch onto the skin daily. Remove & Discard patch within 12 hours or as directed by MD 08/20/23  Yes Levora Reas A, NP  predniSONE  (DELTASONE ) 20 MG tablet Take 2 tablets (40 mg total) by mouth daily for 5 days. 08/20/23 08/25/23 Yes Levora Reas A, NP  Acetaminophen  (TYLENOL  PO) Take  500 mg by mouth as needed.    [provider]  albuterol  (VENTOLIN  HFA) 108 (90 Base) MCG/ACT inhaler Inhale 2 puffs into the lungs every 4 (four) hours as needed for wheezing or shortness of breath. 01/29/23   Marius Siemens, NP  Alpha-Lipoic Acid 600 MG CAPS Take 1 capsule (600 mg total) by mouth daily. Can increase to twice daily if no side effects 02/26/23   Glory Larsen, MD  Blood Pressure Monitor KIT 1 kit by Does not apply route 3 (three) times daily as needed. 04/04/19   Marius Siemens, NP  cholecalciferol (VITAMIN D3) 25 MCG (1000 UNIT) tablet Take 1 tablet (1,000 Units total) by mouth every morning. 09/26/22   Marius Siemens, NP  colchicine  0.6 MG tablet Take 1 tablet (0.6 mg total) by mouth daily as needed (gout pain). 10/27/22   Marius Siemens, NP  esomeprazole  (NEXIUM ) 40 MG  capsule Take 1 capsule (40 mg total) by mouth daily. 10/27/22   Marius Siemens, NP  fluticasone  (FLONASE ) 50 MCG/ACT nasal spray Place 2 sprays into both nostrils daily. 10/27/22   Marius Siemens, NP  gabapentin  (NEURONTIN ) 300 MG capsule Take 1 capsule (300 mg total) by mouth 2 (two) times daily. 12/02/22   Standiford, Karlene Overcast, DPM  gabapentin  (NEURONTIN ) 300 MG capsule Take 1 capsule (300 mg total) by mouth 3 (three) times daily. 12/29/22   Standiford, Karlene Overcast, DPM  hydrochlorothiazide  (HYDRODIURIL ) 25 MG tablet TAKE 1 TABLET (25 MG TOTAL) BY MOUTH DAILY. 01/29/23 01/29/24  Marius Siemens, NP  ibuprofen  (ADVIL ) 200 MG tablet Take 200 mg by mouth as needed.    [provider]  levocetirizine (XYZAL  ALLERGY 24HR) 5 MG tablet Take 1 tablet (5 mg total) by mouth every evening. 10/27/22   Marius Siemens, NP  metFORMIN  (GLUCOPHAGE ) 500 MG tablet Take 0.5 tablets (250 mg total) by mouth 2 (two) times daily. 06/12/23   Marius Siemens, NP  sertraline  (ZOLOFT ) 50 MG tablet Take 3 tablets (150 mg total) by mouth every morning. 02/21/23   Marius Siemens, NP    Family History Family History  Problem Relation Age of Onset   Hypertension Mother    Arthritis Sister    Anesthesia problems Neg Hx    Hypotension Neg Hx    Malignant hyperthermia Neg Hx    Pseudochol deficiency Neg Hx    Neuropathy Neg Hx     Social History Social History   Tobacco Use   Smoking status: Every Day    Current packs/day: 1.00    Types: Cigarettes   Smokeless tobacco: Never  Vaping Use   Vaping status: Never Used  Substance Use Topics   Alcohol  use: Not Currently    Comment: pt states no beer for 1 year and seven months   Drug use: No     Allergies   Patient has no known allergies.   Review of Systems Review of Systems  Per HPI  Physical Exam Triage Vital Signs ED Triage Vitals  Encounter Vitals Group     BP 08/20/23 1218 108/78     Systolic BP Percentile --       Diastolic BP Percentile --      Pulse Rate 08/20/23 1218 76     Resp 08/20/23 1218 18     Temp 08/20/23 1218 98.5 F (36.9 C)     Temp Source 08/20/23 1218 Oral     SpO2 08/20/23 1218 97 %  Weight --      Height --      Head Circumference --      Peak Flow --      Pain Score 08/20/23 1215 0     Pain Loc --      Pain Education --      Exclude from Growth Chart --    No data found.  Updated Vital Signs BP 108/78 (BP Location: Left Arm)   Pulse 76   Temp 98.5 F (36.9 C) (Oral)   Resp 18   LMP 08/01/2023 (Approximate)   SpO2 97%   Visual Acuity Right Eye Distance:   Left Eye Distance:   Bilateral Distance:    Right Eye Near:   Left Eye Near:    Bilateral Near:     Physical Exam Vitals and nursing note reviewed.  Constitutional:      General: She is awake. She is not in acute distress.    Appearance: Normal appearance. She is well-developed and well-groomed. She is not ill-appearing.  Musculoskeletal:     Right forearm: Tenderness present. No swelling, edema, deformity or bony tenderness.     Left forearm: Tenderness present. No swelling, edema, deformity or bony tenderness.     Right wrist: Normal.     Left wrist: Normal.     Right hand: Normal.     Left hand: Normal.     Cervical back: Normal.     Thoracic back: Tenderness present. No swelling, edema, deformity, signs of trauma or bony tenderness. Normal range of motion.     Lumbar back: Normal.       Back:     Comments:    Skin:    General: Skin is warm and dry.  Neurological:     Mental Status: She is alert.  Psychiatric:        Behavior: Behavior is cooperative.      UC Treatments / Results  Labs (all labs ordered are listed, but only abnormal results are displayed) Labs Reviewed  POCT URINALYSIS DIP (MANUAL ENTRY)    EKG   Radiology No results found.  Procedures Procedures (including critical care time)  Medications Ordered in UC Medications  dexamethasone  (DECADRON )  injection 10 mg (has no administration in time range)    Initial Impression / Assessment and Plan / UC Course  I have reviewed the triage vital signs and the nursing notes.  Pertinent labs & imaging results that were available during my care of the patient were reviewed by me and considered in my medical decision making (see chart for details).     Patient is well-appearing.  Vitals are stable.  Upon assessment tenderness noted to bilateral upper forearms as well as left thoracic back.  Without swelling, edema, trauma, obvious deformity, or decreased range of motion.  Urinalysis unremarkable.  Back pain likely muscular in nature.  Given IM Decadron  in clinic.  Prescribed prednisone  burst.  Prescribed lidocaine  patches for additional pain relief.  Recommended taking Tylenol  and ibuprofen  as needed for pain.  Given orthopedic follow-up.  Discussed follow-up and return precautions. Final Clinical Impressions(s) / UC Diagnoses   Final diagnoses:  Paresthesias  Pain in both forearms  Acute left-sided thoracic back pain     Discharge Instructions      Your urinalysis did not reveal any signs of infection.   You were given an injection of Decadron  today which is a steroid to help with inflammation causing your pain. Start taking 2 tablets of prednisone  tomorrow once daily  for 5 days. Apply lidocaine  patch with 12 hours at a time once daily to the pain of your back. Otherwise alternate between 650 mg of Tylenol  and 400 mg ibuprofen  every 6-8 hours as needed for pain. You can also alternate between ice and heat as needed for pain. Follow-up with [facility] if pain continues. Follow-up with primary care provider or return here as needed.  ED Prescriptions     Medication Sig Dispense Auth. Provider   lidocaine  (LIDODERM ) 5 % Place 1 patch onto the skin daily. Remove & Discard patch within 12 hours or as directed by MD 30 patch Levora Reas A, NP   predniSONE   (DELTASONE ) 20 MG tablet Take 2 tablets (40 mg total) by mouth daily for 5 days. 10 tablet Levora Reas A, NP      PDMP not reviewed this encounter.   Levora Reas A, NP 08/20/23 1345

## 2023-08-20 NOTE — ED Triage Notes (Signed)
 Pt reports for several months been having numbness in bilateral forearms esp when holding her arms bend and upwards. Reports carpal tunnel. Reports will have aching and throbbing pain esp with moving of arms and lifting.

## 2023-08-20 NOTE — Discharge Instructions (Addendum)
 Your urinalysis did not reveal any signs of infection.   You were given an injection of Decadron  today which is a steroid to help with inflammation causing your pain. Start taking 2 tablets of prednisone  tomorrow once daily for 5 days. Apply lidocaine  patch with 12 hours at a time once daily to the pain of your back. Otherwise alternate between 650 mg of Tylenol  and 400 mg ibuprofen  every 6-8 hours as needed for pain. You can also alternate between ice and heat as needed for pain. Follow-up with [facility] if pain continues. Follow-up with primary care provider or return here as needed.

## 2023-08-28 ENCOUNTER — Encounter: Payer: Self-pay | Admitting: Neurology

## 2023-08-28 ENCOUNTER — Ambulatory Visit: Payer: 59 | Admitting: Neurology

## 2023-08-28 NOTE — Progress Notes (Deleted)
 [facility]    Provider:  Dr Tresia Fruit Requesting Provider: Marius Siemens, NP Primary Care Provider:  Marius Siemens, NP  CC:  peripheral neuropathy  HPI 02/24/2023:  Gina Shelton is a 48 y.o. female here as requested by Marius Siemens, NP for peripheral neuropathy. has DEPRESSION, MAJOR, RECURRENT; ANXIETY STATE NOS; Alcohol  dependence (HCC); Cocaine abuse (HCC); Major depressive disorder, single episode, severe without psychosis (HCC); Major depressive disorder, recurrent severe without psychotic features (HCC); Obesity (BMI 30.0-34.9); Ovarian cyst; Cavernous hemangioma of liver; Hypertensive urgency; Chest pain; Polysubstance abuse (HCC); COPD exacerbation (HCC); Chronic pain of left ankle; Left foot pain; Low back pain; Pain in both feet; Class 3 severe obesity due to excess calories with serious comorbidity and body mass index (BMI) of 40.0 to 44.9 in adult; and Achilles tendon disorder on their problem list.  Also polysubstance abuse, EtOH abuse, sciatica, COPD, hypertension, non-insulin-dependent diabetes presented to the emergency room with burning in her feet bilaterally that has been occurring over the past month, recently started working at [employer] after she was incarcerated for urine out of work this past year, she has also gained about 100 pounds over the past year a lot of work and lived a mostly sedentary lifestyle, at RV she reports that she is on her feet all day and requires frequent rest breaks due to pain in feet.  She was also seen in August 2020 for for similar pain to bilateral feet was given a prednisone  burst without relief.  She was referred to neurology in September of this year by Sheralyn Dies, PA for foot pain bilaterally.  I reviewed these notes, patient recently released from prison, incarcerated for a year during which time she was diagnosed with diabetes, has had a progressive bilateral lower extremity pain in her feet which is a  burning sensation for which she has been treated as peripheral neuropathy, this cause is not exactly clear as her diabetes seems to be somewhat well-controlled based on her A1c, no other findings including swelling edema no asymmetry, rest of her exam is unremarkable, was asked to follow-up outpatient, patient reported bilateral foot pain that is burning tingling numbness in left foot swollen from ankle down to foot.  10 out of 10 on a pain scale.  Gabapentin  and over-the-counter pain medications did not improve the pain or burning or other symptoms.  She followed up with primary care.  I reviewed notes on 11/22/2022 where she followed up with her primary care [facility]. Moira Andrews edwards noted no swelling, she also saw [facility] in September.   Gained 100 pounds within this year. She has a hx of alcohol  abuse. Incarcerated and found to have pre-diabetes. Drank a pint a day since she was 23 a pint a day been clean for over a year. Symptoms started in July and started working on her feet all day. The whole foot closer near the heel but toes will go to sleep when ending shift after being on feet. Saw podiatry and dxed with achilles tendonitis. Started acutely when being on her feet. Also the distribution near the heel is less likely for peripheral neuropathy. Gets better when off her feet but she still has burning and tingling in the heels and points to the plantar fascia on the side hear the heels. Neuropaty would not start acutely andappears to be more achilles tendonitis. No other focal neurologic deficits, associated symptoms, inciting events or modifiable factors.   Sleep: snoring, excessive  daytime fatigue, morning headaches, wakes with dry mouth, gained 100 pound, malampati 4, large neck, morbid obesity    F/up b/l achilles tendinitis. Patient is in severe pain.      48 y.o. female presents for follow-up on Achilles tendinitis as well as pain along the inside of the  rear foot.  Patient having severe pain today.  Having difficulty walking and with any pressure along the inside of her ankles.  Pain is worse with standing and walking.  She says the steroid shot given at last appointment did help for about 2 weeks and she is requesting another today.  Has been using a cam boot occasionally especially at work which does help slightly as well.  Reviewed notes, labs and imaging from outside physicians, which showed: see above  Review of Systems: Patient complains of symptoms per HPI as well as the following symptoms weight gain, fatigue. Pertinent negatives and positives per HPI. All others negative.   Social History   Socioeconomic History   Marital status: Single    Spouse name: Not on file   Number of children: Not on file   Years of education: Not on file   Highest education level: Not on file  Occupational History   Not on file  Tobacco Use   Smoking status: Every Day    Current packs/day: 1.00    Types: Cigarettes   Smokeless tobacco: Never  Vaping Use   Vaping status: Never Used  Substance and Sexual Activity   Alcohol  use: Not Currently    Comment: pt states no beer for 1 year and seven months   Drug use: No   Sexual activity: Yes  Other Topics Concern   Not on file  Social History Narrative   ** Merged History Encounter ** ** Merged History Encounter **       Lives with daughter    Pt works    Social Drivers of Corporate investment banker Strain: Not on file  Food Insecurity: Not on file  Transportation Needs: Not on file  Physical Activity: Not on file  Stress: Not on file  Social Connections: Not on file  Intimate Partner Violence: Not on file    Family History  Problem Relation Age of Onset   Hypertension Mother    Arthritis Sister    Anesthesia problems Neg Hx    Hypotension Neg Hx    Malignant hyperthermia Neg Hx    Pseudochol deficiency Neg Hx    Neuropathy Neg Hx     Past Medical History:  Diagnosis Date    Anxiety    COPD (chronic obstructive pulmonary disease) (HCC)    Depression    Depression    Diabetes mellitus without complication (HCC)    GERD (gastroesophageal reflux disease)    Gout    ble   Hypertension    Migraine    Migraine     Patient Active Problem List   Diagnosis Date Noted   Pain in both feet 02/26/2023   Class 3 severe obesity due to excess calories with serious comorbidity and body mass index (BMI) of 40.0 to 44.9 in adult 02/26/2023   Achilles tendon disorder 02/26/2023   Low back pain 04/22/2021   Chronic pain of left ankle 10/21/2020   Left foot pain 10/21/2020   COPD exacerbation (HCC) 09/10/2019   Chest pain 09/27/2017   Polysubstance abuse (HCC) 09/27/2017   Hypertensive urgency 09/26/2017   Obesity (BMI 30.0-34.9) 05/22/2017   Ovarian cyst 05/22/2017   Cavernous  hemangioma of liver 05/22/2017   Major depressive disorder, single episode, severe without psychosis (HCC) 12/31/2015   Major depressive disorder, recurrent severe without psychotic features (HCC) 12/31/2015   Alcohol  dependence (HCC) 06/07/2013   Cocaine abuse (HCC) 06/07/2013   DEPRESSION, MAJOR, RECURRENT 08/17/2007   ANXIETY STATE NOS 11/15/2006    Past Surgical History:  Procedure Laterality Date   CESAREAN SECTION  infection at incission requiring return to OR x 2   TUBAL LIGATION      Current Outpatient Medications  Medication Sig Dispense Refill   Acetaminophen  (TYLENOL  PO) Take 500 mg by mouth as needed.     albuterol  (VENTOLIN  HFA) 108 (90 Base) MCG/ACT inhaler Inhale 2 puffs into the lungs every 4 (four) hours as needed for wheezing or shortness of breath. 6.7 g 1   Alpha-Lipoic Acid 600 MG CAPS Take 1 capsule (600 mg total) by mouth daily. Can increase to twice daily if no side effects     Blood Pressure Monitor KIT 1 kit by Does not apply route 3 (three) times daily as needed. 1 kit 0   cholecalciferol (VITAMIN D3) 25 MCG (1000 UNIT) tablet Take 1 tablet (1,000 Units total)  by mouth every morning. 90 tablet 1   colchicine  0.6 MG tablet Take 1 tablet (0.6 mg total) by mouth daily as needed (gout pain). 90 tablet 1   esomeprazole  (NEXIUM ) 40 MG capsule Take 1 capsule (40 mg total) by mouth daily. 90 capsule 1   fluticasone  (FLONASE ) 50 MCG/ACT nasal spray Place 2 sprays into both nostrils daily. 16 g 3   gabapentin  (NEURONTIN ) 300 MG capsule Take 1 capsule (300 mg total) by mouth 2 (two) times daily. 60 capsule 2   gabapentin  (NEURONTIN ) 300 MG capsule Take 1 capsule (300 mg total) by mouth 3 (three) times daily. 90 capsule 3   hydrochlorothiazide  (HYDRODIURIL ) 25 MG tablet TAKE 1 TABLET (25 MG TOTAL) BY MOUTH DAILY. 90 tablet 1   ibuprofen  (ADVIL ) 200 MG tablet Take 200 mg by mouth as needed.     levocetirizine (XYZAL  ALLERGY 24HR) 5 MG tablet Take 1 tablet (5 mg total) by mouth every evening. 90 tablet 1   lidocaine  (LIDODERM ) 5 % Place 1 patch onto the skin daily. Remove & Discard patch within 12 hours or as directed by MD 30 patch 0   metFORMIN  (GLUCOPHAGE ) 500 MG tablet Take 0.5 tablets (250 mg total) by mouth 2 (two) times daily. 90 tablet 1   sertraline  (ZOLOFT ) 50 MG tablet Take 3 tablets (150 mg total) by mouth every morning. 180 tablet 2   No current facility-administered medications for this visit.    Allergies as of 08/28/2023   (No Known Allergies)    Vitals: LMP 08/01/2023 (Approximate)  Last Weight:  Wt Readings from Last 1 Encounters:  02/24/23 233 lb 6.4 oz (105.9 kg)   Last Height:   Ht Readings from Last 1 Encounters:  02/24/23 5' 2 (1.575 m)     Physical exam: Exam: Gen: NAD, conversant, well nourised, obese, well groomed                     CV: RRR, no MRG. No Carotid Bruits. No peripheral edema, warm, nontender Eyes: Conjunctivae clear without exudates or hemorrhage  Neuro: Detailed Neurologic Exam  Speech:    Speech is normal; fluent and spontaneous with normal comprehension.  Cognition:    The patient is oriented to  person, place, and time;     recent and remote  memory intact;     language fluent;     normal attention, concentration,     fund of knowledge Cranial Nerves:    The pupils are equal, round, and reactive to light. The fundi are normal and spontaneous venous pulsations are present. Visual fields are full to finger confrontation. Extraocular movements are intact. Trigeminal sensation is intact and the muscles of mastication are normal. The face is symmetric. The palate elevates in the midline. Hearing intact. Voice is normal. Shoulder shrug is normal. The tongue has normal motion without fasciculations.   Coordination:    Normal finger to nose and heel to shin. Normal rapid alternating movements.   Gait: **antalgic due to foot pain, slighlt wide based due to body habitus  Motor Observation:    No asymmetry, no atrophy, and no involuntary movements noted. Tone:    Normal muscle tone.    Posture:    Posture is normal. normal erect    Strength:    Strength is V/V in the upper and lower limbs.      Sensation: decreased distally in the foot to temp/cold vibration and proprioception intact     Reflex Exam:  DTR's:    Deep tendon reflexes in the upper and lower extremities are normal bilaterally.   Toes:    The toes are downgoing bilaterally.   Clonus:    Clonus is absent.    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  Sleep: snoring, excessive daytime fatigue, morning headaches, wakes with dry mouth, gained 100 pound, malampati  4, large neck, morbid obesity, ESS 17  B12 deficiency? was very low at 215 will recheck with MMA Full panel for other causes of neuropathy Anemia: follow up with primary care Get the hgbac as low as possible: -May consider daily alpha lipoic acid which is an antioxidant that may reduce free radical oxidative stress associated with diabetic polyneuropathy, existing evidence suggests that alpha lipoic acid significantly reduces stabbing, lancinating and burning pain and diabetic neuropathy with its onset of action as early as 1-2 weeks. 600mg  once daily Thorough evaluation for peripheral neuropathy likely has a small fiber neuropathy very mild but acute symptoms less likley due to neuropathy and more likely due to her diagnosed achilles tendonitis Weight loss : According to the [facility], obesity is a metabolic factor that can increase the risk of neuropathy, even in the absence of diabetes:  Stop smoking  Prevalence discussed Neuropathy is common in people with obesity, affecting 23% of people with obesity, even in those without diabetes.  Waist circumference Waist circumference is more closely associated with neuropathy than general obesity.  Early signs Subtle signs of neuropathy can be present in many obese people who don't have obvious neuropathy yet.  Other risk factors for neuropathy include: Diabetes, Anyone with diabetes or even pre-diabetes can develop neuropathy, and the risk increases with the length of time someone has diabetes.  Eat a healthy diet with lots of fruits, vegetables, whole grains, and lean protein.  Limit foods high in saturated fat.  Exercise regularly, including at least  150 minutes of moderate to vigorous aerobic activity each week.  Maintain a healthy body weight.  Stop smoking or get help quitting.  Practice good foot care, including checking your feet daily for injuries and trimming toenails carefully.  Diabetic neuropathy - Symptoms & causes - [facility] Risk  factors Anyone who has diabetes can develop neuropathy. But these risk factors make nerve damage more likely: Poor blood suga...  [facility], Causative Inference, and Obesity-Related Neuropathy Although there is a strong causal inference argument that obesity causes neuropathy, more data are needed to conclusively make thi... [facility] Proceedings [place] Obesity is Associated With Neuropathy in the Severely ...  2020 [employer] for [employer] and Research ? [facility] Proc. 2020;95(7):1342-1353. Neuropathy is a highly prevalent... [facility] Proceedings Show all    No orders of the defined types were placed in this encounter.  No orders of the defined types were placed in this encounter.   Cc: Marius Siemens, NP,  Marius Siemens, NP  Aldona Amel, MD  [facility] [address] [place], [place] [address]  Phone [phone] Fax [phone]

## 2023-08-31 ENCOUNTER — Other Ambulatory Visit (INDEPENDENT_AMBULATORY_CARE_PROVIDER_SITE_OTHER): Payer: Self-pay | Admitting: Primary Care

## 2023-08-31 ENCOUNTER — Ambulatory Visit: Payer: Self-pay

## 2023-08-31 ENCOUNTER — Other Ambulatory Visit (HOSPITAL_COMMUNITY): Payer: Self-pay

## 2023-08-31 ENCOUNTER — Other Ambulatory Visit: Payer: Self-pay | Admitting: Podiatry

## 2023-08-31 ENCOUNTER — Other Ambulatory Visit: Payer: Self-pay

## 2023-09-01 ENCOUNTER — Other Ambulatory Visit: Payer: Self-pay

## 2023-09-01 MED ORDER — LEVOCETIRIZINE DIHYDROCHLORIDE 5 MG PO TABS
5.0000 mg | ORAL_TABLET | Freq: Every evening | ORAL | 1 refills | Status: DC
  Filled 2023-09-01 – 2023-10-18 (×2): qty 90, 90d supply, fill #0
  Filled 2024-02-09: qty 90, 90d supply, fill #1

## 2023-09-01 MED ORDER — GABAPENTIN 300 MG PO CAPS
300.0000 mg | ORAL_CAPSULE | Freq: Three times a day (TID) | ORAL | 3 refills | Status: DC
  Filled 2023-09-01 – 2023-10-18 (×2): qty 90, 30d supply, fill #0
  Filled 2024-02-09: qty 90, 30d supply, fill #1

## 2023-09-13 ENCOUNTER — Other Ambulatory Visit: Payer: Self-pay

## 2023-09-18 ENCOUNTER — Ambulatory Visit
Admission: RE | Admit: 2023-09-18 | Discharge: 2023-09-18 | Disposition: A | Discharge status: Home / Self Care | Payer: Self-pay | Source: Ambulatory Visit | Attending: Nurse Practitioner | Admitting: Nurse Practitioner

## 2023-09-18 ENCOUNTER — Ambulatory Visit

## 2023-09-18 ENCOUNTER — Other Ambulatory Visit: Payer: Self-pay

## 2023-09-18 VITALS — BP 109/75 | HR 79 | Temp 98.2°F | Resp 18 | Wt 233.5 lb

## 2023-09-18 DIAGNOSIS — G5603 Carpal tunnel syndrome, bilateral upper limbs: Secondary | ICD-10-CM

## 2023-09-18 DIAGNOSIS — M25512 Pain in left shoulder: Secondary | ICD-10-CM

## 2023-09-18 DIAGNOSIS — S46912A Strain of unspecified muscle, fascia and tendon at shoulder and upper arm level, left arm, initial encounter: Secondary | ICD-10-CM

## 2023-09-18 DIAGNOSIS — S161XXA Strain of muscle, fascia and tendon at neck level, initial encounter: Secondary | ICD-10-CM

## 2023-09-18 MED ORDER — KETOROLAC TROMETHAMINE 30 MG/ML IJ SOLN
60.0000 mg | Freq: Once | INTRAMUSCULAR | Status: AC
  Administered 2023-09-18: 60 mg via INTRAMUSCULAR

## 2023-09-18 MED ORDER — DEXAMETHASONE SODIUM PHOSPHATE 10 MG/ML IJ SOLN
10.0000 mg | Freq: Once | INTRAMUSCULAR | Status: AC
  Administered 2023-09-18: 10 mg via INTRAMUSCULAR

## 2023-09-18 MED ORDER — NAPROXEN 500 MG PO TABS
500.0000 mg | ORAL_TABLET | Freq: Two times a day (BID) | ORAL | 0 refills | Status: DC
Start: 2023-09-18 — End: 2023-11-23
  Filled 2023-09-18: qty 20, 10d supply, fill #0

## 2023-09-18 MED ORDER — METHOCARBAMOL 500 MG PO TABS
500.0000 mg | ORAL_TABLET | Freq: Every morning | ORAL | 0 refills | Status: DC
  Filled 2023-09-18: qty 10, 10d supply, fill #0

## 2023-09-18 MED ORDER — CYCLOBENZAPRINE HCL 10 MG PO TABS
10.0000 mg | ORAL_TABLET | Freq: Every day | ORAL | 0 refills | Status: DC
  Filled 2023-09-18: qty 10, 10d supply, fill #0

## 2023-09-18 NOTE — ED Triage Notes (Signed)
 Pt presents c/o left shoulder pain x 3 days. Pt says her range of motion is limited. Pt says she was unloading a truck at work 3 days ago and then felt pain in her shoulder afterwards that radiates down her arm. Pt says her fingers also go numb after about 5 minutes of holding anything and that is in both hands.

## 2023-09-18 NOTE — ED Provider Notes (Signed)
 [name]    CSN: [id number] Arrival date & time: 09/18/23  1338      History   Chief Complaint Chief Complaint  Patient presents with   Shoulder Pain    Entered by patient    HPI [name] is a 48 y.o. female.   Discussed the use of AI scribe software for clinical note transcription with the patient, who gave verbal consent to proceed.   The patient presents with chest pain, back pain, and shoulder pain that started 3 days ago while unloading a truck. The patient has a history of diabetes and hypertension. The pain initially began in the chest and then escalated to the back and up and down the shoulder. The patient reports pain across the upper chest, in the middle and top of the back, extending all the way across. The pain is severe enough to cause shortness of breath when moving, described as it takes my breath away because it's so painful. The patient has tried taking ibuprofen  for relief but reports it has not been effective at all.  Additionally, the patient reports ongoing numbness in both hands, which predates the current pain episode. This numbness occurs after holding objects for about 5 minutes, requiring the patient to put their arms straight down for the feeling to slowly return. The hand numbness is worse at night. The patient mentions being previously told at urgent care that this was carpal tunnel syndrome.   The patient denies any leg swelling, palpitations, feeling of racing heart, headaches, dizziness, or vision problems. They report taking metformin  for diabetes, hydrochlorothiazide  for blood pressure, and gabapentin  twice daily.  The following portions of the patient's history were reviewed and updated as appropriate: allergies, current medications, past family history, past medical history, past social history, past surgical history, and problem list.    Past Medical History:  Diagnosis Date   Anxiety    COPD (chronic obstructive  pulmonary disease) (HCC)    Depression    Depression    Diabetes mellitus without complication (HCC)    GERD (gastroesophageal reflux disease)    Gout    ble   Hypertension    Migraine    Migraine     Patient Active Problem List   Diagnosis Date Noted   Pain in both feet 02/26/2023   Class 3 severe obesity due to excess calories with serious comorbidity and body mass index (BMI) of 40.0 to 44.9 in adult 02/26/2023   Achilles tendon disorder 02/26/2023   Low back pain 04/22/2021   Chronic pain of left ankle 10/21/2020   Left foot pain 10/21/2020   COPD exacerbation (HCC) 09/10/2019   Chest pain 09/27/2017   Polysubstance abuse (HCC) 09/27/2017   Hypertensive urgency 09/26/2017   Obesity (BMI 30.0-34.9) 05/22/2017   Ovarian cyst 05/22/2017   Cavernous hemangioma of liver 05/22/2017   Major depressive disorder, single episode, severe without psychosis (HCC) 12/31/2015   Major depressive disorder, 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 Authorizing Provider  cyclobenzaprine  (FLEXERIL ) 10 MG tablet Take 1 tablet (10 mg total) by mouth at bedtime. 09/18/23  Yes Story Conti, FNP  methocarbamol  (ROBAXIN ) 500 MG tablet Take 1 tablet (500 mg total) by mouth every morning. 09/18/23  Yes Gussie Towson, FNP  naproxen  (NAPROSYN ) 500 MG tablet Take 1 tablet (500 mg total) by mouth 2 (two) times  daily with a meal. Take with food to avoid stomach upset. Do not take any additional NSAIDs while on this. You may take tylenol  in addition to this if needed for extra pain relief. 09/18/23  Yes Icholas Irby, FNP  Acetaminophen  (TYLENOL  PO) Take 500 mg by mouth as needed.    [provider]  albuterol  (VENTOLIN  HFA) 108 (90 Base) MCG/ACT inhaler Inhale 2 puffs into the lungs every 4 (four) hours as needed for wheezing or shortness of breath. 01/29/23   Celestia Rosaline SQUIBB, NP  Alpha-Lipoic Acid 600 MG CAPS Take 1 capsule (600 mg total) by mouth daily. Can increase to twice daily if no side effects 02/26/23   Ines Onetha NOVAK, MD  Blood Pressure Monitor KIT 1 kit by Does not apply route 3 (three) times daily as needed. 04/04/19   Celestia Rosaline SQUIBB, NP  cholecalciferol (VITAMIN D3) 25 MCG (1000 UNIT) tablet Take 1 tablet (1,000 Units total) by mouth every morning. 09/26/22   Celestia Rosaline SQUIBB, NP  colchicine  0.6 MG tablet Take 1 tablet (0.6 mg total) by mouth daily as needed (gout pain). 10/27/22   Celestia Rosaline SQUIBB, NP  esomeprazole  (NEXIUM ) 40 MG capsule Take 1 capsule (40 mg total) by mouth daily. 10/27/22   Celestia Rosaline SQUIBB, NP  fluticasone  (FLONASE ) 50 MCG/ACT nasal spray Place 2 sprays into both nostrils daily. 10/27/22   Celestia Rosaline SQUIBB, NP  gabapentin  (NEURONTIN ) 300 MG capsule Take 1 capsule (300 mg total) by mouth 2 (two) times daily. 12/02/22   Standiford, Marsa FALCON, DPM  gabapentin  (NEURONTIN ) 300 MG capsule Take 1 capsule (300 mg total) by mouth 3 (three) times daily. 09/01/23   Standiford, Marsa FALCON, DPM  hydrochlorothiazide  (HYDRODIURIL ) 25 MG tablet TAKE 1 TABLET (25 MG TOTAL) BY MOUTH DAILY. 01/29/23 01/29/24  Celestia Rosaline SQUIBB, NP  ibuprofen  (ADVIL ) 200 MG tablet Take 200 mg by mouth as needed.    [provider]  levocetirizine (XYZAL  ALLERGY 24HR) 5 MG tablet Take 1 tablet (5 mg total) by mouth every evening. 09/01/23   Celestia Rosaline SQUIBB, NP   lidocaine  (LIDODERM ) 5 % Place 1 patch onto the skin daily. Remove & Discard patch within 12 hours or as directed by MD 08/20/23   Johnie Rumaldo LABOR, NP  metFORMIN  (GLUCOPHAGE ) 500 MG tablet Take 0.5 tablets (250 mg total) by mouth 2 (two) times daily. 06/12/23   Celestia Rosaline SQUIBB, NP  sertraline  (ZOLOFT ) 50 MG tablet Take 3 tablets (150 mg total) by mouth every morning. 02/21/23   Celestia Rosaline SQUIBB, NP    Family History Family History  Problem Relation Age of Onset   Hypertension Mother    Arthritis Sister    Anesthesia problems Neg Hx    Hypotension Neg Hx  Malignant hyperthermia Neg Hx    Pseudochol deficiency Neg Hx    Neuropathy Neg Hx     Social History Social History   Tobacco Use   Smoking status: Every Day    Current packs/day: 1.00    Types: Cigarettes    Passive exposure: Current   Smokeless tobacco: Never  Vaping Use   Vaping status: Never Used  Substance Use Topics   Alcohol  use: Not Currently    Comment: pt states no beer for 1 year and seven months   Drug use: No     Allergies   Patient has no known allergies.   Review of Systems Review of Systems  Eyes:  Negative for visual disturbance.  Respiratory:  Negative for shortness of breath.   Cardiovascular:  Positive for chest pain. Negative for palpitations and leg swelling.  Musculoskeletal:  Positive for back pain and neck pain.       Left side of neck & left shoulder  Neurological:  Positive for numbness (intermittent bilateral hand numbness). Negative for dizziness, weakness and headaches.  All other systems reviewed and are negative.    Physical Exam Triage Vital Signs ED Triage Vitals  Encounter Vitals Group     BP 09/18/23 1418 109/75     Girls Systolic BP Percentile --      Girls Diastolic BP Percentile --      Boys Systolic BP Percentile --      Boys Diastolic BP Percentile --      Pulse Rate 09/18/23 1418 79     Resp 09/18/23 1418 18     Temp 09/18/23 1418 98.2 F (36.8 C)      Temp Source 09/18/23 1418 Oral     SpO2 09/18/23 1418 97 %     Weight 09/18/23 1417 233 lb 7.5 oz (105.9 kg)     Height --      Head Circumference --      Peak Flow --      Pain Score 09/18/23 1415 10     Pain Loc --      Pain Education --      Exclude from Growth Chart --    No data found.  Updated Vital Signs BP 109/75 (BP Location: Left Arm)   Pulse 79   Temp 98.2 F (36.8 C) (Oral)   Resp 18   Wt 233 lb 7.5 oz (105.9 kg)   LMP 09/01/2023 (Approximate)   SpO2 97%   BMI 42.70 kg/m   Visual Acuity Right Eye Distance:   Left Eye Distance:   Bilateral Distance:    Right Eye Near:   Left Eye Near:    Bilateral Near:     Physical Exam Vitals reviewed.  Constitutional:      General: She is awake. She is not in acute distress.    Appearance: Normal appearance. She is well-developed. She is not ill-appearing, toxic-appearing or diaphoretic.     Comments: Uncomfortable due to pain   HENT:     Head: Normocephalic.     Right Ear: Hearing normal.     Left Ear: Hearing normal.     Nose: Nose normal.     Mouth/Throat:     Mouth: Mucous membranes are moist.  Eyes:     General: Vision grossly intact.     Conjunctiva/sclera: Conjunctivae normal.  Neck:   Cardiovascular:     Rate and Rhythm: Normal rate and regular rhythm.     Heart sounds: Normal heart sounds.  Pulmonary:  Effort: Pulmonary effort is normal.     Breath sounds: Normal breath sounds and air entry.  Chest:    Abdominal:     Palpations: Abdomen is soft.  Musculoskeletal:     Left shoulder: Tenderness present. No swelling, deformity, effusion, laceration, bony tenderness or crepitus. Decreased range of motion. Normal strength.     Right hand: Normal strength. Normal sensation. Normal capillary refill.     Left hand: Normal strength. Normal sensation. Normal capillary refill.     Cervical back: Neck supple. No signs of trauma, rigidity, torticollis or crepitus. Pain with movement and muscular  tenderness present. No spinous process tenderness. Decreased range of motion (due to pain).       Back:     Right lower leg: No edema.     Left lower leg: No edema.  Skin:    General: Skin is warm and dry.  Neurological:     General: No focal deficit present.     Mental Status: She is alert and oriented to person, place, and time.  Psychiatric:        Speech: Speech normal.        Behavior: Behavior is cooperative.      UC Treatments / Results  Labs (all labs ordered are listed, but only abnormal results are displayed) Labs Reviewed - No data to display  EKG   Radiology DG Shoulder Left Result Date: 09/18/2023 CLINICAL DATA:  Three day history of left shoulder pain after a loading truck at work EXAM: LEFT SHOULDER - 3 VIEW COMPARISON:  Left shoulder radiograph dated 07/04/2019 FINDINGS: There is no evidence of fracture or dislocation. There is no evidence of arthropathy or other focal bone abnormality. Soft tissues are unremarkable. IMPRESSION: No acute fracture or dislocation. Electronically Signed   By: Limin  Xu M.D.   On: 09/18/2023 14:53    Procedures Procedures (including critical care time)  Medications Ordered in UC Medications  ketorolac  (TORADOL ) 30 MG/ML injection 60 mg (has no administration in time range)  dexamethasone  (DECADRON ) injection 10 mg (10 mg Intramuscular Given 09/18/23 1548)    Initial Impression / Assessment and Plan / UC Course  I have reviewed the triage vital signs and the nursing notes.  Pertinent labs & imaging results that were available during my care of the patient were reviewed by me and considered in my medical decision making (see chart for details).  Patient presents with acute pain involving the shoulder, chest, and back that began three days ago while unloading a truck. Pain radiates across the chest, back, and shoulder, with associated shortness of breath upon movement due to pain severity. Shoulder X-ray was normal. Ibuprofen  has not  provided relief. Based on the mechanism of injury and clinical findings, the presentation is most consistent with a severe muscle strain with possible nerve impingement. Steroid and Toradol  injections were administered in clinic for immediate relief. The patient was prescribed Robaxin  for daytime use and Flexeril  at bedtime for muscle relaxation, along with Naproxen  twice daily for inflammation. Patient was instructed to avoid all other over-the-counter NSAIDs while on Naproxen , but may use Tylenol  if additional pain relief is needed. Advised to avoid heavy lifting or pulling for the next few days.  The patient also reports bilateral hand numbness, especially after holding objects and during the night, with relief when arms are lowered. These symptoms predate the recent injury and are consistent with carpal tunnel syndrome, previously diagnosed. Wrist splints were recommended, particularly for nighttime use, and the patient was  advised to follow up with orthopedics for further evaluation and long-term management. Return to the ED if symptoms worsen, new weakness develops, or pain becomes severe or unmanageable.  Today's evaluation has revealed no signs of a dangerous process. Discussed diagnosis with patient and/or guardian. Patient and/or guardian aware of their diagnosis, possible red flag symptoms to watch out for and need for close follow up. Patient and/or guardian understands verbal and written discharge instructions. Patient and/or guardian comfortable with plan and disposition.  Patient and/or guardian has a clear mental status at this time, good insight into illness (after discussion and teaching) and has clear judgment to make decisions regarding their care  Documentation was completed with the aid of voice recognition software. Transcription may contain typographical errors.  Final Clinical Impressions(s) / UC Diagnoses   Final diagnoses:  Acute pain of left shoulder  Carpal tunnel syndrome,  bilateral upper limbs  Strain of unspecified muscle, fascia and tendon at shoulder and upper arm level, left arm, initial encounter  Strain of muscle, fascia and tendon at neck level, initial encounter     Discharge Instructions      You were seen today for shoulder, chest, and back pain that began after unloading a truck three days ago. Your X-ray was normal, and the pain is likely due to a severe muscle strain with possible nerve impingement. You were given a steroid injection and a Toradol  injection in the clinic to help reduce inflammation and pain. You were prescribed Robaxin  to take in the morning and Flexeril  at bedtime to help relax the muscles. Naproxen  was also prescribed to reduce inflammation and should be taken twice a day. Do not take any other over-the-counter NSAIDs like ibuprofen  or Aleve  while on Naproxen . Tylenol  may be used if additional pain relief is needed. Avoid heavy lifting or pulling for the next few days, and rest the affected area as much as possible.  You also reported numbness in both hands, especially after holding objects and during sleep, which improves when you lower your arms. These symptoms are consistent with carpal tunnel syndrome. Wearing wrist splints, especially at night, can help relieve symptoms. These can be purchased online or at a medical supply store.   Follow up with orthopedics for further evaluation and management.  Go to the emergency department if you experience worsening pain, new weakness, loss of coordination, chest pain unrelated to movement, shortness of breath at rest, or numbness that spreads or becomes severe.      ED Prescriptions     Medication Sig Dispense Auth. Provider   methocarbamol  (ROBAXIN ) 500 MG tablet Take 1 tablet (500 mg total) by mouth every morning. 10 tablet Iola Lukes, FNP   cyclobenzaprine  (FLEXERIL ) 10 MG tablet Take 1 tablet (10 mg total) by mouth at bedtime. 10 tablet Iola Lukes, FNP   naproxen   (NAPROSYN ) 500 MG tablet Take 1 tablet (500 mg total) by mouth 2 (two) times daily with a meal. Take with food to avoid stomach upset. Do not take any additional NSAIDs while on this. You may take tylenol  in addition to this if needed for extra pain relief. 20 tablet Iola Lukes, FNP      PDMP not reviewed this encounter.   Iola Lukes, [place] 09/18/23 [phone]

## 2023-09-18 NOTE — Discharge Instructions (Addendum)
 You were seen today for shoulder, chest, and back pain that began after unloading a truck three days ago. Your X-ray was normal, and the pain is likely due to a severe muscle strain with possible nerve impingement. You were given a steroid injection and a Toradol  injection in the clinic to help reduce inflammation and pain. You were prescribed Robaxin  to take in the morning and Flexeril  at bedtime to help relax the muscles. Naproxen  was also prescribed to reduce inflammation and should be taken twice a day. Do not take any other over-the-counter NSAIDs like ibuprofen  or Aleve  while on Naproxen . Tylenol  may be used if additional pain relief is needed. Avoid heavy lifting or pulling for the next few days, and rest the affected area as much as possible.  You also reported numbness in both hands, especially after holding objects and during sleep, which improves when you lower your arms. These symptoms are consistent with carpal tunnel syndrome. Wearing wrist splints, especially at night, can help relieve symptoms. These can be purchased online or at a medical supply store.   Follow up with orthopedics for further evaluation and management.  Go to the emergency department if you experience worsening pain, new weakness, loss of coordination, chest pain unrelated to movement, shortness of breath at rest, or numbness that spreads or becomes severe.

## 2023-10-18 ENCOUNTER — Other Ambulatory Visit (INDEPENDENT_AMBULATORY_CARE_PROVIDER_SITE_OTHER): Payer: Self-pay | Admitting: Primary Care

## 2023-10-18 ENCOUNTER — Other Ambulatory Visit: Payer: Self-pay

## 2023-10-18 ENCOUNTER — Other Ambulatory Visit: Payer: Self-pay | Admitting: Podiatry

## 2023-10-18 DIAGNOSIS — I10 Essential (primary) hypertension: Secondary | ICD-10-CM

## 2023-10-20 ENCOUNTER — Other Ambulatory Visit: Payer: Self-pay

## 2023-10-20 ENCOUNTER — Telehealth: Payer: Self-pay | Admitting: Podiatry

## 2023-10-20 MED ORDER — HYDROCHLOROTHIAZIDE 25 MG PO TABS
ORAL_TABLET | Freq: Every day | ORAL | 0 refills | Status: DC
  Filled 2023-10-20: qty 90, 90d supply, fill #0

## 2023-10-20 MED ORDER — ESOMEPRAZOLE MAGNESIUM 40 MG PO CPDR
40.0000 mg | DELAYED_RELEASE_CAPSULE | Freq: Every day | ORAL | 0 refills | Status: DC
  Filled 2023-10-20: qty 90, 90d supply, fill #0

## 2023-10-20 NOTE — Telephone Encounter (Signed)
 Requested Prescriptions  Pending Prescriptions Disp Refills   esomeprazole  (NEXIUM ) 40 MG capsule 90 capsule 0    Sig: Take 1 capsule (40 mg total) by mouth daily.     Gastroenterology: Proton Pump Inhibitors 2 Passed - 10/20/2023 11:46 AM      Passed - ALT in normal range and within 360 days    ALT  Date Value Ref Range Status  11/16/2022 14 0 - 44 U/L Final         Passed - AST in normal range and within 360 days    AST  Date Value Ref Range Status  11/16/2022 16 15 - 41 U/L Final         Passed - Valid encounter within last 12 months    Recent Outpatient Visits           8 months ago Colon cancer screening   [place] [facility] Celestia Rosaline SQUIBB, NP   11 months ago Tendinitis of both feet   [place] [facility] Celestia Rosaline SQUIBB, NP   11 months ago Medication refill   [place] [facility] Celestia Rosaline SQUIBB, NP   1 year ago Hyperglycemia   [place] [facility] Celestia Rosaline SQUIBB, NP   2 years ago Current moderate episode of major depressive disorder, unspecified whether recurrent (HCC)   [place] [facility] Celestia Rosaline SQUIBB, NP               hydrochlorothiazide  (HYDRODIURIL ) 25 MG tablet 90 tablet 0    Sig: TAKE 1 TABLET (25 MG TOTAL) BY MOUTH DAILY.     Cardiovascular: Diuretics - Thiazide Failed - 10/20/2023 11:46 AM      Failed - Cr in normal range and within 180 days    Creatinine, Ser  Date Value Ref Range Status  11/16/2022 0.61 0.44 - 1.00 mg/dL Final         Failed - K in normal range and within 180 days    Potassium  Date Value Ref Range Status  11/16/2022 4.1 3.5 - 5.1 mmol/L Final         Failed - Na in normal range and within 180 days    Sodium  Date Value Ref Range Status  11/16/2022 136 135 - 145 mmol/L Final  04/04/2019 143 134 - 144 mmol/L Final         Failed - Valid encounter within last 6 months    Recent Outpatient Visits            8 months ago Colon cancer screening   [place] [facility] Celestia Rosaline SQUIBB, NP   11 months ago Tendinitis of both feet   [place] [facility] Celestia Rosaline SQUIBB, NP   11 months ago Medication refill   [place] [facility] Celestia Rosaline SQUIBB, NP   1 year ago Hyperglycemia   [place] [facility] Celestia Rosaline SQUIBB, NP   2 years ago Current moderate episode of major depressive disorder, unspecified whether recurrent [facility])   [place] [facility] Celestia Rosaline SQUIBB, NP              Passed - Last BP in normal range    BP Readings from Last 1 Encounters:  09/18/23 109/75

## 2023-10-20 NOTE — Telephone Encounter (Signed)
 Called and left message for patient to confirm appointment cancellation.

## 2023-10-23 ENCOUNTER — Other Ambulatory Visit: Payer: Self-pay

## 2023-10-24 NOTE — Telephone Encounter (Signed)
 Called to confirm appointment cancellation. No answer so left message. Notified patient I would wait until end of day today and If no response on patient side would go ahead and cancel.

## 2023-10-26 ENCOUNTER — Ambulatory Visit: Admitting: Podiatry

## 2023-10-28 ENCOUNTER — Emergency Department (HOSPITAL_COMMUNITY)
Admission: EM | Admit: 2023-10-28 | Discharge: 2023-10-29 | Disposition: A | Discharge status: Home / Self Care | Attending: Emergency Medicine | Admitting: Emergency Medicine

## 2023-10-28 DIAGNOSIS — N390 Urinary tract infection, site not specified: Secondary | ICD-10-CM | POA: Insufficient documentation

## 2023-10-28 DIAGNOSIS — J449 Chronic obstructive pulmonary disease, unspecified: Secondary | ICD-10-CM | POA: Diagnosis not present

## 2023-10-28 DIAGNOSIS — R1084 Generalized abdominal pain: Secondary | ICD-10-CM | POA: Diagnosis present

## 2023-10-28 DIAGNOSIS — Z7951 Long term (current) use of inhaled steroids: Secondary | ICD-10-CM | POA: Diagnosis not present

## 2023-10-28 DIAGNOSIS — I10 Essential (primary) hypertension: Secondary | ICD-10-CM | POA: Diagnosis not present

## 2023-10-28 DIAGNOSIS — D649 Anemia, unspecified: Secondary | ICD-10-CM | POA: Diagnosis not present

## 2023-10-28 DIAGNOSIS — E876 Hypokalemia: Secondary | ICD-10-CM | POA: Diagnosis not present

## 2023-10-28 DIAGNOSIS — A5901 Trichomonal vulvovaginitis: Secondary | ICD-10-CM | POA: Insufficient documentation

## 2023-10-29 ENCOUNTER — Other Ambulatory Visit: Payer: Self-pay

## 2023-10-29 ENCOUNTER — Encounter (HOSPITAL_COMMUNITY): Payer: Self-pay | Admitting: *Deleted

## 2023-10-29 ENCOUNTER — Emergency Department (HOSPITAL_COMMUNITY)

## 2023-10-29 LAB — URINALYSIS, ROUTINE W REFLEX MICROSCOPIC
Bilirubin Urine: NEGATIVE
Glucose, UA: NEGATIVE mg/dL
Ketones, ur: NEGATIVE mg/dL
Nitrite: NEGATIVE
Protein, ur: NEGATIVE mg/dL
Specific Gravity, Urine: 1.012 (ref 1.005–1.030)
pH: 6 (ref 5.0–8.0)

## 2023-10-29 LAB — RESP PANEL BY RT-PCR (RSV, FLU A&B, COVID)  RVPGX2
Influenza A by PCR: NEGATIVE
Influenza B by PCR: NEGATIVE
Resp Syncytial Virus by PCR: NEGATIVE
SARS Coronavirus 2 by RT PCR: NEGATIVE

## 2023-10-29 LAB — CBC
HCT: 36.5 % (ref 36.0–46.0)
Hemoglobin: 10.9 g/dL — ABNORMAL LOW (ref 12.0–15.0)
MCH: 23.4 pg — ABNORMAL LOW (ref 26.0–34.0)
MCHC: 29.9 g/dL — ABNORMAL LOW (ref 30.0–36.0)
MCV: 78.5 fL — ABNORMAL LOW (ref 80.0–100.0)
Platelets: 356 K/uL (ref 150–400)
RBC: 4.65 MIL/uL (ref 3.87–5.11)
RDW: 20.8 % — ABNORMAL HIGH (ref 11.5–15.5)
WBC: 9.3 K/uL (ref 4.0–10.5)
nRBC: 0 % (ref 0.0–0.2)

## 2023-10-29 LAB — COMPREHENSIVE METABOLIC PANEL WITH GFR
ALT: 15 U/L (ref 0–44)
AST: 20 U/L (ref 15–41)
Albumin: 3.9 g/dL (ref 3.5–5.0)
Alkaline Phosphatase: 124 U/L (ref 38–126)
Anion gap: 13 (ref 5–15)
BUN: 8 mg/dL (ref 6–20)
CO2: 24 mmol/L (ref 22–32)
Calcium: 9.2 mg/dL (ref 8.9–10.3)
Chloride: 99 mmol/L (ref 98–111)
Creatinine, Ser: 0.7 mg/dL (ref 0.44–1.00)
GFR, Estimated: >60 mL/min (ref >60)
Glucose, Bld: 106 mg/dL — ABNORMAL HIGH (ref 70–99)
Potassium: 3.2 mmol/L — ABNORMAL LOW (ref 3.5–5.1)
Sodium: 136 mmol/L (ref 135–145)
Total Bilirubin: 0.3 mg/dL (ref 0.0–1.2)
Total Protein: 6.9 g/dL (ref 6.5–8.1)

## 2023-10-29 LAB — HCG, SERUM, QUALITATIVE: Preg, Serum: NEGATIVE

## 2023-10-29 LAB — LIPASE, BLOOD: Lipase: 35 U/L (ref 11–51)

## 2023-10-29 MED ORDER — SODIUM CHLORIDE 0.9 % IV BOLUS
1000.0000 mL | Freq: Once | INTRAVENOUS | Status: AC
  Administered 2023-10-29: 1000 mL via INTRAVENOUS

## 2023-10-29 MED ORDER — METRONIDAZOLE 500 MG PO TABS
500.0000 mg | ORAL_TABLET | Freq: Two times a day (BID) | ORAL | 0 refills | Status: AC
Start: 2023-10-29 — End: 2023-11-05

## 2023-10-29 MED ORDER — CEPHALEXIN 500 MG PO CAPS: 500.0000 mg | ORAL_CAPSULE | Freq: Two times a day (BID) | ORAL | 0 refills | Status: AC

## 2023-10-29 MED ORDER — IOHEXOL 350 MG/ML SOLN
75.0000 mL | Freq: Once | INTRAVENOUS | Status: AC | PRN
  Administered 2023-10-29: 75 mL via INTRAVENOUS

## 2023-10-29 NOTE — ED Triage Notes (Signed)
 The pt has body aches  abd pain  urinary frequency for 3-4 days lmp now

## 2023-10-29 NOTE — Discharge Instructions (Signed)
 Please take your results primary care provider for recheck. Return to the emergency room for any worsening or concerning symptoms. Take antibiotics as prescribed as discussed. Follow-up in your MyChart account for your remaining lab results.

## 2023-10-29 NOTE — ED Provider Notes (Signed)
 [place] EMERGENCY DEPARTMENT AT [facility] Provider Note   CSN: [id number] Arrival date & time: 10/28/23  2355     Patient presents with: Generalized Body Aches   Gina Shelton is a 48 y.o. female.   48 year old female presents with complaint of generalized abdominal pain and pain in the right side of her back for the past few days associated with bodyaches, urinary frequency without dysuria.  Denies nausea or vomiting.  Patient holding her right upper quadrant area.  Denies fevers, chills, changes in bowel habits.  Reports prior C-section with dehiscence with complex course healing about 22 years ago.       Prior to Admission medications   Medication Sig Start Date End Date Taking? Authorizing Provider  cephALEXin  (KEFLEX ) 500 MG capsule Take 1 capsule (500 mg total) by mouth 2 (two) times daily for 5 days. 10/29/23 11/03/23 Yes Beverley Leita LABOR, PA-C  metroNIDAZOLE  (FLAGYL ) 500 MG tablet Take 1 tablet (500 mg total) by mouth 2 (two) times daily for 7 days. 10/29/23 11/05/23 Yes Beverley Leita LABOR, PA-C  Acetaminophen  (TYLENOL  PO) Take 500 mg by mouth as needed.    [provider]  albuterol  (VENTOLIN  HFA) 108 (90 Base) MCG/ACT inhaler Inhale 2 puffs into the lungs every 4 (four) hours as needed for wheezing or shortness of breath. 01/29/23   Celestia Rosaline SQUIBB, NP  Alpha-Lipoic Acid 600 MG CAPS Take 1 capsule (600 mg total) by mouth daily. Can increase to twice daily if no side effects 02/26/23   Ines Onetha NOVAK, MD  Blood Pressure Monitor KIT 1 kit by Does not apply route 3 (three) times daily as needed. 04/04/19   Celestia Rosaline SQUIBB, NP  cholecalciferol (VITAMIN D3) 25 MCG (1000 UNIT) tablet Take 1 tablet (1,000 Units total) by mouth every morning. 09/26/22   Celestia Rosaline SQUIBB, NP  colchicine  0.6 MG tablet Take 1 tablet (0.6 mg total) by mouth daily as needed (gout pain). 10/27/22   Celestia Rosaline SQUIBB, NP  cyclobenzaprine  (FLEXERIL ) 10 MG tablet Take 1 tablet  (10 mg total) by mouth at bedtime. 09/18/23   Iola Lukes, FNP  esomeprazole  (NEXIUM ) 40 MG capsule Take 1 capsule (40 mg total) by mouth daily. 10/20/23   Celestia Rosaline SQUIBB, NP  fluticasone  (FLONASE ) 50 MCG/ACT nasal spray Place 2 sprays into both nostrils daily. 10/27/22   Celestia Rosaline SQUIBB, NP  gabapentin  (NEURONTIN ) 300 MG capsule Take 1 capsule (300 mg total) by mouth 2 (two) times daily. 12/02/22   Standiford, Marsa JULIANNA, DPM  gabapentin  (NEURONTIN ) 300 MG capsule Take 1 capsule (300 mg total) by mouth 3 (three) times daily. 09/01/23   Standiford, Marsa JULIANNA, DPM  hydrochlorothiazide  (HYDRODIURIL ) 25 MG tablet TAKE 1 TABLET (25 MG TOTAL) BY MOUTH DAILY. 10/20/23 10/19/24  Celestia Rosaline SQUIBB, NP  ibuprofen  (ADVIL ) 200 MG tablet Take 200 mg by mouth as needed.    [provider]  levocetirizine (XYZAL  ALLERGY 24HR) 5 MG tablet Take 1 tablet (5 mg total) by mouth every evening. 09/01/23   Celestia Rosaline SQUIBB, NP  lidocaine  (LIDODERM ) 5 % Place 1 patch onto the skin daily. Remove & Discard patch within 12 hours or as directed by MD 08/20/23   Johnie Rumaldo LABOR, NP  metFORMIN  (GLUCOPHAGE ) 500 MG tablet Take 0.5 tablets (250 mg total) by mouth 2 (two) times daily. 06/12/23   Celestia Rosaline SQUIBB, NP  methocarbamol  (ROBAXIN ) 500 MG tablet Take 1 tablet (500 mg total) by mouth every morning. 09/18/23  Iola Lukes, FNP  naproxen  (NAPROSYN ) 500 MG tablet Take 1 tablet (500 mg total) by mouth 2 (two) times daily with a meal. Take with food to avoid stomach upset. Do not take any additional NSAIDs while on this. You may take tylenol  in addition to this if needed for extra pain relief. 09/18/23   Murrill, Samantha, FNP  sertraline  (ZOLOFT ) 50 MG tablet Take 3 tablets (150 mg total) by mouth every morning. 02/21/23   Celestia Rosaline SQUIBB, NP    Allergies: Patient has no known allergies.    Review of Systems Negative except as per HPI Updated Vital Signs BP (!) 139/90 (BP Location: Right Arm)    Pulse 78   Temp 97.6 F (36.4 C) (Oral)   Resp 17   Ht 5' 2 (1.575 m)   Wt 105.9 kg   LMP 10/29/2023   SpO2 100%   BMI 42.70 kg/m   Physical Exam Vitals and nursing note reviewed.  Constitutional:      General: She is not in acute distress.    Appearance: She is well-developed. She is not diaphoretic.  HENT:     Head: Normocephalic and atraumatic.  Cardiovascular:     Pulses: Normal pulses.  Pulmonary:     Effort: Pulmonary effort is normal.  Abdominal:     General: A surgical scar is present.     Palpations: Abdomen is soft.     Tenderness: There is abdominal tenderness. There is no right CVA tenderness, left CVA tenderness or guarding.     Comments: Well healed lower abdominal midline incision   Musculoskeletal:     Right lower leg: No edema.     Left lower leg: No edema.  Skin:    General: Skin is warm and dry.     Findings: No erythema or rash.  Neurological:     Mental Status: She is alert and oriented to person, place, and time.  Psychiatric:        Behavior: Behavior normal.     (all labs ordered are listed, but only abnormal results are displayed) Labs Reviewed  COMPREHENSIVE METABOLIC PANEL WITH GFR - Abnormal; Notable for the following components:      Result Value   Potassium 3.2 (*)    Glucose, Bld 106 (*)    All other components within normal limits  CBC - Abnormal; Notable for the following components:   Hemoglobin 10.9 (*)    MCV 78.5 (*)    MCH 23.4 (*)    MCHC 29.9 (*)    RDW 20.8 (*)    All other components within normal limits  URINALYSIS, ROUTINE W REFLEX MICROSCOPIC - Abnormal; Notable for the following components:   Hgb urine dipstick SMALL (*)    Leukocytes,Ua LARGE (*)    Bacteria, UA RARE (*)    Trichomonas, UA PRESENT (*)    All other components within normal limits  RESP PANEL BY RT-PCR (RSV, FLU A&B, COVID)  RVPGX2  LIPASE, BLOOD  HCG, SERUM, QUALITATIVE  GC/CHLAMYDIA PROBE AMP ([place]) NOT AT [facility]     EKG: None  Radiology: CT ABDOMEN PELVIS W CONTRAST Result Date: 10/29/2023 EXAM: CT ABDOMEN AND PELVIS WITH CONTRAST 10/29/2023 06:13:15 AM TECHNIQUE: CT of the abdomen and pelvis was performed with the administration of intravenous contrast. Multiplanar reformatted images are provided for review. Automated exposure control, iterative reconstruction, and/or weight based adjustment of the mA/kV was utilized to reduce the radiation dose to as low as reasonably achievable. COMPARISON: CT abdomen and pelvis  with contrast 07/14/2021 CLINICAL HISTORY: Abdominal pain, acute, nonlocalized. Epigastric pain and nausea after eating or drinking. FINDINGS: LOWER CHEST: No acute abnormality. LIVER: Hemangioma in the posterior right lobe of the liver is slightly smaller than on prior exams, measuring 5.3 x 4.6 cm. GALLBLADDER AND BILE DUCTS: Gallbladder is unremarkable. No biliary ductal dilatation. SPLEEN: No acute abnormality. PANCREAS: No acute abnormality. ADRENAL GLANDS: No acute abnormality. KIDNEYS, URETERS AND BLADDER: No stones in the kidneys or ureters. No hydronephrosis. No perinephric or periureteral stranding. Urinary bladder is unremarkable. GI AND BOWEL: Stomach demonstrates no acute abnormality. There is no bowel obstruction. No bowel wall thickening. PERITONEUM AND RETROPERITONEUM: No ascites. No free air. VASCULATURE: Atherosclerotic calcifications in the distal abdominal aorta and right greater than left iliac arteries are similar to the prior exam. LYMPH NODES: No lymphadenopathy. REPRODUCTIVE ORGANS: No acute abnormality. BONES AND SOFT TISSUES: No acute osseous abnormality. No focal soft tissue abnormality. IMPRESSION: 1. No acute findings in the abdomen or pelvis. 2. Hemangioma in the posterior right lobe of the liver, slightly smaller than on prior exams, measuring 5.3 x 4.6 cm. 3. Atherosclerotic calcifications in the distal abdominal aorta and right greater than left iliac arteries, similar to  the prior exam. Electronically signed by: Lonni Necessary MD 10/29/2023 06:33 AM EDT RP Workstation: [id number]     Procedures   Medications Ordered in the ED  sodium chloride  0.9 % bolus 1,000 mL (1,000 mLs Intravenous New Bag/Given 10/29/23 0642)  iohexol  (OMNIPAQUE ) 350 MG/ML injection 75 mL (75 mLs Intravenous Contrast Given 10/29/23 0612)                                    Medical Decision Making Amount and/or Complexity of Data Reviewed Radiology: ordered.  Risk Prescription drug management.   This patient presents to the ED for concern of abdominal pain, this involves an extensive number of treatment options, and is a complaint that carries with it a high risk of complications and morbidity.  The differential diagnosis includes SBO, acute cholecystitis, appendicitis, UTI, pancreatitis, viral illness   Co morbidities / Chronic conditions that complicate the patient evaluation  Substance abuse, HTN, gout, GERD, COPD, migraine    Additional history obtained:  Additional history obtained from EMR External records from outside source obtained and reviewed including prior labs and imaging on file   Lab Tests:  I Ordered, and personally interpreted labs.  The pertinent results include: CBC with mild anemia at 10.9, unchanged from prior.  CMP with mild hypokalemia at 3.2.  Lipase normal.  hCG negative.  Urinalysis is positive for leukocytes with rare bacteria, 11-20 white cells and trichomoniasis.  Viral swab negative for COVID, flu, RSV Send out GC/chlamydia patient aware to follow up in mychart for results    Imaging Studies ordered:  I ordered imaging studies including CT abdomen pelvis   Imaging and read pending at time of signout  Problem List / ED Course / Critical interventions / Medication management  48 year old female presents with complaint of generalized abdominal pain with pain in her right back.  Also reported urinary frequency.  Denies vaginal  discharge.  States that she was incarcerated, had intercourse afterwards and questions if this is where she has encountered trichomoniasis, will send out gonorrhea and chlamydia.  hCG is negative.  Lipase normal.  CMP with mild hypokalemia potassium 3.2.  CBC with stable anemia with hemoglobin of 10.9.  CT with known  hemangioma.  Patient is provided with copies of her workup today.  Follow-up with PCP this week, return to ER for worsening recurrent symptoms.  Urinary tract infection covered with Keflex , trichomoniasis treated with Flagyl .  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 I ordered medication including IV fluids Reevaluation of the patient after these medicines showed that the patient stable and ready for discharge I have reviewed the patients home medicines and have made adjustments as needed   Social Determinants of Health:  Has PCP   Test / Admission - Considered:  Stable for discharge      Final diagnoses:  Generalized abdominal pain  Vaginal trichomoniasis  Urinary tract infection in female    ED Discharge Orders          Ordered    metroNIDAZOLE  (FLAGYL ) 500 MG tablet  2 times daily        10/29/23 0524    cephALEXin  (KEFLEX ) 500 MG capsule  2 times daily        10/29/23 0526               Beverley Leita LABOR, PA-C 10/29/23 0657    Palumbo, April, MD 10/29/23 [phone]

## 2023-10-30 LAB — GC/CHLAMYDIA PROBE AMP (~~LOC~~) NOT AT ARMC
Chlamydia: NEGATIVE
Comment: NEGATIVE
Comment: NORMAL
Neisseria Gonorrhea: NEGATIVE

## 2023-11-06 ENCOUNTER — Ambulatory Visit: Payer: Self-pay

## 2023-11-06 NOTE — Telephone Encounter (Signed)
 Left message on voicemail to return call.  Give options for [facility] and location today.  [facility] [address]   Next appointment availability is 11/23/2023.

## 2023-11-06 NOTE — Telephone Encounter (Signed)
 FYI Only or Action Required?: FYI only for provider.  Patient was last seen in primary care on 02/21/2023 by Celestia Rosaline SQUIBB, NP.  Called Nurse Triage reporting Numbness.  Symptoms began several weeks ago.  Interventions attempted: Nothing.  Symptoms are: stable.  Triage Disposition: See PCP When Office is Open (Within 3 Days)  Patient/caregiver understands and will follow disposition?: Yes HFU already scheduled for 9/11. Pt agreeable to [facility] to address hand tingling and numbness  Copied from CRM #[id number]. Topic: Clinical - Red Word Triage >> Nov 06, 2023  8:43 AM Tiffini S wrote: Red Word that prompted transfer to Nurse Triage: Patient is having numbness and losing feeling in her hands and arms - stated a few months ago. Transferred to call triage nurse. Reason for Disposition  [1] Numbness or tingling in one or both hands AND [2] is a chronic symptom (recurrent or ongoing AND present > 4 weeks)  Answer Assessment - Initial Assessment Questions 1. SYMPTOM: What is the main symptom you are concerned about? (e.g., weakness, numbness)      I keep losing feeling in my hands and arms sometimes if feels numb when I bend my arm   2. ONSET: When did this start? (e.g., minutes, hours, days; while sleeping)     Months ago  3. LAST NORMAL: When was the last time you (the patient) were normal (no symptoms)?     Unk  4. PATTERN Does this come and go, or has it been constant since it started?  Is it present now?     Comes and goes, diagnosed with carpel tunnel on 09/18/23  5. CARDIAC SYMPTOMS: Have you had any of the following symptoms: chest pain, difficulty breathing, palpitations?     Off/on chest wall pain for 3-4 weeks  6. NEUROLOGIC SYMPTOMS: Have you had any of the following symptoms: headache, dizziness, vision loss, double vision, changes in speech, unsteady on your feet?     Daily headaches a couple of weeks  7. OTHER  SYMPTOMS: Do you have any other symptoms?     Gets light headed   8. PREGNANCY: Is there any chance you are pregnant? When was your last menstrual period?     no  Protocols used: Neurologic Deficit-A-AH

## 2023-11-23 ENCOUNTER — Other Ambulatory Visit: Payer: Self-pay

## 2023-11-23 ENCOUNTER — Encounter (INDEPENDENT_AMBULATORY_CARE_PROVIDER_SITE_OTHER): Payer: Self-pay | Admitting: Primary Care

## 2023-11-23 ENCOUNTER — Ambulatory Visit (INDEPENDENT_AMBULATORY_CARE_PROVIDER_SITE_OTHER): Admitting: Primary Care

## 2023-11-23 VITALS — BP 116/79 | HR 80 | Temp 98.5°F | Resp 16 | Ht 64.0 in | Wt 197.0 lb

## 2023-11-23 DIAGNOSIS — E66811 Obesity, class 1: Secondary | ICD-10-CM | POA: Diagnosis not present

## 2023-11-23 DIAGNOSIS — H10503 Unspecified blepharoconjunctivitis, bilateral: Secondary | ICD-10-CM

## 2023-11-23 DIAGNOSIS — R739 Hyperglycemia, unspecified: Secondary | ICD-10-CM | POA: Diagnosis not present

## 2023-11-23 DIAGNOSIS — M545 Low back pain, unspecified: Secondary | ICD-10-CM

## 2023-11-23 DIAGNOSIS — J418 Mixed simple and mucopurulent chronic bronchitis: Secondary | ICD-10-CM

## 2023-11-23 DIAGNOSIS — Z1211 Encounter for screening for malignant neoplasm of colon: Secondary | ICD-10-CM

## 2023-11-23 DIAGNOSIS — I10 Essential (primary) hypertension: Secondary | ICD-10-CM

## 2023-11-23 DIAGNOSIS — Z1231 Encounter for screening mammogram for malignant neoplasm of breast: Secondary | ICD-10-CM

## 2023-11-23 LAB — POCT URINALYSIS DIP (CLINITEK)
Bilirubin, UA: NEGATIVE
Blood, UA: NEGATIVE
Glucose, UA: NEGATIVE mg/dL
Ketones, POC UA: NEGATIVE mg/dL
Leukocytes, UA: NEGATIVE
Nitrite, UA: NEGATIVE
POC PROTEIN,UA: NEGATIVE
Spec Grav, UA: 1.02 (ref 1.010–1.025)
Urobilinogen, UA: 1 U/dL
pH, UA: 7.5 (ref 5.0–8.0)

## 2023-11-23 LAB — POCT GLYCOSYLATED HEMOGLOBIN (HGB A1C): HbA1c, POC (prediabetic range): 5.9 % (ref 5.7–6.4)

## 2023-11-23 MED ORDER — HYDROCHLOROTHIAZIDE 25 MG PO TABS
25.0000 mg | ORAL_TABLET | Freq: Every day | ORAL | 1 refills | Status: DC
  Filled 2023-11-23: qty 90, fill #0
  Filled 2024-02-09: qty 90, 90d supply, fill #0

## 2023-11-23 MED ORDER — SERTRALINE HCL 50 MG PO TABS
150.0000 mg | ORAL_TABLET | Freq: Every morning | ORAL | 2 refills | Status: DC
  Filled 2023-11-23: qty 90, 30d supply, fill #0
  Filled 2024-02-09: qty 90, 30d supply, fill #1

## 2023-11-23 MED ORDER — METFORMIN HCL 500 MG PO TABS
250.0000 mg | ORAL_TABLET | Freq: Two times a day (BID) | ORAL | 1 refills | Status: DC
  Filled 2023-11-23: qty 90, 90d supply, fill #0

## 2023-11-23 MED ORDER — ALBUTEROL SULFATE HFA 108 (90 BASE) MCG/ACT IN AERS
2.0000 | INHALATION_SPRAY | RESPIRATORY_TRACT | 1 refills | Status: DC | PRN
  Filled 2023-11-23: qty 6.7, 17d supply, fill #0

## 2023-11-23 MED ORDER — METHOCARBAMOL 500 MG PO TABS
500.0000 mg | ORAL_TABLET | Freq: Every morning | ORAL | 1 refills | Status: DC
  Filled 2023-11-23: qty 30, 30d supply, fill #0

## 2023-11-23 MED ORDER — CIPROFLOXACIN HCL 0.3 % OP SOLN
2.0000 [drp] | OPHTHALMIC | 0 refills | Status: DC
  Filled 2023-11-23: qty 5, 10d supply, fill #0

## 2023-11-23 NOTE — Progress Notes (Signed)
 Pain in back Thoracic- ~ 2 weeks  Headache on left side of head, making it hard to focus, not related to vision but is forgetful.  Lumbar back pain ~ 4 days on the right side and to the sides Worse with voiding Denies odor, discharge, pain in pelvic area Completed course of antibiotics  Anxiety/ depression  Medication refill

## 2023-11-23 NOTE — Progress Notes (Signed)
 Subjective:   Gina Shelton is a 48 y.o. female presents for hospital follow up . Admit date to the hospital was 10/28/23, patient was discharged from the hospital on 10/29/23, patient was admitted for: generalized abdominal pain and pain in the right side of her back for the past few days associated with bodyaches, urinary frequency without dysuria .Completed all abt's.  Patient is complaining of headaches and thoracic pain which she rates as this is a 9 out of 10 that hurts every time she moves.  Aggravating factor hurts more when urinates. Alleviating factors sitting or laying down. She is currently not having a headache now it is mainly on the left side near frontal and orbital area.Change in vision last eye exam 3 > ago. Frequent urination. UTI negative. H/H below normal anemia- heavy menorrhagia irregular.  Patient also mention when she wakes up in the morning her eyes are crusty running and with a older and red.  No Known Allergies  Current Outpatient Medications on File Prior to Visit  Medication Sig Dispense Refill   esomeprazole  (NEXIUM ) 40 MG capsule Take 1 capsule (40 mg total) by mouth daily. 90 capsule 0   fluticasone  (FLONASE ) 50 MCG/ACT nasal spray Place 2 sprays into both nostrils daily. 16 g 3   levocetirizine (XYZAL  ALLERGY 24HR) 5 MG tablet Take 1 tablet (5 mg total) by mouth every evening. 90 tablet 1   Acetaminophen  (TYLENOL  PO) Take 500 mg by mouth as needed.     Blood Pressure Monitor KIT 1 kit by Does not apply route 3 (three) times daily as needed. 1 kit 0   cholecalciferol (VITAMIN D3) 25 MCG (1000 UNIT) tablet Take 1 tablet (1,000 Units total) by mouth every morning. 90 tablet 1   colchicine  0.6 MG tablet Take 1 tablet (0.6 mg total) by mouth daily as needed (gout pain). 90 tablet 1   gabapentin  (NEURONTIN ) 300 MG capsule Take 1 capsule (300 mg total) by mouth 3 (three) times daily. (Patient not taking: Reported on 11/23/2023) 90 capsule 3   No current  facility-administered medications on file prior to visit.    Review of System: ROS Comprehensive ROS Pertinent positive and negative noted in HPI   Objective:  BP 116/79 (BP Location: Left Arm, Patient Position: Sitting, Cuff Size: Normal)   Pulse 80   Temp 98.5 F (36.9 C) (Oral)   Resp 16   Ht 5' 4 (1.626 m)   Wt 197 lb (89.4 kg)   LMP 10/29/2023   SpO2 99%   BMI 33.81 kg/m   Filed Weights   11/23/23 1408  Weight: 197 lb (89.4 kg)    Physical Exam Vitals reviewed.  Constitutional:      Appearance: Normal appearance. She is obese.  HENT:     Head: Normocephalic.     Right Ear: Tympanic membrane, ear canal and external ear normal.     Left Ear: Tympanic membrane, ear canal and external ear normal.     Nose: Nose normal.     Mouth/Throat:     Mouth: Mucous membranes are moist.  Eyes:     Extraocular Movements: Extraocular movements intact.     Pupils: Pupils are equal, round, and reactive to light.  Cardiovascular:     Rate and Rhythm: Normal rate.  Pulmonary:     Effort: Pulmonary effort is normal.     Breath sounds: Normal breath sounds.  Abdominal:     General: Bowel sounds are normal.     Palpations: Abdomen  is soft.  Musculoskeletal:        General: Normal range of motion.     Cervical back: Normal range of motion.  Skin:    General: Skin is warm and dry.  Neurological:     Mental Status: She is alert and oriented to person, place, and time.  Psychiatric:        Mood and Affect: Mood normal.        Behavior: Behavior normal.        Thought Content: Thought content normal.      Assessment:  Alaiah was seen today for hospitalization follow-up, back pain and anxiety.  Diagnoses and all orders for this visit:  Acute left-sided low back pain, unspecified whether sciatica present -     POCT URINALYSIS DIP (CLINITEK)  Obesity (BMI 30.0-34.9) Obesity is 30-39 indicating an excess in caloric intake or underlining conditions. This may lead to other  co-morbidities. Educated on lifestyle modifications of diet and exercise which may reduce obesity.    Hyperglycemia -     HgB A1c  Mixed simple and mucopurulent chronic bronchitis (HCC) -     albuterol  (VENTOLIN  HFA) 108 (90 Base) MCG/ACT inhaler; Inhale 2 puffs into the lungs every 4 (four) hours as needed for wheezing or shortness of breath.  Essential hypertension Well control DIET: Limit salt intake, read nutrition labels to check salt content, limit fried and high fatty foods  Avoid using multisymptom OTC cold preparations that generally contain sudafed which can rise BP. Consult with pharmacist on best cold relief products to use for persons with HTN EXERCISE Discussed incorporating exercise such as walking - 30 minutes most days of the week and can do in 10 minute intervals    -     hydrochlorothiazide  (HYDRODIURIL ) 25 MG tablet; TAKE 1 TABLET (25 MG TOTAL) BY MOUTH DAILY.  Blepharoconjunctivitis of both eyes, unspecified blepharoconjunctivitis type -     ciprofloxacin  (CILOXAN ) 0.3 % ophthalmic solution; Place 2 drops into both eyes every 4 (four) hours while awake. Administer 1 drop, every 2 hours, while awake, for 2 days. Then 1 drop, every 4 hours, while awake, for the next 5 days.  Colon cancer screening -     Ambulatory referral to Gastroenterology  Encounter for screening mammogram for malignant neoplasm of breast -     MM 3D SCREENING MAMMOGRAM BILATERAL BREAST; Future  Other orders -     sertraline  (ZOLOFT ) 50 MG tablet; Take 3 tablets (150 mg total) by mouth every morning. -     methocarbamol  (ROBAXIN ) 500 MG tablet; Take 1 tablet (500 mg total) by mouth every morning. -     metFORMIN  (GLUCOPHAGE ) 500 MG tablet; Take 0.5 tablets (250 mg total) by mouth 2 (two) times daily.     This note has been created with Education officer, environmental. Any transcriptional errors are unintentional.   pap  Rosaline SHAUNNA Bohr, NP 11/23/2023, 3:46  PM

## 2023-11-24 LAB — MICROALBUMIN / CREATININE URINE RATIO
Creatinine, Urine: 139.8 mg/dL
Microalb/Creat Ratio: 4 mg/g{creat} (ref 0–29)
Microalbumin, Urine: 5.8 ug/mL

## 2023-11-27 ENCOUNTER — Ambulatory Visit (HOSPITAL_COMMUNITY): Payer: Self-pay

## 2023-11-30 ENCOUNTER — Other Ambulatory Visit: Payer: Self-pay

## 2023-12-11 ENCOUNTER — Telehealth (INDEPENDENT_AMBULATORY_CARE_PROVIDER_SITE_OTHER): Payer: Self-pay | Admitting: Primary Care

## 2023-12-11 NOTE — Telephone Encounter (Signed)
 Called pt to reschedule appt. Pt did not answer and LVM for pt to call us  back when they want to reschedule.

## 2023-12-14 ENCOUNTER — Ambulatory Visit (INDEPENDENT_AMBULATORY_CARE_PROVIDER_SITE_OTHER): Admitting: Primary Care

## 2023-12-26 ENCOUNTER — Emergency Department (HOSPITAL_COMMUNITY)

## 2023-12-26 ENCOUNTER — Other Ambulatory Visit: Payer: Self-pay

## 2023-12-26 ENCOUNTER — Ambulatory Visit: Payer: Self-pay

## 2023-12-26 ENCOUNTER — Emergency Department (HOSPITAL_COMMUNITY): Admission: EM | Admit: 2023-12-26 | Discharge: 2023-12-26 | Discharge status: Against Medical Advice

## 2023-12-26 ENCOUNTER — Encounter (HOSPITAL_COMMUNITY): Payer: Self-pay

## 2023-12-26 DIAGNOSIS — R0789 Other chest pain: Secondary | ICD-10-CM | POA: Diagnosis present

## 2023-12-26 DIAGNOSIS — Z5321 Procedure and treatment not carried out due to patient leaving prior to being seen by health care provider: Secondary | ICD-10-CM | POA: Diagnosis not present

## 2023-12-26 LAB — CBC
HCT: 34.9 % — ABNORMAL LOW (ref 36.0–46.0)
Hemoglobin: 10.9 g/dL — ABNORMAL LOW (ref 12.0–15.0)
MCH: 25.3 pg — ABNORMAL LOW (ref 26.0–34.0)
MCHC: 31.2 g/dL (ref 30.0–36.0)
MCV: 81.2 fL (ref 80.0–100.0)
Platelets: 331 K/uL (ref 150–400)
RBC: 4.3 MIL/uL (ref 3.87–5.11)
RDW: 17.7 % — ABNORMAL HIGH (ref 11.5–15.5)
WBC: 10.7 K/uL — ABNORMAL HIGH (ref 4.0–10.5)
nRBC: 0 % (ref 0.0–0.2)

## 2023-12-26 LAB — BASIC METABOLIC PANEL WITH GFR
Anion gap: 15 (ref 5–15)
BUN: 10 mg/dL (ref 6–20)
CO2: 23 mmol/L (ref 22–32)
Calcium: 9.1 mg/dL (ref 8.9–10.3)
Chloride: 98 mmol/L (ref 98–111)
Creatinine, Ser: 0.52 mg/dL (ref 0.44–1.00)
GFR, Estimated: >60 mL/min (ref >60)
Glucose, Bld: 102 mg/dL — ABNORMAL HIGH (ref 70–99)
Potassium: 3.1 mmol/L — ABNORMAL LOW (ref 3.5–5.1)
Sodium: 136 mmol/L (ref 135–145)

## 2023-12-26 LAB — TROPONIN I (HIGH SENSITIVITY): Troponin I (High Sensitivity): 3 ng/L (ref <18)

## 2023-12-26 LAB — HCG, SERUM, QUALITATIVE: Preg, Serum: NEGATIVE

## 2023-12-26 NOTE — ED Notes (Addendum)
 Pt approached ED Tech and stated that she no longer wanted to wait in the lobby. Pt was encouraged to stay but stated she would feel better at home and would talk to her PCP tomorrow. Quick Triage RN consulted and IV was removed. MSE had previously been signed and pt was witnessed leaving the emergency department with her family member.

## 2023-12-26 NOTE — Telephone Encounter (Signed)
 Pt is currently at the ED  FYI

## 2023-12-26 NOTE — Telephone Encounter (Signed)
 TY

## 2023-12-26 NOTE — ED Triage Notes (Signed)
 Pt BIB [facility] from work for mid-sternal CP times 2 weeks. Pain changes with palpation and movement. Pain across shoulders and into neck. Complains of constipation, no BM x 2 days. Pt states has visualized small amt of blood in stool.   132/90 HR 80, 98%  Lung sounds clear.  Pt is anxious about getting good care.   Hx of ETOH years ago but is clean now.

## 2023-12-26 NOTE — Telephone Encounter (Signed)
 FYI Only or Action Required?: FYI only for provider.  Patient was last seen in primary care on 11/23/2023 by Celestia Rosaline SQUIBB, NP.  Called Nurse Triage reporting Chest Pain and Back Pain.  Symptoms began a week ago.  Interventions attempted: Nothing.  Symptoms are: gradually worsening.  Triage Disposition: Call EMS 911 Now  Patient/caregiver understands and will follow disposition?: Yes     Copied from CRM [id number]. Topic: Clinical - Red Word Triage >> Dec 26, 2023  1:32 PM Cleave MATSU wrote: Red Word that prompted transfer to Nurse Triage: back is burning chest is hurting and real bad headache Reason for Disposition  [1] Chest pain lasts > 5 minutes AND [2] age > 109  Answer Assessment - Initial Assessment Questions Back is burning on the inside Chest pain x1 week, intermittent, worse with movement, breathing, cough, headache on left side, from front to the back Unsure 9/10- moving makes it worse   Has to breath shallow- did inhaler   1. LOCATION: Where does it hurt?       Chest unsure of exact location 2. RADIATION: Does the pain go anywhere else? (e.g., into neck, jaw, arms, back)     Back and headache 3. ONSET: When did the chest pain begin? (Minutes, hours or days)      On and off for the last week 4. PATTERN: Does the pain come and go, or has it been constant since it started?  Does it get worse with exertion?      On and off  6. SEVERITY: How bad is the pain?  (e.g., Scale 1-10; mild, moderate, or severe)     9 7. CARDIAC RISK FACTORS: Do you have any history of heart problems or risk factors for heart disease? (e.g., angina, prior heart attack; diabetes, high blood pressure, high cholesterol, smoker, or strong family history of heart disease)      8. PULMONARY RISK FACTORS: Do you have any history of lung disease?  (e.g., blood clots in lung, asthma, emphysema, birth control pills)      9. CAUSE: What do you think is causing the chest pain?       10. OTHER SYMPTOMS: Do you have any other symptoms? (e.g., dizziness, nausea, vomiting, sweating, fever, difficulty breathing, cough)       Difficulty breathing  Protocols used: Chest Pain-A-AH

## 2023-12-26 NOTE — ED Provider Triage Note (Signed)
 Emergency Medicine Provider Triage Evaluation Note  Shailey ODELLA APPELHANS , a 48 y.o. female  was evaluated in triage.  Pt complains of chest pain that's been going on for a few weeks  Review of Systems  Positive: Chest pain, shortness of breath, back pain  Negative: Nausea, vomiting  Physical Exam  BP 114/76 (BP Location: Right Arm)   Pulse 82   Temp 98.2 F (36.8 C)   Resp 17   SpO2 100%  Gen:   Awake, no distress   Resp:  Normal effort  MSK:   Moves extremities without difficulty    Medical Decision Making  Medically screening exam initiated at 3:13 PM.  Appropriate orders placed.  Taneika JULIANNA Gull was informed that the remainder of the evaluation will be completed by another provider, this initial triage assessment does not replace that evaluation, and the importance of remaining in the ED until their evaluation is complete.     Gennaro Duwaine CROME, DO 12/26/23 [phone]

## 2023-12-27 ENCOUNTER — Emergency Department (HOSPITAL_COMMUNITY)
Admission: EM | Admit: 2023-12-27 | Discharge: 2023-12-27 | Disposition: A | Discharge status: Home / Self Care | Attending: Emergency Medicine | Admitting: Emergency Medicine

## 2023-12-27 ENCOUNTER — Ambulatory Visit: Payer: Self-pay

## 2023-12-27 ENCOUNTER — Encounter (HOSPITAL_COMMUNITY): Payer: Self-pay

## 2023-12-27 ENCOUNTER — Emergency Department (HOSPITAL_COMMUNITY)

## 2023-12-27 DIAGNOSIS — Z7984 Long term (current) use of oral hypoglycemic drugs: Secondary | ICD-10-CM | POA: Diagnosis not present

## 2023-12-27 DIAGNOSIS — R1084 Generalized abdominal pain: Secondary | ICD-10-CM | POA: Insufficient documentation

## 2023-12-27 DIAGNOSIS — R079 Chest pain, unspecified: Secondary | ICD-10-CM | POA: Diagnosis not present

## 2023-12-27 DIAGNOSIS — E119 Type 2 diabetes mellitus without complications: Secondary | ICD-10-CM | POA: Diagnosis not present

## 2023-12-27 DIAGNOSIS — K219 Gastro-esophageal reflux disease without esophagitis: Secondary | ICD-10-CM | POA: Insufficient documentation

## 2023-12-27 DIAGNOSIS — F17200 Nicotine dependence, unspecified, uncomplicated: Secondary | ICD-10-CM | POA: Insufficient documentation

## 2023-12-27 DIAGNOSIS — R059 Cough, unspecified: Secondary | ICD-10-CM | POA: Insufficient documentation

## 2023-12-27 DIAGNOSIS — I10 Essential (primary) hypertension: Secondary | ICD-10-CM | POA: Diagnosis not present

## 2023-12-27 DIAGNOSIS — M25512 Pain in left shoulder: Secondary | ICD-10-CM | POA: Diagnosis not present

## 2023-12-27 DIAGNOSIS — N898 Other specified noninflammatory disorders of vagina: Secondary | ICD-10-CM | POA: Insufficient documentation

## 2023-12-27 DIAGNOSIS — E876 Hypokalemia: Secondary | ICD-10-CM | POA: Insufficient documentation

## 2023-12-27 DIAGNOSIS — R110 Nausea: Secondary | ICD-10-CM | POA: Diagnosis not present

## 2023-12-27 DIAGNOSIS — M549 Dorsalgia, unspecified: Secondary | ICD-10-CM | POA: Diagnosis not present

## 2023-12-27 DIAGNOSIS — Z79899 Other long term (current) drug therapy: Secondary | ICD-10-CM | POA: Insufficient documentation

## 2023-12-27 LAB — CBC
HCT: 35.4 % — ABNORMAL LOW (ref 36.0–46.0)
Hemoglobin: 11 g/dL — ABNORMAL LOW (ref 12.0–15.0)
MCH: 25.6 pg — ABNORMAL LOW (ref 26.0–34.0)
MCHC: 31.1 g/dL (ref 30.0–36.0)
MCV: 82.3 fL (ref 80.0–100.0)
Platelets: 311 K/uL (ref 150–400)
RBC: 4.3 MIL/uL (ref 3.87–5.11)
RDW: 17.9 % — ABNORMAL HIGH (ref 11.5–15.5)
WBC: 9.8 K/uL (ref 4.0–10.5)
nRBC: 0 % (ref 0.0–0.2)

## 2023-12-27 LAB — BASIC METABOLIC PANEL WITH GFR
Anion gap: 12 (ref 5–15)
BUN: 8 mg/dL (ref 6–20)
CO2: 22 mmol/L (ref 22–32)
Calcium: 9 mg/dL (ref 8.9–10.3)
Chloride: 106 mmol/L (ref 98–111)
Creatinine, Ser: 0.54 mg/dL (ref 0.44–1.00)
GFR, Estimated: >60 mL/min (ref >60)
Glucose, Bld: 122 mg/dL — ABNORMAL HIGH (ref 70–99)
Potassium: 3.3 mmol/L — ABNORMAL LOW (ref 3.5–5.1)
Sodium: 140 mmol/L (ref 135–145)

## 2023-12-27 LAB — RESP PANEL BY RT-PCR (RSV, FLU A&B, COVID)  RVPGX2
Influenza A by PCR: NEGATIVE
Influenza B by PCR: NEGATIVE
Resp Syncytial Virus by PCR: NEGATIVE
SARS Coronavirus 2 by RT PCR: NEGATIVE

## 2023-12-27 LAB — WET PREP, GENITAL
Clue Cells Wet Prep HPF POC: NONE SEEN
Sperm: NONE SEEN
Trich, Wet Prep: NONE SEEN
WBC, Wet Prep HPF POC: <10 (ref <10)
Yeast Wet Prep HPF POC: NONE SEEN

## 2023-12-27 LAB — TROPONIN I (HIGH SENSITIVITY)
Troponin I (High Sensitivity): <2 ng/L (ref <18)
Troponin I (High Sensitivity): 4 ng/L (ref <18)

## 2023-12-27 LAB — D-DIMER, QUANTITATIVE: D-Dimer, Quant: 0.27 ug{FEU}/mL (ref 0.00–0.50)

## 2023-12-27 LAB — HCG, SERUM, QUALITATIVE: Preg, Serum: NEGATIVE

## 2023-12-27 MED ORDER — KETOROLAC TROMETHAMINE 15 MG/ML IJ SOLN
15.0000 mg | Freq: Once | INTRAMUSCULAR | Status: AC
  Administered 2023-12-27: 15 mg via INTRAVENOUS
  Filled 2023-12-27: qty 1

## 2023-12-27 MED ORDER — FAMOTIDINE IN NACL 20-0.9 MG/50ML-% IV SOLN
20.0000 mg | Freq: Once | INTRAVENOUS | Status: AC
  Administered 2023-12-27: 20 mg via INTRAVENOUS
  Filled 2023-12-27: qty 50

## 2023-12-27 MED ORDER — LIDOCAINE 5 % EX PTCH
1.0000 | MEDICATED_PATCH | CUTANEOUS | 0 refills | Status: AC
  Filled 2023-12-27: qty 30, 30d supply, fill #0

## 2023-12-27 MED ORDER — ONDANSETRON HCL 4 MG/2ML IJ SOLN
4.0000 mg | Freq: Once | INTRAMUSCULAR | Status: AC
  Administered 2023-12-27: 4 mg via INTRAVENOUS
  Filled 2023-12-27: qty 2

## 2023-12-27 MED ORDER — BENZONATATE 100 MG PO CAPS
100.0000 mg | ORAL_CAPSULE | Freq: Three times a day (TID) | ORAL | 0 refills | Status: DC
  Filled 2023-12-27: qty 21, 7d supply, fill #0

## 2023-12-27 NOTE — Telephone Encounter (Signed)
 noted

## 2023-12-27 NOTE — Discharge Instructions (Addendum)
 Continue Tylenol /ibuprofen  as needed for back pain, you are already prescribed gabapentin  and Robaxin  which also help alleviate pain/muscle spasms, use these as directed.  Your chest and back pain may be due to excessive coughing, your coughing is likely attributed to a viral upper respiratory illness, as you do not have COVID/flu/RSV and your chest x-ray did not show any signs of pneumonia.  Continue over-the-counter cough medications, you may also try Tessalon , 1 tablet by mouth every 8 hours as needed for cough.  If your back pain persists, I recommend that you follow-up with an orthopedic specialist, I have provided their contact information so that you may contact their office to schedule follow-up as needed.  Follow-up with your PCP as needed.  Return to the emergency department if your symptoms worsen.

## 2023-12-27 NOTE — ED Triage Notes (Addendum)
 Pt c/o central chest pain that radiating to back ongoing for a couple of weeks, worse when she is moving around. Has a productive cough, SHOB for the past few days.

## 2023-12-27 NOTE — Telephone Encounter (Signed)
 Patient agreed to return to the ED.   FYI Only or Action Required?: FYI only for provider.  Patient was last seen in primary care on 11/23/2023 by Celestia Rosaline SQUIBB, NP.  Called Nurse Triage reporting Chest Pain and Back Pain.  Symptoms began several weeks ago.  Interventions attempted: OTC medications: coricidin.  Symptoms are: rapidly worsening.  Triage Disposition: Go to ED Now (or PCP Triage)  Patient/caregiver understands and will follow disposition?: Yes Reason for Disposition  [1] Chest pain lasts > 5 minutes AND [2] occurred in past 3 days (72 hours) (Exception: Feels exactly the same as previously diagnosed heartburn and has accompanying sour taste in mouth.)  Answer Assessment - Initial Assessment Questions Pt left ED AMA yesterday for chest pain, headache and pain from her neck to lower back. States she was told ECG was WNL. States pain is only with movement or when coughing. States asymptomatic at rest.   Symptoms began 2 weeks after she developed a productive cough that has not improved with coricidin, states is producing green sputum. Cough has been x 4 weeks. Unknown fever, but states has had chills and sweating. Hx of COPD and bronchitis. Describes back pain as a burning sensation. Denies rash.   1. LOCATION: Where does it hurt?       Mid chest and entire back  2. RADIATION: Does the pain go anywhere else? (e.g., into neck, jaw, arms, back)     See above  3. ONSET: When did the chest pain begin? (Minutes, hours or days)      2 weeks  4. PATTERN: Does the pain come and go, or has it been constant since it started?  Does it get worse with exertion?      Constant when moving. Asymptomatic at rest.  5. DURATION: How long does it last (e.g., seconds, minutes, hours)     Lasts while moving  6. SEVERITY: How bad is the pain?  (e.g., Scale 1-10; mild, moderate, or severe)     Depends on movement  7. CARDIAC RISK FACTORS: Do you have any history of  heart problems or risk factors for heart disease? (e.g., angina, prior heart attack; diabetes, high blood pressure, high cholesterol, smoker, or strong family history of heart disease)     HTN, DM  8. PULMONARY RISK FACTORS: Do you have any history of lung disease?  (e.g., blood clots in lung, asthma, emphysema, birth control pills)     Asthma, bronchitis, COPD  9. CAUSE: What do you think is causing the chest pain?     Unknown  Protocols used: Chest Pain-A-AH Copied from CRM #[id number]. Topic: Clinical - Red Word Triage >> Dec 27, 2023  9:26 AM Larissa RAMAN wrote: [facility] that prompted transfer to Nurse Triage: chest and back pain- PT left hospital

## 2023-12-27 NOTE — ED Triage Notes (Signed)
 Patient states that chest pain and back pain that started 2 weeks ago. She states she came in last night but she was uncomfortable so she went home. She states that she spoke to her PCP this morning that told her she needed to come back to the ED.

## 2023-12-27 NOTE — ED Provider Notes (Signed)
 [facility] AT [facility] Provider Note   CSN: [id number] Arrival date & time: 12/27/23  1237     Patient presents with: No chief complaint on file.   Gina Shelton is a 48 y.o. female.   48 year old female presenting with multiple complaints.  Patient complains of chest pain, upper back/left shoulder pain, and nausea that has been ongoing for weeks.  Describes chest pain as substernal/epigastric and sharp, she describes upper back/shoulder pain as burning.  She reports that her symptoms are exacerbated by movement and taking a deep breath.  She notes a history of GERD/stomach ulcers and reports that this pain feels similar.  No history of PE/DVT, no OCP use.  She also notes a change in the appearance of her vaginal discharge that she noted yesterday, denies any other vaginal symptoms including change in odor or vaginal itching.  She presented to the emergency department yesterday for similar complaints but left without being seen, she reports that her family members/friends have been telling her for weeks that she should come be evaluated in the emergency department.        Prior to Admission medications   Medication Sig Start Date End Date Taking? Authorizing Provider  Acetaminophen  (TYLENOL  PO) Take 500 mg by mouth as needed.    [provider]  albuterol  (VENTOLIN  HFA) 108 (90 Base) MCG/ACT inhaler Inhale 2 puffs into the lungs every 4 (four) hours as needed for wheezing or shortness of breath. 11/23/23   Celestia Rosaline SQUIBB, NP  Blood Pressure Monitor KIT 1 kit by Does not apply route 3 (three) times daily as needed. 04/04/19   Celestia Rosaline SQUIBB, NP  cholecalciferol (VITAMIN D3) 25 MCG (1000 UNIT) tablet Take 1 tablet (1,000 Units total) by mouth every morning. 09/26/22   Celestia Rosaline SQUIBB, NP  ciprofloxacin  (CILOXAN ) 0.3 % ophthalmic solution Administer 1 drop into both eyes, every 2 hours, while awake, for 2 days. Then 1 drop, every  4 hours, while awake, for the next 5 days. 11/23/23   Celestia Rosaline SQUIBB, NP  colchicine  0.6 MG tablet Take 1 tablet (0.6 mg total) by mouth daily as needed (gout pain). 10/27/22   Celestia Rosaline SQUIBB, NP  esomeprazole  (NEXIUM ) 40 MG capsule Take 1 capsule (40 mg total) by mouth daily. 10/20/23   Celestia Rosaline SQUIBB, NP  fluticasone  (FLONASE ) 50 MCG/ACT nasal spray Place 2 sprays into both nostrils daily. 10/27/22   Celestia Rosaline SQUIBB, NP  gabapentin  (NEURONTIN ) 300 MG capsule Take 1 capsule (300 mg total) by mouth 3 (three) times daily. Patient not taking: Reported on 11/23/2023 09/01/23   Standiford, Marsa JULIANNA, DPM  hydrochlorothiazide  (HYDRODIURIL ) 25 MG tablet Take 1 tablet (25 mg total) by mouth daily. 11/23/23   Celestia Rosaline SQUIBB, NP  levocetirizine (XYZAL  ALLERGY 24HR) 5 MG tablet Take 1 tablet (5 mg total) by mouth every evening. 09/01/23   Celestia Rosaline SQUIBB, NP  metFORMIN  (GLUCOPHAGE ) 500 MG tablet Take 0.5 tablets (250 mg total) by mouth 2 (two) times daily. 11/23/23   Celestia Rosaline SQUIBB, NP  methocarbamol  (ROBAXIN ) 500 MG tablet Take 1 tablet (500 mg total) by mouth every morning. 11/23/23   Celestia Rosaline SQUIBB, NP  sertraline  (ZOLOFT ) 50 MG tablet Take 3 tablets (150 mg total) by mouth every morning. 11/23/23   Celestia Rosaline SQUIBB, NP    Allergies: Patient has no known allergies.    Review of Systems  Updated Vital Signs  Vitals:   12/27/23 1456 12/27/23 1739 12/27/23  1740  BP: 108/83  123/83  Pulse: 75  78  Resp: 16  14  Temp:  98.2 F (36.8 C)   TempSrc:  Oral   SpO2: 100%  100%     Physical Exam Vitals and nursing note reviewed.  HENT:     Head: Normocephalic.  Eyes:     Extraocular Movements: Extraocular movements intact.  Neck:     Comments: Tenderness in distribution of trapezius L>R Mild midline TTP of C-spine without bony deformity or step-off Cardiovascular:     Rate and Rhythm: Normal rate and regular rhythm.     Heart sounds: Normal heart sounds.   Pulmonary:     Effort: Pulmonary effort is normal.     Breath sounds: Normal breath sounds.  Abdominal:     Palpations: Abdomen is soft.     Comments: Diffuse tenderness out of proportion to exam, worse in epigastric region, easily distractible   Musculoskeletal:     Cervical back: Normal range of motion.     Right lower leg: No edema.     Left lower leg: No edema.     Comments: Back: No midline spinous TTP of thoracic/lumbar spine. Moves all extremities spontaneously without difficulty  Skin:    General: Skin is warm and dry.  Neurological:     General: No focal deficit present.     Mental Status: She is alert and oriented to person, place, and time.     (all labs ordered are listed, but only abnormal results are displayed) Labs Reviewed  BASIC METABOLIC PANEL WITH GFR - Abnormal; Notable for the following components:      Result Value   Potassium 3.3 (*)    Glucose, Bld 122 (*)    All other components within normal limits  CBC - Abnormal; Notable for the following components:   Hemoglobin 11.0 (*)    HCT 35.4 (*)    MCH 25.6 (*)    RDW 17.9 (*)    All other components within normal limits  RESP PANEL BY RT-PCR (RSV, FLU A&B, COVID)  RVPGX2  WET PREP, GENITAL  HCG, SERUM, QUALITATIVE  D-DIMER, QUANTITATIVE  GC/CHLAMYDIA PROBE AMP ([place]) NOT AT [facility]  TROPONIN I (HIGH SENSITIVITY)  TROPONIN I (HIGH SENSITIVITY)    EKG: None  Radiology: DG Chest 2 View Result Date: 12/27/2023 EXAM: 2 VIEW(S) XRAY OF THE CHEST 12/27/2023 01:59:00 PM COMPARISON: Comparison with yesterday. CLINICAL HISTORY: sHOB/chest pain, midline CP into left upper back with burning in entire back - per pt x 2 days ago, HTN, diabetic, smoker. FINDINGS: LUNGS AND PLEURA: No focal pulmonary opacity. No pulmonary edema. No pleural effusion. No pneumothorax. HEART AND MEDIASTINUM: No acute abnormality of the cardiac and mediastinal silhouettes. BONES AND SOFT TISSUES: No acute osseous abnormality.  IMPRESSION: 1. No acute cardiopulmonary abnormality detected. Electronically signed by: Lynwood Seip MD 12/27/2023 02:11 PM EDT RP Workstation: [id number]   DG Chest 2 View Result Date: 12/26/2023 CLINICAL DATA:  Chest pain. EXAM: CHEST - 2 VIEW COMPARISON:  September 26, 2017 FINDINGS: The heart size and mediastinal contours are within normal limits. Both lungs are clear. The visualized skeletal structures are unremarkable. IMPRESSION: No active cardiopulmonary disease. Electronically Signed   By: Suzen Dials M.D.   On: 12/26/2023 16:10     Procedures   Medications Ordered in the ED  famotidine  (PEPCID ) IVPB 20 mg premix (0 mg Intravenous Stopped 12/27/23 2031)  ondansetron  (ZOFRAN ) injection 4 mg (4 mg Intravenous Given 12/27/23 1959)  ketorolac  (TORADOL )  15 MG/ML injection 15 mg (15 mg Intravenous Given 12/27/23 2042)                                    Medical Decision Making This patient presents to the ED for concern of multiple complaints, this involves an extensive number of treatment options, and is a complaint that carries with it a high risk of complications and morbidity.  The differential diagnosis includes ACS, PE, musculoskeletal pain/strain/sprain, GERD, PUD, BV, yeast infection, gonorrhea/chlamydia, other STI   Co morbidities that complicate the patient evaluation  Diabetes, hypertension, GERD   Additional history obtained:  Additional history obtained from record review External records from outside source obtained and reviewed including prior ED notes   Lab Tests:  I Ordered, and personally interpreted labs.  The pertinent results include: CBC stable from previous, no leukocytosis.  BMP with mild hypokalemia with potassium of 3.3, this is improved as compared to yesterday with potassium of 3.1 noted at that time. Initial troponin <2, second troponin 4.  COVID/flu/RSV negative.  D-dimer 0.27, very low suspicion for PE/DVT based on these findings.  Wet prep  negative for yeast/trichomoniasis/BV.   Imaging Studies ordered:  I ordered imaging studies including CXR  I independently visualized and interpreted imaging which showed 1. No acute cardiopulmonary abnormality detected.  I agree with the radiologist interpretation   Cardiac Monitoring: / EKG:  The patient was maintained on a cardiac monitor.  I personally viewed and interpreted the cardiac monitored which showed an underlying rhythm of: NSR   Problem List / ED Course / Critical interventions / Medication management  I ordered medication including Zofran  for nausea, Pepcid  for suspected GERD, Toradol  for back pain Reevaluation of the patient after these medicines showed that the patient improved I have reviewed the patients home medicines and have made adjustments as needed   Social Determinants of Health:  Tobacco use, depression, insufficient physical activity   Test / Admission - Considered:  Physical exam is notable as above.  Patient has multiple complaints, including back pain, chest pain, cough, generalized abdominal pain, vaginal discharge.  Labs are largely unremarkable as above, low suspicion for ACS/PE based on reassuring lab results as above.   Patient does note improvement in her symptoms after receiving Toradol /famotidine /Zofran .  I suspect that the patient's back pain/chest pain may be due to excessive coughing, as she notes that she was not having these issues prior to the onset of her cough, COVID/flu/RSV negative, chest x-ray without evidence of consolidation suggestive of pneumonia.  Will prescribe Tessalon  [place] to be used as needed for cough.  Patient did note generalized abdominal pain on exam, however abdominal pain was out of proportion, patient was describing pain even with light touch, after receiving Toradol  she no longer complained of generalized abdominal pain, I do not feel that CT imaging of her abdomen/pelvis is warranted based on these reassuring  findings.  Patient has struggled with back pain previously, I recommend that she follow-up with an orthopedic specialist as this seems to be a chronic issue.  Majority of her neck pain seems to be muscular in the distribution of the trapezius, she is already prescribed Robaxin  and I recommend that she continue to take this as directed/as needed for muscle spasms, she has also noted symptomatic improvement in the past with use of lidocaine  patches.  Patient is in agreement this plan, return precautions discussed, she is appropriate for  discharge at this time.    Amount and/or Complexity of Data Reviewed Labs: ordered. Radiology: ordered.  Risk Prescription drug management.        Final diagnoses:  Cough, unspecified type  Back pain, unspecified back location, unspecified back pain laterality, unspecified chronicity  Chest pain, unspecified type    ED Discharge Orders          Ordered    benzonatate  (TESSALON ) 100 MG capsule  Every 8 hours        12/27/23 2115    lidocaine  (LIDODERM ) 5 %  Every 24 hours        12/27/23 2115               Glendia Rocky LOISE DEVONNA 12/27/23 2311    Francesca Elsie CROME, MD 12/29/23 1901    Francesca Elsie CROME, MD 02/03/24 1759

## 2023-12-28 ENCOUNTER — Other Ambulatory Visit: Payer: Self-pay

## 2023-12-28 LAB — GC/CHLAMYDIA PROBE AMP (~~LOC~~) NOT AT ARMC
Chlamydia: NEGATIVE
Comment: NEGATIVE
Comment: NORMAL
Neisseria Gonorrhea: NEGATIVE

## 2024-01-08 ENCOUNTER — Other Ambulatory Visit: Payer: Self-pay

## 2024-02-09 ENCOUNTER — Other Ambulatory Visit (INDEPENDENT_AMBULATORY_CARE_PROVIDER_SITE_OTHER): Payer: Self-pay | Admitting: Primary Care

## 2024-02-09 ENCOUNTER — Other Ambulatory Visit: Payer: Self-pay

## 2024-02-13 ENCOUNTER — Other Ambulatory Visit: Payer: Self-pay

## 2024-02-13 MED ORDER — ESOMEPRAZOLE MAGNESIUM 40 MG PO CPDR
40.0000 mg | DELAYED_RELEASE_CAPSULE | Freq: Every day | ORAL | 1 refills | Status: DC
  Filled 2024-02-13: qty 90, 90d supply, fill #0

## 2024-02-13 NOTE — Telephone Encounter (Signed)
 Requested Prescriptions  Pending Prescriptions Disp Refills   esomeprazole  (NEXIUM ) 40 MG capsule 90 capsule 1    Sig: Take 1 capsule (40 mg total) by mouth daily.     Gastroenterology: Proton Pump Inhibitors 2 Passed - 02/13/2024  5:07 PM      Passed - ALT in normal range and within 360 days    ALT  Date Value Ref Range Status  10/29/2023 15 0 - 44 U/L Final         Passed - AST in normal range and within 360 days    AST  Date Value Ref Range Status  10/29/2023 20 15 - 41 U/L Final         Passed - Valid encounter within last 12 months    Recent Outpatient Visits           2 months ago Acute left-sided low back pain, unspecified whether sciatica present   [place] [facility] Celestia Rosaline SQUIBB, NP   11 months ago Colon cancer screening   [place] [facility] Celestia Rosaline SQUIBB, NP   1 year ago Tendinitis of both feet   [place] [facility] Celestia Rosaline SQUIBB, NP   1 year ago Medication refill   [place] [facility] Celestia Rosaline SQUIBB, NP   1 year ago Hyperglycemia   [place] [facility] Celestia Rosaline SQUIBB, NP

## 2024-02-22 ENCOUNTER — Other Ambulatory Visit: Payer: Self-pay

## 2024-03-29 ENCOUNTER — Other Ambulatory Visit: Payer: Self-pay

## 2024-04-02 ENCOUNTER — Other Ambulatory Visit: Payer: Self-pay

## 2024-04-02 ENCOUNTER — Encounter (INDEPENDENT_AMBULATORY_CARE_PROVIDER_SITE_OTHER): Payer: Self-pay | Admitting: Primary Care

## 2024-04-02 ENCOUNTER — Ambulatory Visit (INDEPENDENT_AMBULATORY_CARE_PROVIDER_SITE_OTHER): Payer: Self-pay | Admitting: Primary Care

## 2024-04-02 VITALS — BP 111/76 | HR 85 | Resp 16 | Ht 63.0 in | Wt 193.2 lb

## 2024-04-02 DIAGNOSIS — J418 Mixed simple and mucopurulent chronic bronchitis: Secondary | ICD-10-CM | POA: Diagnosis not present

## 2024-04-02 DIAGNOSIS — F332 Major depressive disorder, recurrent severe without psychotic features: Secondary | ICD-10-CM | POA: Diagnosis not present

## 2024-04-02 DIAGNOSIS — K219 Gastro-esophageal reflux disease without esophagitis: Secondary | ICD-10-CM

## 2024-04-02 DIAGNOSIS — G8929 Other chronic pain: Secondary | ICD-10-CM

## 2024-04-02 DIAGNOSIS — I10 Essential (primary) hypertension: Secondary | ICD-10-CM | POA: Diagnosis not present

## 2024-04-02 DIAGNOSIS — E559 Vitamin D deficiency, unspecified: Secondary | ICD-10-CM

## 2024-04-02 DIAGNOSIS — H10503 Unspecified blepharoconjunctivitis, bilateral: Secondary | ICD-10-CM

## 2024-04-02 DIAGNOSIS — D649 Anemia, unspecified: Secondary | ICD-10-CM

## 2024-04-02 DIAGNOSIS — R7309 Other abnormal glucose: Secondary | ICD-10-CM

## 2024-04-02 DIAGNOSIS — M546 Pain in thoracic spine: Secondary | ICD-10-CM

## 2024-04-02 DIAGNOSIS — Z1231 Encounter for screening mammogram for malignant neoplasm of breast: Secondary | ICD-10-CM

## 2024-04-02 DIAGNOSIS — M545 Low back pain, unspecified: Secondary | ICD-10-CM

## 2024-04-02 DIAGNOSIS — G63 Polyneuropathy in diseases classified elsewhere: Secondary | ICD-10-CM

## 2024-04-02 DIAGNOSIS — M1A09X0 Idiopathic chronic gout, multiple sites, without tophus (tophi): Secondary | ICD-10-CM | POA: Diagnosis not present

## 2024-04-02 DIAGNOSIS — G5601 Carpal tunnel syndrome, right upper limb: Secondary | ICD-10-CM

## 2024-04-02 DIAGNOSIS — Z1211 Encounter for screening for malignant neoplasm of colon: Secondary | ICD-10-CM

## 2024-04-02 DIAGNOSIS — J018 Other acute sinusitis: Secondary | ICD-10-CM | POA: Diagnosis not present

## 2024-04-02 MED ORDER — FLUTICASONE PROPIONATE 50 MCG/ACT NA SUSP
2.0000 | Freq: Every day | NASAL | 3 refills | Status: AC
  Filled 2024-04-02: qty 16, 30d supply, fill #0

## 2024-04-02 MED ORDER — ALLOPURINOL 100 MG PO TABS
100.0000 mg | ORAL_TABLET | Freq: Every day | ORAL | 1 refills | Status: AC
  Filled 2024-04-02: qty 90, 90d supply, fill #0

## 2024-04-02 MED ORDER — CIPROFLOXACIN HCL 0.3 % OP SOLN
2.0000 [drp] | OPHTHALMIC | 0 refills | Status: AC
  Filled 2024-04-02: qty 5, 10d supply, fill #0

## 2024-04-02 MED ORDER — METFORMIN HCL 500 MG PO TABS
250.0000 mg | ORAL_TABLET | Freq: Two times a day (BID) | ORAL | 1 refills | Status: AC
  Filled 2024-04-02: qty 90, 90d supply, fill #0

## 2024-04-02 MED ORDER — SERTRALINE HCL 50 MG PO TABS
150.0000 mg | ORAL_TABLET | Freq: Every morning | ORAL | 2 refills | Status: AC
  Filled 2024-04-02: qty 180, 60d supply, fill #0

## 2024-04-02 MED ORDER — COLCHICINE 0.6 MG PO TABS: 0.6000 mg | ORAL_TABLET | Freq: Every day | ORAL | 1 refills | Status: DC | PRN

## 2024-04-02 MED ORDER — LEVOCETIRIZINE DIHYDROCHLORIDE 5 MG PO TABS
5.0000 mg | ORAL_TABLET | Freq: Every evening | ORAL | 1 refills | Status: AC
  Filled 2024-04-02: qty 90, 90d supply, fill #0
  Filled 2024-04-03: qty 30, 30d supply, fill #0

## 2024-04-02 MED ORDER — HYDROCHLOROTHIAZIDE 25 MG PO TABS
25.0000 mg | ORAL_TABLET | Freq: Every day | ORAL | 1 refills | Status: AC
  Filled 2024-04-02 – 2024-04-03 (×2): qty 90, 90d supply, fill #0

## 2024-04-02 MED ORDER — ESOMEPRAZOLE MAGNESIUM 40 MG PO CPDR
40.0000 mg | DELAYED_RELEASE_CAPSULE | Freq: Every day | ORAL | 1 refills | Status: AC
  Filled 2024-04-02: qty 90, 90d supply, fill #0

## 2024-04-02 MED ORDER — GABAPENTIN 300 MG PO CAPS
300.0000 mg | ORAL_CAPSULE | Freq: Three times a day (TID) | ORAL | 3 refills | Status: AC
  Filled 2024-04-02: qty 90, 30d supply, fill #0

## 2024-04-02 MED ORDER — ALBUTEROL SULFATE HFA 108 (90 BASE) MCG/ACT IN AERS
2.0000 | INHALATION_SPRAY | RESPIRATORY_TRACT | 3 refills | Status: AC | PRN
  Filled 2024-04-02: qty 6.7, 17d supply, fill #0

## 2024-04-02 MED ORDER — METHOCARBAMOL 500 MG PO TABS
500.0000 mg | ORAL_TABLET | Freq: Every morning | ORAL | 1 refills | Status: AC
  Filled 2024-04-02: qty 30, 30d supply, fill #0

## 2024-04-02 NOTE — Patient Instructions (Addendum)
 Gina Shelton

## 2024-04-02 NOTE — Progress Notes (Signed)
 " [facility]  Gina Shelton, is a 49 y.o. female  RDW:[id number]  FMW:[id number]  DOB - 1975-12-17  Chief Complaint  Patient presents with   Back Pain    Cervical pain radiating down through the middle and goes to the lumbar  Pain has been going on for 2 months.  Pt states she takes ibuprofen  every 6 hours and now it's not helping     Circulatory Problem    Pt states her b/l hands and arms lose circulation when she is using them or holding them up or relaxing she has to hold them down for the circulation to come back    Dizziness    With lightheadness  Can't turn head fast enough  Feels tired like she hasn't been to sleep   Pt states she doesn't have any energy        Subjective:   Gina Shelton is a 49 y.o. female here today for an acute visit.  Back Pain This is a chronic problem. The current episode started more than 1 year ago. The problem occurs constantly. The problem has been gradually worsening since onset. The pain is present in the lumbar spine and thoracic spine. The quality of the pain is described as aching, stabbing and burning. The pain does not radiate. The pain is at a severity of 8/10. The pain is moderate. The pain is The same all the time. The symptoms are aggravated by bending, twisting, position and lying down. Stiffness is present All day. Associated symptoms include bladder incontinence, headaches, paresthesias and tingling. Risk factors include obesity. She has tried muscle relaxant, NSAIDs, heat and home exercises for the symptoms. The treatment provided mild relief.  Dizziness This is a new (positional) problem. The current episode started 1 to 4 weeks ago. The problem occurs intermittently. The problem has been gradually worsening. Associated symptoms include headaches and vertigo. The symptoms are aggravated by twisting and bending (decrease appetite). She has tried nothing for the symptoms.  Positional and has a history of anemia  will rule out Phelan test positive numbness and tingling going to her fingertips No problems updated.  Comprehensive ROS Pertinent positive and negative noted in HPI   Allergies[1]  Past Medical History:  Diagnosis Date   Anxiety    COPD (chronic obstructive pulmonary disease) (HCC)    Depression    Depression    Diabetes mellitus without complication (HCC)    GERD (gastroesophageal reflux disease)    Gout    ble   Hypertension    Migraine    Migraine     Medications Ordered Prior to Encounter[2] Health Maintenance  Topic Date Due   Hepatitis B Vaccine (1 of 3 - 19+ 3-dose series) Never done   Pneumococcal Vaccine (2 of 2 - PCV) 01/01/2017   Breast Cancer Screening  Never done   Pap with HPV screening  06/21/2022   Colon Cancer Screening  Never done   COVID-19 Vaccine (1 - 2025-26 season) Never done   Flu Shot  06/11/2024*   Kidney health urinalysis for diabetes  05/22/2024   Yearly kidney function blood test for diabetes  12/26/2024   DTaP/Tdap/Td vaccine (4 - Td or Tdap) 07/30/2025   HPV Vaccine (No Doses Required) Completed   Hepatitis C Screening  Completed   HIV Screening  Completed   Meningitis B Vaccine  Aged Out  *Topic was postponed. The date shown is not the original due date.    Objective:   Vitals:   04/02/24  1338  BP: 111/76  Pulse: 85  Resp: 16  SpO2: 98%  Weight: 193 lb 3.2 oz (87.6 kg)  Height: 5' 3 (1.6 m)   BP Readings from Last 3 Encounters:  04/02/24 111/76  12/27/23 115/79  12/26/23 114/76      Physical Exam Vitals reviewed.  Constitutional:      Appearance: Normal appearance. She is obese.  HENT:     Head: Normocephalic.     Right Ear: Tympanic membrane, ear canal and external ear normal.     Left Ear: Tympanic membrane, ear canal and external ear normal.     Nose: Nose normal.     Mouth/Throat:     Mouth: Mucous membranes are moist.  Eyes:     Extraocular Movements: Extraocular movements intact.     Pupils: Pupils are  equal, round, and reactive to light.  Cardiovascular:     Rate and Rhythm: Normal rate.  Pulmonary:     Effort: Pulmonary effort is normal.     Breath sounds: Normal breath sounds.  Abdominal:     General: Bowel sounds are normal.     Palpations: Abdomen is soft.  Musculoskeletal:        General: Normal range of motion.     Cervical back: Normal range of motion. Rigidity present.     Comments: Decreased range of motion bilateral upper pain elicited greater than 45 degrees  Skin:    General: Skin is warm and dry.  Neurological:     Mental Status: She is alert and oriented to person, place, and time.  Psychiatric:        Mood and Affect: Mood normal.        Behavior: Behavior normal.        Thought Content: Thought content normal.       Assessment & Plan   Gina Shelton was seen today for back pain, circulatory problem and dizziness.  Diagnoses and all orders for this visit:  Elevated glucose -     Lipid panel -     Hemoglobin A1c -     metFORMIN  (GLUCOPHAGE ) 500 MG tablet; Take 0.5 tablets (250 mg total) by mouth 2 (two) times daily.  Mixed simple and mucopurulent chronic bronchitis (HCC) -     albuterol  (VENTOLIN  HFA) 108 (90 Base) MCG/ACT inhaler; Inhale 2 puffs into the lungs every 4 (four) hours as needed for wheezing or shortness of breath.  Blepharoconjunctivitis of both eyes, unspecified blepharoconjunctivitis type -     ciprofloxacin  (CILOXAN ) 0.3 % ophthalmic solution; Administer 1 drop into both eyes, every 2 hours, while awake, for 2 days. Then 1 drop, every 4 hours, while awake, for the next 5 days.  Other acute sinusitis, recurrence not specified -     fluticasone  (FLONASE ) 50 MCG/ACT nasal spray; Place 2 sprays into both nostrils daily. -     levocetirizine (XYZAL  ALLERGY 24HR) 5 MG tablet; Take 1 tablet (5 mg total) by mouth every evening.  Essential hypertension Well-controlled -     hydrochlorothiazide  (HYDRODIURIL ) 25 MG tablet; Take 1 tablet (25 mg total)  by mouth daily.  Encounter for screening mammogram for malignant neoplasm of breast Mammogram  Colon cancer screening -     Ambulatory referral to Gastroenterology  Major depressive disorder, recurrent severe without psychotic features (HCC) -     sertraline  (ZOLOFT ) 50 MG tablet; Take 3 tablets (150 mg total) by mouth every morning.  Chronic bilateral low back pain without sciatica -     methocarbamol  (ROBAXIN )  500 MG tablet; Take 1 tablet (500 mg total) by mouth every morning. Referral to Ortho  Chronic bilateral thoracic back pain -     methocarbamol  (ROBAXIN ) 500 MG tablet; Take 1 tablet (500 mg total) by mouth every morning. Referral to Ortho  Gastroesophageal reflux disease without esophagitis -     esomeprazole  (NEXIUM ) 40 MG capsule; Take 1 capsule (40 mg total) by mouth daily.  Vitamin D  deficiency -     VITAMIN D  25 Hydroxy (Vit-D Deficiency, Fractures)  Neuropathy associated with endocrine disorder Numbness and tingling hands and feet -     gabapentin  (NEURONTIN ) 300 MG capsule; Take 1 capsule (300 mg total) by mouth 3 (three) times daily.  Chronic gout of multiple sites, unspecified cause -     Uric Acid  Anemia, unspecified type -     CBC with Differential/Platelet -     CMP14+EGFR -     Iron, TIBC and Ferritin Panel  Other orders -     Discontinue: colchicine  0.6 MG tablet; Take 1 tablet (0.6 mg total) by mouth daily as needed (gout pain). -     allopurinol  (ZYLOPRIM ) 100 MG tablet; Take 1 tablet (100 mg total) by mouth daily.     Patient have been counseled extensively about nutrition and exercise. Other issues discussed during this visit include: low cholesterol diet, weight control and daily exercise, foot care, annual eye examinations at Ophthalmology, importance of adherence with medications and regular follow-up. We also discussed long term complications of uncontrolled diabetes and hypertension.   Return in about 6 months (around 09/30/2024) for  fasting labs.  The patient was given clear instructions to go to ER or return to medical center if symptoms don't improve, worsen or new problems develop. The patient verbalized understanding. The patient was told to call to get lab results if they haven't heard anything in the next week.   This note has been created with Education officer, environmental. Any transcriptional errors are unintentional.   Gina SHAUNNA Bohr, NP 04/02/2024, 2:21 PM     [1] No Known Allergies [2]  Current Outpatient Medications on File Prior to Visit  Medication Sig Dispense Refill   Blood Pressure Monitor KIT 1 kit by Does not apply route 3 (three) times daily as needed. 1 kit 0   lidocaine  (LIDODERM ) 5 % Place 1 patch onto the skin daily. Remove & Discard patch within 12 hours or as directed by MD 30 patch 0   No current facility-administered medications on file prior to visit.   "

## 2024-04-03 ENCOUNTER — Other Ambulatory Visit: Payer: Self-pay

## 2024-04-03 LAB — LIPID PANEL

## 2024-04-04 ENCOUNTER — Ambulatory Visit: Payer: Self-pay | Admitting: Primary Care

## 2024-04-04 LAB — CMP14+EGFR
ALT: 11 IU/L (ref 0–32)
AST: 16 IU/L (ref 0–40)
Albumin: 4.4 g/dL (ref 3.9–4.9)
Alkaline Phosphatase: 139 IU/L — AB (ref 41–116)
BUN/Creatinine Ratio: 18 (ref 9–23)
BUN: 10 mg/dL (ref 6–24)
Bilirubin Total: <0.2 mg/dL (ref 0.0–1.2)
CO2: 23 mmol/L (ref 20–29)
Calcium: 9.5 mg/dL (ref 8.7–10.2)
Chloride: 101 mmol/L (ref 96–106)
Creatinine, Ser: 0.56 mg/dL — AB (ref 0.57–1.00)
Globulin, Total: 2.3 g/dL (ref 1.5–4.5)
Glucose: 64 mg/dL — AB (ref 70–99)
Potassium: 4.1 mmol/L (ref 3.5–5.2)
Sodium: 139 mmol/L (ref 134–144)
Total Protein: 6.7 g/dL (ref 6.0–8.5)
eGFR: 114 mL/min/1.73

## 2024-04-04 LAB — CBC WITH DIFFERENTIAL/PLATELET
Basophils Absolute: 0 x10E3/uL (ref 0.0–0.2)
Basos: 0 %
EOS (ABSOLUTE): 0.1 x10E3/uL (ref 0.0–0.4)
Eos: 1 %
Hematocrit: 39.9 % (ref 34.0–46.6)
Hemoglobin: 12.1 g/dL (ref 11.1–15.9)
Immature Grans (Abs): 0 x10E3/uL (ref 0.0–0.1)
Immature Granulocytes: 0 %
Lymphocytes Absolute: 2.7 x10E3/uL (ref 0.7–3.1)
Lymphs: 31 %
MCH: 26.1 pg — ABNORMAL LOW (ref 26.6–33.0)
MCHC: 30.3 g/dL — ABNORMAL LOW (ref 31.5–35.7)
MCV: 86 fL (ref 79–97)
Monocytes Absolute: 0.7 x10E3/uL (ref 0.1–0.9)
Monocytes: 8 %
Neutrophils Absolute: 5.1 x10E3/uL (ref 1.4–7.0)
Neutrophils: 60 %
Platelets: 364 x10E3/uL (ref 150–450)
RBC: 4.64 x10E6/uL (ref 3.77–5.28)
RDW: 15.4 % (ref 11.7–15.4)
WBC: 8.6 x10E3/uL (ref 3.4–10.8)

## 2024-04-04 LAB — LIPID PANEL
Cholesterol, Total: 199 mg/dL (ref 100–199)
HDL: 65 mg/dL
LDL CALC COMMENT:: 3.1 ratio (ref 0.0–4.4)
LDL Chol Calc (NIH): 110 mg/dL — AB (ref 0–99)
Triglycerides: 138 mg/dL (ref 0–149)
VLDL Cholesterol Cal: 24 mg/dL (ref 5–40)

## 2024-04-04 LAB — IRON,TIBC AND FERRITIN PANEL
Ferritin: 8 ng/mL — AB (ref 15–150)
Iron Saturation: 5 % — AB (ref 15–55)
Iron: 19 ug/dL — AB (ref 27–159)
Total Iron Binding Capacity: 400 ug/dL (ref 250–450)
UIBC: 381 ug/dL (ref 131–425)

## 2024-04-04 LAB — URIC ACID: Uric Acid: 3 mg/dL (ref 2.6–6.2)

## 2024-04-04 LAB — VITAMIN D 25 HYDROXY (VIT D DEFICIENCY, FRACTURES): Vit D, 25-Hydroxy: 9.2 ng/mL — ABNORMAL LOW (ref 30.0–100.0)

## 2024-04-04 LAB — HEMOGLOBIN A1C
Est. average glucose Bld gHb Est-mCnc: 114 mg/dL
Hgb A1c MFr Bld: 5.6 % (ref 4.8–5.6)

## 2024-04-10 ENCOUNTER — Ambulatory Visit: Admitting: Orthopaedic Surgery

## 2024-04-11 ENCOUNTER — Ambulatory Visit

## 2024-04-11 ENCOUNTER — Other Ambulatory Visit: Payer: Self-pay

## 2024-04-11 ENCOUNTER — Ambulatory Visit
Admission: RE | Admit: 2024-04-11 | Discharge: 2024-04-11 | Disposition: A | Discharge status: Home / Self Care | Payer: Self-pay | Source: Ambulatory Visit

## 2024-04-11 VITALS — BP 104/71 | HR 82 | Temp 98.4°F | Resp 16

## 2024-04-11 DIAGNOSIS — S40012A Contusion of left shoulder, initial encounter: Secondary | ICD-10-CM

## 2024-04-11 DIAGNOSIS — W009XXA Unspecified fall due to ice and snow, initial encounter: Secondary | ICD-10-CM

## 2024-04-11 DIAGNOSIS — S20229A Contusion of unspecified back wall of thorax, initial encounter: Secondary | ICD-10-CM

## 2024-04-11 DIAGNOSIS — S60221A Contusion of right hand, initial encounter: Secondary | ICD-10-CM

## 2024-04-11 MED ORDER — KETOROLAC TROMETHAMINE 30 MG/ML IJ SOLN
30.0000 mg | Freq: Once | INTRAMUSCULAR | Status: AC
  Administered 2024-04-11: 30 mg via INTRAMUSCULAR

## 2024-04-11 MED ORDER — DICLOFENAC SODIUM 75 MG PO TBEC
75.0000 mg | DELAYED_RELEASE_TABLET | Freq: Two times a day (BID) | ORAL | 0 refills | Status: AC
  Filled 2024-04-11: qty 30, 15d supply, fill #0

## 2024-04-11 MED ORDER — CYCLOBENZAPRINE HCL 5 MG PO TABS
5.0000 mg | ORAL_TABLET | Freq: Three times a day (TID) | ORAL | 0 refills | Status: AC | PRN
  Filled 2024-04-11: qty 30, 10d supply, fill #0

## 2024-04-11 MED ORDER — DEXAMETHASONE SOD PHOSPHATE PF 10 MG/ML IJ SOLN
10.0000 mg | Freq: Once | INTRAMUSCULAR | Status: AC
  Administered 2024-04-11: 10 mg via INTRAMUSCULAR

## 2024-04-11 NOTE — Discharge Instructions (Addendum)
 Preliminary review of xray shows no fracture, if radiologist sees something different we will call and let you know.   Will send a muscle relaxer and NSAID to pharmacy for pain.   Today you have been diagnosed with a musculoskeletal injury.   You should use ice on affected area for 20 minutes at a time a couple times a day for the first 24 hours then you may switch to heat in the same intervals.  Be sure to put a barrier between ice or heat source and skin to prevent burns.  May also wrap affected area and Ace bandage if tolerated and appropriate, and elevate above the level of the heart to help reduce swelling.  Do not wrap Ace bandages around neck or torso as wrapping too tight can restrict air movement inability to breathe.  If symptoms do not seem to be improving in 3 to 5 days after following these instructions we need to follow-up with orthopedist or PCP.

## 2024-04-11 NOTE — ED Provider Notes (Signed)
 " [name]    CSN: [id number] Arrival date & time: 04/11/24  1007      History   Chief Complaint Chief Complaint  Patient presents with   Wrist Pain   Fall    HPI Gina Shelton is a 49 y.o. female.   Pt presents today due to slip and fall on ice yesterday. Pt states that she is experiencing 8/10 pain of right hand, left shoulder, and back. Pt states that she took 800 mg of ibuprofen  at 1:30 am with little relief.   The history is provided by the patient.  Wrist Pain  Fall    Past Medical History:  Diagnosis Date   Anxiety    COPD (chronic obstructive pulmonary disease) (HCC)    Depression    Depression    Diabetes mellitus without complication (HCC)    GERD (gastroesophageal reflux disease)    Gout    ble   Hypertension    Migraine    Migraine     Patient Active Problem List   Diagnosis Date Noted   Pain in both feet 02/26/2023   Class 3 severe obesity due to excess calories with serious comorbidity and body mass index (BMI) of 40.0 to 44.9 in adult (HCC) 02/26/2023   Achilles tendon disorder 02/26/2023   Low back pain 04/22/2021   Chronic pain of left ankle 10/21/2020   Left foot pain 10/21/2020   COPD exacerbation (HCC) 09/10/2019   Chest pain 09/27/2017   Polysubstance abuse (HCC) 09/27/2017   Hypertensive urgency 09/26/2017   Obesity (BMI 30.0-34.9) 05/22/2017   Ovarian cyst 05/22/2017   Cavernous hemangioma of liver 05/22/2017   Major depressive disorder, single episode, severe without psychosis (HCC) 12/31/2015   Major depressive disorder, recurrent severe without psychotic features (HCC) 12/31/2015   Alcohol  dependence (HCC) 06/07/2013   Cocaine abuse (HCC) 06/07/2013   DEPRESSION, MAJOR, RECURRENT 08/17/2007   ANXIETY STATE NOS 11/15/2006    Past Surgical History:  Procedure Laterality Date   CESAREAN SECTION  infection at incission requiring return to OR x 2   TUBAL LIGATION      OB History     Gravida  3   Para   3   Term  0   Preterm  0   AB  0   Living         SAB  0   IAB  0   Ectopic  0   Multiple      Live Births               Home Medications    Prior to Admission medications  Medication Sig Start Date End Date Taking? Authorizing Provider  albuterol  (VENTOLIN  HFA) 108 (90 Base) MCG/ACT inhaler Inhale 2 puffs into the lungs every 4 (four) hours as needed for wheezing or shortness of breath. 04/02/24  Yes Celestia Rosaline SQUIBB, NP  allopurinol  (ZYLOPRIM ) 100 MG tablet Take 1 tablet (100 mg total) by mouth daily. 04/02/24  Yes Celestia Rosaline SQUIBB, NP  ciprofloxacin  (CILOXAN ) 0.3 % ophthalmic solution Administer 1 drop into both eyes, every 2 hours, while awake, for 2 days. Then 1 drop, every 4 hours, while awake, for the next 5 days. 04/02/24  Yes Celestia Rosaline SQUIBB, NP  cyclobenzaprine  (FLEXERIL ) 5 MG tablet Take 1 tablet (5 mg total) by mouth 3 (three) times daily as needed. 04/11/24  Yes Andra Corean BROCKS, PA-C  diclofenac  (VOLTAREN ) 75 MG EC tablet Take 1 tablet (75 mg total) by mouth  2 (two) times daily. 04/11/24  Yes Andra Krabbe C, PA-C  esomeprazole  (NEXIUM ) 40 MG capsule Take 1 capsule (40 mg total) by mouth daily. 04/02/24  Yes Celestia Rosaline SQUIBB, NP  fluticasone  (FLONASE ) 50 MCG/ACT nasal spray Place 2 sprays into both nostrils daily. 04/02/24  Yes Celestia Rosaline SQUIBB, NP  gabapentin  (NEURONTIN ) 300 MG capsule Take 1 capsule (300 mg total) by mouth 3 (three) times daily. 04/02/24  Yes Celestia Rosaline SQUIBB, NP  hydrochlorothiazide  (HYDRODIURIL ) 25 MG tablet Take 1 tablet (25 mg total) by mouth daily. 04/02/24  Yes Celestia Rosaline SQUIBB, NP  levocetirizine (XYZAL  ALLERGY 24HR) 5 MG tablet Take 1 tablet (5 mg total) by mouth every evening. 04/02/24  Yes Celestia Rosaline SQUIBB, NP  metFORMIN  (GLUCOPHAGE ) 500 MG tablet Take 0.5 tablets (250 mg total) by mouth 2 (two) times daily. 04/02/24  Yes Celestia Rosaline SQUIBB, NP  methocarbamol  (ROBAXIN ) 500 MG tablet Take 1 tablet (500 mg  total) by mouth every morning. 04/02/24  Yes Celestia Rosaline SQUIBB, NP  sertraline  (ZOLOFT ) 50 MG tablet Take 3 tablets (150 mg total) by mouth every morning. 04/02/24  Yes Celestia Rosaline SQUIBB, NP  Blood Pressure Monitor KIT 1 kit by Does not apply route 3 (three) times daily as needed. 04/04/19   Celestia Rosaline SQUIBB, NP  lidocaine  (LIDODERM ) 5 % Place 1 patch onto the skin daily. Remove & Discard patch within 12 hours or as directed by MD Patient not taking: Reported on 04/11/2024 12/27/23   Glendia Rocky SAILOR, PA-C    Family History Family History  Problem Relation Age of Onset   Hypertension Mother    Arthritis Sister    Anesthesia problems Neg Hx    Hypotension Neg Hx    Malignant hyperthermia Neg Hx    Pseudochol deficiency Neg Hx    Neuropathy Neg Hx     Social History Social History[1]   Allergies   Patient has no known allergies.   Review of Systems Review of Systems   Physical Exam Triage Vital Signs ED Triage Vitals  Encounter Vitals Group     BP 04/11/24 1043 96/68     Girls Systolic BP Percentile --      Girls Diastolic BP Percentile --      Boys Systolic BP Percentile --      Boys Diastolic BP Percentile --      Pulse Rate 04/11/24 1043 80     Resp 04/11/24 1043 16     Temp 04/11/24 1043 98.4 F (36.9 C)     Temp Source 04/11/24 1043 Oral     SpO2 04/11/24 1043 97 %     Weight --      Height --      Head Circumference --      Peak Flow --      Pain Score 04/11/24 1040 8     Pain Loc --      Pain Education --      Exclude from Growth Chart --    No data found.  Updated Vital Signs BP 104/71 (BP Location: Right Arm)   Pulse 82   Temp 98.4 F (36.9 C) (Oral)   Resp 16   LMP 02/20/2024   SpO2 97%   Visual Acuity Right Eye Distance:   Left Eye Distance:   Bilateral Distance:    Right Eye Near:   Left Eye Near:    Bilateral Near:     Physical Exam Vitals and nursing note reviewed.  Constitutional:  General: She is not in acute distress.     Appearance: Normal appearance. She is not ill-appearing, toxic-appearing or diaphoretic.  Eyes:     General: No scleral icterus. Cardiovascular:     Rate and Rhythm: Normal rate and regular rhythm.     Heart sounds: Normal heart sounds.  Pulmonary:     Effort: Pulmonary effort is normal. No respiratory distress.     Breath sounds: Normal breath sounds. No wheezing or rhonchi.  Musculoskeletal:     Right shoulder: Tenderness present. Normal range of motion.     Left shoulder: Tenderness present. Decreased range of motion.     Right hand: Tenderness present. No swelling, deformity or lacerations. Decreased range of motion.       Arms:     Comments: Unable to lift left arm past 90 degrees  Tenderness to palpation of back diffusely Pain elicited with all active rom  Skin:    General: Skin is warm.  Neurological:     Mental Status: She is alert and oriented to person, place, and time.  Psychiatric:        Mood and Affect: Mood normal.        Behavior: Behavior normal.      UC Treatments / Results  Labs (all labs ordered are listed, but only abnormal results are displayed) Labs Reviewed - No data to display  EKG   Radiology No results found.  Procedures Procedures (including critical care time)  Medications Ordered in UC Medications  ketorolac  (TORADOL ) 30 MG/ML injection 30 mg (30 mg Intramuscular Given 04/11/24 1217)  dexamethasone  (DECADRON ) injection 10 mg (10 mg Intramuscular Given 04/11/24 1219)    Initial Impression / Assessment and Plan / UC Course  I have reviewed the triage vital signs and the nursing notes.  Pertinent labs & imaging results that were available during my care of the patient were reviewed by me and considered in my medical decision making (see chart for details).      Final Clinical Impressions(s) / UC Diagnoses   Final diagnoses:  Fall due to slipping on ice or snow, initial encounter  Contusion of back, unspecified laterality, initial  encounter  Contusion of right hand, initial encounter  Contusion of left shoulder, initial encounter     Discharge Instructions      Preliminary review of xray shows no fracture, if radiologist sees something different we will call and let you know.   Will send a muscle relaxer and NSAID to pharmacy for pain.   Today you have been diagnosed with a musculoskeletal injury.   You should use ice on affected area for 20 minutes at a time a couple times a day for the first 24 hours then you may switch to heat in the same intervals.  Be sure to put a barrier between ice or heat source and skin to prevent burns.  May also wrap affected area and Ace bandage if tolerated and appropriate, and elevate above the level of the heart to help reduce swelling.  Do not wrap Ace bandages around neck or torso as wrapping too tight can restrict air movement inability to breathe.  If symptoms do not seem to be improving in 3 to 5 days after following these instructions we need to follow-up with orthopedist or PCP.      ED Prescriptions     Medication Sig Dispense Auth. Provider   cyclobenzaprine  (FLEXERIL ) 5 MG tablet Take 1 tablet (5 mg total) by mouth 3 (three) times daily as  needed. 30 tablet Andra Krabbe C, PA-C   diclofenac  (VOLTAREN ) 75 MG EC tablet Take 1 tablet (75 mg total) by mouth 2 (two) times daily. 30 tablet Andra Krabbe BROCKS, PA-C      PDMP not reviewed this encounter.     [1]  Social History Tobacco Use   Smoking status: Every Day    Current packs/day: 1.00    Types: Cigarettes    Passive exposure: Current   Smokeless tobacco: Never  Vaping Use   Vaping status: Never Used  Substance Use Topics   Alcohol  use: Not Currently    Comment: pt states no beer for 1 year and seven months   Drug use: No     Andra Krabbe BROCKS, PA-C 04/11/24 1241  "

## 2024-04-11 NOTE — ED Triage Notes (Signed)
 Back arm and wrist pain fell on ice/snow - Entered by patient  States she fell yesterday going down a hill. Caught herself with right hand. Pain is in right thumb and index finger and radiates into wrist. Also c/o pain left hip, back and shoulder on left side. She has taken ibuprofen  with no relief, last dose was 0130

## 2024-04-12 ENCOUNTER — Other Ambulatory Visit: Payer: Self-pay

## 2024-04-18 ENCOUNTER — Ambulatory Visit: Admitting: Physical Medicine and Rehabilitation

## 2024-04-18 NOTE — Telephone Encounter (Signed)
 Will forward to provider

## 2024-09-30 ENCOUNTER — Ambulatory Visit (INDEPENDENT_AMBULATORY_CARE_PROVIDER_SITE_OTHER): Payer: Self-pay | Admitting: Primary Care
